# Patient Record
Sex: Male | Born: 1935 | Race: Black or African American | Hispanic: No | Marital: Single | Smoking: Former smoker
Health system: Southern US, Community
[De-identification: ages and names within clinical notes are randomized; demographics above are authoritative.]

## PROBLEM LIST (undated history)

## (undated) DIAGNOSIS — I739 Peripheral vascular disease, unspecified: Secondary | ICD-10-CM

## (undated) DIAGNOSIS — I10 Essential (primary) hypertension: Secondary | ICD-10-CM

## (undated) DIAGNOSIS — I509 Heart failure, unspecified: Secondary | ICD-10-CM

## (undated) DIAGNOSIS — I4891 Unspecified atrial fibrillation: Secondary | ICD-10-CM

## (undated) DIAGNOSIS — I499 Cardiac arrhythmia, unspecified: Secondary | ICD-10-CM

## (undated) DIAGNOSIS — M109 Gout, unspecified: Secondary | ICD-10-CM

## (undated) DIAGNOSIS — N186 End stage renal disease: Secondary | ICD-10-CM

## (undated) DIAGNOSIS — I639 Cerebral infarction, unspecified: Secondary | ICD-10-CM

## (undated) DIAGNOSIS — H409 Unspecified glaucoma: Secondary | ICD-10-CM

## (undated) DIAGNOSIS — I2510 Atherosclerotic heart disease of native coronary artery without angina pectoris: Secondary | ICD-10-CM

## (undated) DIAGNOSIS — D649 Anemia, unspecified: Secondary | ICD-10-CM

## (undated) DIAGNOSIS — H5440 Blindness, one eye, unspecified eye: Secondary | ICD-10-CM

## (undated) DIAGNOSIS — G629 Polyneuropathy, unspecified: Secondary | ICD-10-CM

## (undated) HISTORY — PX: EYE SURGERY: SHX253

## (undated) HISTORY — PX: VASCULAR SURGERY: SHX849

---
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
## 2006-10-05 ENCOUNTER — Other Ambulatory Visit: Payer: Self-pay

## 2006-10-05 ENCOUNTER — Emergency Department: Payer: Self-pay

## 2006-10-16 ENCOUNTER — Ambulatory Visit: Payer: Self-pay | Admitting: General Surgery

## 2006-10-21 ENCOUNTER — Ambulatory Visit: Payer: Self-pay | Admitting: Family Medicine

## 2006-10-31 ENCOUNTER — Ambulatory Visit: Payer: Self-pay | Admitting: Family Medicine

## 2006-11-26 ENCOUNTER — Ambulatory Visit: Payer: Self-pay | Admitting: Vascular Surgery

## 2006-12-03 ENCOUNTER — Inpatient Hospital Stay: Payer: Self-pay | Admitting: Vascular Surgery

## 2006-12-10 ENCOUNTER — Inpatient Hospital Stay: Payer: Self-pay | Admitting: Vascular Surgery

## 2007-02-03 ENCOUNTER — Ambulatory Visit: Payer: Self-pay | Admitting: Vascular Surgery

## 2007-02-12 ENCOUNTER — Inpatient Hospital Stay: Payer: Self-pay | Admitting: Vascular Surgery

## 2007-05-10 ENCOUNTER — Other Ambulatory Visit: Payer: Self-pay

## 2007-05-10 ENCOUNTER — Inpatient Hospital Stay: Payer: Self-pay | Admitting: Internal Medicine

## 2007-12-08 ENCOUNTER — Other Ambulatory Visit: Payer: Self-pay

## 2007-12-09 ENCOUNTER — Inpatient Hospital Stay: Payer: Self-pay | Admitting: Internal Medicine

## 2007-12-22 ENCOUNTER — Emergency Department: Payer: Self-pay | Admitting: Emergency Medicine

## 2007-12-22 ENCOUNTER — Other Ambulatory Visit: Payer: Self-pay

## 2008-12-31 ENCOUNTER — Inpatient Hospital Stay: Payer: Self-pay | Admitting: Internal Medicine

## 2011-05-10 ENCOUNTER — Emergency Department: Payer: Self-pay | Admitting: Internal Medicine

## 2011-05-15 ENCOUNTER — Emergency Department: Payer: Self-pay | Admitting: *Deleted

## 2013-08-27 ENCOUNTER — Ambulatory Visit: Payer: Self-pay | Admitting: Family

## 2013-12-22 ENCOUNTER — Ambulatory Visit: Payer: Self-pay | Admitting: Ophthalmology

## 2014-01-31 ENCOUNTER — Telehealth: Payer: Self-pay | Admitting: Medical Oncology

## 2014-01-31 NOTE — Telephone Encounter (Signed)
error 

## 2015-03-04 ENCOUNTER — Encounter: Payer: Self-pay | Admitting: Emergency Medicine

## 2015-03-04 ENCOUNTER — Emergency Department: Payer: Medicare (Managed Care)

## 2015-03-04 ENCOUNTER — Inpatient Hospital Stay
Admission: EM | Admit: 2015-03-04 | Discharge: 2015-03-11 | DRG: 291 | Disposition: A | Discharge status: Skilled Nursing Facility | Payer: Medicare (Managed Care) | Attending: Internal Medicine | Admitting: Internal Medicine

## 2015-03-04 DIAGNOSIS — E669 Obesity, unspecified: Secondary | ICD-10-CM | POA: Diagnosis present

## 2015-03-04 DIAGNOSIS — N184 Chronic kidney disease, stage 4 (severe): Secondary | ICD-10-CM | POA: Diagnosis present

## 2015-03-04 DIAGNOSIS — I129 Hypertensive chronic kidney disease with stage 1 through stage 4 chronic kidney disease, or unspecified chronic kidney disease: Secondary | ICD-10-CM | POA: Diagnosis present

## 2015-03-04 DIAGNOSIS — E785 Hyperlipidemia, unspecified: Secondary | ICD-10-CM | POA: Diagnosis present

## 2015-03-04 DIAGNOSIS — H409 Unspecified glaucoma: Secondary | ICD-10-CM | POA: Diagnosis present

## 2015-03-04 DIAGNOSIS — Z66 Do not resuscitate: Secondary | ICD-10-CM | POA: Diagnosis present

## 2015-03-04 DIAGNOSIS — N179 Acute kidney failure, unspecified: Secondary | ICD-10-CM | POA: Diagnosis present

## 2015-03-04 DIAGNOSIS — M1A9XX0 Chronic gout, unspecified, without tophus (tophi): Secondary | ICD-10-CM | POA: Diagnosis present

## 2015-03-04 DIAGNOSIS — R079 Chest pain, unspecified: Secondary | ICD-10-CM

## 2015-03-04 DIAGNOSIS — D631 Anemia in chronic kidney disease: Secondary | ICD-10-CM | POA: Diagnosis present

## 2015-03-04 DIAGNOSIS — Z8673 Personal history of transient ischemic attack (TIA), and cerebral infarction without residual deficits: Secondary | ICD-10-CM

## 2015-03-04 DIAGNOSIS — J9601 Acute respiratory failure with hypoxia: Secondary | ICD-10-CM | POA: Diagnosis present

## 2015-03-04 DIAGNOSIS — Z7952 Long term (current) use of systemic steroids: Secondary | ICD-10-CM | POA: Diagnosis not present

## 2015-03-04 DIAGNOSIS — I509 Heart failure, unspecified: Secondary | ICD-10-CM

## 2015-03-04 DIAGNOSIS — J189 Pneumonia, unspecified organism: Secondary | ICD-10-CM | POA: Diagnosis not present

## 2015-03-04 DIAGNOSIS — Z79899 Other long term (current) drug therapy: Secondary | ICD-10-CM | POA: Diagnosis not present

## 2015-03-04 DIAGNOSIS — Z87891 Personal history of nicotine dependence: Secondary | ICD-10-CM

## 2015-03-04 DIAGNOSIS — I5033 Acute on chronic diastolic (congestive) heart failure: Principal | ICD-10-CM | POA: Diagnosis present

## 2015-03-04 DIAGNOSIS — E872 Acidosis: Secondary | ICD-10-CM | POA: Diagnosis present

## 2015-03-04 DIAGNOSIS — Z6830 Body mass index (BMI) 30.0-30.9, adult: Secondary | ICD-10-CM | POA: Diagnosis not present

## 2015-03-04 DIAGNOSIS — Z7982 Long term (current) use of aspirin: Secondary | ICD-10-CM | POA: Diagnosis not present

## 2015-03-04 DIAGNOSIS — G629 Polyneuropathy, unspecified: Secondary | ICD-10-CM | POA: Diagnosis present

## 2015-03-04 DIAGNOSIS — Z7902 Long term (current) use of antithrombotics/antiplatelets: Secondary | ICD-10-CM

## 2015-03-04 DIAGNOSIS — R0902 Hypoxemia: Secondary | ICD-10-CM

## 2015-03-04 DIAGNOSIS — I2510 Atherosclerotic heart disease of native coronary artery without angina pectoris: Secondary | ICD-10-CM | POA: Diagnosis present

## 2015-03-04 HISTORY — DX: Heart failure, unspecified: I50.9

## 2015-03-04 HISTORY — DX: Essential (primary) hypertension: I10

## 2015-03-04 HISTORY — DX: Polyneuropathy, unspecified: G62.9

## 2015-03-04 HISTORY — DX: Gout, unspecified: M10.9

## 2015-03-04 HISTORY — DX: Unspecified glaucoma: H40.9

## 2015-03-04 LAB — COMPREHENSIVE METABOLIC PANEL
ALK PHOS: 89 U/L (ref 38–126)
ALT: 20 U/L (ref 17–63)
ANION GAP: 10 (ref 5–15)
AST: 28 U/L (ref 15–41)
Albumin: 3.4 g/dL — ABNORMAL LOW (ref 3.5–5.0)
BILIRUBIN TOTAL: 0.8 mg/dL (ref 0.3–1.2)
BUN: 51 mg/dL — ABNORMAL HIGH (ref 6–20)
CALCIUM: 8.5 mg/dL — AB (ref 8.9–10.3)
CO2: 25 mmol/L (ref 22–32)
CREATININE: 4.24 mg/dL — AB (ref 0.61–1.24)
Chloride: 108 mmol/L (ref 101–111)
GFR, EST AFRICAN AMERICAN: 14 mL/min — AB (ref >60)
GFR, EST NON AFRICAN AMERICAN: 12 mL/min — AB (ref >60)
Glucose, Bld: 116 mg/dL — ABNORMAL HIGH (ref 65–99)
Potassium: 4.9 mmol/L (ref 3.5–5.1)
Sodium: 143 mmol/L (ref 135–145)
TOTAL PROTEIN: 7.9 g/dL (ref 6.5–8.1)

## 2015-03-04 LAB — CBC WITH DIFFERENTIAL/PLATELET
BASOS ABS: 0.1 10*3/uL (ref 0–0.1)
BASOS PCT: 2 %
EOS ABS: 0.3 10*3/uL (ref 0–0.7)
EOS PCT: 3 %
HCT: 35.4 % — ABNORMAL LOW (ref 40.0–52.0)
Hemoglobin: 11.2 g/dL — ABNORMAL LOW (ref 13.0–18.0)
LYMPHS ABS: 0.7 10*3/uL — AB (ref 1.0–3.6)
Lymphocytes Relative: 9 %
MCH: 28.6 pg (ref 26.0–34.0)
MCHC: 31.6 g/dL — ABNORMAL LOW (ref 32.0–36.0)
MCV: 90.5 fL (ref 80.0–100.0)
Monocytes Absolute: 0.4 10*3/uL (ref 0.2–1.0)
Monocytes Relative: 6 %
Neutro Abs: 6.2 10*3/uL (ref 1.4–6.5)
Neutrophils Relative %: 80 %
PLATELETS: 167 10*3/uL (ref 150–440)
RBC: 3.91 MIL/uL — AB (ref 4.40–5.90)
RDW: 17 % — ABNORMAL HIGH (ref 11.5–14.5)
WBC: 7.7 10*3/uL (ref 3.8–10.6)

## 2015-03-04 LAB — BLOOD GAS, VENOUS
Acid-base deficit: 0.1 mmol/L (ref 0.0–2.0)
BICARBONATE: 26.8 meq/L (ref 21.0–28.0)
FIO2: 0.21
O2 Saturation: 65.1 %
PATIENT TEMPERATURE: 37
pCO2, Ven: 52 mmHg (ref 44.0–60.0)
pH, Ven: 7.32 (ref 7.320–7.430)
pO2, Ven: 37 mmHg (ref 30.0–45.0)

## 2015-03-04 LAB — TROPONIN I: Troponin I: <0.03 ng/mL (ref <0.031)

## 2015-03-04 LAB — LACTIC ACID, PLASMA: LACTIC ACID, VENOUS: 0.8 mmol/L (ref 0.5–2.0)

## 2015-03-04 LAB — BRAIN NATRIURETIC PEPTIDE: B NATRIURETIC PEPTIDE 5: 560 pg/mL — AB (ref 0.0–100.0)

## 2015-03-04 MED ORDER — CLOPIDOGREL BISULFATE 75 MG PO TABS
75.0000 mg | ORAL_TABLET | Freq: Every day | ORAL | Status: DC
  Administered 2015-03-05 – 2015-03-11 (×7): 75 mg via ORAL
  Filled 2015-03-04 (×7): qty 1

## 2015-03-04 MED ORDER — ASPIRIN EC 81 MG PO TBEC
81.0000 mg | DELAYED_RELEASE_TABLET | Freq: Every day | ORAL | Status: DC
  Administered 2015-03-05 – 2015-03-11 (×7): 81 mg via ORAL
  Filled 2015-03-04 (×7): qty 1

## 2015-03-04 MED ORDER — FERROUS SULFATE 325 (65 FE) MG PO TABS
325.0000 mg | ORAL_TABLET | Freq: Two times a day (BID) | ORAL | Status: DC
  Administered 2015-03-04 – 2015-03-11 (×14): 325 mg via ORAL
  Filled 2015-03-04 (×15): qty 1

## 2015-03-04 MED ORDER — ALLOPURINOL 100 MG PO TABS
100.0000 mg | ORAL_TABLET | Freq: Every day | ORAL | Status: DC
  Administered 2015-03-05 – 2015-03-11 (×7): 100 mg via ORAL
  Filled 2015-03-04 (×7): qty 1

## 2015-03-04 MED ORDER — TOLNAFTATE 1 % EX AERP: 1.0000 "application " | INHALATION_SPRAY | Freq: Every day | CUTANEOUS | Status: DC

## 2015-03-04 MED ORDER — AMLODIPINE-ATORVASTATIN 10-20 MG PO TABS: 1.0000 | ORAL_TABLET | Freq: Every day | ORAL | Status: DC

## 2015-03-04 MED ORDER — ONDANSETRON HCL 4 MG PO TABS: 4.0000 mg | ORAL_TABLET | Freq: Four times a day (QID) | ORAL | Status: DC | PRN

## 2015-03-04 MED ORDER — ONDANSETRON HCL 4 MG/2ML IJ SOLN: 4.0000 mg | Freq: Four times a day (QID) | INTRAMUSCULAR | Status: DC | PRN

## 2015-03-04 MED ORDER — ATORVASTATIN CALCIUM 20 MG PO TABS
20.0000 mg | ORAL_TABLET | Freq: Every day | ORAL | Status: DC
  Administered 2015-03-04 – 2015-03-05 (×2): 20 mg via ORAL
  Filled 2015-03-04 (×2): qty 1

## 2015-03-04 MED ORDER — HEPARIN SODIUM (PORCINE) 5000 UNIT/ML IJ SOLN
5000.0000 [IU] | Freq: Three times a day (TID) | INTRAMUSCULAR | Status: DC
  Administered 2015-03-04 – 2015-03-11 (×21): 5000 [IU] via SUBCUTANEOUS
  Filled 2015-03-04 (×20): qty 1

## 2015-03-04 MED ORDER — PREGABALIN 75 MG PO CAPS
75.0000 mg | ORAL_CAPSULE | Freq: Two times a day (BID) | ORAL | Status: DC
  Administered 2015-03-04 – 2015-03-11 (×14): 75 mg via ORAL
  Filled 2015-03-04 (×14): qty 1

## 2015-03-04 MED ORDER — CETYLPYRIDINIUM CHLORIDE 0.05 % MT LIQD
7.0000 mL | Freq: Two times a day (BID) | OROMUCOSAL | Status: DC
  Administered 2015-03-04 – 2015-03-11 (×9): 7 mL via OROMUCOSAL

## 2015-03-04 MED ORDER — ADULT MULTIVITAMIN W/MINERALS CH
ORAL_TABLET | Freq: Every day | ORAL | Status: DC
  Administered 2015-03-05 – 2015-03-11 (×7): 1 via ORAL
  Filled 2015-03-04 (×7): qty 1

## 2015-03-04 MED ORDER — METOPROLOL SUCCINATE ER 100 MG PO TB24
200.0000 mg | ORAL_TABLET | Freq: Every day | ORAL | Status: DC
  Administered 2015-03-05 – 2015-03-11 (×7): 200 mg via ORAL
  Filled 2015-03-04 (×7): qty 2

## 2015-03-04 MED ORDER — DEXTROSE 5 % IV SOLN
1.0000 g | Freq: Once | INTRAVENOUS | Status: AC
  Administered 2015-03-04: 1 g via INTRAVENOUS
  Filled 2015-03-04: qty 10

## 2015-03-04 MED ORDER — FUROSEMIDE 10 MG/ML IJ SOLN
60.0000 mg | Freq: Two times a day (BID) | INTRAMUSCULAR | Status: DC
  Administered 2015-03-04 – 2015-03-06 (×4): 60 mg via INTRAVENOUS
  Filled 2015-03-04 (×4): qty 6

## 2015-03-04 MED ORDER — ISOSORBIDE MONONITRATE ER 60 MG PO TB24
60.0000 mg | ORAL_TABLET | Freq: Every day | ORAL | Status: DC
  Administered 2015-03-05 – 2015-03-11 (×7): 60 mg via ORAL
  Filled 2015-03-04 (×7): qty 1

## 2015-03-04 MED ORDER — ACETAMINOPHEN 325 MG PO TABS: 650.0000 mg | ORAL_TABLET | Freq: Four times a day (QID) | ORAL | Status: DC | PRN

## 2015-03-04 MED ORDER — SENNOSIDES-DOCUSATE SODIUM 8.6-50 MG PO TABS
2.0000 | ORAL_TABLET | Freq: Every day | ORAL | Status: DC
  Administered 2015-03-05 – 2015-03-10 (×6): 2 via ORAL
  Filled 2015-03-04 (×7): qty 2

## 2015-03-04 MED ORDER — TROLAMINE SALICYLATE 10 % EX CREA
1.0000 "application " | TOPICAL_CREAM | Freq: Four times a day (QID) | CUTANEOUS | Status: DC
  Administered 2015-03-07 – 2015-03-08 (×3): 1 via TOPICAL
  Filled 2015-03-04: qty 85

## 2015-03-04 MED ORDER — ACETAMINOPHEN 650 MG RE SUPP: 650.0000 mg | Freq: Four times a day (QID) | RECTAL | Status: DC | PRN

## 2015-03-04 MED ORDER — DEXTROSE 5 % IV SOLN
500.0000 mg | Freq: Once | INTRAVENOUS | Status: AC
  Administered 2015-03-04: 500 mg via INTRAVENOUS
  Filled 2015-03-04: qty 500

## 2015-03-04 MED ORDER — FUROSEMIDE 10 MG/ML IJ SOLN
40.0000 mg | Freq: Once | INTRAMUSCULAR | Status: AC
  Administered 2015-03-04: 40 mg via INTRAVENOUS
  Filled 2015-03-04: qty 4

## 2015-03-04 MED ORDER — LATANOPROST 0.005 % OP SOLN
1.0000 [drp] | Freq: Every day | OPHTHALMIC | Status: DC
  Administered 2015-03-04 – 2015-03-10 (×7): 1 [drp] via OPHTHALMIC
  Filled 2015-03-04: qty 2.5

## 2015-03-04 MED ORDER — SODIUM CHLORIDE 0.9 % IJ SOLN
3.0000 mL | Freq: Two times a day (BID) | INTRAMUSCULAR | Status: DC
  Administered 2015-03-04 – 2015-03-11 (×14): 3 mL via INTRAVENOUS

## 2015-03-04 MED ORDER — AMLODIPINE BESYLATE 10 MG PO TABS
10.0000 mg | ORAL_TABLET | Freq: Every day | ORAL | Status: DC
  Administered 2015-03-05: 10 mg via ORAL
  Filled 2015-03-04: qty 1

## 2015-03-04 MED ORDER — SODIUM BICARBONATE 650 MG PO TABS
650.0000 mg | ORAL_TABLET | Freq: Two times a day (BID) | ORAL | Status: DC
  Administered 2015-03-04 – 2015-03-11 (×14): 650 mg via ORAL
  Filled 2015-03-04 (×15): qty 1

## 2015-03-04 MED ORDER — HYDRALAZINE HCL 25 MG PO TABS
75.0000 mg | ORAL_TABLET | Freq: Three times a day (TID) | ORAL | Status: DC
  Administered 2015-03-04 – 2015-03-11 (×21): 75 mg via ORAL
  Filled 2015-03-04 (×21): qty 1

## 2015-03-04 MED ORDER — BRIMONIDINE TARTRATE 0.2 % OP SOLN
1.0000 [drp] | Freq: Two times a day (BID) | OPHTHALMIC | Status: DC
  Administered 2015-03-04 – 2015-03-11 (×14): 1 [drp] via OPHTHALMIC
  Filled 2015-03-04: qty 5

## 2015-03-04 NOTE — Progress Notes (Signed)
Patient admitted today from home alone. Is [facility] by [facility] where he goes every Monday and Friday, and also has nurses aides come to his house. Per patient's sister he will soon need to be on dialysis due to CKD. Patient is alert and oriented, but has some memory impairment. Sinus tach in low 100's with PVC's on tele. On 5L of oxygen currently sating around 92%. No complaints of pain. Heavy assist to stand to [facility], will need PT eval. Will continue to monitor.

## 2015-03-04 NOTE — H&P (Signed)
[facility] - [place] at [facility]   PATIENT NAME: Sean Delgado    MR#:  [id number]  DATE OF BIRTH:  [birth year]  DATE OF ADMISSION:  03/04/2015  PRIMARY CARE PHYSICIAN: Bobbye Morton, MD   REQUESTING/REFERRING PHYSICIAN: Dr. Governor Rooks  CHIEF COMPLAINT:   Chief Complaint  Patient presents with  . Shortness of Breath    HISTORY OF PRESENT ILLNESS:  Sean Delgado  is a 81 y.o. male with a known history of CAD daily stage IV, hypertension, history of CHF, gout, glaucoma, peripheral neuropathy, who presents to the hospital due to shortness of breath and worsening lower extremity edema. He was followed by the [facility] and presents to the hospital due to the above complaint. Patient says he develop shortness of breath yesterday and has progressively gotten worse. He admits to a cough which is nonproductive. He denies any fever, chills, nausea, vomiting. He denies any chest pain, abdominal pain or any other associated symptoms presently. Patient presented to the ER was noted to be hypoxic with O2 sats in the low 90s to high 80s. Patient was noted to be volume overloaded noted to be in mild CHF and hospitalist services were contacted further treatment and evaluation.  PAST MEDICAL HISTORY:   Past Medical History  Diagnosis Date  . Chronic renal failure, stage 4 (severe)   . Hypertension   . CHF (congestive heart failure)   . Gout   . Glaucoma   . Neuropathy     PAST SURGICAL HISTORY:  History reviewed. No pertinent past surgical history.  SOCIAL HISTORY:   Social History  Substance Use Topics  . Smoking status: Former Smoker -- 0.25 packs/day for 15 years    Types: Cigarettes  . Smokeless tobacco: Not on file  . Alcohol Use: No    FAMILY HISTORY:   Family History  Problem Relation Age of Onset  . Diabetes Mother     DRUG ALLERGIES:  No Known Allergies  REVIEW OF SYSTEMS:   Review of Systems  Constitutional: Negative for fever and  weight loss.  HENT: Negative for congestion, nosebleeds and tinnitus.   Eyes: Negative for blurred vision, double vision and redness.  Respiratory: Positive for cough and shortness of breath. Negative for hemoptysis.   Cardiovascular: Positive for leg swelling. Negative for chest pain, orthopnea and PND.  Gastrointestinal: Negative for nausea, vomiting, abdominal pain, diarrhea and melena.  Genitourinary: Negative for dysuria, urgency and hematuria.  Musculoskeletal: Negative for joint pain and falls.  Neurological: Negative for dizziness, tingling, sensory change, focal weakness, seizures, weakness and headaches.  Endo/Heme/Allergies: Negative for polydipsia. Does not bruise/bleed easily.  Psychiatric/Behavioral: Negative for depression and memory loss. The patient is not nervous/anxious.     MEDICATIONS AT HOME:   Prior to Admission medications   Medication Sig Start Date End Date Taking? Authorizing Provider  acetaminophen (TYLENOL) 325 MG tablet Take 650 mg by mouth every 6 (six) hours as needed. For pain.   Yes Historical Provider, MD  allopurinol (ZYLOPRIM) 100 MG tablet Take 100 mg by mouth daily.   Yes Historical Provider, MD  amlodipine-atorvastatin (CADUET) 10-20 MG per tablet Take 1 tablet by mouth daily.   Yes Historical Provider, MD  aspirin EC 81 MG tablet Take 81 mg by mouth daily.   Yes Historical Provider, MD  brimonidine (ALPHAGAN) 0.2 % ophthalmic solution Place 1 drop into the left eye 2 (two) times daily.   Yes Historical Provider, MD  clopidogrel (PLAVIX) 75 MG tablet Take  75 mg by mouth daily.   Yes Historical Provider, MD  ferrous sulfate 325 (65 FE) MG tablet Take 325 mg by mouth 2 (two) times daily.   Yes Historical Provider, MD  furosemide (LASIX) 40 MG tablet Take 40 mg by mouth 2 (two) times daily.   Yes Historical Provider, MD  hydrALAZINE (APRESOLINE) 50 MG tablet Take 75 mg by mouth 3 (three) times daily.   Yes Historical Provider, MD  isosorbide mononitrate  (IMDUR) 60 MG 24 hr tablet Take 60 mg by mouth daily.   Yes Historical Provider, MD  latanoprost (XALATAN) 0.005 % ophthalmic solution Place 1 drop into both eyes at bedtime.   Yes Historical Provider, MD  metoprolol (TOPROL-XL) 200 MG 24 hr tablet Take 200 mg by mouth daily.   Yes Historical Provider, MD  Multiple Vitamins-Minerals (CENTRUM SILVER PO) Take 1 tablet by mouth daily.   Yes Historical Provider, MD  pregabalin (LYRICA) 75 MG capsule Take 75 mg by mouth 2 (two) times daily.   Yes Historical Provider, MD  senna-docusate (SENOKOT-S) 8.6-50 MG per tablet Take 2 tablets by mouth at bedtime.   Yes Historical Provider, MD  Skin Protectants, Misc. (A+D FIRST AID) OINT Apply 1 application topically daily. Apply to feet   Yes Historical Provider, MD  sodium bicarbonate 650 MG tablet Take 650 mg by mouth 2 (two) times daily.   Yes Historical Provider, MD  sodium polystyrene (KAYEXALATE) 15 GM/60ML suspension Take 60 mLs by mouth 2 (two) times a week. Take on Monday and Friday. 04/25/14  Yes Historical Provider, MD  tolnaftate (TINACTIN) 1 % spray Apply 1 application topically daily. Spray on both feet   Yes Historical Provider, MD  Trolamine Salicylate 10 % LOTN Apply 1 application topically 4 (four) times daily. May apply this medication up to 4 times a day.   Yes Historical Provider, MD      VITAL SIGNS:  Blood pressure 127/82, pulse 101, temperature 98.3 F (36.8 C), temperature source Oral, resp. rate 19, height 6\' 2"  (1.88 m), weight 115.667 kg (255 lb), SpO2 90 %.  PHYSICAL EXAMINATION:  Physical Exam  GENERAL:  81 y.o.-year-old obese patient lying in the bed in mild respiratory distress  EYES: Pupils equal, round, reactive to light and accommodation. No scleral icterus. Extraocular muscles intact.  HEENT: Head atraumatic, normocephalic. Oropharynx and nasopharynx clear. No oropharyngeal erythema, moist oral mucosa  NECK:  Supple, no jugular venous distention. No thyroid enlargement,  no tenderness.  LUNGS: Normal breath sounds bilaterally, no wheezing, + basilar rales, no rhonchi. Positive use of accessory muscles.  CARDIOVASCULAR: S1, S2 RRR. No murmurs, rubs, gallops, clicks.  ABDOMEN: Soft, nontender, nondistended. Bowel sounds present. No organomegaly or mass.  EXTREMITIES: +1-2 pitting edema from the knees to ankles bilaterally, No cyanosis, or clubbing. + 2 pedal & radial pulses b/l.   NEUROLOGIC: Cranial nerves II through XII are intact. No focal Motor or sensory deficits appreciated b/l.  PSYCHIATRIC: The patient is alert and oriented x 3. Good affect.  SKIN: No obvious rash, lesion, or ulcer.   LABORATORY PANEL:   CBC  Recent Labs Lab 03/04/15 1212  WBC 7.7  HGB 11.2*  HCT 35.4*  PLT 167   ------------------------------------------------------------------------------------------------------------------  Chemistries   Recent Labs Lab 03/04/15 1212  NA 143  K 4.9  CL 108  CO2 25  GLUCOSE 116*  BUN 51*  CREATININE 4.24*  CALCIUM 8.5*  AST 28  ALT 20  ALKPHOS 89  BILITOT 0.8   ------------------------------------------------------------------------------------------------------------------  Cardiac Enzymes  Recent Labs Lab 03/04/15 1212  TROPONINI <0.03   ------------------------------------------------------------------------------------------------------------------  RADIOLOGY:  Dg Chest 2 View  03/04/2015   CLINICAL DATA:  Shortness of breath  EXAM: CHEST  2 VIEW  COMPARISON:  12/31/2008  FINDINGS: Grossly unchanged borderline enlarged cardiac silhouette and mediastinal contours with atherosclerotic plaque within the thoracic aorta. Pulmonary vasculature appears less distinct than present examination with cephalization of flow. Interval development small bilateral effusions with associated worsening bibasilar heterogeneous/consolidative opacities, left greater than right. No pneumothorax. No definite acute osseous abnormalities.   IMPRESSION: Findings worrisome for pulmonary edema with small bilateral effusions and associated bibasilar opacities, left greater than right, atelectasis versus infiltrate.   Electronically Signed   By: Simonne Come M.D.   On: 03/04/2015 12:08     IMPRESSION AND PLAN:   81 year old male with past medical history of chronic kidney disease stage IV, hypertension, gout, history of CHF, neuropathy, glaucoma who presents to the hospital with shortness of breath and worsening lower extremity edema noted to be in CHF.  #1 CHF-acute on chronic diastolic dysfunction. -We'll diurese the patient with IV Lasix will given the high dose given his CKD and follow I's and O's and daily weights. -Continue metoprolol, Imdur, hydralazine. -I will get a two-dimensional echocardiogram.  #2 CKD stage IV-patient is followed by [facility] nephrology. -No acute indication for hemodialysis presently. Will follow BUN/creatinine with IV diuresis. [facility] consult nephrology.  #3 history of gout-no acute attack continue allopurinol.  #4 chronic anemia-due to CKD.  Continue iron supplements.  #5 glaucoma-continue his latanoprost, brimonidine eyedrops  #6 neuropathy-continue Lyrica    All the records are reviewed and case discussed with ED provider. Management plans discussed with the patient, family and they are in agreement.  CODE STATUS: DO NOT RESUSCITATE  TOTAL TIME TAKING CARE OF THIS PATIENT: 50 minutes.    Houston Siren M.D on 03/04/2015 at 2:47 PM  Between 7am to 6pm - Pager - [phone]  After 6pm go to [email or web address] - password [email or web address]  [place] [facility]  Office  [phone]  CC: Primary care physician; Bobbye Morton, MD

## 2015-03-04 NOTE — ED Notes (Signed)
Patient to ED via EMS from home, patient called due to shortness of breath, reporting that breathing had gotten worse since yesterday, with some productive cough. Patient found to have room air oxygen sat of 80% on 2L oxygen up to low 90s.

## 2015-03-04 NOTE — ED Provider Notes (Signed)
[facility] Emergency Department Provider Note   ____________________________________________  Time seen: 12:10 PM I have reviewed the triage vital signs and the triage nursing note.  HISTORY  Chief Complaint Shortness of Breath   Historian Patient, who is a poor historian, and his niece  HPI Sean Delgado. is a 81 y.o. male who lives at home alone but it tends to [facility] on Monday and Fridays, who called his niece this morning to say that he was short of breath and wanted to go the hospital for evaluation. No reported history of congestive heart failure per the patient although he does seem to be a bit of a poor historian. Report some mild cough but no fever. No chest pain. No abdominal pain. He hasn't noticed any lower extremity edema although it's clearly there. Symptoms are considered moderate. EMS arrived and his O2 sat on room air was in the 80s, and he does not wear any home O2.    Past Medical History  Diagnosis Date  . Renal disorder   . Seizures     There are no active problems to display for this patient.   History reviewed. No pertinent past surgical history.  No current outpatient prescriptions on file.  Allergies Review of patient's allergies indicates no known allergies.  History reviewed. No pertinent family history.  Social History Social History  Substance Use Topics  . Smoking status: Never Smoker   . Smokeless tobacco: None  . Alcohol Use: No   he [facility]ends [facility]  Review of Systems  Constitutional: Negative for fever. Eyes: Negative for visual changes. ENT: Negative for sore throat. Cardiovascular: Negative for chest pain. Respiratory: Positive for shortness of breath. Gastrointestinal: Negative for abdominal pain, vomiting and diarrhea. Genitourinary: Negative for dysuria. Musculoskeletal: Negative for back pain. Skin: Negative for rash. Neurological: Negative for headache. 10 point Review of Systems  otherwise negative ____________________________________________   PHYSICAL EXAM:  VITAL SIGNS: ED Triage Vitals  Enc Vitals Group     BP 03/04/15 1140 155/81 mmHg     Pulse Rate 03/04/15 1140 106     Resp 03/04/15 1140 22     Temp 03/04/15 1140 98.3 F (36.8 C)     Temp Source 03/04/15 1140 Oral     SpO2 --      Weight 03/04/15 1141 255 lb (115.667 kg)     Height 03/04/15 1141  (1.88 m)     Head Cir --      Peak Flow --      Pain Score --      Pain Loc --      Pain Edu? --      Excl. in GC? --      Constitutional: Alert and oriented. Well appearing and in no distress. Eyes: Conjunctivae are normal. PERRL. Normal extraocular movements. ENT   Head: Normocephalic and atraumatic.   Nose: No congestion/rhinnorhea.   Mouth/Throat: Mucous membranes are moist.   Neck: No stridor. Cardiovascular/Chest: Tachycardic and regular..  No murmurs, rubs, or gallops. Respiratory: No tachypnea, however he does have some use of abdominal wall muscles for breathing. Rhonchi bilateral bases. No wheezing. Gastrointestinal: Soft. No distention, no guarding, no rebound. Nontender . Obese  Genitourinary/rectal:Deferred Musculoskeletal: Nontender normal range of motion in all extremities. 3+ pain in the bilateral lower extremities from the feet all her to the groin. Neurologic:  Normal speech and language. No gross or focal neurologic deficits are appreciated. Skin:  Skin is warm, dry and intact. No  rash noted. Psychiatric: Mood and affect are normal. Speech and behavior are normal. Patient exhibits appropriate insight and judgment.  ____________________________________________   EKG I, Governor Rooks, MD, the attending physician have personally viewed and interpreted all ECGs.  106 bpm. Sinus tachycardia. Occasional PVC. Narrow QS. Normal axis. Nonspecific T wave. ____________________________________________  LABS (pertinent positives/negatives)  Penis was asked within  normal limits Lactate pending Complete metabolic panel significant for BUN 51 and creatinine of 4.24 Troponin less than 0.03 CBC shows a white blood count of 7.7 and hemoglobin 11.2 platelet count 167 and no left shift BNP significant for 560  ____________________________________________  RADIOLOGY All Xrays were viewed by me. Imaging interpreted by Radiologist.  Chest x-ray two-view:   IMPRESSION: Findings worrisome for pulmonary edema with small bilateral effusions and associated bibasilar opacities, left greater than right, atelectasis versus infiltrate.  __________________________________________  PROCEDURES  Procedure(s) performed: None  Critical Care performed: CRITICAL CARE Performed by: Governor Rooks   Total critical care time: 30 minutes  Critical care time was exclusive of separately billable procedures and treating other patients.  Critical care was necessary to treat or prevent imminent or life-threatening deterioration.  Critical care was time spent personally by me on the following activities: development of treatment plan with patient and/or surrogate as well as nursing, discussions with consultants, evaluation of patient's response to treatment, examination of patient, obtaining history from patient or surrogate, ordering and performing treatments and interventions, ordering and review of laboratory studies, ordering and review of radiographic studies, pulse oximetry and re-evaluation of patient's condition.   ____________________________________________   ED COURSE / ASSESSMENT AND PLAN  CONSULTATIONS: Face-to-face with hospitalist for admission  Pertinent labs & imaging results that were available during my care of the patient were reviewed by me and considered in my medical decision making (see chart for details).  Patient arrived with a significant hypoxia, he is 90% on 4 L and having some abdominal breathing.  [name] clinical symptoms seem  most consistent with congestive heart failure exacerbation/new onset clinically, however pneumonia/infection is within the differential. His checks x-ray showed some mismatch from side to side raising concern for infectious etiology. I am going to go ahead and cover him for community acquired pneumonia with Rocephin and azithromycin IV.  Consideration for possible sepsis due to pain acquired pneumonia and tachycardia. 30 cc/kg was not given in fluid resuscitation and the fact of the patient's active congestive heart failure exacerbation in fact I think the congestive heart failure is the likely largest contrary to his respiratory distress.  ----------------------------------------- 1:41 PM on 03/04/2015 -----------------------------------------  Laboratory evaluation shows no elevated white blood cell count or left shift, making me less concerned for pneumonia/sepsis. I'm suspicious most of congestive heart failure. I'm to start treating him with a dose of Lasix.  Patient / Family / Caregiver informed of clinical course, medical decision-making process, and agree with plan.    ___________________________________________   FINAL CLINICAL IMPRESSION(S) / ED DIAGNOSES   Final diagnoses:  Hypoxia  Acute exacerbation of CHF (congestive heart failure)  Community acquired pneumonia       Governor Rooks, MD 03/04/15 1417

## 2015-03-05 ENCOUNTER — Inpatient Hospital Stay
Admit: 2015-03-05 | Discharge: 2015-03-05 | Disposition: A | Discharge status: Home / Self Care | Payer: Medicare (Managed Care) | Attending: Specialist | Admitting: Specialist

## 2015-03-05 LAB — BASIC METABOLIC PANEL
ANION GAP: 8 (ref 5–15)
BUN: 51 mg/dL — ABNORMAL HIGH (ref 6–20)
CALCIUM: 8.3 mg/dL — AB (ref 8.9–10.3)
CO2: 25 mmol/L (ref 22–32)
Chloride: 110 mmol/L (ref 101–111)
Creatinine, Ser: 4.2 mg/dL — ABNORMAL HIGH (ref 0.61–1.24)
GFR, EST AFRICAN AMERICAN: 14 mL/min — AB (ref >60)
GFR, EST NON AFRICAN AMERICAN: 12 mL/min — AB (ref >60)
GLUCOSE: 99 mg/dL (ref 65–99)
Potassium: 4.8 mmol/L (ref 3.5–5.1)
SODIUM: 143 mmol/L (ref 135–145)

## 2015-03-05 LAB — CBC
HCT: 33.4 % — ABNORMAL LOW (ref 40.0–52.0)
HEMOGLOBIN: 10.3 g/dL — AB (ref 13.0–18.0)
MCH: 28.1 pg (ref 26.0–34.0)
MCHC: 30.9 g/dL — AB (ref 32.0–36.0)
MCV: 90.9 fL (ref 80.0–100.0)
Platelets: 145 10*3/uL — ABNORMAL LOW (ref 150–440)
RBC: 3.67 MIL/uL — AB (ref 4.40–5.90)
RDW: 16.3 % — ABNORMAL HIGH (ref 11.5–14.5)
WBC: 6.8 10*3/uL (ref 3.8–10.6)

## 2015-03-05 LAB — TROPONIN I

## 2015-03-05 NOTE — Progress Notes (Signed)
*  PRELIMINARY RESULTS* Echocardiogram 2D Echocardiogram has been performed.  Sean Delgado 03/05/2015, 8:24 AM

## 2015-03-05 NOTE — Progress Notes (Signed)
[facility] - [place] at [facility]   PATIENT NAME: Sean Delgado    MR#:  [id number]  DATE OF BIRTH:  1933/09/18  SUBJECTIVE:  CHIEF COMPLAINT:   Chief Complaint  Patient presents with  . Shortness of Breath   States that his breathing has improved, however he is still on 5 L. Nasal cannula with some dyspnea at rest. Not on oxygen at home. No chest pain, nausea vomiting or diaphoresis  REVIEW OF SYSTEMS:   Review of Systems  Constitutional: Negative for fever.  Respiratory: Positive for cough, shortness of breath and wheezing.   Cardiovascular: Negative for chest pain and palpitations.  Gastrointestinal: Negative for nausea, vomiting and abdominal pain.  Genitourinary: Negative for dysuria.    DRUG ALLERGIES:  No Known Allergies  VITALS:  Blood pressure 135/77, pulse 113, temperature 98.4 F (36.9 C), temperature source Oral, resp. rate 22, height  (1.88 m), weight 115.667 kg (255 lb), SpO2 94 %.  PHYSICAL EXAMINATION:  GENERAL:  81 y.o.-year-old patient lying in the bed with no acute distress.  EYES: Pupils equal, round, reactive to light and accommodation. No scleral icterus. Extraocular muscles intact.  HEENT: Head atraumatic, normocephalic. Oropharynx and nasopharynx clear.  NECK:  Supple, no jugular venous distention. No thyroid enlargement, no tenderness.  LUNGS: Diffuse crackles and wheezes, no overt distress or use of accessory muscles CARDIOVASCULAR: S1, S2 normal. No murmurs, rubs, or gallops.  ABDOMEN: Soft, nontender, nondistended. Bowel sounds present. No organomegaly or mass.  EXTREMITIES: No pedal edema, cyanosis, or clubbing.  NEUROLOGIC: Cranial nerves II through XII are intact. Muscle strength 5/5 in all extremities. Sensation intact. Gait not checked.  PSYCHIATRIC: The patient is alert and oriented x 3.  SKIN: No obvious rash, lesion, or ulcer.    LABORATORY PANEL:   CBC  Recent Labs Lab 03/05/15 0007  WBC 6.8  HGB  10.3*  HCT 33.4*  PLT 145*   ------------------------------------------------------------------------------------------------------------------  Chemistries   Recent Labs Lab 03/04/15 1212 03/05/15 0007  NA 143 143  K 4.9 4.8  CL 108 110  CO2 25 25  GLUCOSE 116* 99  BUN 51* 51*  CREATININE 4.24* 4.20*  CALCIUM 8.5* 8.3*  AST 28  --   ALT 20  --   ALKPHOS 89  --   BILITOT 0.8  --    ------------------------------------------------------------------------------------------------------------------  Cardiac Enzymes  Recent Labs Lab 03/05/15 0007  TROPONINI <0.03   ------------------------------------------------------------------------------------------------------------------  RADIOLOGY:  Dg Chest 2 View  03/04/2015   CLINICAL DATA:  Shortness of breath  EXAM: CHEST  2 VIEW  COMPARISON:  12/31/2008  FINDINGS: Grossly unchanged borderline enlarged cardiac silhouette and mediastinal contours with atherosclerotic plaque within the thoracic aorta. Pulmonary vasculature appears less distinct than present examination with cephalization of flow. Interval development small bilateral effusions with associated worsening bibasilar heterogeneous/consolidative opacities, left greater than right. No pneumothorax. No definite acute osseous abnormalities.  IMPRESSION: Findings worrisome for pulmonary edema with small bilateral effusions and associated bibasilar opacities, left greater than right, atelectasis versus infiltrate.   Electronically Signed   By: Simonne Come M.D.   On: 03/04/2015 12:08    EKG:   Orders placed or performed during the hospital encounter of 03/04/15  . ED EKG  . ED EKG    ASSESSMENT AND PLAN:    81 year old male with past medical history of chronic kidney disease stage IV, hypertension, gout, history of CHF, neuropathy, glaucoma who presents to the hospital with shortness of breath and worsening lower extremity  edema noted to be in CHF.  #1 CHF-acute on  chronic diastolic dysfunction. - Echo today shows fairly normal ejection fraction of 55-60% -Continue IV Lasix 60 mg twice a day - Continue I's and O's and daily weights. -800 cc yesterday, -600 today, weight is pending for today -Continue metoprolol, Imdur, hydralazine.  #2 CKD stage IV-patient is followed by [facility] nephrology. -No acute indication for hemodialysis presently. Will follow BUN/creatinine with IV diuresis. -We'll consult [facility] nephrology on Monday  #3 history of gout-no acute attack continue allopurinol.  #4 chronic anemia-due to CKD. Continue iron supplements.  #5 glaucoma-continue his latanoprost, brimonidine eyedrops  #6 neuropathy-continue Lyrica  All the records are reviewed and case discussed with Care Management/Social Workerr. Management plans discussed with the patient, no family is present  CODE STATUS: DO NOT RESUSCITATE  TOTAL TIME TAKING CARE OF THIS PATIENT: 30 minutes.  Greater than 50% of time spent in care coordination and counseling. POSSIBLE D/C IN 2-3 DAYS, DEPENDING ON CLINICAL CONDITION.   Elby Showers M.D on 03/05/2015 at 1:58 PM  Between 7am to 6pm - Pager - [phone]  After 6pm go to [email or web address] - password [email or web address] [facility]  [place] [facility]  Office  [phone]  CC: Primary care physician; Bobbye Morton, MD

## 2015-03-05 NOTE — Progress Notes (Signed)
Patient is alert and oriented with some memory impairment. Has been more sleepy today, but is arousable to voice. No complaints of pain. Sinus tach in the low 100's with multiple PVC's on tele. Oxygen has been transitioned from 5 to 4 liters. Physical therapy consult added. Patient states he would be okay with some short term rehab "if necessary". Patient has been incontinent a few times and has used the urinal some as well. Will continue to monitor.

## 2015-03-06 ENCOUNTER — Inpatient Hospital Stay: Payer: Medicare (Managed Care)

## 2015-03-06 LAB — BASIC METABOLIC PANEL
Anion gap: 8 (ref 5–15)
BUN: 54 mg/dL — AB (ref 6–20)
CHLORIDE: 106 mmol/L (ref 101–111)
CO2: 29 mmol/L (ref 22–32)
CREATININE: 4.02 mg/dL — AB (ref 0.61–1.24)
Calcium: 8.5 mg/dL — ABNORMAL LOW (ref 8.9–10.3)
GFR calc Af Amer: 15 mL/min — ABNORMAL LOW (ref >60)
GFR calc non Af Amer: 13 mL/min — ABNORMAL LOW (ref >60)
Glucose, Bld: 99 mg/dL (ref 65–99)
Potassium: 4.8 mmol/L (ref 3.5–5.1)
Sodium: 143 mmol/L (ref 135–145)

## 2015-03-06 LAB — CBC
HCT: 33.1 % — ABNORMAL LOW (ref 40.0–52.0)
Hemoglobin: 10.4 g/dL — ABNORMAL LOW (ref 13.0–18.0)
MCH: 28.7 pg (ref 26.0–34.0)
MCHC: 31.4 g/dL — ABNORMAL LOW (ref 32.0–36.0)
MCV: 91.3 fL (ref 80.0–100.0)
PLATELETS: 146 10*3/uL — AB (ref 150–440)
RBC: 3.62 MIL/uL — AB (ref 4.40–5.90)
RDW: 16.8 % — AB (ref 11.5–14.5)
WBC: 6.5 10*3/uL (ref 3.8–10.6)

## 2015-03-06 MED ORDER — AMLODIPINE BESYLATE 10 MG PO TABS
10.0000 mg | ORAL_TABLET | Freq: Every day | ORAL | Status: DC
  Administered 2015-03-06 – 2015-03-10 (×5): 10 mg via ORAL
  Filled 2015-03-06 (×5): qty 1

## 2015-03-06 MED ORDER — FUROSEMIDE 10 MG/ML IJ SOLN
60.0000 mg | Freq: Three times a day (TID) | INTRAMUSCULAR | Status: DC
  Administered 2015-03-06 – 2015-03-07 (×3): 60 mg via INTRAVENOUS
  Filled 2015-03-06 (×3): qty 6

## 2015-03-06 MED ORDER — ATORVASTATIN CALCIUM 20 MG PO TABS
20.0000 mg | ORAL_TABLET | Freq: Every day | ORAL | Status: DC
  Administered 2015-03-06 – 2015-03-10 (×5): 20 mg via ORAL
  Filled 2015-03-06 (×6): qty 1

## 2015-03-06 NOTE — Care Management (Signed)
Patient presents from home.  He is [facility] by [facility]. He goes to the center three days a week during the day.  He has aide service two days a week.  He is currently  beeing assessed for services 5 days a week, but "it has not started yet."  He does have impaired vision-  Blind in his right eye but able to see out of his left eye.  He has a  front wheeled rolling walker in the home.  He lives alone.  His 02 is not chronic.  Left message with [facility] that patient is admitted.  Physical therapy has recommended skilled nursing placement.  Patient says that he will go if he has too.

## 2015-03-06 NOTE — Clinical Social Work Note (Signed)
Clinical Social Work Assessment  Patient Details  Name: Sean Delgado. MRN: [id number] Date of Birth: [birth year]  Date of referral:   03/06/15                 Reason for consult:     SNF     Permission sought to share information with:    Permission granted to share information::   [facility], [facility], Dtr/Sean Delgado  Name::        Agency::     Relationship::     Contact Information:     Housing/Transportation Living arrangements for the past 2 months:    Source of Information:    Patient Interpreter Needed:    Criminal Activity/Legal Involvement Pertinent to Current Situation/Hospitalization:    Significant Relationships:    Lives with:    Do you feel safe going back to the place where you live?    Need for family participation in patient care:     Care giving concerns:     Facilities manager / plan:  CSW met with pt re: PT recommendation for SNF. Pt reports he lives alone. Identifies his dtr/Sean Delgado as main support. Per pt report, has a previous SNF stay for rehab at [facility] in past. CSW reviewed placement process with [facility] and answered questions. Pt reports agreeable to SNF and aware [facility] contracts with [facility] only. CSW spoke with [facility] SW, Sean Delgado [phone], and made her aware of STR/SNF need. Will complete FL2 and send to [facility]. FL2 on chart for MD signature.   Employment status:    Insurance information:    PT Recommendations:    Information / Referral to community resources:     Patient/Family's Response to care:  Pt reports agreeable to above dc plan.   Patient/Family's Understanding of and Emotional Response to Diagnosis, Current Treatment, and Prognosis:    Emotional Assessment Appearance:    Attitude/Demeanor/Rapport:    Affect (typically observed):    Orientation:    Alcohol / Substance use:    Psych involvement (Current and /or in the community):     Discharge Needs  Concerns to be addressed:    Readmission within the last 30  days:    Current discharge risk:    Barriers to Discharge:      Amador Cunas, [place] 03/06/2015, 11:48 AM

## 2015-03-06 NOTE — Consult Note (Signed)
Requesting Physician: Dr Myrtis Ser Reason for Consult: CKD management  HPI: Mr Sean Delgado is a 81 year old AAM who is followed in the [facility] by Tawni Levy and Jannifer Hick. He has CKD4. His last clinic visit was on 8/15 and at that time, he had a serum Cr level of 3.9 with an eGFR of 16 ml/min. He was started on sodium bicarb at that visit and was referred for a perm access to Dr Lucky Cowboy. He has an appt, he tells me, in October.  He is admitted on 8/27 with a sudden onset of SOB. He awoke on Saturday morning with SOB. He denies any other symptoms (CP, dizzyness, N/V). He was found to have evidence of fluid overload on CXR and has been requiring oxygen. He also has edema.  He is currently receiving IV lasix and he tells me that "it makes me go."  He denies any dietary indiscretion and tells me that he is feeling a little better since being admitted.  PMH: CKD4 htn Acidosis CVA Glaucoma  ALL: NKDA  MEDS: reviewed Currently on lasix 60 mg IV q8 FH: No kidney disease in family SH: not smoking  Exam: 98.2  136/76  102  95% 4L [place]  I/O  600/700  (OVER 24 HR 440/1.6)  Gen: Elderly man, NAD, wearing oxygen Lungs: decreased right base, faint crackles right > left CV: no rub, tachycardic Neuro: No asterixis Ext: No edema  LABS and TTE reviewed as well as CXR  A/P: 81 year old man with CKD4, admitted with volume overload likely from CKD status  1) CKD4- Currently, no acute needs for dialysis as long as responds to diuresis. Monitor I's and O's and daily BMP. Pt does not currently have access for dialysis. Agree with current dose of lasix. He is on lasix 40 mg bid at home.  Continue sodium bicarb  2) Anemia- No indication for ESA. Obtain iron studies if Hgb drops.  He is on oral iron at home.  3) HTN- controlled. Cardiology following and pt on tele.  4) Volume- See #1.  Please page [phone] with questions.

## 2015-03-06 NOTE — Evaluation (Signed)
Physical Therapy Evaluation Patient Details Name: Sean Delgado. MRN: [id number] DOB: [birth year] Today's Date: 03/06/2015   History of Present Illness  Pt is a 81 y.o. male presenting to hospital with SOB and worsening LE edema.  Pt admitted with CHF-acute on chronic diastolic dysfunction.  PMH:  CAD, htn, h/o CHF, gout, glaucoma, peripheral neuropathy, CKD stage IV.  Clinical Impression  Currently pt demonstrates impairments with strength, balance, cardiopulmonary status, and limitations with functional mobility.  Prior to admission, pt was ambulating with rollator and had nursing aides 2x/week and went to [facility] program 2x's/week.  Pt lives alone in 1 level home with elevator.  Currently pt requires assist with bed mobility, transfers, and ambulation using RW and does not appear safe to discharge home alone (pt also desaturated to 88% on 4 L/min via nasal cannula post ambulation).  Pt would benefit from skilled PT to address above noted impairments and functional limitations.  Recommend pt discharge to STR when medically appropriate.     Follow Up Recommendations SNF    Equipment Recommendations       Recommendations for Other Services       Precautions / Restrictions Precautions Precautions: Fall Precaution Comments: monitor O2 Restrictions Weight Bearing Restrictions: No      Mobility  Bed Mobility Overal bed mobility: Needs Assistance Bed Mobility: Supine to Sit     Supine to sit: Mod assist;HOB elevated     General bed mobility comments: assist for trunk and to scoot to edge of bed  Transfers Overall transfer level: Needs assistance Equipment used: Rolling walker (2 wheeled) Transfers: Sit to/from Stand Sit to Stand: Max assist;+2 safety/equipment         General transfer comment: assist to initiate stand and come to full stand with RW for support (x2 trials)  Ambulation/Gait Ambulation/Gait assistance: Min assist;+2 physical assistance;+2  safety/equipment Ambulation Distance (Feet): 10 Feet Assistive device: Rolling walker (2 wheeled)   Gait velocity: decreased   General Gait Details: decreased B step length/foot clearance/heelstrike; vc's required for safe walker management  Stairs            Wheelchair Mobility    Modified Rankin (Stroke Patients Only)       Balance Overall balance assessment: Needs assistance Sitting-balance support: Bilateral upper extremity supported;Feet supported Sitting balance-Leahy Scale: Fair   Postural control: Posterior lean Standing balance support: Bilateral upper extremity supported Standing balance-Leahy Scale: Fair                               Pertinent Vitals/Pain Pain Assessment: No/denies pain  O2 decreased to 88% (from 92%) on 4 L/min via nasal cannula post ambulation (returned to 92% with rest on 4 L/min via nasal cannula). HR in 90's to 104 bpm when checked during session.    Home Living Family/patient expects to be discharged to:: Private residence Living Arrangements: Alone Available Help at Discharge: Home health (Nursing aides Tuesday and Thursday (pt reports he will have 5 days a week on discharge?) and [facility] program [facility].)   Home Access: Elevator     Home Layout: One level Home Equipment: Walker - 4 wheels;Shower seat      Prior Function Level of Independence: Needs assistance   Gait / Transfers Assistance Needed: independent with rollator  ADL's / Homemaking Assistance Needed: assist for bathing  Comments: Nursing aides Tuesday and Thursday (pt reports he will have 5 days a week on discharge?) and [facility]  program [facility].     Hand Dominance        Extremity/Trunk Assessment   Upper Extremity Assessment: Generalized weakness           Lower Extremity Assessment: Generalized weakness         Communication   Communication: No difficulties  Cognition Arousal/Alertness: Awake/alert   Overall Cognitive Status: No  family/caregiver present to determine baseline cognitive functioning (pt oriented to name and date of birth and knew he was in hospital (not sure which one though); also knew month and year (not day))                      General Comments   Nursing cleared pt for participation in physical therapy.  Pt agreeable to PT session.    Exercises  Treatment:  Practiced sit to/from stand technique with cues for hand and feet positioning using RW (x8 minutes); max assist x1 plus CGA of 2nd for safety.      Assessment/Plan    PT Assessment Patient needs continued PT services  PT Diagnosis Difficulty walking;Generalized weakness   PT Problem List Decreased strength;Decreased activity tolerance;Decreased balance;Decreased mobility;Decreased knowledge of use of DME;Decreased safety awareness;Cardiopulmonary status limiting activity  PT Treatment Interventions DME instruction;Gait training;Functional mobility training;Therapeutic activities;Therapeutic exercise;Balance training;Patient/family education   PT Goals (Current goals can be found in the Care Plan section) Acute Rehab PT Goals Patient Stated Goal: To walk again PT Goal Formulation: With patient Time For Goal Achievement: 03/20/15 Potential to Achieve Goals: Good    Frequency Min 2X/week   Barriers to discharge Decreased caregiver support      Co-evaluation               End of Session Equipment Utilized During Treatment: Gait belt;Oxygen Activity Tolerance: Patient tolerated treatment well Patient left: in chair;with call bell/phone within reach;with chair alarm set (pt able to press button and verbalize need to press button when he wanted nursing assist) Nurse Communication: Mobility status;Precautions         Time: 1610-9604 PT Time Calculation (min) (ACUTE ONLY): 46 min   Charges:   PT Evaluation $Initial PT Evaluation Tier I: 1 Procedure PT Treatments $Therapeutic Activity: 8-22 mins   PT G CodesHendricks Limes 03/06/2015, 11:07 AM Hendricks Limes, PT [phone]

## 2015-03-06 NOTE — Care Management Important Message (Signed)
Important Message  Patient Details  Name: Sean Delgado. MRN: [id number] Date of Birth: 09-02-1933   Medicare Important Message Given:  Yes-second notification given    [name] 03/06/2015, 10:26 AM

## 2015-03-06 NOTE — Clinical Documentation Improvement (Addendum)
Hospitalist  Query 1 of 2 Please document if a condition below provides greater specificity regarding the patient's hypoxia:   - Acute on chronic hypoxic respiratory failure, present on admission (POA), secondary to acute diastolic heart failure  - Other condition  - Unable to clinically determine  Clinical Information:  - EMS reported that the patient's 02 sat on room air was in the 80's per H&P and nurse's notes  - EMS arrived and his O2 sat on room air was in the 80s, and he does not wear any home O2 per ED provider note  - On 5L of oxygen currently sating around 92% per progress note dated 03/05/15  - Some use of abdominal wall muscles for breathing was noted by the ED provider.   - "Pt also desaturated to 88% on 4 L/min via nasal cannula post ambulation" is documented by the physical therapist 03/06/15 at 11:07 AM  Query 2 of 2  Possible Clinical Conditions:  - Hypertensive Heart Disease  - Other condition  - Unable to clinically determine  Clinical Information: Patient has a documented history of Hypertension per current medical record. Moderate cardiac enlargement is noted on the patient's CXR dated 03/06/15. Patient was admitted with acute diastolic heart failure EF 55-60% by Echo this admission EF 35 by nuclear stress test this admission.   Please exercise your independent, professional judgment when responding. A specific answer is not anticipated or expected.  Thank You, Jerral Ralph  RN BSN CCDS  [phone] Health Information Management [place]

## 2015-03-06 NOTE — Progress Notes (Signed)
[facility] - [place] at [facility]   PATIENT NAME: Sean Delgado    MR#:  [id number]  DATE OF BIRTH:  03/16/1934  SUBJECTIVE:  CHIEF COMPLAINT:   Chief Complaint  Patient presents with  . Shortness of Breath   Comfortable at rest though very short of breath with activity. Requiring 4 L via nasal cannula.  REVIEW OF SYSTEMS:   Review of Systems  Constitutional: Negative for fever.  Respiratory: Positive for cough, shortness of breath and wheezing.   Cardiovascular: Negative for chest pain and palpitations.  Gastrointestinal: Negative for nausea, vomiting and abdominal pain.  Genitourinary: Negative for dysuria.    DRUG ALLERGIES:  No Known Allergies  VITALS:  Blood pressure 149/65, pulse 105, temperature 98.2 F (36.8 C), temperature source Oral, resp. rate 12, height  (1.88 m), weight 111.403 kg (245 lb 9.6 oz), SpO2 95 %.  PHYSICAL EXAMINATION:  GENERAL:  81 y.o.-year-old patient sitting in chair with no acute distress.  EYES: Pupils equal, round, reactive to light and accommodation. No scleral icterus. Extraocular muscles intact.  HEENT: Head atraumatic, normocephalic. Oropharynx and nasopharynx clear.  NECK:  Supple, no jugular venous distention. No thyroid enlargement, no tenderness.  LUNGS: Diffuse crackles and wheezes, no overt distress or use of accessory muscles CARDIOVASCULAR: S1, S2 normal. No murmurs, rubs, or gallops.  ABDOMEN: Soft, nontender, nondistended. Bowel sounds present. No organomegaly or mass.  EXTREMITIES: No pedal edema, cyanosis, or clubbing.  NEUROLOGIC: Cranial nerves II through XII are intact. Muscle strength 5/5 in all extremities. Sensation intact. Gait not checked.  PSYCHIATRIC: The patient is alert and oriented x 3.  SKIN: No obvious rash, lesion, or ulcer.    LABORATORY PANEL:   CBC  Recent Labs Lab 03/06/15 0434  WBC 6.5  HGB 10.4*  HCT 33.1*  PLT 146*    ------------------------------------------------------------------------------------------------------------------  Chemistries   Recent Labs Lab 03/04/15 1212  03/06/15 0434  NA 143  < > 143  K 4.9  < > 4.8  CL 108  < > 106  CO2 25  < > 29  GLUCOSE 116*  < > 99  BUN 51*  < > 54*  CREATININE 4.24*  < > 4.02*  CALCIUM 8.5*  < > 8.5*  AST 28  --   --   ALT 20  --   --   ALKPHOS 89  --   --   BILITOT 0.8  --   --   < > = values in this interval not displayed. ------------------------------------------------------------------------------------------------------------------  Cardiac Enzymes  Recent Labs Lab 03/05/15 0007  TROPONINI <0.03   ------------------------------------------------------------------------------------------------------------------  RADIOLOGY:  No results found.  EKG:   Orders placed or performed during the hospital encounter of 03/04/15  . ED EKG  . ED EKG    ASSESSMENT AND PLAN:    81 year old male with past medical history of chronic kidney disease stage IV, hypertension, gout, history of CHF, neuropathy, glaucoma who presents to the hospital with shortness of breath and worsening lower extremity edema noted to be in CHF.  #1 CHF-acute on chronic diastolic dysfunction. - Echo shows ejection fraction of 55-60% - Increase IV Lasix 60 mg 3 times a day - Node-negative greater than 2 L, weight decreased 3 kg -Continue metoprolol, Imdur, hydralazine. - Cardiology consultation pending  #2 CKD stage IV-patient is followed by [facility] nephrology. -No acute indication for hemodialysis presently. Will follow BUN/creatinine with IV diuresis. Increasing Lasix dose today [facility] nephrology consultation today  #3 history of gout-no  acute attack continue allopurinol.  #4 chronic anemia-due to CKD. Continue iron supplements.  #5 glaucoma-continue his latanoprost, brimonidine eyedrops  #6 neuropathy-continue Lyrica  All the records are reviewed and case  discussed with Care Management/Social Workerr. Management plans discussed with the patient, no family is present  CODE STATUS: DO NOT RESUSCITATE  TOTAL TIME TAKING CARE OF THIS PATIENT: 30 minutes.  Greater than 50% of time spent in care coordination and counseling. Will need skilled nursing placement, care management working towards POSSIBLE D/C IN 2-3 DAYS, DEPENDING ON CLINICAL CONDITION.   Elby Showers M.D on 03/06/2015 at 12:54 PM  Between 7am to 6pm - Pager - [phone]  After 6pm go to [email or web address] - password [email or web address] [email or web address]  [place] [facility]  Office  [phone]  CC: Primary care physician; Bobbye Morton, MD

## 2015-03-06 NOTE — Progress Notes (Signed)
Patient resting quietly today. No complaints of pain this shift. Worked with PT - see note. [facility] nephrology consulted, no note placed yet.

## 2015-03-06 NOTE — Progress Notes (Signed)
Initial Nutrition Assessment   INTERVENTION:   Meals and Snacks: Cater to patient preferences Education: pt reports "I have not used salt in 15-20 years." However upon discussion pt reports eating frozen meals and prepared meals which are likely high in sodium. RD educated and reviewed low sodium options on visit and importance of following a low sodium diet. Of note, RD did not leave written materials as per RN Ladona Ridgel pt is partially blind but provided verbal instruction. Teach back method used. Expect good compliance.  NUTRITION DIAGNOSIS:   Food and nutrition related knowledge deficit related to chronic illness as evidenced by per patient/family report.  GOAL:   Patient will meet greater than or equal to 90% of their needs  MONITOR:    (Energy Intake, Anthropometrics, Electrolyte and renal Profile, Pulmonary Profile)  REASON FOR ASSESSMENT:   Diagnosis    ASSESSMENT:   Pt admitted with SOB secondary to volume overload with h/o CHF and CKD stage IV.  Past Medical History  Diagnosis Date  . Chronic renal failure, stage 4 (severe)   . Hypertension   . CHF (congestive heart failure)   . Gout   . Glaucoma   . Neuropathy     Diet Order:  Diet renal with fluid restriction Fluid restriction:: 1200 mL Fluid; Room service appropriate?: Yes; Fluid consistency:: Thin   Current Nutrition: Pt reports good appetite ate eggs and hamburger patty for breakfast and could not remember lunch as family was visiting. Recorded po intake 100% of meals since admission.   Food/Nutrition-Related History: Pt reports good appetite PTA eating 3 meals per day.    Medications: IV Lasix, Senokot, MVI, ferrous sulfate,   Electrolyte/Renal Profile and Glucose Profile:   Recent Labs Lab 03/04/15 1212 03/05/15 0007 03/06/15 0434  NA 143 143 143  K 4.9 4.8 4.8  CL 108 110 106  CO2 BUN 51* 51* 54*  CREATININE 4.24* 4.20* 4.02*  CALCIUM 8.5* 8.3* 8.5*  GLUCOSE 116* 99 99    Protein Profile:   Recent Labs Lab 03/04/15 1212  ALBUMIN 3.4*    Gastrointestinal Profile: Last BM:  03/04/2015   Nutrition-Focused Physical Exam Findings: Nutrition-Focused physical exam completed. Findings are WDL for fat depletion, muscle depletion, and moderate edema.    Weight Change: Pt reports UBW of 245-255lbs  (4-5% weight loss from UBW)   Skin:  Reviewed, no issues   Height:   Ht Readings from Last 1 Encounters:  03/04/15  (1.88 m)    Weight:   Wt Readings from Last 1 Encounters:  03/06/15 245 lb 9.6 oz (111.403 kg)    Ideal Body Weight:  86 kg  BMI:  Body mass index is 31.52 kg/(m^2).  Estimated Nutritional Needs:   Kcal:  BEE: 1645kcals, TEE: (IF 1.0-1.2)(AF 1.3) 2138-2566kcals  Protein:  69-86g protein (0.8-1.0g/kg)  Fluid:  2150-2521mL of fluid (25-87mL/kg)  EDUCATION NEEDS:   Education needs addressed/reviewed   MODERATE Care Level  Leda Quail, [place], LDN Pager [phone]

## 2015-03-07 ENCOUNTER — Inpatient Hospital Stay: Payer: Medicare (Managed Care)

## 2015-03-07 ENCOUNTER — Encounter: Payer: Self-pay | Admitting: Radiology

## 2015-03-07 LAB — RENAL FUNCTION PANEL
ALBUMIN: 3.1 g/dL — AB (ref 3.5–5.0)
ANION GAP: 8 (ref 5–15)
BUN: 63 mg/dL — ABNORMAL HIGH (ref 6–20)
CO2: 30 mmol/L (ref 22–32)
Calcium: 8.8 mg/dL — ABNORMAL LOW (ref 8.9–10.3)
Chloride: 102 mmol/L (ref 101–111)
Creatinine, Ser: 4.07 mg/dL — ABNORMAL HIGH (ref 0.61–1.24)
GFR calc Af Amer: 15 mL/min — ABNORMAL LOW (ref >60)
GFR, EST NON AFRICAN AMERICAN: 13 mL/min — AB (ref >60)
GLUCOSE: 103 mg/dL — AB (ref 65–99)
PHOSPHORUS: 4.1 mg/dL (ref 2.5–4.6)
POTASSIUM: 4.9 mmol/L (ref 3.5–5.1)
Sodium: 140 mmol/L (ref 135–145)

## 2015-03-07 LAB — CBC
HEMATOCRIT: 33.6 % — AB (ref 40.0–52.0)
HEMOGLOBIN: 10.5 g/dL — AB (ref 13.0–18.0)
MCH: 28.6 pg (ref 26.0–34.0)
MCHC: 31.3 g/dL — AB (ref 32.0–36.0)
MCV: 91.4 fL (ref 80.0–100.0)
Platelets: 143 10*3/uL — ABNORMAL LOW (ref 150–440)
RBC: 3.67 MIL/uL — ABNORMAL LOW (ref 4.40–5.90)
RDW: 16.8 % — ABNORMAL HIGH (ref 11.5–14.5)
WBC: 6.2 10*3/uL (ref 3.8–10.6)

## 2015-03-07 LAB — NM MYOCAR MULTI W/SPECT W/WALL MOTION / EF
CHL CUP RESTING HR STRESS: 105 {beats}/min
CSEPED: 1 min
CSEPEDS: 0 s
CSEPEW: 1 METS
CSEPPHR: 121 {beats}/min
LV dias vol: 120 mL
LVSYSVOL: 77 mL
NUC STRESS TID: 1
SDS: 0
SRS: 6
SSS: 5

## 2015-03-07 MED ORDER — FUROSEMIDE 10 MG/ML IJ SOLN
80.0000 mg | Freq: Three times a day (TID) | INTRAMUSCULAR | Status: DC
  Administered 2015-03-07 – 2015-03-08 (×3): 80 mg via INTRAVENOUS
  Filled 2015-03-07 (×3): qty 8

## 2015-03-07 MED ORDER — SODIUM CHLORIDE 0.9 % IJ SOLN: 3.0000 mL | INTRAMUSCULAR | Status: DC | PRN

## 2015-03-07 MED ORDER — REGADENOSON 0.4 MG/5ML IV SOLN
0.4000 mg | Freq: Once | INTRAVENOUS | Status: AC
  Administered 2015-03-07: 0.4 mg via INTRAVENOUS
  Filled 2015-03-07: qty 5

## 2015-03-07 MED ORDER — TECHNETIUM TC 99M SESTAMIBI GENERIC - CARDIOLITE
10.0000 | Freq: Once | INTRAVENOUS | Status: AC | PRN
Start: 2015-03-07 — End: 2015-03-07
  Administered 2015-03-07: 13.75 via INTRAVENOUS

## 2015-03-07 MED ORDER — TECHNETIUM TC 99M SESTAMIBI GENERIC - CARDIOLITE
30.0000 | Freq: Once | INTRAVENOUS | Status: AC | PRN
  Administered 2015-03-07: 32.863 via INTRAVENOUS

## 2015-03-07 NOTE — Plan of Care (Signed)
Problem: Phase I Progression Outcomes Goal: EF % per last Echo/documented,Core Reminder form on chart Outcome: Completed/Met Date Met:  03/07/15 55-60%

## 2015-03-07 NOTE — Progress Notes (Signed)
[facility] - [place] at [facility]   PATIENT NAME: Sean Delgado    MR#:  [id number]  DATE OF BIRTH:  [birth year]  SUBJECTIVE:  CHIEF COMPLAINT:   Chief Complaint  Patient presents with  . Shortness of Breath   Eating lunch. Has just returned from cardiac stress test. Still on 4 L nasal cannula  REVIEW OF SYSTEMS:   Review of Systems  Constitutional: Negative for fever.  Respiratory: Positive for cough, shortness of breath and wheezing.   Cardiovascular: Negative for chest pain and palpitations.  Gastrointestinal: Negative for nausea, vomiting and abdominal pain.  Genitourinary: Negative for dysuria.    DRUG ALLERGIES:  No Known Allergies  VITALS:  Blood pressure 124/76, pulse 109, temperature 97.9 F (36.6 C), temperature source Oral, resp. rate 18, height 6\' 2"  (1.88 m), weight 110.496 kg (243 lb 9.6 oz), SpO2 94 %.  PHYSICAL EXAMINATION:  GENERAL:  81 y.o.-year-old patient sitting in chair with no acute distress.  EYES: Pupils equal, round, reactive to light and accommodation. No scleral icterus. Extraocular muscles intact.  HEENT: Head atraumatic, normocephalic. Oropharynx and nasopharynx clear.  NECK:  Supple, no jugular venous distention. No thyroid enlargement, no tenderness.  LUNGS: Diffuse crackles and wheezes, no overt distress or use of accessory muscles CARDIOVASCULAR: S1, S2 normal. No murmurs, rubs, or gallops.  ABDOMEN: Soft, nontender, nondistended. Bowel sounds present. No organomegaly or mass.  EXTREMITIES: No pedal edema, cyanosis, or clubbing.  NEUROLOGIC: Cranial nerves II through XII are intact. Muscle strength 5/5 in all extremities. Sensation intact. Gait not checked.  PSYCHIATRIC: The patient is alert and oriented x 3.  SKIN: No obvious rash, lesion, or ulcer.    LABORATORY PANEL:   CBC  Recent Labs Lab 03/07/15 0439  WBC 6.2  HGB 10.5*  HCT 33.6*  PLT 143*    ------------------------------------------------------------------------------------------------------------------  Chemistries   Recent Labs Lab 03/04/15 1212  03/07/15 0439  NA 143  < > 140  K 4.9  < > 4.9  CL 108  < > 102  CO2 25  < > 30  GLUCOSE 116*  < > 103*  BUN 51*  < > 63*  CREATININE 4.24*  < > 4.07*  CALCIUM 8.5*  < > 8.8*  AST 28  --   --   ALT 20  --   --   ALKPHOS 89  --   --   BILITOT 0.8  --   --   < > = values in this interval not displayed. ------------------------------------------------------------------------------------------------------------------  Cardiac Enzymes  Recent Labs Lab 03/05/15 0007  TROPONINI <0.03   ------------------------------------------------------------------------------------------------------------------  RADIOLOGY:  Dg Chest 2 View  03/06/2015   CLINICAL DATA:  Known history of coronary artery disease. Hypertension and CHF. Now with shortness of breath and lower extremity edema.  EXAM: CHEST  2 VIEW  COMPARISON:  03/04/2015  FINDINGS: There is moderate cardiac enlargement. There is aortic atherosclerosis. Small to moderate left pleural effusion noted. Pulmonary edema pattern is improved from the previous exam. No airspace consolidation  IMPRESSION: 1. Congestive heart failure with improving pulmonary edema pattern.   Electronically Signed   By: Signa Kell M.D.   On: 03/06/2015 14:30   Nm Myocar Multi W/spect W/wall Motion / Ef  03/07/2015    There was no ST segment deviation noted during stress.  global depressed left ventricular function ejection fraction between 35  and 40%  apical defect no ischemia noted ischemia  Defect 1: There is a small defect of mild  severity present in the apex  location.  This is a low risk study.  Nuclear stress EF: 35%.  The left ventricular ejection fraction is moderately decreased (30-44%).     EKG:   Orders placed or performed during the hospital encounter of 03/04/15  . ED EKG   . ED EKG    ASSESSMENT AND PLAN:    81 year old male with past medical history of chronic kidney disease stage IV, hypertension, gout, history of CHF, neuropathy, glaucoma who presents to the hospital with shortness of breath and worsening lower extremity edema noted to be in CHF.  #1 CHF-acute on chronic diastolic dysfunction. - Echo shows ejection fraction of 55-60% - Continue IV Lasix 60 mg 3 times a day - Net-negative greater than 3 L, weight decreased 5 kg -Continue metoprolol, Imdur, hydralazine. - Cardiology consultation appreciated, had cardiac stress testing today, results are pending  #2 CKD stage IV-patient is followed by [facility] nephrology. -No acute indication for hemodialysis presently. Will follow BUN/creatinine with IV diuresis. Continue increased Lasix dose. [facility] nephrology consultation appreciated. It seems that he is planning to have dialysis in the future. If oxygen need does not decrease significantly would consider at least 1-2 sessions of inpatient hemodialysis to relieve fluid burden  #3 history of gout-no acute attack continue allopurinol.  #4 chronic anemia-due to CKD. Continue iron supplements.  #5 glaucoma-continue his latanoprost, brimonidine eyedrops  #6 neuropathy-continue Lyrica  All the records are reviewed and case discussed with Care Management/Social Workerr. Management plans discussed with the patient, no family is present  CODE STATUS: DO NOT RESUSCITATE  TOTAL TIME TAKING CARE OF THIS PATIENT: 30 minutes.  Greater than 50% of time spent in care coordination and counseling. Will need skilled nursing placement, care management working towards POSSIBLE D/C IN 2-3 DAYS, DEPENDING ON CLINICAL CONDITION.   Elby Showers M.D on 03/07/2015 at 3:06 PM  Between 7am to 6pm - Pager - [phone]  After 6pm go to [email or web address] - password [email or web address] [facility]  [place] [facility]  Office  [phone]  CC: Primary care physician; Bobbye Morton,  MD

## 2015-03-07 NOTE — Clinical Social Work Placement (Signed)
   CLINICAL SOCIAL WORK PLACEMENT  NOTE  Date:  03/07/2015  Patient Details  Name: Sean Delgado. MRN: [id number] Date of Birth: [birth year]  Clinical Social Work is seeking post-discharge placement for this patient at the Skilled  Nursing Facility level of care (*CSW will initial, date and re-position this form in  chart as items are completed):  Yes   Patient/family provided with [place] Clinical Social Work Department's list of facilities offering this level of care within the geographic area requested by the patient (or if unable, by the patient's family).  Yes   Patient/family informed of their freedom to choose among providers that offer the needed level of care, that participate in Medicare, Medicaid or managed care program needed by the patient, have an available bed and are willing to accept the patient.  Yes   Patient/family informed of [facility]'s ownership interest in [facility] and [facility], as well as of the fact that they are under no obligation to receive care at these facilities.  PASRR submitted to EDS on       PASRR number received on       Existing PASRR number confirmed on 03/06/15     FL2 transmitted to all facilities in geographic area requested by pt/family on 03/06/15     FL2 transmitted to all facilities within larger geographic area on       Patient informed that his/her managed care company has contracts with or will negotiate with certain facilities, including the following:        Yes   Patient/family informed of bed offers received.  Patient chooses bed at [facility] [place], [facility]     Physician recommends and patient chooses bed at      Patient to be transferred to [facility], [facility] on  .  Patient to be transferred to facility by       Patient family notified on   of transfer.  Name of family member notified:        PHYSICIAN       Additional Comment:  [facility] is the only  facility that [facility] works with in [place].  Bed available for pt once medically stable.  _______________________________________________ Chauncy Passy, LCSW 03/07/2015, 3:20 PM

## 2015-03-08 LAB — CBC
HCT: 33.7 % — ABNORMAL LOW (ref 40.0–52.0)
HEMOGLOBIN: 10.6 g/dL — AB (ref 13.0–18.0)
MCH: 28.4 pg (ref 26.0–34.0)
MCHC: 31.4 g/dL — AB (ref 32.0–36.0)
MCV: 90.4 fL (ref 80.0–100.0)
PLATELETS: 155 10*3/uL (ref 150–440)
RBC: 3.72 MIL/uL — AB (ref 4.40–5.90)
RDW: 15.8 % — ABNORMAL HIGH (ref 11.5–14.5)
WBC: 5.4 10*3/uL (ref 3.8–10.6)

## 2015-03-08 LAB — BASIC METABOLIC PANEL
Anion gap: 7 (ref 5–15)
BUN: 67 mg/dL — AB (ref 6–20)
CHLORIDE: 102 mmol/L (ref 101–111)
CO2: 32 mmol/L (ref 22–32)
Calcium: 8.7 mg/dL — ABNORMAL LOW (ref 8.9–10.3)
Creatinine, Ser: 4.15 mg/dL — ABNORMAL HIGH (ref 0.61–1.24)
GFR calc Af Amer: 15 mL/min — ABNORMAL LOW (ref >60)
GFR calc non Af Amer: 13 mL/min — ABNORMAL LOW (ref >60)
GLUCOSE: 99 mg/dL (ref 65–99)
POTASSIUM: 4.6 mmol/L (ref 3.5–5.1)
Sodium: 141 mmol/L (ref 135–145)

## 2015-03-08 MED ORDER — FUROSEMIDE 40 MG PO TABS: 80.0000 mg | ORAL_TABLET | Freq: Two times a day (BID) | ORAL | Status: DC

## 2015-03-08 MED ORDER — FUROSEMIDE 40 MG PO TABS
80.0000 mg | ORAL_TABLET | Freq: Two times a day (BID) | ORAL | Status: DC
  Administered 2015-03-08 – 2015-03-09 (×2): 80 mg via ORAL
  Filled 2015-03-08 (×2): qty 2

## 2015-03-08 NOTE — Care Management (Signed)
Patient continues to require between 3-4 liters of 02. There is discussion of the possibility of a few rounds of HD if 02 requirements do not decrease. Gean Quint with [facility].

## 2015-03-08 NOTE — Care Management Important Message (Signed)
Important Message  Patient Details  Name: Sean Delgado. MRN: [id number] Date of Birth: Dec 09, 1933   Medicare Important Message Given:  Yes-third notification given    [name] 03/08/2015, 2:59 PM

## 2015-03-08 NOTE — Progress Notes (Signed)
[facility] - [place] at [facility]   PATIENT NAME: Sean Delgado    MR#:  [id number]  DATE OF BIRTH:  December 18, 1933  SUBJECTIVE:  CHIEF COMPLAINT:   Chief Complaint  Patient presents with  . Shortness of Breath   Doing well. Oxygen demand decreasing now on 3 L via nasal cannula. Has been working with physical therapy. No respiratory distress. Continues to have good urine output and weight loss  REVIEW OF SYSTEMS:   Review of Systems  Constitutional: Negative for fever.  Respiratory: Positive for cough, shortness of breath and wheezing.   Cardiovascular: Negative for chest pain and palpitations.  Gastrointestinal: Negative for nausea, vomiting and abdominal pain.  Genitourinary: Negative for dysuria.    DRUG ALLERGIES:  No Known Allergies  VITALS:  Blood pressure 129/75, pulse 25, temperature 98.3 F (36.8 C), temperature source Oral, resp. rate 20, height  (1.88 m), weight 109.816 kg (242 lb 1.6 oz), SpO2 98 %.  PHYSICAL EXAMINATION:  GENERAL:  81 y.o.-year-old patient sitting in chair with no acute distress.  EYES: Left pupil is reactive. No scleral icterus. Extraocular muscles intact left eye.  HEENT: Head atraumatic, normocephalic. Oropharynx and nasopharynx clear.  NECK:  Supple, no jugular venous distention. No thyroid enlargement, no tenderness.  LUNGS: Good air movement, bibasilar crackles, no respiratory distress  CARDIOVASCULAR: S1, S2 normal. No murmurs, rubs, or gallops.  ABDOMEN: Soft, nontender, nondistended. Bowel sounds present. No organomegaly or mass.  EXTREMITIES: +1 pedal edema, cyanosis, or clubbing.  NEUROLOGIC: Cranial nerves II through XII are grossly intact. Muscle strength 5/5 in all extremities. Sensation intact. Gait not checked.  PSYCHIATRIC: The patient is alert and oriented x 3.  SKIN: No obvious rash, lesion, or ulcer.    LABORATORY PANEL:   CBC  Recent Labs Lab 03/08/15 0645  WBC 5.4  HGB 10.6*  HCT  33.7*  PLT 155   ------------------------------------------------------------------------------------------------------------------  Chemistries   Recent Labs Lab 03/04/15 1212  03/08/15 0645  NA 143  < > 141  K 4.9  < > 4.6  CL 108  < > 102  CO2 25  < > 32  GLUCOSE 116*  < > 99  BUN 51*  < > 67*  CREATININE 4.24*  < > 4.15*  CALCIUM 8.5*  < > 8.7*  AST 28  --   --   ALT 20  --   --   ALKPHOS 89  --   --   BILITOT 0.8  --   --   < > = values in this interval not displayed. ------------------------------------------------------------------------------------------------------------------  Cardiac Enzymes  Recent Labs Lab 03/05/15 0007  TROPONINI <0.03   ------------------------------------------------------------------------------------------------------------------  RADIOLOGY:  Nm Myocar Multi W/spect W/wall Motion / Ef  03/07/2015    There was no ST segment deviation noted during stress.  global depressed left ventricular function ejection fraction between 35  and 40%  apical defect no ischemia noted ischemia  Defect 1: There is a small defect of mild severity present in the apex  location.  This is a low risk study.  Nuclear stress EF: 35%.  The left ventricular ejection fraction is moderately decreased (30-44%).     EKG:   Orders placed or performed during the hospital encounter of 03/04/15  . ED EKG  . ED EKG    ASSESSMENT AND PLAN:    81 year old male with past medical history of chronic kidney disease stage IV, hypertension, gout, history of CHF, neuropathy, glaucoma who presents to the hospital  with shortness of breath and worsening lower extremity edema noted to be in CHF.  #1 CHF-acute on chronic diastolic dysfunction. - Echo shows ejection fraction of 55-60%, stress test showing ejection fraction 35% 8/30 - Continue IV Lasix 80 mg 3 times a day - Net-negative greater than 5 L, weight decreased 6 kg -Continue metoprolol, Imdur, hydralazine. -  Cardiology consultation appreciated, had cardiac stress testing 8/30, decreased ejection fraction, low risk study  #2 CKD stage IV-patient is followed by [facility] nephrology. -No acute indication for hemodialysis presently. BUN and creatinine have been fairly stable with aggressive diuresis [facility] nephrology consultation appreciated. It seems that he is planning to have dialysis in the future, has an appointment for AV fistula placement as an outpatient  #3 history of gout-no acute attack continue allopurinol.  #4 chronic anemia-due to CKD. Continue iron supplements.  #5 glaucoma-continue his latanoprost, brimonidine eyedrops  #6 neuropathy-continue Lyrica  All the records are reviewed and case discussed with Care Management/Social Workerr. Management plans discussed with the patient, his sister, his primary care provider at [facility],  CODE STATUS: DO NOT RESUSCITATE  TOTAL TIME TAKING CARE OF THIS PATIENT: 30 minutes.  Greater than 50% of time spent in care coordination and counseling. Will need skilled nursing placement, care management working towards POSSIBLE D/C IN 1-2 DAYS, DEPENDING ON CLINICAL CONDITION.   Elby Showers M.D on 03/08/2015 at 2:57 PM  Between 7am to 6pm - Pager - [phone]  After 6pm go to [email or web address] - password [email or web address] [facility]  [place] [facility]  Office  [phone]  CC: Primary care physician; Bobbye Morton, MD

## 2015-03-08 NOTE — Consult Note (Signed)
S: No events overnight. He is comfortable. He denies SOB as he is sitting in his chair. He had a stress test yesterday. The nurse is titrating his oxygen down to 2L as we speak.  Receiving Lasix 80 mg Q8  O: 129/75  88  98% 3L [place]  I/O 240/1400 General: appears more alert, eating his dinner Lungs: decrease bs at bases CV: No rub Ext: No edema  LABS:  Cr 4.24   A/P: 81 year old man with CKD4, baseline Cr around 4. Admitted with pulm edema  1) CKD4- Renal function stable. No indications for dialysis. He now has f/up in the [facility] at [facility] on Sept 19th at 1pm with Kennith Gain.  At that time, will try to move up his appt with Dr Wyn Quaker for consultation for perm access placement as he will need dialysis in the future.  2) HTN- Controlled on current regimen.  3) Volume Status- Discussed with Dr Clent Ridges. Would recommend trial on oral lasix. 80 mg PO bid is a good place to start. Pt was on 40 mg bid as outpt. His O2 requirement is decreasing. Observe on oral lasix and change dose as needed based on daily weights and I's and O's.  4) Anemia- No indication for ESA  Dr Marianne Sofia will be covering on 9/1 and 9/2.  Discussed with Dr Clent Ridges

## 2015-03-08 NOTE — Progress Notes (Signed)
Physical Therapy Treatment Patient Details Name: Sean Delgado. MRN: [id number] DOB: [birth year] Today's Date: 03/08/2015    History of Present Illness Pt is a 81 y.o. male presenting to hospital with SOB and worsening LE edema.  Pt admitted with CHF-acute on chronic diastolic dysfunction.  PMH:  CAD, htn, h/o CHF, gout, glaucoma, peripheral neuropathy, CKD stage IV.    PT Comments    Pt is making real progress to recommend him to SNF. Should be able to make it a short stay and make excellent recovery as his O2 sats were stable and 97% on 2L after walking.  He is in agreement with plan.  Follow Up Recommendations  SNF     Equipment Recommendations  None recommended by PT    Recommendations for Other Services       Precautions / Restrictions Precautions Precautions: Fall Precaution Comments: monitor O2 Restrictions Weight Bearing Restrictions: No    Mobility  Bed Mobility Overal bed mobility: Needs Assistance Bed Mobility: Supine to Sit     Supine to sit: Min assist;Mod assist;HOB elevated     General bed mobility comments: assist for trunk and to scoot to edge of bed  Transfers Overall transfer level: Needs assistance Equipment used: Rolling walker (2 wheeled) Transfers: Sit to/from [employer] Sit to Stand: Min assist;Mod assist;From elevated surface Stand pivot transfers: Min assist       General transfer comment: help to power up and to control descent  Ambulation/Gait Ambulation/Gait assistance: Min assist Ambulation Distance (Feet): 140 Feet Assistive device: Rolling walker (2 wheeled) Gait Pattern/deviations: Step-through pattern;Wide base of support;Trunk flexed Gait velocity: decreased Gait velocity interpretation: Below normal speed for age/gender General Gait Details: controlled pace due to vision loss   Stairs            Wheelchair Mobility    Modified Rankin (Stroke Patients Only)       Balance Overall balance  assessment: Needs assistance Sitting-balance support: Feet supported Sitting balance-Leahy Scale: Good   Postural control: Posterior lean Standing balance support: Bilateral upper extremity supported Standing balance-Leahy Scale: Fair                      Cognition Arousal/Alertness: Awake/alert Behavior During Therapy: WFL for tasks assessed/performed Overall Cognitive Status: No family/caregiver present to determine baseline cognitive functioning                      Exercises General Exercises - Lower Extremity Ankle Circles/Pumps: Both;10 reps Long Arc Quad: Strengthening;Both;10 reps Heel Slides: Strengthening;Both;10 reps    General Comments General comments (skin integrity, edema, etc.): Pt makes good use of his residual vision for path planning on the hallway      Pertinent Vitals/Pain Pain Assessment: No/denies pain    Home Living                      Prior Function            PT Goals (current goals can now be found in the care plan section)      Frequency  Min 2X/week    PT Plan Current plan remains appropriate    Co-evaluation             End of Session Equipment Utilized During Treatment: Gait belt;Oxygen Activity Tolerance: Patient tolerated treatment well Patient left: in chair;with call bell/phone within reach;with chair alarm set     Time:  -     Charges:  $  Gait Training: 8-22 mins $Therapeutic Exercise: 8-22 mins                    G Codes:      Ivar Drape Apr 05, 2015, 12:51 PM   Samul Dada, PT MS Acute Rehab Dept. Number: [id number] and [facility] [phone]

## 2015-03-09 LAB — BASIC METABOLIC PANEL
ANION GAP: 11 (ref 5–15)
Anion gap: 12 (ref 5–15)
BUN: 74 mg/dL — AB (ref 6–20)
BUN: 71 mg/dL — AB (ref 6–20)
CALCIUM: 8.7 mg/dL — AB (ref 8.9–10.3)
CHLORIDE: 98 mmol/L — AB (ref 101–111)
CHLORIDE: 97 mmol/L — AB (ref 101–111)
CO2: 30 mmol/L (ref 22–32)
CO2: 30 mmol/L (ref 22–32)
CREATININE: 4.41 mg/dL — AB (ref 0.61–1.24)
Calcium: 8.5 mg/dL — ABNORMAL LOW (ref 8.9–10.3)
Creatinine, Ser: 4.51 mg/dL — ABNORMAL HIGH (ref 0.61–1.24)
GFR calc Af Amer: 13 mL/min — ABNORMAL LOW (ref >60)
GFR calc Af Amer: 13 mL/min — ABNORMAL LOW (ref >60)
GFR calc non Af Amer: 11 mL/min — ABNORMAL LOW (ref >60)
GFR calc non Af Amer: 12 mL/min — ABNORMAL LOW (ref >60)
GLUCOSE: 108 mg/dL — AB (ref 65–99)
Glucose, Bld: 147 mg/dL — ABNORMAL HIGH (ref 65–99)
POTASSIUM: 4.7 mmol/L (ref 3.5–5.1)
Potassium: 4.2 mmol/L (ref 3.5–5.1)
SODIUM: 139 mmol/L (ref 135–145)
Sodium: 139 mmol/L (ref 135–145)

## 2015-03-09 LAB — CULTURE, BLOOD (ROUTINE X 2)
CULTURE: NO GROWTH
Culture: NO GROWTH

## 2015-03-09 LAB — CBC
HEMATOCRIT: 32.8 % — AB (ref 40.0–52.0)
Hemoglobin: 10.2 g/dL — ABNORMAL LOW (ref 13.0–18.0)
MCH: 28 pg (ref 26.0–34.0)
MCHC: 31.1 g/dL — ABNORMAL LOW (ref 32.0–36.0)
MCV: 90.1 fL (ref 80.0–100.0)
Platelets: 157 10*3/uL (ref 150–440)
RBC: 3.64 MIL/uL — AB (ref 4.40–5.90)
RDW: 16.2 % — ABNORMAL HIGH (ref 11.5–14.5)
WBC: 5.4 10*3/uL (ref 3.8–10.6)

## 2015-03-09 MED ORDER — FUROSEMIDE 40 MG PO TABS
80.0000 mg | ORAL_TABLET | Freq: Two times a day (BID) | ORAL | Status: DC
  Administered 2015-03-09 – 2015-03-10 (×2): 80 mg via ORAL
  Filled 2015-03-09 (×2): qty 2

## 2015-03-09 NOTE — Care Management (Signed)
Attending informed CM and CSW anticipates discahrge 03/10/2015 to skilled nursing.  Updated [facility]

## 2015-03-09 NOTE — Consult Note (Signed)
Reason for Consult: Congestive Heart  Failure, shortness of breath Referring Physician:  Dr. Volanda Napoleon hospitalist  Helene Kelp Imre Vecchione. is an 81 y.o. male.  HPI:  81 year old obese black male history of congestive heart failure shortness of breath severe renal insufficiency hypertension presents with progressive shortness of breath some chest pain. Patient usually goes to [facility] for Nephrology treatment and evaluate. Patient presented with progressive shortness of breath, chest discomfort shortness of breath became worse at rest was seen in emergency room the patient was hypoxic oxygen was started patient appeared to be volume overloaded so heart failure treatment was begun.  Past Medical History  Diagnosis Date  . Chronic renal failure, stage 4 (severe)   . Hypertension   . CHF (congestive heart failure)   . Gout   . Glaucoma   . Neuropathy     History reviewed. No pertinent past surgical history.  Family History  Problem Relation Age of Onset  . Diabetes Mother     Social History:  reports that he has quit smoking. His smoking use included Cigarettes. He has a 3.75 pack-year smoking history. He does not have any smokeless tobacco history on file. He reports that he does not drink alcohol or use illicit drugs.  Allergies: No Known Allergies  Medications: I have reviewed the patient's current medications.  Results for orders placed or performed during the hospital encounter of 03/04/15 (from the past 48 hour(s))  CBC     Status: Abnormal   Collection Time: 03/08/15  6:45 AM  Result Value Ref Range   WBC 5.4 3.8 - 10.6 K/uL   RBC 3.72 (L) 4.40 - 5.90 MIL/uL   Hemoglobin 10.6 (L) 13.0 - 18.0 g/dL   HCT 33.7 (L) 40.0 - 52.0 %   MCV 90.4 80.0 - 100.0 fL   MCH 28.4 26.0 - 34.0 pg   MCHC 31.4 (L) 32.0 - 36.0 g/dL   RDW 15.8 (H) 11.5 - 14.5 %   Platelets 155 150 - 440 K/uL  Basic metabolic panel     Status: Abnormal   Collection Time: 03/08/15  6:45 AM  Result Value Ref Range   Sodium 141  135 - 145 mmol/L   Potassium 4.6 3.5 - 5.1 mmol/L   Chloride 102 101 - 111 mmol/L   CO2 32 22 - 32 mmol/L   Glucose, Bld 99 65 - 99 mg/dL   BUN 67 (H) 6 - 20 mg/dL   Creatinine, Ser 4.15 (H) 0.61 - 1.24 mg/dL   Calcium 8.7 (L) 8.9 - 10.3 mg/dL   GFR calc non Af Amer 13 (L) >60 mL/min   GFR calc Af Amer 15 (L) >60 mL/min    Comment: (NOTE) The eGFR has been calculated using the CKD EPI equation. This calculation has not been validated in all clinical situations. eGFR's persistently <60 mL/min signify possible Chronic Kidney Disease.    Anion gap 7 5 - 15  CBC     Status: Abnormal   Collection Time: 03/09/15  4:19 AM  Result Value Ref Range   WBC 5.4 3.8 - 10.6 K/uL   RBC 3.64 (L) 4.40 - 5.90 MIL/uL   Hemoglobin 10.2 (L) 13.0 - 18.0 g/dL   HCT 32.8 (L) 40.0 - 52.0 %   MCV 90.1 80.0 - 100.0 fL   MCH 28.0 26.0 - 34.0 pg   MCHC 31.1 (L) 32.0 - 36.0 g/dL   RDW 16.2 (H) 11.5 - 14.5 %   Platelets 157 150 - 440 K/uL  Basic metabolic  panel     Status: Abnormal   Collection Time: 03/09/15  4:19 AM  Result Value Ref Range   Sodium 139 135 - 145 mmol/L   Potassium 4.7 3.5 - 5.1 mmol/L   Chloride 98 (L) 101 - 111 mmol/L   CO2 30 22 - 32 mmol/L   Glucose, Bld 108 (H) 65 - 99 mg/dL   BUN 74 (H) 6 - 20 mg/dL   Creatinine, Ser 4.51 (H) 0.61 - 1.24 mg/dL   Calcium 8.5 (L) 8.9 - 10.3 mg/dL   GFR calc non Af Amer 11 (L) >60 mL/min   GFR calc Af Amer 13 (L) >60 mL/min    Comment: (NOTE) The eGFR has been calculated using the CKD EPI equation. This calculation has not been validated in all clinical situations. eGFR's persistently <60 mL/min signify possible Chronic Kidney Disease.    Anion gap 11 5 - 15    Nm Myocar Multi W/spect W/wall Motion / Ef  03/07/2015    There was no ST segment deviation noted during stress.  global depressed left ventricular function ejection fraction between 35  and 40%  apical defect no ischemia noted ischemia  Defect 1: There is a small defect of mild  severity present in the apex  location.  This is a low risk study.  Nuclear stress EF: 35%.  The left ventricular ejection fraction is moderately decreased (30-44%).     Review of Systems  Constitutional: Positive for malaise/fatigue.  HENT: Positive for congestion.   Eyes: Negative.   Respiratory: Positive for shortness of breath.   Cardiovascular: Positive for chest pain, orthopnea, leg swelling and PND.  Gastrointestinal: Positive for nausea and abdominal pain.  Genitourinary: Negative.   Musculoskeletal: Negative.   Skin: Negative.   Neurological: Positive for weakness.  Endo/Heme/Allergies: Negative.   Psychiatric/Behavioral: Negative.    Blood pressure 129/66, pulse 110, temperature 98.2 F (36.8 C), temperature source Oral, resp. rate 18, height _0  (1.88 m), weight 107.956 kg (238 lb), SpO2 92 %. Physical Exam  Constitutional: He is oriented to person, place, and time. He appears well-developed and well-nourished.  HENT:  Head: Normocephalic and atraumatic.  Right Ear: External ear normal.  Eyes: Conjunctivae and EOM are normal. Pupils are equal, round, and reactive to light.  Neck: Neck supple.  Cardiovascular: Normal rate and regular rhythm.  Exam reveals gallop.   Murmur heard. Respiratory: Effort normal and breath sounds normal.  GI: Soft. Bowel sounds are normal.  Musculoskeletal: Normal range of motion.  Neurological: He is alert and oriented to person, place, and time. He has normal reflexes.  Skin: Skin is warm and dry.  Psychiatric: He has a normal mood and affect.    Assessment/Plan:  Congestive Heart Failure  shortness of breath  hypertension  chronic renal insufficiency stage IV  obesity  neuropathy  gout  glaucoma  hyperlipidemia . PLAN  I agree with may been ruled out for myocardial infarction  continue Heart failure therapy with diuretics blood pressure control  agree with hydralazine and Imdur or for heart failure therapy  continue  intravenous Lasix at high dose for heart failure  continue metoprolol for blood pressure and heart failure therapy  hypertension control with amlodipine metoprolol hydralazine  echocardiogram would be helpful for assessment of LV function  agree with Lipitor therapy for lipid management  recommend Lexiscan Myoview for evaluation of ischemia  not a good cardiac catheterization candidate because of severe renal insufficiency  recommend Nephrology input for renal insufficiency management  follow up with Cardiology as an outpatient   CALLWOOD,DWAYNE D. 03/09/2015, 8:27 AM

## 2015-03-09 NOTE — Progress Notes (Signed)
Patient ID: Sean Delgado., male   DOB: 1933-08-03, 81 y.o.   MRN: [id number] [facility] PROGRESS NOTE  PCP: Bobbye Morton, MD  HPI/Subjective: Patient still has a little bit of shortness of breath. Some cough. Still feels a little bit weak.  Objective: Filed Vitals:   03/09/15 0800  BP: 129/66  Pulse: 110  Temp:   Resp: 18    Filed Weights   03/07/15 0435 03/08/15 0501 03/09/15 0525  Weight: 110.496 kg (243 lb 9.6 oz) 109.816 kg (242 lb 1.6 oz) 107.956 kg (238 lb)    ROS: Review of Systems  Constitutional: Negative for fever and chills.  Eyes: Negative for blurred vision.  Respiratory: Negative for cough and shortness of breath.   Cardiovascular: Negative for chest pain.  Gastrointestinal: Negative for nausea, vomiting, abdominal pain, diarrhea and constipation.  Genitourinary: Negative for dysuria.  Musculoskeletal: Negative for joint pain.  Neurological: Positive for weakness. Negative for dizziness and headaches.   Exam: Physical Exam  Constitutional: He is oriented to person, place, and time.  HENT:  Nose: No mucosal edema.  Mouth/Throat: No oropharyngeal exudate or posterior oropharyngeal edema.  Eyes: Conjunctivae, EOM and lids are normal. Pupils are equal, round, and reactive to light.  Neck: No JVD present. Carotid bruit is not present. No edema present. No thyroid mass and no thyromegaly present.  Cardiovascular: S1 normal and S2 normal.  Exam reveals no gallop.   Murmur heard.  Systolic murmur is present with a grade of 2/6  Pulses:      Dorsalis pedis pulses are 2+ on the right side, and 2+ on the left side.  Respiratory: No respiratory distress. He has no wheezes. He has no rhonchi. He has rales in the right lower field and the left lower field.  GI: Soft. Bowel sounds are normal. There is no tenderness.  Musculoskeletal:       Right ankle: He exhibits swelling.       Left ankle: He exhibits swelling.  Lymphadenopathy:    He has no  cervical adenopathy.  Neurological: He is alert and oriented to person, place, and time. No cranial nerve deficit.  Skin: Skin is warm. No rash noted. Nails show no clubbing.  Psychiatric: He has a normal mood and affect.    Data Reviewed: Basic Metabolic Panel:  Recent Labs Lab 03/06/15 0434 03/07/15 0439 03/08/15 0645 03/09/15 0419 03/09/15 1249  NA 143 140 141 139 139  K 4.8 4.9 4.6 4.7 4.2  CL 106 102 102 98* 97*  CO2 29 30 32 30 30  GLUCOSE 99 103* 99 108* 147*  BUN 54* 63* 67* 74* 71*  CREATININE 4.02* 4.07* 4.15* 4.51* 4.41*  CALCIUM 8.5* 8.8* 8.7* 8.5* 8.7*  PHOS  --  4.1  --   --   --    Liver Function Tests:  Recent Labs Lab 03/04/15 1212 03/07/15 0439  AST 28  --   ALT 20  --   ALKPHOS 89  --   BILITOT 0.8  --   PROT 7.9  --   ALBUMIN 3.4* 3.1*   CBC:  Recent Labs Lab 03/04/15 1212 03/05/15 0007 03/06/15 0434 03/07/15 0439 03/08/15 0645 03/09/15 0419  WBC 7.7 6.8 6.5 6.2 5.4 5.4  NEUTROABS 6.2  --   --   --   --   --   HGB 11.2* 10.3* 10.4* 10.5* 10.6* 10.2*  HCT 35.4* 33.4* 33.1* 33.6* 33.7* 32.8*  MCV 90.5 90.9 91.3 91.4 90.4 90.1  PLT 167 145* 146* 143* 155 157   BNP (last 3 results)  Recent Labs  03/04/15 1212  BNP 560.0*    Recent Results (from the past 240 hour(s))  Culture, blood (routine x 2)     Status: None   Collection Time: 03/04/15 12:12 PM  Result Value Ref Range Status   Specimen Description BLOOD  Final   Special Requests NONE  Final   Culture NO GROWTH 5 DAYS  Final   Report Status 03/09/2015 FINAL  Final  Culture, blood (routine x 2)     Status: None   Collection Time: 03/04/15 12:42 PM  Result Value Ref Range Status   Specimen Description BLOOD  Final   Special Requests NONE  Final   Culture NO GROWTH 5 DAYS  Final   Report Status 03/09/2015 FINAL  Final      Scheduled Meds: . allopurinol  100 mg Oral Daily  . amLODipine  10 mg Oral q1800   And  . atorvastatin  20 mg Oral q1800  . antiseptic oral  rinse  7 mL Mouth Rinse BID  . aspirin EC  81 mg Oral Daily  . brimonidine  1 drop Left Eye BID  . clopidogrel  75 mg Oral Daily  . ferrous sulfate  325 mg Oral BID  . furosemide  80 mg Oral BID  . heparin  5,000 Units Subcutaneous 3 times per day  . hydrALAZINE  75 mg Oral TID  . isosorbide mononitrate  60 mg Oral Daily  . latanoprost  1 drop Both Eyes QHS  . metoprolol  200 mg Oral Daily  . multivitamin with minerals   Oral Daily  . pregabalin  75 mg Oral BID  . senna-docusate  2 tablet Oral QHS  . sodium bicarbonate  650 mg Oral BID  . sodium chloride  3 mL Intravenous Q12H  . trolamine salicylate  1 application Topical QID    Assessment/Plan:  1. Acute respiratory failure with hypoxia- I would check her pulse ox on room air in the a.m. after ambulation. Patient still on oxygen supplementation today. 2. Acute diastolic congestive heart failure- patient was switched over to oral Lasix yesterday evening. Continue oral diuresis. Patient is on metoprolol, hydralazine and indoor 3. Chronic kidney disease stage IV- nephrology following while here and will need close follow-up as outpatient 4. Essential hypertension- blood pressure is been stable on medication regimen. 5. Chronic gout- on renally dosed allopurinol 6. Hyperlipidemia unspecified on atorvastatin 7. Glaucoma unspecified continue eyedrops.  Code Status:     Code Status Orders        Start     Ordered   03/04/15 1614  Do not attempt resuscitation (DNR)   Continuous    Question Answer Comment  In the event of cardiac or respiratory ARREST Do not call a "code blue"   In the event of cardiac or respiratory ARREST Do not perform Intubation, CPR, defibrillation or ACLS   In the event of cardiac or respiratory ARREST Use medication by any route, position, wound care, and other measures to relive pain and suffering. May use oxygen, suction and manual treatment of airway obstruction as needed for comfort.      03/04/15 1613     Advance Directive Documentation        Most Recent Value   Type of Advance Directive  Living will   Pre-existing out of facility DNR order (yellow form or pink MOST form)     "MOST" Form in Place?  Disposition Plan: Hopefully to rehabilitation tomorrow  Time spent: 30 minutes  Alford Highland  [facility] Hospitalists

## 2015-03-09 NOTE — Consult Note (Signed)
S: No events overnight. He is comfortable. He denies SOB and appetite is goo.  Did get out of bed last evening with PT.  Plan to d/c to SNF tomorrow 9/2.    Receiving Lasix 80 mg po BID  O: Blood pressure 129/66, pulse 110, temperature 98.2 F (36.8 C), temperature source Oral, resp. rate 18, height  (1.88 m), weight 238 lb (107.956 kg), SpO2 92 %.  General: alert, eating his lunch Lungs: decrease bs at bases, no crackles CV: No rub Ext: No edema Neuro: oriented to place, month and year  LABS:  BMP Latest Ref Rng 03/09/2015 03/08/2015 03/07/2015  Glucose 65 - 99 mg/dL 161(W) 99 960(A)  BUN 6 - 20 mg/dL 54(U) 98(J) 19(J)  Creatinine 0.61 - 1.24 mg/dL 4.78(G) 9.56(O) 1.30(Q)  Sodium 135 - 145 mmol/L 139 141 140  Potassium 3.5 - 5.1 mmol/L 4.7 4.6 4.9  Chloride 101 - 111 mmol/L 98(L) 102 102  CO2 22 - 32 mmol/L 30 32 30  Calcium 8.9 - 10.3 mg/dL 6.5(H) 8.4(O) 9.6(E)      A/P: 81 year old man with CKD4, baseline Cr around 4. Admitted with pulm edema  1) CKD4- Mild bump in Scr.  He has been up to 4.41 in the past 6 mo.  Would advise repeat BMP this pm (ordered for ~ 1400).  No indications for dialysis. He has f/up in the [facility] on Sept 19th at 1pm with Kennith Gain.  At that time, will try to move up his appt with Dr Wyn Quaker for consultation for perm access placement as he will need dialysis in the future.  2) HTN- Controlled on current regimen.  3) Volume Status- Changed to oral lasix last evening 8/31.   Looks good from fluid status. On 80 mg PO bid and I feel that bump in Scr is probably due to effects of IV lasix. Will keep dose as  BID (adjusted dosing to 0800 and 1600) and will hold if Scr continues to trend up this pm.  His O2 requirement has decreased.  Of note, pt was on 40 mg bid as outpt.  4) Anemia- No indication for ESA  I will be covering on 9/1 and 9/2.  Discussed with Dr Renae Gloss

## 2015-03-09 NOTE — Progress Notes (Signed)
Physical Therapy Treatment Patient Details Name: Sean Delgado. MRN: [id number] DOB: 06-24-1934 Today's Date: March 28, 2015    History of Present Illness Pt is a 81 y.o. male presenting to hospital with SOB and worsening LE edema.  Pt admitted with CHF-acute on chronic diastolic dysfunction.  PMH:  CAD, htn, h/o CHF, gout, glaucoma, peripheral neuropathy, CKD stage IV.    PT Comments    Pt was able to work on there ex and bed mob specifically to scoot up in bed and use the skills he had practiced with exercise.  He is feeling tired today and will encourage him to be OOB next visit regardless of the time he spends resting versus being active.  Plan remains to increase walking and balance programs.  Follow Up Recommendations  SNF     Equipment Recommendations  None recommended by PT    Recommendations for Other Services       Precautions / Restrictions Precautions Precautions: Fall Precaution Comments: monitor O2 Restrictions Weight Bearing Restrictions: No    Mobility  Bed Mobility Overal bed mobility: Needs Assistance Bed Mobility:  (scooting up in bed)     Supine to sit: Mod assist;Max assist     General bed mobility comments: used trendelenburg and mod max to pull pt up with pt offering a little help to push with legs  Transfers                 General transfer comment: declined  Ambulation/Gait             General Gait Details: declined   Information systems manager [employer] (Stroke Patients Only)       Balance                                    Cognition Arousal/Alertness: Awake/alert Behavior During Therapy: WFL for tasks assessed/performed Overall Cognitive Status: No family/caregiver present to determine baseline cognitive functioning                      Exercises General Exercises - Lower Extremity Ankle Circles/Pumps: Both;10 reps Quad Sets: AROM;Both;15 reps Gluteal Sets:  AROM;Both;15 reps Heel Slides: Strengthening;Both;15 reps Hip ABduction/ADduction: Strengthening;Both;15 reps    General Comments        Pertinent Vitals/Pain Pain Assessment: No/denies pain    Home Living                      Prior Function            PT Goals (current goals can now be found in the care plan section) Progress towards PT goals: Progressing toward goals    Frequency  Min 2X/week    PT Plan Current plan remains appropriate    Co-evaluation             End of Session   Activity Tolerance: Patient tolerated treatment well Patient left: in bed;with call bell/phone within reach;with bed alarm set     Time: 1521-1545 PT Time Calculation (min) (ACUTE ONLY): 24 min  Charges:  $Therapeutic Exercise: 8-22 mins $Therapeutic Activity: 8-22 mins                    G Codes:      Sean Delgado 28-Mar-2015, 6:13 PM   Sean Delgado, PT MS Acute  Rehab Dept. Number: [facility] [id number] and [place] [phone]

## 2015-03-10 LAB — BASIC METABOLIC PANEL
ANION GAP: 10 (ref 5–15)
Anion gap: 10 (ref 5–15)
BUN: 78 mg/dL — AB (ref 6–20)
BUN: 75 mg/dL — ABNORMAL HIGH (ref 6–20)
CALCIUM: 8.5 mg/dL — AB (ref 8.9–10.3)
CALCIUM: 8.4 mg/dL — AB (ref 8.9–10.3)
CO2: 31 mmol/L (ref 22–32)
CO2: 30 mmol/L (ref 22–32)
CREATININE: 4.92 mg/dL — AB (ref 0.61–1.24)
CREATININE: 4.71 mg/dL — AB (ref 0.61–1.24)
Chloride: 99 mmol/L — ABNORMAL LOW (ref 101–111)
Chloride: 99 mmol/L — ABNORMAL LOW (ref 101–111)
GFR calc Af Amer: 12 mL/min — ABNORMAL LOW (ref >60)
GFR calc Af Amer: 12 mL/min — ABNORMAL LOW (ref >60)
GFR calc non Af Amer: 10 mL/min — ABNORMAL LOW (ref >60)
GFR calc non Af Amer: 11 mL/min — ABNORMAL LOW (ref >60)
GLUCOSE: 100 mg/dL — AB (ref 65–99)
GLUCOSE: 134 mg/dL — AB (ref 65–99)
Potassium: 4.8 mmol/L (ref 3.5–5.1)
Potassium: 4.5 mmol/L (ref 3.5–5.1)
Sodium: 140 mmol/L (ref 135–145)
Sodium: 139 mmol/L (ref 135–145)

## 2015-03-10 NOTE — Care Management (Signed)
Patient seen by nephrology and making recommendation to reassess renal status on 9/3. It is documented that there is no indication for acute dialysis.  BUN and Creatine continue to slowly rise. Lasix is on hold.  It is documented in medical record that patient has been "nearing need for chronic hemodialysis."  Discussed with attending that  If the renal status monitoring consists of lab monitoring, this could be done from skilled nursing facility.  If it becomes necessary to initiate chronic dialysis for esrd- this can be initiated through clinic.  Patient will be monitored for response on cardiac status with the discontinuation of lasix.  Sean Delgado has been updated

## 2015-03-10 NOTE — Progress Notes (Signed)
Initial Nutrition Assessment       INTERVENTION:  Meals and snacks: cater to pt preferences   NUTRITION DIAGNOSIS:   Food and nutrition related knowledge deficit related to chronic illness as evidenced by per patient/family report, tolerating solid foods well    GOAL:   Patient will meet greater than or equal to 90% of their needs  Meeting nutritional needs  MONITOR:    (Energy Intake, Anthropometrics, Electrolyte and renal Profile, Pulmonary Profile)  REASON FOR ASSESSMENT:   Diagnosis    ASSESSMENT:   Pt admitted with SOB secondary to volume overload with h/o CHF and CKD stage IV.   Current Nutrition: eating 100% of meals and tolerating well   Gastrointestinal Profile: Last BM: 9/01   Medications: Fe sulfate, MVI, senekot , Na bicarbonate  Electrolyte/Renal Profile and Glucose Profile:   Recent Labs Lab 03/07/15 0439  03/09/15 0419 03/09/15 1249 03/10/15 0802  NA 140  < > 139 139 140  K 4.9  < > 4.7 4.2 4.8  CL 102  < > 98* 97* 99*  CO2 30  < > BUN 63*  < > 74* 71* 78*  CREATININE 4.07*  < > 4.51* 4.41* 4.92*  CALCIUM 8.8*  < > 8.5* 8.7* 8.5*  PHOS 4.1  --   --   --   --   GLUCOSE 103*  < > 108* 147* 100*  < > = values in this interval not displayed. Protein Profile:  Recent Labs Lab 03/04/15 1212 03/07/15 0439  ALBUMIN 3.4* 3.1*       Weight Trend since Admission: Filed Weights   03/08/15 0501 03/09/15 0525 03/10/15 0513  Weight: 242 lb 1.6 oz (109.816 kg) 238 lb (107.956 kg) 239 lb 6.4 oz (108.591 kg)      Diet Order:  Diet renal with fluid restriction Fluid restriction:: 1200 mL Fluid; Room service appropriate?: Yes; Fluid consistency:: Thin  Skin:  Reviewed, no issues   Height:   Ht Readings from Last 1 Encounters:  03/04/15  (1.88 m)    Weight:   Wt Readings from Last 1 Encounters:  03/10/15 239 lb 6.4 oz (108.591 kg)    Ideal Body Weight:  86 kg  BMI:  Body mass index is 30.72  kg/(m^2).  Estimated Nutritional Needs:   Kcal:  BEE: 1645kcals, TEE: (IF 1.0-1.2)(AF 1.3) 2138-2566kcals  Protein:  69-86g protein (0.8-1.0g/kg)  Fluid:  2150-2559mL of fluid (25-69mL/kg)  EDUCATION NEEDS:   Education needs addressed  LOW Care Level  Peyton Spengler B. Freida Busman, RD, LDN [phone] (pager)

## 2015-03-10 NOTE — Plan of Care (Signed)
S: Sean Delgado continues on 2L [place] O2 sat 94%.  Yesterday, Scr bumped to 4.5 with no change pm 9/1 but with increase to 4.9 today.     O: Today's Vitals   03/09/15 2037 03/09/15 2219 03/10/15 0513 03/10/15 0827  BP: 134/57  125/61 135/84  Pulse: 96  87 90  Temp: 98 F (36.7 C)  98 F (36.7 C) 98.1 F (36.7 C)  TempSrc:    Oral  Resp: Height:      Weight:   239 lb 6.4 oz (108.591 kg)   SpO2: 91%  96% 94%  PainSc:  0-No pain     Assessment and Recommendations: 89 AAM with CKD4 at baseline presented with acute onset SOB and pulmonary edema.  ECHO shows pEF with moderate mitral regurgitation. BP good.  Home diuretic regimen  BID.  On admission, he received Lasix  IV q 8hr with improvement in symptoms and decreased O2 requirement.  Now with Acute on CKD  1) A on CKD: likely due to overdiuresis.   - Agree with holding Lasix although it will need to be restarted chronically.   - bladder scan to ensure not retaining urine, repeat post void if >337ml - no acute indication for dialysis - repeat pm labs today and in am - Followup appt [facility] at [facility] on Sept 19th at 1pm with Kennith Gain.  2) Discharge planning:  - would advise delaying discharge to SNF until trend of Scr stabilizes and we can ensure he has no dialysis need   3) HTN: controlled. - continue metoprolol, hydralazine, ISMN   Please call or page with questions Frederick Peers, MD C: [phone] Pager: [phone]

## 2015-03-10 NOTE — Clinical Documentation Improvement (Signed)
Hospitalist  Possible Clinical Conditions: - Hypertensive Heart Disease - Other condition - Unable to clinically determine  Clinical Information: Patient has a documented history of Hypertension per current medical record. Moderate cardiac enlargement is noted on the patient's CXR dated 03/06/15. Patient was admitted with acute diastolic heart failure EF 55-60% by Echo this admission EF 35 by nuclear stress test this admission.   Please exercise your independent, professional judgment when responding. A specific answer is not anticipated or expected.  Thank You, Jerral Ralph RN BSN CCDS [phone] Health Information Management [place]

## 2015-03-10 NOTE — Progress Notes (Signed)
Patient ID: Arnel Wymer., male   DOB: [birth year], 81 y.o.   MRN: [id number] [facility] PROGRESS NOTE  PCP: Bobbye Morton, MD  HPI/Subjective: Patient thinks his breathing is better today. Offers no complaints. He states that he is urinating well.  Objective: Filed Vitals:   03/10/15 1145  BP: 134/81  Pulse: 96  Temp: 98.4 F (36.9 C)  Resp: 18    Filed Weights   03/08/15 0501 03/09/15 0525 03/10/15 0513  Weight: 109.816 kg (242 lb 1.6 oz) 107.956 kg (238 lb) 108.591 kg (239 lb 6.4 oz)    ROS: Review of Systems  Constitutional: Negative for fever and chills.  Eyes: Negative for blurred vision.  Respiratory: Negative for cough and shortness of breath.   Cardiovascular: Negative for chest pain.  Gastrointestinal: Negative for nausea, vomiting, abdominal pain, diarrhea and constipation.  Genitourinary: Negative for dysuria.  Musculoskeletal: Negative for joint pain.  Neurological: Positive for weakness. Negative for dizziness and headaches.   Exam: Physical Exam  Constitutional: He is oriented to person, place, and time.  HENT:  Nose: No mucosal edema.  Mouth/Throat: No oropharyngeal exudate or posterior oropharyngeal edema.  Eyes: Conjunctivae, EOM and lids are normal. Pupils are equal, round, and reactive to light.  Neck: No JVD present. Carotid bruit is not present. No edema present. No thyroid mass and no thyromegaly present.  Cardiovascular: S1 normal and S2 normal.  Exam reveals no gallop.   Murmur heard.  Systolic murmur is present with a grade of 2/6  Pulses:      Dorsalis pedis pulses are 2+ on the right side, and 2+ on the left side.  Respiratory: No respiratory distress. He has no wheezes. He has no rhonchi. He has no rales.  GI: Soft. Bowel sounds are normal. There is no tenderness.  Musculoskeletal:       Right ankle: He exhibits swelling.       Left ankle: He exhibits swelling.  Lymphadenopathy:    He has no cervical adenopathy.   Neurological: He is alert and oriented to person, place, and time. No cranial nerve deficit.  Skin: Skin is warm. No rash noted. Nails show no clubbing.  Psychiatric: He has a normal mood and affect.    Data Reviewed: Basic Metabolic Panel:  Recent Labs Lab 03/07/15 0439 03/08/15 0645 03/09/15 0419 03/09/15 1249 03/10/15 0802  NA 140 141 139 139 140  K 4.9 4.6 4.7 4.2 4.8  CL 102 102 98* 97* 99*  CO2 30 32 30 30 31   GLUCOSE 103* 99 108* 147* 100*  BUN 63* 67* 74* 71* 78*  CREATININE 4.07* 4.15* 4.51* 4.41* 4.92*  CALCIUM 8.8* 8.7* 8.5* 8.7* 8.5*  PHOS 4.1  --   --   --   --    Liver Function Tests:  Recent Labs Lab 03/04/15 1212 03/07/15 0439  AST 28  --   ALT 20  --   ALKPHOS 89  --   BILITOT 0.8  --   PROT 7.9  --   ALBUMIN 3.4* 3.1*   CBC:  Recent Labs Lab 03/04/15 1212 03/05/15 0007 03/06/15 0434 03/07/15 0439 03/08/15 0645 03/09/15 0419  WBC 7.7 6.8 6.5 6.2 5.4 5.4  NEUTROABS 6.2  --   --   --   --   --   HGB 11.2* 10.3* 10.4* 10.5* 10.6* 10.2*  HCT 35.4* 33.4* 33.1* 33.6* 33.7* 32.8*  MCV 90.5 90.9 91.3 91.4 90.4 90.1  PLT 167 145* 146* 143* 155 157  BNP (last 3 results)  Recent Labs  03/04/15 1212  BNP 560.0*    Recent Results (from the past 240 hour(s))  Culture, blood (routine x 2)     Status: None   Collection Time: 03/04/15 12:12 PM  Result Value Ref Range Status   Specimen Description BLOOD  Final   Special Requests NONE  Final   Culture NO GROWTH 5 DAYS  Final   Report Status 03/09/2015 FINAL  Final  Culture, blood (routine x 2)     Status: None   Collection Time: 03/04/15 12:42 PM  Result Value Ref Range Status   Specimen Description BLOOD  Final   Special Requests NONE  Final   Culture NO GROWTH 5 DAYS  Final   Report Status 03/09/2015 FINAL  Final      Scheduled Meds: . allopurinol  100 mg Oral Daily  . amLODipine  10 mg Oral q1800   And  . atorvastatin  20 mg Oral q1800  . antiseptic oral rinse  7 mL Mouth Rinse  BID  . aspirin EC  81 mg Oral Daily  . brimonidine  1 drop Left Eye BID  . clopidogrel  75 mg Oral Daily  . ferrous sulfate  325 mg Oral BID  . heparin  5,000 Units Subcutaneous 3 times per day  . hydrALAZINE  75 mg Oral TID  . isosorbide mononitrate  60 mg Oral Daily  . latanoprost  1 drop Both Eyes QHS  . metoprolol  200 mg Oral Daily  . multivitamin with minerals   Oral Daily  . pregabalin  75 mg Oral BID  . senna-docusate  2 tablet Oral QHS  . sodium bicarbonate  650 mg Oral BID  . sodium chloride  3 mL Intravenous Q12H  . trolamine salicylate  1 application Topical QID    Assessment/Plan:  1. Acute renal failure on chronic kidney disease stage IV - nephrology wanted to hold off on discharge today and check kidney function later today and tomorrow morning. Holding Lasix today. [name] GFR is 12 and close to dialysis range.  2. Acute respiratory failure with hypoxia- check pulse ox on room air and hopefully we can get him off the oxygen 3. Acute diastolic congestive heart failure- holding Lasix. Patient is on metoprolol, hydralazine and imdur. Not quite sure what to make the difference in ejection fraction with the echocardiogram and the stress test. But other way no ACE inhibitor at this point. 4. Essential hypertension- blood pressure is been stable on medication regimen. 5. Chronic gout- on renally dosed allopurinol 6. Hyperlipidemia unspecified on atorvastatin 7. Glaucoma unspecified continue eyedrops.  Code Status:     Code Status Orders        Start     Ordered   03/04/15 1614  Do not attempt resuscitation (DNR)   Continuous    Question Answer Comment  In the event of cardiac or respiratory ARREST Do not call a "code blue"   In the event of cardiac or respiratory ARREST Do not perform Intubation, CPR, defibrillation or ACLS   In the event of cardiac or respiratory ARREST Use medication by any route, position, wound care, and other measures to relive pain and  suffering. May use oxygen, suction and manual treatment of airway obstruction as needed for comfort.      03/04/15 1613    Advance Directive Documentation        Most Recent Value   Type of Advance Directive  Living will   Pre-existing  out of facility DNR order (yellow form or pink MOST form)     "MOST" Form in Place?       Disposition Plan: Hopefully to rehabilitation soon.  Time spent: 20 minutes  Alford Highland  [facility]

## 2015-03-10 NOTE — Care Management Important Message (Signed)
Important Message  Patient Details  Name: Sean Delgado. MRN: [id number] Date of Birth: 1934/06/08   Medicare Important Message Given:  Yes-fourth notification given    [name] 03/10/2015, 10:06 AM

## 2015-03-10 NOTE — Clinical Social Work Note (Signed)
CSW notified facility that pt would DC over the weekend.  This is fine per facility.

## 2015-03-11 LAB — BASIC METABOLIC PANEL
Anion gap: 9 (ref 5–15)
BUN: 76 mg/dL — AB (ref 6–20)
CHLORIDE: 101 mmol/L (ref 101–111)
CO2: 30 mmol/L (ref 22–32)
CREATININE: 4.62 mg/dL — AB (ref 0.61–1.24)
Calcium: 8.4 mg/dL — ABNORMAL LOW (ref 8.9–10.3)
GFR calc Af Amer: 13 mL/min — ABNORMAL LOW (ref >60)
GFR calc non Af Amer: 11 mL/min — ABNORMAL LOW (ref >60)
Glucose, Bld: 116 mg/dL — ABNORMAL HIGH (ref 65–99)
Potassium: 4.9 mmol/L (ref 3.5–5.1)
Sodium: 140 mmol/L (ref 135–145)

## 2015-03-11 MED ORDER — FUROSEMIDE 20 MG PO TABS: ORAL_TABLET | ORAL | Status: DC

## 2015-03-11 NOTE — Clinical Social Work Placement (Signed)
   CLINICAL SOCIAL WORK PLACEMENT  NOTE  Date:  03/11/2015  Patient Details  Name: Sean Delgado. MRN: [id number] Date of Birth: 17-Aug-1933  Clinical Social Work is seeking post-discharge placement for this patient at the Skilled  Nursing Facility level of care (*CSW will initial, date and re-position this form in  chart as items are completed):  Yes   Patient/family provided with [facility] Clinical Social Work Department's list of facilities offering this level of care within the geographic area requested by the patient (or if unable, by the patient's family).  Yes   Patient/family informed of their freedom to choose among providers that offer the needed level of care, that participate in Medicare, Medicaid or managed care program needed by the patient, have an available bed and are willing to accept the patient.  Yes   Patient/family informed of [facility]'s ownership interest in [facility] and [facility], as well as of the fact that they are under no obligation to receive care at these facilities.  PASRR submitted to EDS on       PASRR number received on       Existing PASRR number confirmed on 03/06/15     FL2 transmitted to all facilities in geographic area requested by pt/family on 03/06/15     FL2 transmitted to all facilities within larger geographic area on       Patient informed that his/her managed care company has contracts with or will negotiate with certain facilities, including the following:        Yes   Patient/family informed of bed offers received.  Patient chooses bed at  [facility] )     Physician recommends and patient chooses bed at      Patient to be transferred to  [facility] ) on 03/11/15.  Patient to be transferred to facility by  [facility] )     Patient family notified on 03/11/15 of transfer.  Name of family member notified:   (CSW left 3 vociemails for patient's niece, sister, and Felicia listed in facesheet.  )     PHYSICIAN       Additional Comment:    _______________________________________________ Haig Prophet, LCSW 03/11/2015, 12:27 PM

## 2015-03-11 NOTE — Progress Notes (Signed)
Attempted to contact RN at [facility] for report, was informed RN is currently unavailable. Will attempt to call report at a later time. Also placed a phone call to patient's daughter Sunny Schlein (number given to RN by patient) no answer. VM left stating pt will be discharged, nurse # given for call back.

## 2015-03-11 NOTE — Progress Notes (Signed)
Report given to [facility]. EMS contacted for non emergent transfer.

## 2015-03-11 NOTE — Discharge Summary (Addendum)
[facility] - [place] [facility] [place] [facility]   PATIENT NAME: Sean Delgado    MR#:  [id number]  DATE OF BIRTH:  June 27, 1934  DATE OF ADMISSION:  03/04/2015 ADMITTING PHYSICIAN: [place] Siren, MD  DATE OF DISCHARGE: 03/11/2015  PRIMARY CARE PHYSICIAN: Bobbye Morton, MD    ADMISSION DIAGNOSIS:  Community acquired pneumonia [J18.9] Hypoxia [R09.02] Acute exacerbation of CHF (congestive heart failure) [I50.9]  DISCHARGE DIAGNOSIS:  Active Problems:   CHF (congestive heart failure)   SECONDARY DIAGNOSIS:   Past Medical History  Diagnosis Date  . Chronic renal failure, stage 4 (severe)   . Hypertension   . CHF (congestive heart failure)   . Gout   . Glaucoma   . Neuropathy     HOSPITAL COURSE:   1. Acute respiratory failure with hypoxia- patient has been on oxygen the entire hospital stay. I'm currently checking a pulse ox now on room air to see if he can go out to rehabilitation without oxygen. 2. Acute diastolic congestive heart failure. Patient was on a Lasix holiday with worsening creatinine. Patient is on metoprolol hydralazine and imdur. EF per echocardiogram was 55-60%. EF on stress test was 35%. Not sure what to make of this difference. I cannot give ACE inhibitor at this point with a creatinine changes. Restart Lasix 60 mg twice a day starting 03/12/2015. Check a BMP weekly on Mondays with results to go to [facility] nephrology. Daily weights recommended. 3. Acute renal failure on chronic kidney disease stage IV. Creatinine worsened to 4.92 on 03/10/2015 and had improved to 4.62 on 03/11/2015. I spoke with [facility] nephrology on call and they were okay with me discharging out of the hospital to rehabilitation with labs on Monday. I spoke with him about setting up dialysis access as outpatient. Close clinical follow-up as outpatient needed to avoid hospitalizations for heart failure and to monitor her needs for future dialysis. 4. Essential hypertension blood  pressure stable on current regimen 5. Chronic gout on renally dosed allopurinol 6. Hyperlipidemia unspecified on atorvastatin 7. Glycol, unspecified on his usual eyedrops.  DISCHARGE CONDITIONS:   Satisfactory  CONSULTS OBTAINED:  Treatment Team:  Mosetta Pigeon, MD Raven Lavonne Chick, MD Alwyn Pea, MD  DRUG ALLERGIES:  No Known Allergies  DISCHARGE MEDICATIONS:   Current Discharge Medication List    CONTINUE these medications which have CHANGED   Details  furosemide (LASIX) 20 MG tablet 3 tablets twice a day starting 03/12/2015 Qty: 180 tablet, Refills: 0      CONTINUE these medications which have NOT CHANGED   Details  acetaminophen (TYLENOL) 325 MG tablet Take 650 mg by mouth every 6 (six) hours as needed. For pain.    allopurinol (ZYLOPRIM) 100 MG tablet Take 100 mg by mouth daily.    amlodipine-atorvastatin (CADUET) 10-20 MG per tablet Take 1 tablet by mouth daily.    aspirin EC 81 MG tablet Take 81 mg by mouth daily.    brimonidine (ALPHAGAN) 0.2 % ophthalmic solution Place 1 drop into the left eye 2 (two) times daily.    clopidogrel (PLAVIX) 75 MG tablet Take 75 mg by mouth daily.    ferrous sulfate 325 (65 FE) MG tablet Take 325 mg by mouth 2 (two) times daily.    hydrALAZINE (APRESOLINE) 50 MG tablet Take 75 mg by mouth 3 (three) times daily.    isosorbide mononitrate (IMDUR) 60 MG 24 hr tablet Take 60 mg by mouth daily.    latanoprost (XALATAN) 0.005 % ophthalmic solution Place  1 drop into both eyes at bedtime.    metoprolol (TOPROL-XL) 200 MG 24 hr tablet Take 200 mg by mouth daily.    Multiple Vitamins-Minerals (CENTRUM SILVER PO) Take 1 tablet by mouth daily.    pregabalin (LYRICA) 75 MG capsule Take 75 mg by mouth 2 (two) times daily.    senna-docusate (SENOKOT-S) 8.6-50 MG per tablet Take 2 tablets by mouth at bedtime.    Skin Protectants, Misc. (A+D FIRST AID) OINT Apply 1 application topically daily. Apply to feet    sodium bicarbonate 650  MG tablet Take 650 mg by mouth 2 (two) times daily.    sodium polystyrene (KAYEXALATE) 15 GM/60ML suspension Take 60 mLs by mouth 2 (two) times a week. Take on Monday and Friday.    tolnaftate (TINACTIN) 1 % spray Apply 1 application topically daily. Spray on both feet    Trolamine Salicylate 10 % LOTN Apply 1 application topically 4 (four) times daily. May apply this medication up to 4 times a day.         DISCHARGE INSTRUCTIONS:   Follow-up 1-2 days with PMD from the pace program Follow-up one week [facility] nephrology Recommend laboratory BMP on Mondays with results to go to [facility] nephrology.  If you experience worsening of your admission symptoms, develop shortness of breath, life threatening emergency, suicidal or homicidal thoughts you must seek medical attention immediately by calling 911 or calling your MD immediately  if symptoms less severe.  You Must read complete instructions/literature along with all the possible adverse reactions/side effects for all the Medicines you take and that have been prescribed to you. Take any new Medicines after you have completely understood and accept all the possible adverse reactions/side effects.   Please note  You were cared for by a hospitalist during your hospital stay. If you have any questions about your discharge medications or the care you received while you were in the hospital after you are discharged, you can call the unit and asked to speak with the hospitalist on call if the hospitalist that took care of you is not available. Once you are discharged, your primary care physician will handle any further medical issues. Please note that NO REFILLS for any discharge medications will be authorized once you are discharged, as it is imperative that you return to your primary care physician (or establish a relationship with a primary care physician if you do not have one) for your aftercare needs so that they can reassess your need for medications and  monitor your lab values.    Today   CHIEF COMPLAINT:   Chief Complaint  Patient presents with  . Shortness of Breath    HISTORY OF PRESENT ILLNESS:  Sean Delgado  is a 81 y.o. male with a known history of chronic kidney disease stage IV, congestive heart failure. Presented with shortness of breath and was found in congestive heart failure   VITAL SIGNS:  Blood pressure 136/76, pulse 68, temperature 97.4 F (36.3 C), temperature source Oral, resp. rate 25, height 6\' 2"  (1.88 m), weight 108.773 kg (239 lb 12.8 oz), SpO2 92 %.    PHYSICAL EXAMINATION:  GENERAL:  81 y.o.-year-old patient lying in the bed with no acute distress.  EYES: Pupils equal, round, reactive to light and accommodation. No scleral icterus. Extraocular muscles intact.  HEENT: Head atraumatic, normocephalic. Oropharynx and nasopharynx clear.  NECK:  Supple, no jugular venous distention. No thyroid enlargement, no tenderness.  LUNGS: Normal breath sounds bilaterally, no wheezing, rales,rhonchi or  crepitation. No use of accessory muscles of respiration.  CARDIOVASCULAR: S1, S2 normal. 2/6 systolic murmur, no rubs, or gallops.  ABDOMEN: Soft, non-tender, non-distended. Bowel sounds present. No organomegaly or mass.  EXTREMITIES: Trace edema, no cyanosis, or clubbing.  NEUROLOGIC: Cranial nerves II through XII are intact. Muscle strength 5/5 in all extremities. Sensation intact. Gait not checked.  PSYCHIATRIC: The patient is alert and oriented x 3.  SKIN: No obvious rash, lesion, or ulcer.   DATA REVIEW:   CBC  Recent Labs Lab 03/09/15 0419  WBC 5.4  HGB 10.2*  HCT 32.8*  PLT 157    Chemistries   Recent Labs Lab 03/04/15 1212  03/11/15 0421  NA 143  < > 140  K 4.9  < > 4.9  CL 108  < > 101  CO2 25  < > 30  GLUCOSE 116*  < > 116*  BUN 51*  < > 76*  CREATININE 4.24*  < > 4.62*  CALCIUM 8.5*  < > 8.4*  AST 28  --   --   ALT 20  --   --   ALKPHOS 89  --   --   BILITOT 0.8  --   --   < > =  values in this interval not displayed.  Cardiac Enzymes  Recent Labs Lab 03/05/15 0007  TROPONINI <0.03    Microbiology Results  Results for orders placed or performed during the hospital encounter of 03/04/15  Culture, blood (routine x 2)     Status: None   Collection Time: 03/04/15 12:12 PM  Result Value Ref Range Status   Specimen Description BLOOD  Final   Special Requests NONE  Final   Culture NO GROWTH 5 DAYS  Final   Report Status 03/09/2015 FINAL  Final  Culture, blood (routine x 2)     Status: None   Collection Time: 03/04/15 12:42 PM  Result Value Ref Range Status   Specimen Description BLOOD  Final   Special Requests NONE  Final   Culture NO GROWTH 5 DAYS  Final   Report Status 03/09/2015 FINAL  Final     Management plans discussed with the patient, and [facility] nephrology and they are in agreement.  CODE STATUS:     Code Status Orders        Start     Ordered   03/04/15 1614  Do not attempt resuscitation (DNR)   Continuous    Question Answer Comment  In the event of cardiac or respiratory ARREST Do not call a "code blue"   In the event of cardiac or respiratory ARREST Do not perform Intubation, CPR, defibrillation or ACLS   In the event of cardiac or respiratory ARREST Use medication by any route, position, wound care, and other measures to relive pain and suffering. May use oxygen, suction and manual treatment of airway obstruction as needed for comfort.      03/04/15 1613    Advance Directive Documentation        Most Recent Value   Type of Advance Directive  Living will   Pre-existing out of facility DNR order (yellow form or pink MOST form)     "MOST" Form in Place?        TOTAL TIME TAKING CARE OF THIS PATIENT: 35 minutes.    Alford Highland M.D on 03/11/2015 at 9:23 AM  Between 7am to 6pm - Pager - [phone]  After 6pm go to [email or web address] - password [email or web address] [facility]  Office  [phone]  CC: Primary care  physician; Bobbye Morton, MD

## 2015-03-11 NOTE — Progress Notes (Addendum)
Patient is medically stable for D/C to [facility] today. Per RN at [facility] patient is going to room 117-A. RN will call report and arrange EMS for transport. Clinical Child psychotherapist (CSW) prepared D/C packet and faxed D/C Summary to [employer] and [employer]. CSW contacted Okey Regal the on call [facility] RN and made her aware of above. Patient is aware of above. CSW attempted to contact patient's sister and niece and left voicemails. Please reconsult if future social work needs arise. CSW signing off.   [name] sister Rivka Barbara called CSW back. CSW made Glenda aware of D/C today.   Jetta Lout, LCSWA [phone]

## 2015-03-11 NOTE — Discharge Instructions (Signed)
Acute Respiratory Failure °Respiratory failure is when your lungs are not working well and your breathing (respiratory) system fails. When respiratory failure occurs, it is difficult for your lungs to get enough oxygen, get rid of carbon dioxide, or both. Respiratory failure can be life threatening.  °Respiratory failure can be acute or chronic. Acute respiratory failure is sudden, severe, and requires emergency medical treatment. Chronic respiratory failure is less severe, happens over time, and requires ongoing treatment.  °WHAT ARE THE CAUSES OF ACUTE RESPIRATORY FAILURE?  °Any problem affecting the heart or lungs can cause acute respiratory failure. Some of these causes include the following: °· Chronic bronchitis and emphysema (COPD).   °· Blood clot going to a lung (pulmonary embolism).   °· Having water in the lungs caused by heart failure, lung injury, or infection (pulmonary edema).   °· Collapsed lung (pneumothorax).   °· Pneumonia.   °· Pulmonary fibrosis.   °· Obesity.   °· Asthma.   °· Heart failure.   °· Any type of trauma to the chest that can make breathing difficult.   °· Nerve or muscle diseases making chest movements difficult. °WHAT SYMPTOMS SHOULD YOU WATCH FOR?  °If you have any of these signs or symptoms, you should seek immediate medical care:  °· You have shortness of breath (dyspnea) with or without activity.   °· You have rapid, fast breathing (tachypnea).   °· You are wheezing. °· You are unable to say more than a few words without having to catch your breath. °· You find it very difficult to function normally. °· You have a fast heart rate.   °· You have a bluish color to your finger or toe nail beds.   °· You have confusion or drowsiness or both.   °HOW WILL MY ACUTE RESPIRATORY FAILURE BE TREATED?  °Treatment of acute respiratory failure depends on the cause of the respiratory failure. Usually, you will stay in the intensive care unit so your breathing can be watched closely. Treatment  can include the following: °· Oxygen. Oxygen can be delivered through the following: °· Nasal cannula. This is small tubing that goes in your nose to give you oxygen. °· Face mask. A face mask covers your nose and mouth to give you oxygen. °· Medicine. Different medicines can be given to help with breathing. These can include: °· Nebulizers. Nebulizers deliver medicines to open the air passages (bronchodilators). These medicines help to open or relax the airways in the lungs so you can breathe better. They can also help loosen mucus from your lungs. °· Diuretics. Diuretic medicines can help you breathe better by getting rid of extra water in your body. °· Steroids. Steroid medicines can help decrease swelling (inflammation) in your lungs. °· Antibiotics. °· Chest tube. If you have a collapsed lung (pneumothorax), a chest tube is placed to help reinflate the lung. °· Non-invasive positive pressure ventilation (NPPV). This is a tight-fitting mask that goes over your nose and mouth. The mask has tubing that is attached to a machine. The machine blows air into the tubing, which helps to keep the tiny air sacs (alveoli) in your lungs open. This machine allows you to breathe on your own. °· Ventilator. A ventilator is a breathing machine. When on a ventilator, a breathing tube is put into the lungs. A ventilator is used when you can no longer breathe well enough on your own. You may have low oxygen levels or high carbon dioxide (CO2) levels in your blood. When you are on a ventilator, sedation and pain medicines are given to make you sleep   so your lungs can heal. °Document Released: 06/29/2013 Document Revised: 11/08/2013 Document Reviewed: 06/29/2013 °[employer]® Patient Information ©2015 [employer]. This information is not intended to replace advice given to you by your health care provider. Make sure you discuss any questions you have with your health care provider. ° °Heart Failure °Heart failure means your heart has  trouble pumping blood. This makes it hard for your body to work well. Heart failure is usually a long-term (chronic) condition. You must take good care of yourself and follow your doctor's treatment plan. °HOME CARE °· Take your heart medicine as told by your doctor. °¨ Do not stop taking medicine unless your doctor tells you to. °¨ Do not skip any dose of medicine. °¨ Refill your medicines before they run out. °¨ Take other medicines only as told by your doctor or pharmacist. °· Stay active if told by your doctor. The elderly and people with severe heart failure should talk with a doctor about physical activity. °· Eat heart-healthy foods. Choose foods that are without trans fat and are low in saturated fat, cholesterol, and salt (sodium). This includes fresh or frozen fruits and vegetables, fish, lean meats, fat-free or low-fat dairy foods, whole grains, and high-fiber foods. Lentils and dried peas and beans (legumes) are also good choices. °· Limit salt if told by your doctor. °· Cook in a healthy way. Roast, grill, broil, bake, poach, steam, or stir-fry foods. °· Limit fluids as told by your doctor. °· Weigh yourself every morning. Do this after you pee (urinate) and before you eat breakfast. Write down your weight to give to your doctor. °· Take your blood pressure and write it down if your doctor tells you to. °· Ask your doctor how to check your pulse. Check your pulse as told. °· Lose weight if told by your doctor. °· Stop smoking or chewing tobacco. Do not use gum or patches that help you quit without your doctor's approval. °· Schedule and go to doctor visits as told. °· Nonpregnant women should have no more than 1 drink a day. Men should have no more than 2 drinks a day. Talk to your doctor about drinking alcohol. °· Stop illegal drug use. °· Stay current with shots (immunizations). °· Manage your health conditions as told by your doctor. °· Learn to manage your stress. °· Rest when you are tired. °· If  it is really hot outside: °¨ Avoid intense activities. °¨ Use air conditioning or fans, or get in a cooler place. °¨ Avoid caffeine and alcohol. °¨ Wear loose-fitting, lightweight, and light-colored clothing. °· If it is really cold outside: °¨ Avoid intense activities. °¨ Layer your clothing. °¨ Wear mittens or gloves, a hat, and a scarf when going outside. °¨ Avoid alcohol. °· Learn about heart failure and get support as needed. °· Get help to maintain or improve your quality of life and your ability to care for yourself as needed. °GET HELP IF:  °· You gain 03 lb/1.4 kg or more in 1 day or 05 lb/2.3 kg in a week. °· You are more short of breath than usual. °· You cannot do your normal activities. °· You tire easily. °· You cough more than normal, especially with activity. °· You have any or more puffiness (swelling) in areas such as your hands, feet, ankles, or belly (abdomen). °· You cannot sleep because it is hard to breathe. °· You feel like your heart is beating fast (palpitations). °· You get dizzy or light-headed when   you stand up. °GET HELP RIGHT AWAY IF:  °· You have trouble breathing. °· There is a change in mental status, such as becoming less alert or not being able to focus. °· You have chest pain or discomfort. °· You faint. °MAKE SURE YOU:  °· Understand these instructions. °· Will watch your condition. °· Will get help right away if you are not doing well or get worse. °Document Released: 04/02/2008 Document Revised: 11/08/2013 Document Reviewed: 08/10/2012 °ExitCare® Patient Information ©2015 [employer]. This information is not intended to replace advice given to you by your health care provider. Make sure you discuss any questions you have with your health care provider. ° °

## 2015-03-13 ENCOUNTER — Other Ambulatory Visit
Admission: RE | Admit: 2015-03-13 | Discharge: 2015-03-13 | Disposition: A | Discharge status: Home / Self Care | Payer: Medicare (Managed Care) | Source: Other Acute Inpatient Hospital | Attending: Family Medicine | Admitting: Family Medicine

## 2015-03-13 DIAGNOSIS — I10 Essential (primary) hypertension: Secondary | ICD-10-CM | POA: Diagnosis present

## 2015-03-13 LAB — BASIC METABOLIC PANEL
ANION GAP: 9 (ref 5–15)
BUN: 71 mg/dL — AB (ref 6–20)
CALCIUM: 8.6 mg/dL — AB (ref 8.9–10.3)
CO2: 30 mmol/L (ref 22–32)
Chloride: 102 mmol/L (ref 101–111)
Creatinine, Ser: 4.37 mg/dL — ABNORMAL HIGH (ref 0.61–1.24)
GFR calc Af Amer: 14 mL/min — ABNORMAL LOW (ref >60)
GFR, EST NON AFRICAN AMERICAN: 12 mL/min — AB (ref >60)
Glucose, Bld: 124 mg/dL — ABNORMAL HIGH (ref 65–99)
POTASSIUM: 5 mmol/L (ref 3.5–5.1)
SODIUM: 141 mmol/L (ref 135–145)

## 2015-03-29 ENCOUNTER — Other Ambulatory Visit: Payer: Self-pay | Admitting: Vascular Surgery

## 2015-03-30 ENCOUNTER — Other Ambulatory Visit: Payer: Medicare (Managed Care)

## 2015-03-30 ENCOUNTER — Encounter
Admission: RE | Admit: 2015-03-30 | Discharge: 2015-03-30 | Disposition: A | Discharge status: Home / Self Care | Payer: Medicare (Managed Care) | Source: Ambulatory Visit | Attending: Vascular Surgery | Admitting: Vascular Surgery

## 2015-03-30 DIAGNOSIS — Z01812 Encounter for preprocedural laboratory examination: Secondary | ICD-10-CM | POA: Diagnosis present

## 2015-03-30 HISTORY — DX: Unspecified atrial fibrillation: I48.91

## 2015-03-30 HISTORY — DX: Atherosclerotic heart disease of native coronary artery without angina pectoris: I25.10

## 2015-03-30 HISTORY — DX: Cerebral infarction, unspecified: I63.9

## 2015-03-30 HISTORY — DX: Cardiac arrhythmia, unspecified: I49.9

## 2015-03-30 HISTORY — DX: Blindness, one eye, unspecified eye: H54.40

## 2015-03-30 HISTORY — DX: Anemia, unspecified: D64.9

## 2015-03-30 HISTORY — DX: Peripheral vascular disease, unspecified: I73.9

## 2015-03-30 LAB — BASIC METABOLIC PANEL
Anion gap: 9 (ref 5–15)
BUN: 67 mg/dL — AB (ref 6–20)
CHLORIDE: 106 mmol/L (ref 101–111)
CO2: 26 mmol/L (ref 22–32)
CREATININE: 3.56 mg/dL — AB (ref 0.61–1.24)
Calcium: 8.6 mg/dL — ABNORMAL LOW (ref 8.9–10.3)
GFR calc Af Amer: 17 mL/min — ABNORMAL LOW (ref >60)
GFR calc non Af Amer: 15 mL/min — ABNORMAL LOW (ref >60)
Glucose, Bld: 104 mg/dL — ABNORMAL HIGH (ref 65–99)
POTASSIUM: 5.2 mmol/L — AB (ref 3.5–5.1)
Sodium: 141 mmol/L (ref 135–145)

## 2015-03-30 LAB — TYPE AND SCREEN
ABO/RH(D): A POS
ANTIBODY SCREEN: NEGATIVE

## 2015-03-30 LAB — CBC WITH DIFFERENTIAL/PLATELET
Basophils Absolute: 0.1 10*3/uL (ref 0–0.1)
Basophils Relative: 2 %
EOS ABS: 0.4 10*3/uL (ref 0–0.7)
EOS PCT: 7 %
HCT: 34.8 % — ABNORMAL LOW (ref 40.0–52.0)
HEMOGLOBIN: 10.8 g/dL — AB (ref 13.0–18.0)
LYMPHS ABS: 1 10*3/uL (ref 1.0–3.6)
LYMPHS PCT: 17 %
MCH: 27.9 pg (ref 26.0–34.0)
MCHC: 31.1 g/dL — AB (ref 32.0–36.0)
MCV: 89.6 fL (ref 80.0–100.0)
MONOS PCT: 10 %
Monocytes Absolute: 0.6 10*3/uL (ref 0.2–1.0)
NEUTROS PCT: 66 %
Neutro Abs: 4 10*3/uL (ref 1.4–6.5)
Platelets: 168 10*3/uL (ref 150–440)
RBC: 3.88 MIL/uL — AB (ref 4.40–5.90)
RDW: 17.1 % — ABNORMAL HIGH (ref 11.5–14.5)
WBC: 6 10*3/uL (ref 3.8–10.6)

## 2015-03-30 LAB — SURGICAL PCR SCREEN
MRSA, PCR: NEGATIVE
STAPHYLOCOCCUS AUREUS: POSITIVE — AB

## 2015-03-30 LAB — PROTIME-INR
INR: 1.1
PROTHROMBIN TIME: 14.4 s (ref 11.4–15.0)

## 2015-03-30 LAB — ABO/RH: ABO/RH(D): A POS

## 2015-03-30 LAB — APTT: aPTT: 29 seconds (ref 24–36)

## 2015-03-30 NOTE — Patient Instructions (Addendum)
  Your procedure is scheduled on: April 06, 2015 (Thursday) Report to Day Surgery.[facility]) Second Floor  To find out your arrival time please call [phone] between 1PM - 3PM on April 05, 2015 (Wednesday).  Remember: Instructions that are not followed completely may result in serious medical risk, up to and including death, or upon the discretion of your surgeon and anesthesiologist your surgery may need to be rescheduled.    __x__ 1. Do not eat food or drink liquids after midnight. No gum chewing or hard candies.     ____ 2. No Alcohol for 24 hours before or after surgery.   ____ 3. Bring all medications with you on the day of surgery if instructed.    __x__ 4. Notify your doctor if there is any change in your medical condition     (cold, fever, infections).     Do not wear jewelry, make-up, hairpins, clips or nail polish.  Do not wear lotions, powders, or perfumes. You may wear deodorant.  Do not shave 48 hours prior to surgery. Men may shave face and neck.  Do not bring valuables to the hospital.    [facility] is not responsible for any belongings or valuables.               Contacts, dentures or bridgework may not be worn into surgery.  Leave your suitcase in the car. After surgery it may be brought to your room.  For patients admitted to the hospital, discharge time is determined by your                treatment team.   Patients discharged the day of surgery will not be allowed to drive home.   Please read over the following fact sheets that you were given:   MRSA Information and Surgical Site Infection Prevention   __x__ Take these medicines the morning of surgery with A SIP OF WATER:    1. Caduet  2. Imdur  3. Toprol-XL  4.HydrAlazine  5.  6.  ____ Fleet Enema (as directed)   _x___ Use CHG Soap as directed  ____ Use inhalers on the day of surgery  ____ Stop metformin 2 days prior to surgery    ____ Take 1/2 of usual insulin dose the night  before surgery and none on the morning of surgery.   __x__ Stop Coumadin/Plavix/aspirin on (Do not take Aspirin the day of surgery) (Stop Plavix five (5) days before surgery  ____ Stop Anti-inflammatories on    ____ Stop supplements until after surgery.    ____ Bring C-Pap to the hospital.

## 2015-04-06 ENCOUNTER — Encounter: Payer: Self-pay | Admitting: *Deleted

## 2015-04-06 ENCOUNTER — Ambulatory Visit
Admission: RE | Admit: 2015-04-06 | Discharge: 2015-04-06 | Disposition: A | Discharge status: Skilled Nursing Facility | Payer: Medicare (Managed Care) | Source: Ambulatory Visit | Attending: Vascular Surgery | Admitting: Vascular Surgery

## 2015-04-06 ENCOUNTER — Ambulatory Visit: Payer: Medicare (Managed Care) | Admitting: Anesthesiology

## 2015-04-06 ENCOUNTER — Encounter
Admission: RE | Disposition: A | Discharge status: Skilled Nursing Facility | Payer: Self-pay | Source: Ambulatory Visit | Attending: Vascular Surgery

## 2015-04-06 DIAGNOSIS — E780 Pure hypercholesterolemia: Secondary | ICD-10-CM | POA: Insufficient documentation

## 2015-04-06 DIAGNOSIS — J9601 Acute respiratory failure with hypoxia: Secondary | ICD-10-CM | POA: Diagnosis not present

## 2015-04-06 DIAGNOSIS — N184 Chronic kidney disease, stage 4 (severe): Secondary | ICD-10-CM | POA: Insufficient documentation

## 2015-04-06 DIAGNOSIS — I129 Hypertensive chronic kidney disease with stage 1 through stage 4 chronic kidney disease, or unspecified chronic kidney disease: Secondary | ICD-10-CM | POA: Insufficient documentation

## 2015-04-06 DIAGNOSIS — Z7902 Long term (current) use of antithrombotics/antiplatelets: Secondary | ICD-10-CM

## 2015-04-06 DIAGNOSIS — I739 Peripheral vascular disease, unspecified: Secondary | ICD-10-CM | POA: Insufficient documentation

## 2015-04-06 DIAGNOSIS — I6529 Occlusion and stenosis of unspecified carotid artery: Secondary | ICD-10-CM | POA: Insufficient documentation

## 2015-04-06 DIAGNOSIS — I2510 Atherosclerotic heart disease of native coronary artery without angina pectoris: Secondary | ICD-10-CM | POA: Insufficient documentation

## 2015-04-06 DIAGNOSIS — Z87891 Personal history of nicotine dependence: Secondary | ICD-10-CM | POA: Insufficient documentation

## 2015-04-06 DIAGNOSIS — Z79899 Other long term (current) drug therapy: Secondary | ICD-10-CM | POA: Insufficient documentation

## 2015-04-06 DIAGNOSIS — I5033 Acute on chronic diastolic (congestive) heart failure: Secondary | ICD-10-CM | POA: Diagnosis not present

## 2015-04-06 HISTORY — PX: AV FISTULA PLACEMENT: SHX1204

## 2015-04-06 SURGERY — ARTERIOVENOUS (AV) FISTULA CREATION
Anesthesia: General | Laterality: Left | Wound class: Clean

## 2015-04-06 MED ORDER — FAMOTIDINE 20 MG PO TABS
20.0000 mg | ORAL_TABLET | Freq: Once | ORAL | Status: AC
  Administered 2015-04-06: 20 mg via ORAL

## 2015-04-06 MED ORDER — FLUMAZENIL 0.5 MG/5ML IV SOLN
0.2000 mg | Freq: Once | INTRAVENOUS | Status: AC
  Administered 2015-04-06: 0.2 mg via INTRAVENOUS

## 2015-04-06 MED ORDER — FENTANYL CITRATE (PF) 100 MCG/2ML IJ SOLN: 25.0000 ug | INTRAMUSCULAR | Status: DC | PRN

## 2015-04-06 MED ORDER — FLUMAZENIL 0.5 MG/5ML IV SOLN
INTRAVENOUS | Status: AC
  Filled 2015-04-06: qty 5

## 2015-04-06 MED ORDER — FUROSEMIDE 10 MG/ML IJ SOLN
INTRAMUSCULAR | Status: AC
  Filled 2015-04-06: qty 2

## 2015-04-06 MED ORDER — HYDROCODONE-ACETAMINOPHEN 5-325 MG PO TABS: 1.0000 | ORAL_TABLET | Freq: Four times a day (QID) | ORAL | Status: DC | PRN

## 2015-04-06 MED ORDER — LIDOCAINE HCL (CARDIAC) 20 MG/ML IV SOLN
INTRAVENOUS | Status: DC | PRN
  Administered 2015-04-06: 40 mg via INTRAVENOUS

## 2015-04-06 MED ORDER — HEPARIN SODIUM (PORCINE) 1000 UNIT/ML IJ SOLN
INTRAMUSCULAR | Status: DC | PRN
  Administered 2015-04-06: 10 mL via INTRAMUSCULAR

## 2015-04-06 MED ORDER — PROPOFOL 10 MG/ML IV BOLUS
INTRAVENOUS | Status: DC | PRN
  Administered 2015-04-06: 40 mg via INTRAVENOUS
  Administered 2015-04-06: 120 mg via INTRAVENOUS
  Administered 2015-04-06: 20 mg via INTRAVENOUS

## 2015-04-06 MED ORDER — ONDANSETRON HCL 4 MG/2ML IJ SOLN
4.0000 mg | Freq: Once | INTRAMUSCULAR | Status: DC | PRN
Start: 2015-04-06 — End: 2015-04-06

## 2015-04-06 MED ORDER — KETOROLAC TROMETHAMINE 30 MG/ML IJ SOLN: INTRAMUSCULAR | Status: DC | PRN

## 2015-04-06 MED ORDER — FENTANYL CITRATE (PF) 100 MCG/2ML IJ SOLN
INTRAMUSCULAR | Status: DC | PRN
  Administered 2015-04-06: 50 ug via INTRAVENOUS
  Administered 2015-04-06 (×2): 25 ug via INTRAVENOUS

## 2015-04-06 MED ORDER — HEPARIN SODIUM (PORCINE) 5000 UNIT/ML IJ SOLN
INTRAMUSCULAR | Status: AC
  Filled 2015-04-06: qty 1

## 2015-04-06 MED ORDER — BUPIVACAINE-EPINEPHRINE (PF) 0.5% -1:200000 IJ SOLN
INTRAMUSCULAR | Status: AC
  Filled 2015-04-06: qty 30

## 2015-04-06 MED ORDER — VANCOMYCIN HCL IN DEXTROSE 1-5 GM/200ML-% IV SOLN
1000.0000 mg | Freq: Once | INTRAVENOUS | Status: AC
  Administered 2015-04-06: 1 g via INTRAVENOUS

## 2015-04-06 MED ORDER — PAPAVERINE HCL 30 MG/ML IJ SOLN
INTRAMUSCULAR | Status: AC
  Filled 2015-04-06: qty 2

## 2015-04-06 MED ORDER — PAPAVERINE HCL 30 MG/ML IJ SOLN
INTRAMUSCULAR | Status: DC | PRN
  Administered 2015-04-06: 30 mg via INTRAVENOUS

## 2015-04-06 MED ORDER — FUROSEMIDE 10 MG/ML IJ SOLN
20.0000 mg | Freq: Once | INTRAMUSCULAR | Status: AC
  Administered 2015-04-06: 20 mg via INTRAVENOUS

## 2015-04-06 MED ORDER — VANCOMYCIN HCL IN DEXTROSE 1-5 GM/200ML-% IV SOLN
INTRAVENOUS | Status: AC
Start: 2015-04-06 — End: 2015-04-06
  Administered 2015-04-06: 1 g via INTRAVENOUS
  Filled 2015-04-06: qty 200

## 2015-04-06 MED ORDER — SODIUM CHLORIDE 0.9 % IV SOLN
INTRAVENOUS | Status: DC
  Administered 2015-04-06: 12:00:00 via INTRAVENOUS

## 2015-04-06 MED ORDER — IPRATROPIUM-ALBUTEROL 0.5-2.5 (3) MG/3ML IN SOLN
3.0000 mL | Freq: Once | RESPIRATORY_TRACT | Status: AC
  Administered 2015-04-06: 3 mL via RESPIRATORY_TRACT

## 2015-04-06 MED ORDER — METOPROLOL TARTRATE 1 MG/ML IV SOLN
INTRAVENOUS | Status: DC | PRN
  Administered 2015-04-06 (×2): 1 mg via INTRAVENOUS

## 2015-04-06 MED ORDER — MIDAZOLAM HCL 2 MG/2ML IJ SOLN
INTRAMUSCULAR | Status: DC | PRN
  Administered 2015-04-06: .5 mg via INTRAVENOUS
  Administered 2015-04-06: 0.5 mg via INTRAVENOUS

## 2015-04-06 MED ORDER — SODIUM CHLORIDE 0.9 % IV SOLN
10.0000 mg | INTRAVENOUS | Status: DC | PRN
  Administered 2015-04-06: 50 ug/min via INTRAVENOUS

## 2015-04-06 MED ORDER — PHENYLEPHRINE HCL 10 MG/ML IJ SOLN
INTRAMUSCULAR | Status: DC | PRN
  Administered 2015-04-06: 150 ug via INTRAVENOUS
  Administered 2015-04-06: 100 ug via INTRAVENOUS

## 2015-04-06 MED ORDER — FAMOTIDINE 20 MG PO TABS
ORAL_TABLET | ORAL | Status: AC
Start: 2015-04-06 — End: 2015-04-06
  Administered 2015-04-06: 20 mg via ORAL
  Filled 2015-04-06: qty 1

## 2015-04-06 SURGICAL SUPPLY — 47 items
BAG DECANTER FOR FLEXI CONT (MISCELLANEOUS) ×3 IMPLANT
BLADE SURG SZ11 CARB STEEL (BLADE) ×3 IMPLANT
BOOT SUTURE AID YELLOW STND (SUTURE) ×3 IMPLANT
BRUSH SCRUB 4% CHG (MISCELLANEOUS) ×3 IMPLANT
CANISTER SUCT 1200ML W/VALVE (MISCELLANEOUS) ×3 IMPLANT
CHLORAPREP W/TINT 26ML (MISCELLANEOUS) ×6 IMPLANT
CLIP SPRNG 6MM S-JAW DBL (CLIP) ×3
ELECT CAUTERY BLADE 6.4 (BLADE) ×3 IMPLANT
GEL ULTRASOUND 20GR AQUASONIC (MISCELLANEOUS) IMPLANT
GLOVE BIO SURGEON STRL SZ7 (GLOVE) ×18 IMPLANT
GOWN STRL REUS W/ TWL LRG LVL3 (GOWN DISPOSABLE) ×2 IMPLANT
GOWN STRL REUS W/ TWL XL LVL3 (GOWN DISPOSABLE) ×1 IMPLANT
GOWN STRL REUS W/TWL LRG LVL3 (GOWN DISPOSABLE) ×4
GOWN STRL REUS W/TWL XL LVL3 (GOWN DISPOSABLE) ×2
HEMOSTAT SURGICEL 2X3 (HEMOSTASIS) ×3 IMPLANT
IV NS 500ML (IV SOLUTION) ×2
IV NS 500ML BAXH (IV SOLUTION) ×1 IMPLANT
KIT RM TURNOVER STRD PROC AR (KITS) ×3 IMPLANT
LABEL OR SOLS (LABEL) ×3 IMPLANT
LIQUID BAND (GAUZE/BANDAGES/DRESSINGS) ×3 IMPLANT
LOOP RED MAXI  1X406MM (MISCELLANEOUS) ×2
LOOP VESSEL MAXI 1X406 RED (MISCELLANEOUS) ×1 IMPLANT
LOOP VESSEL MINI 0.8X406 BLUE (MISCELLANEOUS) ×1 IMPLANT
LOOPS BLUE MINI 0.8X406MM (MISCELLANEOUS) ×2
NEEDLE FILTER BLUNT 18X 1/2SAF (NEEDLE) ×2
NEEDLE FILTER BLUNT 18X1 1/2 (NEEDLE) ×1 IMPLANT
NEEDLE HYPO 30X.5 LL (NEEDLE) ×3 IMPLANT
NS IRRIG 500ML POUR BTL (IV SOLUTION) ×3 IMPLANT
PACK EXTREMITY ARMC (MISCELLANEOUS) ×3 IMPLANT
PAD GROUND ADULT SPLIT (MISCELLANEOUS) ×3 IMPLANT
PAD PREP 24X41 OB/GYN DISP (PERSONAL CARE ITEMS) ×3 IMPLANT
SOLUTION CELL SAVER (CLIP) ×1 IMPLANT
STOCKINETTE STRL 4IN 9604848 (GAUZE/BANDAGES/DRESSINGS) ×3 IMPLANT
SUT MNCRL AB 4-0 PS2 18 (SUTURE) ×3 IMPLANT
SUT PROLENE 6 0 BV (SUTURE) ×6 IMPLANT
SUT SILK 2 0 (SUTURE) ×2
SUT SILK 2-0 18XBRD TIE 12 (SUTURE) ×1 IMPLANT
SUT SILK 3 0 (SUTURE) ×2
SUT SILK 3-0 18XBRD TIE 12 (SUTURE) ×1 IMPLANT
SUT SILK 4 0 (SUTURE) ×2
SUT SILK 4-0 18XBRD TIE 12 (SUTURE) ×1 IMPLANT
SUT VIC AB 3-0 SH 27 (SUTURE) ×2
SUT VIC AB 3-0 SH 27X BRD (SUTURE) ×1 IMPLANT
SYR 20CC LL (SYRINGE) ×3 IMPLANT
SYR 3ML LL SCALE MARK (SYRINGE) ×6 IMPLANT
SYR TB 1ML 27GX1/2 LL (SYRINGE) ×6 IMPLANT
TOWEL OR 17X26 4PK STRL BLUE (TOWEL DISPOSABLE) IMPLANT

## 2015-04-06 NOTE — Progress Notes (Signed)
Dr Noralyn Pick in to see pt  States he can go to same day area

## 2015-04-06 NOTE — Transfer of Care (Signed)
Immediate Anesthesia Transfer of Care Note  Patient: Sean Delgado.  Procedure(s) Performed: Procedure(s): ARTERIOVENOUS (AV) FISTULA CREATION (Left)  Patient Location: PACU  Anesthesia Type:General  Level of Consciousness: sedated  Airway & Oxygen Therapy: Patient Spontanous Breathing and Patient connected to face mask oxygen  Post-op Assessment: Report given to RN and Post -op Vital signs reviewed and stable  Post vital signs: Reviewed and stable  Last Vitals:  Filed Vitals:   04/06/15 1107  BP: 143/78  Pulse: 107  Temp: 37.2 C  Resp: 16    Complications: No apparent anesthesia complications

## 2015-04-06 NOTE — H&P (Signed)
  [facility] History & Physical Update  The patient was interviewed and re-examined.  The patient's previous History and Physical has been reviewed and is unchanged.  There is no change in the plan of care. We plan to proceed with the scheduled procedure.  DEW,JASON, MD  04/06/2015, 12:38 PM

## 2015-04-06 NOTE — Anesthesia Procedure Notes (Signed)
Procedure Name: LMA Insertion Date/Time: 04/06/2015 1:15 PM Performed by: Henrietta Hoover Pre-anesthesia Checklist: Patient identified, Emergency Drugs available, Suction available, Patient being monitored and Timeout performed Patient Re-evaluated:Patient Re-evaluated prior to inductionOxygen Delivery Method: Circle system utilized Preoxygenation: Pre-oxygenation with 100% oxygen Intubation Type: IV induction Ventilation: Mask ventilation without difficulty LMA: LMA inserted Grade View: Grade I Number of attempts: 3 Placement Confirmation: positive ETCO2 and breath sounds checked- equal and bilateral Tube secured with: Tape Dental Injury: Teeth and Oropharynx as per pre-operative assessment  Comments: Started with air q 3.5, air leaking around it.  Changed to 4.5 air q, same.  Changed to #5 nondisposable

## 2015-04-06 NOTE — Anesthesia Preprocedure Evaluation (Addendum)
Anesthesia Evaluation  Patient identified by MRN, date of birth, ID band Patient awake    Reviewed: Allergy & Precautions, H&P , NPO status , [name] Chart, lab work & pertinent test results, reviewed documented beta blocker date and time   Airway Mallampati: III  TM Distance: >3 FB Neck ROM: full    Dental no notable dental hx.    Pulmonary neg pulmonary ROS, former smoker,    Pulmonary exam normal breath sounds clear to auscultation       Cardiovascular Exercise Tolerance: Good hypertension, + CAD, + Peripheral Vascular Disease and +CHF  negative cardio ROS  + dysrhythmias Atrial Fibrillation  Rhythm:regular Rate:Normal     Neuro/Psych CVA, Residual Symptoms negative neurological ROS  negative psych ROS   GI/Hepatic negative GI ROS, Neg liver ROS,   Endo/Other  negative endocrine ROS  Renal/GU Renal diseasenegative Renal ROS  negative genitourinary   Musculoskeletal   Abdominal   Peds  Hematology negative hematology ROS (+) anemia ,   Anesthesia Other Findings   Reproductive/Obstetrics negative OB ROS                            Anesthesia Physical Anesthesia Plan  ASA: IV  Anesthesia Plan: General   Post-op Pain Management:    Induction:   Airway Management Planned:   Additional Equipment:   Intra-op Plan:   Post-operative Plan:   Informed Consent: I have reviewed the patients History and Physical, chart, labs and discussed the procedure including the risks, benefits and alternatives for the proposed anesthesia with the patient or authorized representative who has indicated his/her understanding and acceptance.   Dental Advisory Given  Plan Discussed with: CRNA  Anesthesia Plan Comments:         Anesthesia Quick Evaluation

## 2015-04-06 NOTE — Progress Notes (Signed)
Received flumazenil .  given for sleepyness   Dr Noralyn Pick in to see pt  Sat low still after treatement

## 2015-04-06 NOTE — Progress Notes (Signed)
Sat on room air 92 more alert

## 2015-04-06 NOTE — Op Note (Signed)
[facility]   OPERATIVE NOTE   PROCEDURE: Left radiocephaic arteriovenous fistula placement  PRE-OPERATIVE DIAGNOSIS: 1. Chronic kidney disease 2. Carotid artery stenosis 3. hypertension  POST-OPERATIVE DIAGNOSIS: Same  SURGEON: DEW,JASON, MD  ASSISTANT(S): Raul Del, PA-C  ANESTHESIA: general  ESTIMATED BLOOD LOSS: 50 cc  FINDING(S): Good radial artery, small but patent cephalic vein  SPECIMEN(S):  None  INDICATIONS:   Sean Delgado. is a 81 y.o. male who presents with chronic kidney disease nearing dialysis dependence.  The patient is scheduled for left radiocephalic arteriovenous fistula placement when non-invasive studies suggested adequate anatomy for fistula creation at this location.  The patient is aware the risks include but are not limited to: bleeding, infection, steal syndrome, nerve damage, ischemic monomelic neuropathy, failure to mature, and need for additional procedures.  The patient is aware of the risks of the procedure and elects to proceed forward.  DESCRIPTION: After full informed written consent was obtained from the patient, the patient was brought back to the operating room and placed supine upon the operating table.  Prior to induction, the patient received IV antibiotics.   After obtaining adequate anesthesia, the patient was then prepped and draped in the standard fashion for a left arm access procedure.  I turned my attention first to identifying the patient's distal cephalic vein and radial artery.  I made an incision at the level of the distal forearm and wrist and dissected through the subcutaneous tissue and fascia to gain exposure of the radial artery.  This was noted to be of good size and useable for fistula creation.  This was dissected out proximally and distally and controlled with vessel loops.  I then dissected out the cephalic vein.  This was noted to be patent, but smaller than expected from our vein mapping.  It  did appear to be reasonably adequate size for fistula creation. I then gave the patient 3000 units of intravenous heparin.  The distal segment of the vein was ligated with a  2-0 silk, and the vein was transected. I then instilled the heparinized saline into the vein and clamped it.  At this point, I reset my exposure of the radial artery and placed the artery under tension proximally and distally.  I made an arteriotomy with a #11 blade, and then I extended the arteriotomy with a Potts scissor.  I injected heparinized saline proximal and distal to this arteriotomy.  The vein was then sewn to the artery in an end-to-side configuration with a running stitch of 6-0 Prolene.  Prior to completing this anastomosis, I allowed the vein and artery to backbleed.  There was no evidence of clot from any vessels.  I completed the anastomosis in the usual fashion and then released all vessel loops and clamps. Two 6-0 Prolene patch sutures were used for hemostasis and topical Papavarine was used for venospasm. There was a palpable  thrill in the venous outflow, and there was a palpable radial pulse beyond the anastomosis.  At this point, I irrigated out the surgical wound. Surgicel was placed. There was no further active bleeding.  The subcutaneous tissue was reapproximated with a running stitch of 3-0 Vicryl.  The skin was then reapproximated with a running subcuticular stitch of 4-0 Monocryl.  The skin was then cleaned, dried, and reinforced with Dermabond.  The patient tolerated this procedure well and was taken to the recovery room in stable condition  COMPLICATIONS: None  CONDITION: Stable   DEW,JASON, MD 04/06/2015 2:36 PM

## 2015-04-07 ENCOUNTER — Encounter: Payer: Self-pay | Admitting: Vascular Surgery

## 2015-04-08 ENCOUNTER — Emergency Department: Payer: Medicare (Managed Care)

## 2015-04-08 ENCOUNTER — Inpatient Hospital Stay
Admission: EM | Admit: 2015-04-08 | Discharge: 2015-04-17 | DRG: 264 | Disposition: A | Discharge status: Skilled Nursing Facility | Payer: Medicare (Managed Care) | Attending: Internal Medicine | Admitting: Internal Medicine

## 2015-04-08 DIAGNOSIS — R29810 Facial weakness: Secondary | ICD-10-CM | POA: Diagnosis present

## 2015-04-08 DIAGNOSIS — E872 Acidosis: Secondary | ICD-10-CM | POA: Diagnosis present

## 2015-04-08 DIAGNOSIS — Z833 Family history of diabetes mellitus: Secondary | ICD-10-CM

## 2015-04-08 DIAGNOSIS — Z992 Dependence on renal dialysis: Secondary | ICD-10-CM

## 2015-04-08 DIAGNOSIS — D631 Anemia in chronic kidney disease: Secondary | ICD-10-CM | POA: Diagnosis present

## 2015-04-08 DIAGNOSIS — J441 Chronic obstructive pulmonary disease with (acute) exacerbation: Secondary | ICD-10-CM | POA: Diagnosis present

## 2015-04-08 DIAGNOSIS — M109 Gout, unspecified: Secondary | ICD-10-CM | POA: Diagnosis present

## 2015-04-08 DIAGNOSIS — I6523 Occlusion and stenosis of bilateral carotid arteries: Secondary | ICD-10-CM | POA: Diagnosis present

## 2015-04-08 DIAGNOSIS — Z8701 Personal history of pneumonia (recurrent): Secondary | ICD-10-CM

## 2015-04-08 DIAGNOSIS — I509 Heart failure, unspecified: Secondary | ICD-10-CM

## 2015-04-08 DIAGNOSIS — I739 Peripheral vascular disease, unspecified: Secondary | ICD-10-CM | POA: Diagnosis present

## 2015-04-08 DIAGNOSIS — T502X5A Adverse effect of carbonic-anhydrase inhibitors, benzothiadiazides and other diuretics, initial encounter: Secondary | ICD-10-CM | POA: Diagnosis present

## 2015-04-08 DIAGNOSIS — R1310 Dysphagia, unspecified: Secondary | ICD-10-CM | POA: Diagnosis present

## 2015-04-08 DIAGNOSIS — R0902 Hypoxemia: Secondary | ICD-10-CM

## 2015-04-08 DIAGNOSIS — J159 Unspecified bacterial pneumonia: Secondary | ICD-10-CM | POA: Diagnosis present

## 2015-04-08 DIAGNOSIS — I6529 Occlusion and stenosis of unspecified carotid artery: Secondary | ICD-10-CM | POA: Diagnosis present

## 2015-04-08 DIAGNOSIS — R000 Tachycardia, unspecified: Secondary | ICD-10-CM

## 2015-04-08 DIAGNOSIS — N2581 Secondary hyperparathyroidism of renal origin: Secondary | ICD-10-CM | POA: Diagnosis present

## 2015-04-08 DIAGNOSIS — J810 Acute pulmonary edema: Secondary | ICD-10-CM

## 2015-04-08 DIAGNOSIS — Z86718 Personal history of other venous thrombosis and embolism: Secondary | ICD-10-CM | POA: Diagnosis not present

## 2015-04-08 DIAGNOSIS — H409 Unspecified glaucoma: Secondary | ICD-10-CM | POA: Diagnosis present

## 2015-04-08 DIAGNOSIS — N186 End stage renal disease: Secondary | ICD-10-CM | POA: Diagnosis present

## 2015-04-08 DIAGNOSIS — Z8673 Personal history of transient ischemic attack (TIA), and cerebral infarction without residual deficits: Secondary | ICD-10-CM

## 2015-04-08 DIAGNOSIS — I5033 Acute on chronic diastolic (congestive) heart failure: Secondary | ICD-10-CM | POA: Diagnosis present

## 2015-04-08 DIAGNOSIS — J9602 Acute respiratory failure with hypercapnia: Secondary | ICD-10-CM | POA: Diagnosis present

## 2015-04-08 DIAGNOSIS — I4891 Unspecified atrial fibrillation: Secondary | ICD-10-CM | POA: Diagnosis present

## 2015-04-08 DIAGNOSIS — Z87891 Personal history of nicotine dependence: Secondary | ICD-10-CM

## 2015-04-08 DIAGNOSIS — R531 Weakness: Secondary | ICD-10-CM

## 2015-04-08 DIAGNOSIS — G629 Polyneuropathy, unspecified: Secondary | ICD-10-CM | POA: Diagnosis present

## 2015-04-08 DIAGNOSIS — I132 Hypertensive heart and chronic kidney disease with heart failure and with stage 5 chronic kidney disease, or end stage renal disease: Secondary | ICD-10-CM | POA: Diagnosis present

## 2015-04-08 DIAGNOSIS — Z9889 Other specified postprocedural states: Secondary | ICD-10-CM

## 2015-04-08 DIAGNOSIS — R0600 Dyspnea, unspecified: Secondary | ICD-10-CM

## 2015-04-08 DIAGNOSIS — G4730 Sleep apnea, unspecified: Secondary | ICD-10-CM | POA: Diagnosis present

## 2015-04-08 DIAGNOSIS — E870 Hyperosmolality and hypernatremia: Secondary | ICD-10-CM | POA: Diagnosis present

## 2015-04-08 DIAGNOSIS — Z9842 Cataract extraction status, left eye: Secondary | ICD-10-CM

## 2015-04-08 DIAGNOSIS — I2510 Atherosclerotic heart disease of native coronary artery without angina pectoris: Secondary | ICD-10-CM | POA: Diagnosis present

## 2015-04-08 DIAGNOSIS — E875 Hyperkalemia: Secondary | ICD-10-CM | POA: Diagnosis present

## 2015-04-08 DIAGNOSIS — J189 Pneumonia, unspecified organism: Secondary | ICD-10-CM

## 2015-04-08 DIAGNOSIS — H5441 Blindness, right eye, normal vision left eye: Secondary | ICD-10-CM | POA: Diagnosis present

## 2015-04-08 DIAGNOSIS — J9601 Acute respiratory failure with hypoxia: Secondary | ICD-10-CM | POA: Diagnosis present

## 2015-04-08 DIAGNOSIS — J9600 Acute respiratory failure, unspecified whether with hypoxia or hypercapnia: Secondary | ICD-10-CM | POA: Diagnosis present

## 2015-04-08 LAB — COMPREHENSIVE METABOLIC PANEL
ALK PHOS: 87 U/L (ref 38–126)
ALT: 24 U/L (ref 17–63)
ANION GAP: 11 (ref 5–15)
AST: 32 U/L (ref 15–41)
Albumin: 3 g/dL — ABNORMAL LOW (ref 3.5–5.0)
BILIRUBIN TOTAL: 0.7 mg/dL (ref 0.3–1.2)
BUN: 70 mg/dL — ABNORMAL HIGH (ref 6–20)
CALCIUM: 7.8 mg/dL — AB (ref 8.9–10.3)
CO2: 22 mmol/L (ref 22–32)
Chloride: 111 mmol/L (ref 101–111)
Creatinine, Ser: 4.08 mg/dL — ABNORMAL HIGH (ref 0.61–1.24)
GFR calc non Af Amer: 13 mL/min — ABNORMAL LOW (ref >60)
GFR, EST AFRICAN AMERICAN: 15 mL/min — AB (ref >60)
Glucose, Bld: 129 mg/dL — ABNORMAL HIGH (ref 65–99)
Potassium: 5.3 mmol/L — ABNORMAL HIGH (ref 3.5–5.1)
SODIUM: 144 mmol/L (ref 135–145)
TOTAL PROTEIN: 6.9 g/dL (ref 6.5–8.1)

## 2015-04-08 LAB — CBC WITH DIFFERENTIAL/PLATELET
Basophils Absolute: 0.1 10*3/uL (ref 0–0.1)
Basophils Relative: 1 %
EOS ABS: 0.3 10*3/uL (ref 0–0.7)
Eosinophils Relative: 4 %
HCT: 28.6 % — ABNORMAL LOW (ref 40.0–52.0)
HEMOGLOBIN: 9 g/dL — AB (ref 13.0–18.0)
LYMPHS ABS: 0.6 10*3/uL — AB (ref 1.0–3.6)
Lymphocytes Relative: 8 %
MCH: 28 pg (ref 26.0–34.0)
MCHC: 31.4 g/dL — AB (ref 32.0–36.0)
MCV: 89.2 fL (ref 80.0–100.0)
MONOS PCT: 10 %
Monocytes Absolute: 0.7 10*3/uL (ref 0.2–1.0)
NEUTROS PCT: 77 %
Neutro Abs: 6 10*3/uL (ref 1.4–6.5)
Platelets: 159 10*3/uL (ref 150–440)
RBC: 3.2 MIL/uL — ABNORMAL LOW (ref 4.40–5.90)
RDW: 17 % — ABNORMAL HIGH (ref 11.5–14.5)
WBC: 7.7 10*3/uL (ref 3.8–10.6)

## 2015-04-08 LAB — BRAIN NATRIURETIC PEPTIDE: B Natriuretic Peptide: 763 pg/mL — ABNORMAL HIGH (ref 0.0–100.0)

## 2015-04-08 LAB — TROPONIN I: TROPONIN I: 0.03 ng/mL (ref <0.031)

## 2015-04-08 MED ORDER — PREGABALIN 75 MG PO CAPS
75.0000 mg | ORAL_CAPSULE | Freq: Two times a day (BID) | ORAL | Status: DC
  Administered 2015-04-08 – 2015-04-16 (×17): 75 mg via ORAL
  Filled 2015-04-08 (×17): qty 1

## 2015-04-08 MED ORDER — SODIUM POLYSTYRENE SULFONATE 15 GM/60ML PO SUSP
15.0000 g | ORAL | Status: DC
  Administered 2015-04-10: 15 g via ORAL
  Filled 2015-04-08: qty 60

## 2015-04-08 MED ORDER — FUROSEMIDE 10 MG/ML IJ SOLN
80.0000 mg | Freq: Once | INTRAMUSCULAR | Status: AC
  Administered 2015-04-08: 80 mg via INTRAVENOUS
  Filled 2015-04-08: qty 8

## 2015-04-08 MED ORDER — SODIUM CHLORIDE 0.9 % IJ SOLN
3.0000 mL | Freq: Two times a day (BID) | INTRAMUSCULAR | Status: DC
  Administered 2015-04-08 – 2015-04-16 (×13): 3 mL via INTRAVENOUS

## 2015-04-08 MED ORDER — FERROUS SULFATE 325 (65 FE) MG PO TABS
325.0000 mg | ORAL_TABLET | Freq: Two times a day (BID) | ORAL | Status: DC
  Administered 2015-04-08 – 2015-04-16 (×16): 325 mg via ORAL
  Filled 2015-04-08 (×17): qty 1

## 2015-04-08 MED ORDER — SODIUM BICARBONATE 650 MG PO TABS
650.0000 mg | ORAL_TABLET | Freq: Two times a day (BID) | ORAL | Status: DC
Start: 2015-04-08 — End: 2015-04-17
  Administered 2015-04-08 – 2015-04-16 (×17): 650 mg via ORAL
  Filled 2015-04-08 (×17): qty 1

## 2015-04-08 MED ORDER — AMLODIPINE-ATORVASTATIN 10-20 MG PO TABS
1.0000 | ORAL_TABLET | Freq: Every day | ORAL | Status: DC
Start: 2015-04-08 — End: 2015-04-08

## 2015-04-08 MED ORDER — SENNOSIDES-DOCUSATE SODIUM 8.6-50 MG PO TABS
2.0000 | ORAL_TABLET | Freq: Every day | ORAL | Status: DC
Start: 2015-04-08 — End: 2015-04-17
  Administered 2015-04-08 – 2015-04-16 (×8): 2 via ORAL
  Filled 2015-04-08 (×9): qty 2

## 2015-04-08 MED ORDER — FUROSEMIDE 10 MG/ML IJ SOLN
60.0000 mg | Freq: Two times a day (BID) | INTRAMUSCULAR | Status: DC
  Administered 2015-04-09 – 2015-04-11 (×5): 60 mg via INTRAVENOUS
  Filled 2015-04-08 (×5): qty 6

## 2015-04-08 MED ORDER — ISOSORBIDE MONONITRATE ER 60 MG PO TB24
60.0000 mg | ORAL_TABLET | Freq: Every day | ORAL | Status: DC
  Administered 2015-04-09 – 2015-04-17 (×6): 60 mg via ORAL
  Filled 2015-04-08 (×6): qty 1

## 2015-04-08 MED ORDER — ACETAMINOPHEN 325 MG PO TABS: 650.0000 mg | ORAL_TABLET | Freq: Four times a day (QID) | ORAL | Status: DC | PRN

## 2015-04-08 MED ORDER — HYDRALAZINE HCL 25 MG PO TABS
75.0000 mg | ORAL_TABLET | Freq: Three times a day (TID) | ORAL | Status: DC
  Administered 2015-04-08 – 2015-04-10 (×6): 75 mg via ORAL
  Filled 2015-04-08 (×6): qty 3

## 2015-04-08 MED ORDER — ASPIRIN EC 81 MG PO TBEC
81.0000 mg | DELAYED_RELEASE_TABLET | Freq: Every day | ORAL | Status: DC
  Administered 2015-04-09 – 2015-04-17 (×9): 81 mg via ORAL
  Filled 2015-04-08 (×9): qty 1

## 2015-04-08 MED ORDER — CLOPIDOGREL BISULFATE 75 MG PO TABS
75.0000 mg | ORAL_TABLET | Freq: Every day | ORAL | Status: DC
Start: 2015-04-09 — End: 2015-04-14
  Administered 2015-04-09 – 2015-04-14 (×6): 75 mg via ORAL
  Filled 2015-04-08 (×6): qty 1

## 2015-04-08 MED ORDER — ALBUTEROL SULFATE (2.5 MG/3ML) 0.083% IN NEBU: 5.0000 mg | INHALATION_SOLUTION | Freq: Once | RESPIRATORY_TRACT | Status: DC

## 2015-04-08 MED ORDER — METOPROLOL SUCCINATE ER 100 MG PO TB24
200.0000 mg | ORAL_TABLET | Freq: Every day | ORAL | Status: DC
  Administered 2015-04-09 – 2015-04-12 (×4): 200 mg via ORAL
  Filled 2015-04-08 (×4): qty 2

## 2015-04-08 MED ORDER — BRIMONIDINE TARTRATE 0.2 % OP SOLN
1.0000 [drp] | Freq: Two times a day (BID) | OPHTHALMIC | Status: DC
  Administered 2015-04-08 – 2015-04-16 (×17): 1 [drp] via OPHTHALMIC
  Filled 2015-04-08: qty 5

## 2015-04-08 MED ORDER — TROLAMINE SALICYLATE 10 % EX CREA
1.0000 | TOPICAL_CREAM | Freq: Four times a day (QID) | CUTANEOUS | Status: DC
  Administered 2015-04-08 – 2015-04-16 (×28): 1 via TOPICAL
  Filled 2015-04-08: qty 85

## 2015-04-08 MED ORDER — ADULT MULTIVITAMIN W/MINERALS CH
ORAL_TABLET | Freq: Every day | ORAL | Status: DC
  Administered 2015-04-09 – 2015-04-17 (×8): 1 via ORAL
  Filled 2015-04-08 (×10): qty 1

## 2015-04-08 MED ORDER — LATANOPROST 0.005 % OP SOLN
1.0000 [drp] | Freq: Every day | OPHTHALMIC | Status: DC
  Administered 2015-04-08 – 2015-04-16 (×9): 1 [drp] via OPHTHALMIC
  Filled 2015-04-08: qty 2.5

## 2015-04-08 MED ORDER — AMLODIPINE BESYLATE 10 MG PO TABS
10.0000 mg | ORAL_TABLET | Freq: Every day | ORAL | Status: DC
  Administered 2015-04-09 – 2015-04-11 (×3): 10 mg via ORAL
  Filled 2015-04-08 (×3): qty 1

## 2015-04-08 MED ORDER — ATORVASTATIN CALCIUM 20 MG PO TABS
20.0000 mg | ORAL_TABLET | Freq: Every day | ORAL | Status: DC
  Administered 2015-04-09 – 2015-04-16 (×7): 20 mg via ORAL
  Filled 2015-04-08 (×7): qty 1

## 2015-04-08 MED ORDER — NITROGLYCERIN 2 % TD OINT
0.5000 [in_us] | TOPICAL_OINTMENT | Freq: Once | TRANSDERMAL | Status: AC
  Administered 2015-04-08: 0.5 [in_us] via TOPICAL
  Filled 2015-04-08: qty 1

## 2015-04-08 MED ORDER — HYDROCODONE-ACETAMINOPHEN 5-325 MG PO TABS: 1.0000 | ORAL_TABLET | Freq: Four times a day (QID) | ORAL | Status: DC | PRN

## 2015-04-08 MED ORDER — HEPARIN SODIUM (PORCINE) 5000 UNIT/ML IJ SOLN
5000.0000 [IU] | Freq: Three times a day (TID) | INTRAMUSCULAR | Status: DC
Start: 2015-04-08 — End: 2015-04-17
  Administered 2015-04-08 – 2015-04-16 (×23): 5000 [IU] via SUBCUTANEOUS
  Filled 2015-04-08 (×24): qty 1

## 2015-04-08 MED ORDER — SODIUM CHLORIDE 0.9 % IJ SOLN: 3.0000 mL | INTRAMUSCULAR | Status: DC | PRN

## 2015-04-08 MED ORDER — PIPERACILLIN-TAZOBACTAM 3.375 G IVPB
3.3750 g | Freq: Two times a day (BID) | INTRAVENOUS | Status: DC
  Administered 2015-04-08 – 2015-04-09 (×2): 3.375 g via INTRAVENOUS
  Filled 2015-04-08 (×4): qty 50

## 2015-04-08 MED ORDER — SODIUM CHLORIDE 0.9 % IV SOLN: 250.0000 mL | INTRAVENOUS | Status: DC | PRN

## 2015-04-08 MED ORDER — ALLOPURINOL 100 MG PO TABS
100.0000 mg | ORAL_TABLET | Freq: Every day | ORAL | Status: DC
  Administered 2015-04-09 – 2015-04-17 (×9): 100 mg via ORAL
  Filled 2015-04-08 (×9): qty 1

## 2015-04-08 MED ORDER — TOLNAFTATE 1 % EX POWD
1.0000 "application " | Freq: Every day | CUTANEOUS | Status: DC
  Filled 2015-04-08 (×3): qty 45

## 2015-04-08 NOTE — ED Notes (Signed)
Pt presents via EMS from [facility] c/o SOB. 02 saturation in 70-80s on RA. On 4L upon arrival.

## 2015-04-08 NOTE — ED Provider Notes (Signed)
Time Seen: Approximately ----------------------------------------- 3:54 PM on 04/08/2015 -----------------------------------------   I have reviewed the triage notes  Chief Complaint: Shortness of Breath   History of Present Illness: Sean Flight. is a 81 y.o. male who presents with shortness of breath with room air saturation at [facility] of 70 to 80s percent on room air. Patient is not on any supplemental oxygen. He's got a history of renal insufficiency but does not require dialysis at this time. Currently has a left radial AV fistula which is been surgically place but not accessed at this point. [name] currently here with his [facility] physician states they have been following his renal function and have not quite had to initiate dialysis up to this point. She states this is a very similar presentation that he's had in the past with his pulmonary edema with a gets confused and tachypnea. Patient does describe orthopnea. He has a history of significant peripheral edema is been not noted to have a lot of swelling in his legs over the recent past. He denies any chest pain.   Past Medical History  Diagnosis Date  . Chronic renal failure, stage 4 (severe) (HCC)   . Hypertension   . CHF (congestive heart failure) (HCC)   . Gout   . Glaucoma   . Neuropathy (HCC)   . Stroke (HCC)   . Peripheral vascular disease (HCC)   . Coronary artery disease   . A-fib (HCC)   . Dysrhythmia   . Blindness of right eye   . Anemia     iron deficiency    Patient Active Problem List   Diagnosis Date Noted  . CHF (congestive heart failure) 03/04/2015    Past Surgical History  Procedure Laterality Date  . Vascular surgery Bilateral     Carotid Endarterectomy  . Eye surgery Left     Cataract Extraction  . Av fistula placement Left 04/06/2015    Procedure: ARTERIOVENOUS (AV) FISTULA CREATION;  Surgeon: Annice Needy, MD;  Location: [facility] ORS;  Service: Vascular;  Laterality: Left;    Past  Surgical History  Procedure Laterality Date  . Vascular surgery Bilateral     Carotid Endarterectomy  . Eye surgery Left     Cataract Extraction  . Av fistula placement Left 04/06/2015    Procedure: ARTERIOVENOUS (AV) FISTULA CREATION;  Surgeon: Annice Needy, MD;  Location: [facility] ORS;  Service: Vascular;  Laterality: Left;    Current Outpatient Rx  Name  Route  Sig  Dispense  Refill  . acetaminophen (TYLENOL) 325 MG tablet   Oral   Take 650 mg by mouth every 6 (six) hours as needed. For pain.         Marland Kitchen allopurinol (ZYLOPRIM) 100 MG tablet   Oral   Take 100 mg by mouth daily.         Marland Kitchen amlodipine-atorvastatin (CADUET) 10-20 MG per tablet   Oral   Take 1 tablet by mouth daily.         Marland Kitchen aspirin EC 81 MG tablet   Oral   Take 81 mg by mouth daily.         . brimonidine (ALPHAGAN) 0.2 % ophthalmic solution   Left Eye   Place 1 drop into the left eye 2 (two) times daily.         . clopidogrel (PLAVIX) 75 MG tablet   Oral   Take 75 mg by mouth daily.         Marland Kitchen  ferrous sulfate 325 (65 FE) MG tablet   Oral   Take 325 mg by mouth 2 (two) times daily.         . furosemide (LASIX) 20 MG tablet      3 tablets twice a day starting 03/12/2015   180 tablet   0   . hydrALAZINE (APRESOLINE) 50 MG tablet   Oral   Take 75 mg by mouth 3 (three) times daily.         Marland Kitchen HYDROcodone-acetaminophen (NORCO/VICODIN) 5-325 MG tablet   Oral   Take 1-2 tablets by mouth every 6 (six) hours as needed for moderate pain.   30 tablet   0   . isosorbide mononitrate (IMDUR) 60 MG 24 hr tablet   Oral   Take 60 mg by mouth daily.         Marland Kitchen latanoprost (XALATAN) 0.005 % ophthalmic solution   Both Eyes   Place 1 drop into both eyes at bedtime.         . metoprolol (TOPROL-XL) 200 MG 24 hr tablet   Oral   Take 200 mg by mouth daily.         . Multiple Vitamins-Minerals (CENTRUM SILVER PO)   Oral   Take 1 tablet by mouth daily.         . pregabalin (LYRICA) 75 MG  capsule   Oral   Take 75 mg by mouth 2 (two) times daily.         Marland Kitchen senna-docusate (SENOKOT-S) 8.6-50 MG per tablet   Oral   Take 2 tablets by mouth at bedtime.         . Skin Protectants, Misc. (A+D FIRST AID) OINT   Apply externally   Apply 1 application topically daily. Apply to feet         . sodium bicarbonate 650 MG tablet   Oral   Take 650 mg by mouth 2 (two) times daily.         . sodium polystyrene (KAYEXALATE) 15 GM/60ML suspension   Oral   Take 60 mLs by mouth 2 (two) times a week. Take on Monday and Friday.         . tolnaftate (TINACTIN) 1 % spray   Topical   Apply 1 application topically daily. Spray on both feet         . Trolamine Salicylate 10 % LOTN   Apply externally   Apply 1 application topically 4 (four) times daily. May apply this medication up to 4 times a day.           Allergies:  Review of patient's allergies indicates no known allergies.  Family History: Family History  Problem Relation Age of Onset  . Diabetes Mother     Social History: Social History  Substance Use Topics  . Smoking status: Former Smoker -- 0.25 packs/day for 15 years    Types: Cigarettes    Quit date: 09/06/2006  . Smokeless tobacco: Never Used  . Alcohol Use: No     Review of Systems:   10 point review of systems was performed and was otherwise negative:  Constitutional: No fever Eyes: No visual disturbances ENT: No sore throat, ear pain Cardiac: Patient denies any chest pain, arm pain, jaw pain. Respiratory: No shortness of breath, wheezing, or stridor Abdomen: No abdominal pain, no vomiting, No diarrhea Endocrine: No weight loss, No night sweats Extremities: Patient has chronic bilateral lower extremity edema but no increase recently., No cyanosis Skin: No rashes, easy  bruising Neurologic: No focal weakness, trouble with speech or swollowing Urologic: No dysuria, Hematuria, or urinary frequency. Patient states he does still  urinate.   Physical Exam:  ED Triage Vitals  Enc Vitals Group     BP 04/08/15 1540 128/63 mmHg     Pulse Rate 04/08/15 1540 107     Resp 04/08/15 1540 24     Temp 04/08/15 1540 98.8 F (37.1 C)     Temp Source 04/08/15 1540 Oral     SpO2 04/08/15 1540 95 %     Weight 04/08/15 1540 257 lb (116.574 kg)     Height 04/08/15 1540 6\' 1"  (1.854 m)     Head Cir --      Peak Flow --      Pain Score 04/08/15 1545 0     Pain Loc --      Pain Edu? --      Excl. in GC? --     General: Awake , Alert , and Oriented times 3; GCS 15. Tachypnea and speaks in interrupted sentences in respiratory distress with supplemental oxygen. No upper respiratory retractions Head: Normal cephalic , atraumatic Eyes: Pupils equal , round, reactive to light Nose/Throat: No nasal drainage, patent upper airway without erythema or exudate.  Neck: Supple, Full range of motion, No anterior adenopathy or palpable thyroid masses Lungs: Rales auscultated bilaterally halfway up posteriorly with no wheezes or rhonchi. Heart: Regular rate, regular rhythm without murmurs , gallops , or rubs Abdomen: Obese Soft, non tender without rebound, guarding , or rigidity; bowel sounds positive and symmetric in all 4 quadrants. No organomegaly .        Extremities: 2 plus symmetric pulses. Mild bilateral circumferential edema, no clubbing or cyanosis Neurologic: normal ambulation, Motor symmetric without deficits, sensory intact Skin: warm, dry, no rashes   Labs:   All laboratory work was reviewed including any pertinent negatives or positives listed below:  Labs Reviewed  BRAIN NATRIURETIC PEPTIDE  CBC WITH DIFFERENTIAL/PLATELET  COMPREHENSIVE METABOLIC PANEL    EKG:  ED ECG REPORT I, Jennye Moccasin, the attending physician, personally viewed and interpreted this ECG.  Date: 04/08/2015 EKG Time: 1545 Rate: Sinus tachycardia Rhythm: normal sinus rhythm QRS Axis: normal Intervals: normal ST/T Wave abnormalities:  normal Conduction Disutrbances: none Narrative Interpretation: Resolution of occasional PVCs seen on last EKG Comparison EKGs to 03-04-2015   Radiology: EXAM: PORTABLE CHEST 1 VIEW  COMPARISON: March 06, 2015  FINDINGS: There is focal airspace opacity in the right upper lobe, concerning for pneumonia. There is generalized interstitial prominence with cardiomegaly suggesting a degree of underlying chronic congestive heart failure. No adenopathy. The pulmonary vascularity is within normal limits.  IMPRESSION: Right upper lobe pneumonia. Suspect a degree of underlying chronic congestive heart failure.     I personally reviewed the radiologic studies     ED Course: I reviewed the radiologic studies and I felt that the shadowing was most likely more pulmonary edema than community-acquired pneumonia. The patient's afebrile, has a normal white blood cell count, has an elevated BNP, with a history of pulmonary edema. Patient was treated more for pulmonary edema also based on physical exam and history and was started on diuretic therapy and had a half an inch of nitroglycerin paste applied. He feels symptomatically improved and only urinated approximately 300 mL. Patient stable on supplemental oxygen and arrives with hypoxemia. I reviewed the case with the hospitalist team, further disposition and management depends upon their evaluation.   Assessment: Congestive heart  failure Renal failure      Plan:  Inpatient management           Jennye Moccasin, MD 04/08/15 [phone]

## 2015-04-08 NOTE — ED Notes (Signed)
X ray in room.

## 2015-04-08 NOTE — H&P (Signed)
Sean Delgado. is an 81 y.o. male.   Chief Complaint: Shortness of breath HPI: Presents with acute shortness of breath that started today. Had non productive cough. Reports being cold but no fever. No hx of aspiration. Recently had left anticubital fistula placed in anticipation of dialysis. Pt hypoxic on presentation and currently on oxygen.   Past Medical History  Diagnosis Date  . Chronic renal failure, stage 4 (severe) (HCC)   . Hypertension   . CHF (congestive heart failure) ([place])   . Gout   . Glaucoma   . Neuropathy ([place])   . Stroke ([place])   . Peripheral vascular disease ([place])   . Coronary artery disease   . A-fib ([place])   . Dysrhythmia   . Blindness of right eye   . Anemia     iron deficiency    Past Surgical History  Procedure Laterality Date  . Vascular surgery Bilateral     Carotid Endarterectomy  . Eye surgery Left     Cataract Extraction  . Av fistula placement Left 04/06/2015    Procedure: ARTERIOVENOUS (AV) FISTULA CREATION;  Surgeon: Algernon Huxley, MD;  Location: [facility] ORS;  Service: Vascular;  Laterality: Left;    Family History  Problem Relation Age of Onset  . Diabetes Mother    Social History:  reports that he quit smoking about 8 years ago. His smoking use included Cigarettes. He has a 3.75 pack-year smoking history. He has never used smokeless tobacco. He reports that he does not drink alcohol or use illicit drugs.  Allergies: No Known Allergies   (Not in a hospital admission)  Results for orders placed or performed during the hospital encounter of 04/08/15 (from the past 48 hour(s))  CBC with Differential/Platelet     Status: Abnormal   Collection Time: 04/08/15  4:04 PM  Result Value Ref Range   WBC 7.7 3.8 - 10.6 K/uL   RBC 3.20 (L) 4.40 - 5.90 MIL/uL   Hemoglobin 9.0 (L) 13.0 - 18.0 g/dL   HCT 28.6 (L) 40.0 - 52.0 %   MCV 89.2 80.0 - 100.0 fL   MCH 28.0 26.0 - 34.0 pg   MCHC 31.4 (L) 32.0 - 36.0 g/dL   RDW 17.0 (H) 11.5 - 14.5 %   Platelets  159 150 - 440 K/uL   Neutrophils Relative % 77 %   Neutro Abs 6.0 1.4 - 6.5 K/uL   Lymphocytes Relative 8 %   Lymphs Abs 0.6 (L) 1.0 - 3.6 K/uL   Monocytes Relative 10 %   Monocytes Absolute 0.7 0.2 - 1.0 K/uL   Eosinophils Relative 4 %   Eosinophils Absolute 0.3 0 - 0.7 K/uL   Basophils Relative 1 %   Basophils Absolute 0.1 0 - 0.1 K/uL  Comprehensive metabolic panel     Status: Abnormal   Collection Time: 04/08/15  4:05 PM  Result Value Ref Range   Sodium 144 135 - 145 mmol/L   Potassium 5.3 (H) 3.5 - 5.1 mmol/L   Chloride 111 101 - 111 mmol/L   CO2 22 22 - 32 mmol/L   Glucose, Bld 129 (H) 65 - 99 mg/dL   BUN 70 (H) 6 - 20 mg/dL   Creatinine, Ser 4.08 (H) 0.61 - 1.24 mg/dL   Calcium 7.8 (L) 8.9 - 10.3 mg/dL   Total Protein 6.9 6.5 - 8.1 g/dL   Albumin 3.0 (L) 3.5 - 5.0 g/dL   AST 32 15 - 41 U/L   ALT 24 17 -  63 U/L   Alkaline Phosphatase 87 38 - 126 U/L   Total Bilirubin 0.7 0.3 - 1.2 mg/dL   GFR calc non Af Amer 13 (L) >60 mL/min   GFR calc Af Amer 15 (L) >60 mL/min    Comment: (NOTE) The eGFR has been calculated using the CKD EPI equation. This calculation has not been validated in all clinical situations. eGFR's persistently <60 mL/min signify possible Chronic Kidney Disease.    Anion gap 11 5 - 15  Brain natriuretic peptide     Status: Abnormal   Collection Time: 04/08/15  4:21 PM  Result Value Ref Range   B Natriuretic Peptide 763.0 (H) 0.0 - 100.0 pg/mL   Dg Chest Port 1 View  04/08/2015   CLINICAL DATA:  Shortness of breath and decreased oxygen saturation  EXAM: PORTABLE CHEST 1 VIEW  COMPARISON:  March 06, 2015  FINDINGS: There is focal airspace opacity in the right upper lobe, concerning for pneumonia. There is generalized interstitial prominence with cardiomegaly suggesting a degree of underlying chronic congestive heart failure. No adenopathy. The pulmonary vascularity is within normal limits.  IMPRESSION: Right upper lobe pneumonia. Suspect a degree of  underlying chronic congestive heart failure.   Electronically Signed   By: Lowella Grip III M.D.   On: 04/08/2015 16:09    Review of Systems  Constitutional: Negative for fever and chills.  HENT: Negative for hearing loss.   Eyes: Negative for blurred vision.  Respiratory: Positive for shortness of breath.   Cardiovascular: Negative for chest pain.  Gastrointestinal: Negative for nausea and vomiting.  Genitourinary: Negative for dysuria.  Skin: Negative for rash.  Neurological: Negative for tingling.    Blood pressure 141/70, pulse 106, temperature 98.8 F (37.1 C), temperature source Oral, resp. rate 16, height 6' 1"  (1.854 m), weight 116.574 kg (257 lb), SpO2 95 %. Physical Exam  Constitutional: He is oriented to person, place, and time.  Obese male in mild resp distress  HENT:  Head: Normocephalic and atraumatic.  Mouth/Throat: Oropharynx is clear and moist. No oropharyngeal exudate.  Eyes: EOM are normal. Pupils are equal, round, and reactive to light. No scleral icterus.  Neck: Neck supple. No JVD present. No tracheal deviation present. No thyromegaly present.  Cardiovascular: Normal rate and regular rhythm.   No murmur heard. Respiratory:  Scattered rhonci No dullness to percussion Using accessary muscles  GI: Soft. Bowel sounds are normal. He exhibits distension. He exhibits no mass. There is no tenderness.  Musculoskeletal: Normal range of motion. He exhibits edema.  Lymphadenopathy:    He has no cervical adenopathy.  Neurological: He is alert and oriented to person, place, and time. No cranial nerve deficit.  Skin: Skin is warm and dry. No rash noted. No erythema.     Assessment/Plan 1. Acute Respiratory Failure: Secondary to pneumonia and CHF. Will diurese with IV lasix and treat underlying pneumonia. Continue oxygen support. Monitor closely if resp status worsens may need to place on bipap.  2. Pneumonia: CXR shows RUL pneumonia. Start IV zosyn and get  cultures.  3. CHF: Start IV lasix. If not effective may need dialysis.  4. Stage 4 CRF: Recently had fistula placed for future dialysis. Followed by [facility] nephrology. Will consult if needed.  5. HTN: Controlled with current meds.  Reviewed past medical records.  Time spent= 34mn  JBaxter Hire10/07/2014, 7:29 PM

## 2015-04-08 NOTE — Progress Notes (Signed)
ANTIBIOTIC CONSULT NOTE - INITIAL  Pharmacy Consult for Zosyn Indication: pneumonia  No Known Allergies  Patient Measurements: Height:  (185.4 cm) Weight: 256 lb 3.2 oz (116.212 kg) IBW/kg (Calculated) : 79.9 Adjusted Body Weight: 94.4 kg  Vital Signs: Temp: 98.2 F (36.8 C) (10/01 2034) Temp Source: Oral (10/01 2034) BP: 133/76 mmHg (10/01 2034) Pulse Rate: 106 (10/01 2034) Intake/Output from previous day:   Intake/Output from this shift:    Labs:  Recent Labs  04/08/15 1604 04/08/15 1605  WBC 7.7  --   HGB 9.0*  --   PLT 159  --   CREATININE  --  4.08*   Estimated Creatinine Clearance: 19.9 mL/min (by C-G formula based on Cr of 4.08). No results for input(s): VANCOTROUGH, VANCOPEAK, VANCORANDOM, GENTTROUGH, GENTPEAK, GENTRANDOM, TOBRATROUGH, TOBRAPEAK, TOBRARND, AMIKACINPEAK, AMIKACINTROU, AMIKACIN in the last 72 hours.   Microbiology: Recent Results (from the past 720 hour(s))  Surgical pcr screen     Status: Abnormal   Collection Time: 03/30/15  2:52 PM  Result Value Ref Range Status   MRSA, PCR NEGATIVE NEGATIVE Final   Staphylococcus aureus POSITIVE (A) NEGATIVE Final    Comment:        The Xpert SA Assay (FDA approved for NASAL specimens in patients over 44 years of age), is one component of a comprehensive surveillance program.  Test performance has been validated by [facility] for patients greater than or equal to 48 year old. It is not intended to diagnose infection nor to guide or monitor treatment.     Medical History: Past Medical History  Diagnosis Date  . Chronic renal failure, stage 4 (severe) (HCC)   . Hypertension   . CHF (congestive heart failure) (HCC)   . Gout   . Glaucoma   . Neuropathy (HCC)   . Stroke (HCC)   . Peripheral vascular disease (HCC)   . Coronary artery disease   . A-fib (HCC)   . Dysrhythmia   . Blindness of right eye   . Anemia     iron deficiency    Medications:  Infusions:   Assessment: 81  yom cc SOB that started today, nonproductive cough, recent left antecubital fistula placement in anticipation of dialysis, CrCl approximately 19 mL/min. CXR shows RUL pneumonia.   Goal of Therapy:  Resolve pneumonia  Plan:  Expected duration 7 days with resolution of temperature and/or normalization of WBC. Zosyn 3.375 gm IV Q12H EI.   Carola Frost, Pharm.D. Clinical Pharmacist 04/08/2015,9:42 PM

## 2015-04-09 LAB — BASIC METABOLIC PANEL
ANION GAP: 10 (ref 5–15)
BUN: 70 mg/dL — ABNORMAL HIGH (ref 6–20)
CALCIUM: 8.1 mg/dL — AB (ref 8.9–10.3)
CO2: 25 mmol/L (ref 22–32)
Chloride: 112 mmol/L — ABNORMAL HIGH (ref 101–111)
Creatinine, Ser: 3.91 mg/dL — ABNORMAL HIGH (ref 0.61–1.24)
GFR, EST AFRICAN AMERICAN: 16 mL/min — AB (ref >60)
GFR, EST NON AFRICAN AMERICAN: 13 mL/min — AB (ref >60)
Glucose, Bld: 102 mg/dL — ABNORMAL HIGH (ref 65–99)
POTASSIUM: 4.6 mmol/L (ref 3.5–5.1)
SODIUM: 147 mmol/L — AB (ref 135–145)

## 2015-04-09 LAB — MRSA PCR SCREENING: MRSA BY PCR: NEGATIVE

## 2015-04-09 MED ORDER — PIPERACILLIN-TAZOBACTAM 3.375 G IVPB
3.3750 g | Freq: Three times a day (TID) | INTRAVENOUS | Status: DC
  Administered 2015-04-09 – 2015-04-10 (×4): 3.375 g via INTRAVENOUS
  Filled 2015-04-09 (×7): qty 50

## 2015-04-09 NOTE — Progress Notes (Signed)
ANTIBIOTIC CONSULT NOTE - Follow-Up  Pharmacy Consult for Zosyn Indication: pneumonia  No Known Allergies  Patient Measurements: Height:  (185.4 cm) Weight: 256 lb 3.2 oz (116.212 kg) IBW/kg (Calculated) : 79.9 Adjusted Body Weight: 94.4 kg  Vital Signs: Temp: 98.3 F (36.8 C) (10/02 0755) Temp Source: Axillary (10/02 0755) BP: 123/66 mmHg (10/02 0755) Pulse Rate: 107 (10/02 0755) Intake/Output from previous day: 10/01 0701 - 10/02 0700 In: 50 [IV Piggyback:50] Out: -  Intake/Output from this shift:    Labs:  Recent Labs  04/08/15 1604 04/08/15 1605 04/09/15 0400  WBC 7.7  --   --   HGB 9.0*  --   --   PLT 159  --   --   CREATININE  --  4.08* 3.91*   Estimated Creatinine Clearance: 20.8 mL/min (by C-G formula based on Cr of 3.91). No results for input(s): VANCOTROUGH, VANCOPEAK, VANCORANDOM, GENTTROUGH, GENTPEAK, GENTRANDOM, TOBRATROUGH, TOBRAPEAK, TOBRARND, AMIKACINPEAK, AMIKACINTROU, AMIKACIN in the last 72 hours.   Microbiology: Recent Results (from the past 720 hour(s))  Surgical pcr screen     Status: Abnormal   Collection Time: 03/30/15  2:52 PM  Result Value Ref Range Status   MRSA, PCR NEGATIVE NEGATIVE Final   Staphylococcus aureus POSITIVE (A) NEGATIVE Final    Comment:        The Xpert SA Assay (FDA approved for NASAL specimens in patients over 34 years of age), is one component of a comprehensive surveillance program.  Test performance has been validated by [facility] for patients greater than or equal to 30 year old. It is not intended to diagnose infection nor to guide or monitor treatment.   MRSA PCR Screening     Status: None   Collection Time: 04/09/15  2:27 AM  Result Value Ref Range Status   MRSA by PCR NEGATIVE NEGATIVE Final    Comment:        The GeneXpert MRSA Assay (FDA approved for NASAL specimens only), is one component of a comprehensive MRSA colonization surveillance program. It is not intended to diagnose  MRSA infection nor to guide or monitor treatment for MRSA infections.     Medical History: Past Medical History  Diagnosis Date  . Chronic renal failure, stage 4 (severe) (HCC)   . Hypertension   . CHF (congestive heart failure) (HCC)   . Gout   . Glaucoma   . Neuropathy (HCC)   . Stroke (HCC)   . Peripheral vascular disease (HCC)   . Coronary artery disease   . A-fib (HCC)   . Dysrhythmia   . Blindness of right eye   . Anemia     iron deficiency    Assessment: Pharmacy consulted to dose Zosyn for 81 yo male admitted with SOB and nonproductive cough and chest x-ray significant for RUL PNA. Patient had had recent left antecubital fistula placement in anticipation of dialysis.     Goal of Therapy:  Resolve pneumonia  Plan:  Patient with stage IV CKD. Will transition patient to Zosyn 3.375 gm IV Q8H EI in setting of improved renal function. If renal function declines or patient is initiated on dialysis, will resume Zosyn EI 3.375g IV Q8hr.    Pharmacy will continue to monitor and adjust per consult.     Rahshawn Remo L, Pharm.D. Clinical Pharmacist 04/09/2015,11:00 AM

## 2015-04-09 NOTE — Care Management Note (Signed)
Case Management Note  Patient Details  Name: Aristides Luckey. MRN: [id number] Date of Birth: 23-Jan-1934  Subjective/Objective:   A consult request was sent to Case Management requesting LTAC for Mr Schmieder who is from [facility]. Mr Garringer was admitted to [facility] on 04/08/15. Wilford Grist CSW will see Mr Wach today and discuss with this Clinical research associate as to appropriateness for LTAC.                  Action/Plan:   Expected Discharge Date:  04/10/15               Expected Discharge Plan:     In-House Referral:     Discharge planning Services     Post Acute Care Choice:    Choice offered to:     DME Arranged:    DME Agency:     HH Arranged:    HH Agency:     Status of Service:     Medicare Important Message Given:    Date Medicare IM Given:    Medicare IM give by:    Date Additional Medicare IM Given:    Additional Medicare Important Message give by:     If discussed at Long Length of Stay Meetings, dates discussed:    Additional Comments:  Edmundo Tedesco A, RN 04/09/2015, 9:53 AM

## 2015-04-09 NOTE — Clinical Social Work Note (Signed)
Clinical Social Work Assessment  Patient Details  Name: Sean Windmiller Jr. MRN: [id number] Date of Birth: 1934/01/02  Date of referral:  04/09/15               Reason for consult:   (from [facility])                Permission sought tTalan Gildnerormation with:  Facility Medical sales representative, Family Supports Permission granted to share information::  Yes, Verbal Permission Granted  Name::      ( Sister  Rivka Barbara and Niece  Education officer, museum)  Agency::     Relationship::     Contact Information:     Housing/Transportation Living arrangements for the past 2 months:  Skilled Nursing Facility, Single Family Home Source of Information:  Patient Patient Interpreter Needed:  None Criminal Activity/Legal Involvement Pertinent to Current Situation/Hospitalization:  No - Comment as needed Significant Relationships:  Adult Children, Merchandiser, retail, Siblings, Other Family Members Lives with:  Self, Facility Resident Do you feel safe going back to the place where you live?  No (not until able to care for self) Need for family participation in patient care:  Yes (Comment)  Care giving concerns:   None reported   Social Worker assessment / plan:  CSW in to assess patient.  He is alert and oriented.  Short of breath but engaged in conversation.  Patient is a 81 year old male, states he has lived at [facility] for the past month for rehab.  Patient is a [facility] patient PACE social worker is April [phone].  Prior to going to [facility], patient lived home alone.  Patient states his primary support is from his sister Rivka Barbara and niece Louie Bun.  Per patient he plans to return to [facility] at discharge until he is better and able to return to his home.  CSW will complete FL2 and place on chart in anticipation of patient returning to [facility] when medically stable.      Employment status:  Disabled (Comment on whether or not currently receiving Disability) Insurance information:  Managed Medicare PT  Recommendations:  Not assessed at this time Information / Referral to community resources:  Skilled Nursing Facility  Patient/Family's Response to care:  Patient was appreciative of talking with CSW and assisting with process to get him back to [facility]   Patient/Family's Understanding of and Emotional Response to Diagnosis, Current Treatment, and Prognosis:  Patient understand he will remain in the hospital until he is medically clear and will discharge back to [facility].   Emotional Assessment Appearance:  Appears stated age Attitude/Demeanor/Rapport:    Affect (typically observed):  Adaptable, Appropriate, Calm, Pleasant Orientation:  Oriented to Self, Oriented to Place, Oriented to  Time, Oriented to Situation Alcohol / Substance use:  Never Used Psych involvement (Current and /or in the community):  No (Comment) (none reported )  Discharge Needs  Concerns to be addressed:  Denies Needs/Concerns at this time Readmission within the last 30 days:  No Current discharge risk:  Chronically ill, Dependent with Mobility Barriers to Discharge:  No Barriers Identified   Soundra Pilon, LCSW 04/09/2015, 3:46 PM Sammuel Hines. Theresia Majors, MSW Clinical Social Work Department Emergency Room [phone] 3:49 PM

## 2015-04-09 NOTE — Progress Notes (Addendum)
[facility] - [place] at [facility]   PATIENT NAME: Sean Delgado    MR#:  [id number]  DATE OF BIRTH:  01-Jul-1934  SUBJECTIVE:  CHIEF COMPLAINT:   Chief Complaint  Patient presents with  . Shortness of Breath  the patient is a 81 year old African-American male with past medical history significant for history of CAD, stroke, peripheral vascular disease, coronary artery disease, multiple other medical problems who presents to the hospital with complaints of acute shortness of breath, nonproductive cough. On arrival to emergency room, he was noted to have right upper lobe pneumonia and admitted to the hospital for further evaluation and treatment. His oxygen saturations remained relatively low at 90% on oxygen therapy through nasal cannula. He is tachycardic.   Review of Systems  Constitutional: Negative for fever, chills and weight loss.  HENT: Negative for congestion.   Eyes: Negative for blurred vision and double vision.  Respiratory: Positive for cough and shortness of breath. Negative for sputum production and wheezing.   Cardiovascular: Negative for chest pain, palpitations, orthopnea, leg swelling and PND.  Gastrointestinal: Negative for nausea, vomiting, abdominal pain, diarrhea, constipation and blood in stool.  Genitourinary: Negative for dysuria, urgency, frequency and hematuria.  Musculoskeletal: Negative for falls.  Neurological: Negative for dizziness, tremors, focal weakness and headaches.  Endo/Heme/Allergies: Does not bruise/bleed easily.  Psychiatric/Behavioral: Negative for depression. The patient does not have insomnia.     VITAL SIGNS: Blood pressure 123/66, pulse 107, temperature 98.3 F (36.8 C), temperature source Axillary, resp. rate 18, height  (1.854 m), weight 116.212 kg (256 lb 3.2 oz), SpO2 90 %.  PHYSICAL EXAMINATION:   GENERAL:  81 y.o.-year-old patient sitting in the bed with no acute distress. Slurring speech, somewhat  somnolent.  EYES: Pupils equal, round, reactive to light and accommodation. No scleral icterus. Extraocular muscles intact.  HEENT: Head atraumatic, normocephalic. Oropharynx and nasopharynx clear. Slurring speech NECK:  Supple, no jugular venous distention. No thyroid enlargement, no tenderness.  LUNGS: Normal breath sounds bilaterally, no wheezing, rales,rhonchi or crepitation. Somewhat diminished breath sounds posteriorly and on the right upper lung field. No use of accessory muscles of respiration.  CARDIOVASCULAR: S1, S2 normal. No murmurs, rubs, or gallops.  ABDOMEN: Soft, nontender, nondistended. Bowel sounds present. No organomegaly or mass.  EXTREMITIES: No pedal edema, cyanosis, or clubbing.  NEUROLOGIC: Cranial nerves II through XII are intact. Muscle strength not able to evaluate due to somnolence and weakness. Sensation grossly intact. Gait not checked. Has difficulty sitting up in the bed, needs 2 person help.  PSYCHIATRIC: The patient is somnolent and difficult to assess his orientation  SKIN: No obvious rash, lesion, or ulcer.   ORDERS/RESULTS REVIEWED:   CBC  Recent Labs Lab 04/08/15 1604  WBC 7.7  HGB 9.0*  HCT 28.6*  PLT 159  MCV 89.2  MCH 28.0  MCHC 31.4*  RDW 17.0*  LYMPHSABS 0.6*  MONOABS 0.7  EOSABS 0.3  BASOSABS 0.1   ------------------------------------------------------------------------------------------------------------------  Chemistries   Recent Labs Lab 04/08/15 1605 04/09/15 0400  NA 144 147*  K 5.3* 4.6  CL 111 112*  CO2 22 25  GLUCOSE 129* 102*  BUN 70* 70*  CREATININE 4.08* 3.91*  CALCIUM 7.8* 8.1*  AST 32  --   ALT 24  --   ALKPHOS 87  --   BILITOT 0.7  --    ------------------------------------------------------------------------------------------------------------------ estimated creatinine clearance is 20.8 mL/min (by C-G formula based on Cr of  3.91). ------------------------------------------------------------------------------------------------------------------ No results for input(s): TSH, T4TOTAL,  T3FREE, THYROIDAB in the last 72 hours.  Invalid input(s): FREET3  Cardiac Enzymes  Recent Labs Lab 04/08/15 1604  TROPONINI 0.03   ------------------------------------------------------------------------------------------------------------------ Invalid input(s): POCBNP ---------------------------------------------------------------------------------------------------------------  RADIOLOGY: Dg Chest Port 1 View  04/08/2015   CLINICAL DATA:  Shortness of breath and decreased oxygen saturation  EXAM: PORTABLE CHEST 1 VIEW  COMPARISON:  March 06, 2015  FINDINGS: There is focal airspace opacity in the right upper lobe, concerning for pneumonia. There is generalized interstitial prominence with cardiomegaly suggesting a degree of underlying chronic congestive heart failure. No adenopathy. The pulmonary vascularity is within normal limits.  IMPRESSION: Right upper lobe pneumonia. Suspect a degree of underlying chronic congestive heart failure.   Electronically Signed   By: Bretta Bang III M.D.   On: 04/08/2015 16:09    EKG:  Orders placed or performed during the hospital encounter of 04/08/15  . EKG 12-Lead  . EKG 12-Lead  . ED EKG  . ED EKG    ASSESSMENT AND PLAN:  Active Problems:   Acute respiratory failure (HCC) 1. Acute respiratory failure with hypoxia due to pneumonia and congestive heart failure, acute pulmonary edema, continue oxygen therapy, keeping pulse oximeter at around 94% and above 2. Right upper lobe bacterial pneumonia of unclear but the bacteriologic agent, concerning for possible aspiration pneumonitis, continue patient on Zosyn, following sputum cultures 3. Dysphagia, concerning for aspiration pneumonitis. Get speech therapist involved for further recommendations. Change diet to dysphagia 3 with honey  thick liquids for now 4.  Acute pulmonary edema due to chronic renal failure, continue diuretics intravenously for now, get nephrologist involved for further recommendations. Patient has left upper extremity AV fistula recently placed. Echocardiogram done in August 2016 showed normal ejection fraction but moderate MR.  5. Hyperkalemia, resolved with therapy 6. Anemia. Get Hemoccult 7. Generalized weakness with physical therapist involved for further recommendations. Likely skilled nursing facility placement   Management plans discussed with the patient, family and they are in agreement.   DRUG ALLERGIES: No Known Allergies  CODE STATUS:     Code Status Orders        Start     Ordered   04/08/15 2032  Full code   Continuous     04/08/15 2031      TOTAL TIME TAKING CARE OF THIS PATIENT: 45 minutes .    Katharina Caper M.D on 04/09/2015 at 10:48 AM  Between 7am to 6pm - Pager - [phone]  After 6pm go to [email or web address] - password [email or web address] [facility]  [place]  [facility]  Office  [phone]  CC: Primary care physician; Bobbye Morton, MD

## 2015-04-10 ENCOUNTER — Inpatient Hospital Stay: Payer: Medicare (Managed Care)

## 2015-04-10 DIAGNOSIS — R000 Tachycardia, unspecified: Secondary | ICD-10-CM

## 2015-04-10 LAB — BASIC METABOLIC PANEL
ANION GAP: 10 (ref 5–15)
BUN: 75 mg/dL — ABNORMAL HIGH (ref 6–20)
CALCIUM: 8.1 mg/dL — AB (ref 8.9–10.3)
CHLORIDE: 107 mmol/L (ref 101–111)
CO2: 25 mmol/L (ref 22–32)
CREATININE: 4.63 mg/dL — AB (ref 0.61–1.24)
GFR calc non Af Amer: 11 mL/min — ABNORMAL LOW (ref >60)
GFR, EST AFRICAN AMERICAN: 13 mL/min — AB (ref >60)
Glucose, Bld: 141 mg/dL — ABNORMAL HIGH (ref 65–99)
Potassium: 4.6 mmol/L (ref 3.5–5.1)
SODIUM: 142 mmol/L (ref 135–145)

## 2015-04-10 LAB — BLOOD GAS, ARTERIAL
ACID-BASE EXCESS: 0.6 mmol/L (ref 0.0–3.0)
Allens test (pass/fail): POSITIVE — AB
Bicarbonate: 27.4 mEq/L (ref 21.0–28.0)
FIO2: 0.36
O2 SAT: 88.6 %
PATIENT TEMPERATURE: 37
PCO2 ART: 52 mmHg — AB (ref 32.0–48.0)
PO2 ART: 60 mmHg — AB (ref 83.0–108.0)
pH, Arterial: 7.33 — ABNORMAL LOW (ref 7.350–7.450)

## 2015-04-10 MED ORDER — INFLUENZA VAC SPLIT QUAD 0.5 ML IM SUSY
0.5000 mL | PREFILLED_SYRINGE | INTRAMUSCULAR | Status: DC
  Filled 2015-04-10: qty 0.5

## 2015-04-10 MED ORDER — HYDROCODONE-ACETAMINOPHEN 5-325 MG PO TABS: 1.0000 | ORAL_TABLET | Freq: Four times a day (QID) | ORAL | Status: DC | PRN

## 2015-04-10 MED ORDER — METOPROLOL TARTRATE 1 MG/ML IV SOLN: 5.0000 mg | INTRAVENOUS | Status: DC

## 2015-04-10 MED ORDER — PIPERACILLIN-TAZOBACTAM 3.375 G IVPB
3.3750 g | Freq: Two times a day (BID) | INTRAVENOUS | Status: DC
  Administered 2015-04-10: 3.375 g via INTRAVENOUS
  Filled 2015-04-10 (×3): qty 50

## 2015-04-10 MED ORDER — HYDRALAZINE HCL 50 MG PO TABS
50.0000 mg | ORAL_TABLET | Freq: Three times a day (TID) | ORAL | Status: DC
  Administered 2015-04-10 – 2015-04-11 (×2): 50 mg via ORAL
  Filled 2015-04-10 (×2): qty 1

## 2015-04-10 MED ORDER — DILTIAZEM HCL 30 MG PO TABS
30.0000 mg | ORAL_TABLET | Freq: Three times a day (TID) | ORAL | Status: DC
  Administered 2015-04-10 – 2015-04-11 (×2): 30 mg via ORAL
  Filled 2015-04-10 (×5): qty 1

## 2015-04-10 NOTE — Progress Notes (Signed)
Per MD patient is not medically stable for D/C today. Plan is for patient to return to [facility] for rehab. Per Gavin Pound admissions coordinator at [facility] patient can return. CSW also spoke with [place] from [facility]. Morrie Sheldon reported that [facility] has approved for patient to return to [facility]. CSW also made Morrie Sheldon aware that MD is requesting patient get a sleep study and may need CPAP at night. Per Morrie Sheldon she will follow up with that with [facility]. CSW will continue to follow and assist as needed.   Jetta Lout, LCSWA [phone]

## 2015-04-10 NOTE — Progress Notes (Signed)
Supervisor notified me that there was a contraindication with IV metoprolol push and the pts respiratory  status. MD Wieting notified d/c IV Metoprolol and ordered PO Cardizem. Will continue to monitor.

## 2015-04-10 NOTE — Progress Notes (Signed)
Received a call from telemetry  that the pt is running A Fib with rapid ventricular response with premature ventricular, HR 114. MD Goru notified. New orders placed. Will continue to monitor

## 2015-04-10 NOTE — Care Management Important Message (Signed)
Important Message  Patient Details  Name: Sean Delgado. MRN: [id number] Date of Birth: 04/24/1934   Medicare Important Message Given:  Yes-second notification given    Jolee Ewing, RN 04/10/2015, 4:01 PM

## 2015-04-10 NOTE — Clinical Documentation Improvement (Signed)
Internal Medicine  Can the diagnosis of CHF be further specified?    Acuity - Acute, Chronic, Acute on Chronic   Type - Systolic, Diastolic, Systolic and Diastolic  Other  Clinically Undetermined  Document any associated diagnoses/conditions  Supporting Information: (as per notes) "CHF: Start IV lasix".   Labs: Component     Latest Ref Rng 03/04/2015 04/08/2015  B Natriuretic Peptide     0.0 - 100.0 pg/mL 560.0 (H) 763.0 (H)   Medications: furosemide (LASIX) injection 60 mg   Ordered Dose: 60 mg Route: Intravenous Frequency: Every 12 hours  Please exercise your independent, professional judgment when responding. A specific answer is not anticipated or expected.  Thank You, Nevin Bloodgood, RN, BSN, CCDS,Clinical Documentation Specialist:  [phone]  [phone]=[employer] [place]- Health Information Management

## 2015-04-10 NOTE — Anesthesia Postprocedure Evaluation (Signed)
  Anesthesia Post-op Note  Patient: Sean Delgado.  Procedure(s) Performed: Procedure(s): ARTERIOVENOUS (AV) FISTULA CREATION (Left)  Anesthesia type:General  Patient location: PACU  Post pain: Pain level controlled  Post assessment: Post-op Vital signs reviewed, [name] Cardiovascular Status Stable, Respiratory Function Stable, Patent Airway and No signs of Nausea or vomiting  Post vital signs: Reviewed and stable  Last Vitals:  Filed Vitals:   04/06/15 1709  BP: 132/66  Pulse: 96  Temp: 36.3 C  Resp: 16    Level of consciousness: awake, alert  and patient cooperative  Complications: No apparent anesthesia complications

## 2015-04-10 NOTE — Progress Notes (Signed)
Initial Nutrition Assessment   INTERVENTION:   Meals and Snacks: Cater to patient preferences within diet restrictions, SLP following  Medical Food Supplement Therapy: noted Magic Cup at lunch instead of ice cream per SLP diet order   NUTRITION DIAGNOSIS:   Swallowing difficulty related to dysphagia as evidenced by  (thickened liquids, SLP following).  GOAL:   Patient will meet greater than or equal to 90% of their needs  MONITOR:    (Energy Intake, Anthropometrics, Digestive System)  REASON FOR ASSESSMENT:    (Thickened liquids)    ASSESSMENT:    Pt admitted from [facility] with acute respiratory failure secondary to pna and CHF.   Past Medical History  Diagnosis Date  . Chronic renal failure, stage 4 (severe) (HCC)   . Hypertension   . CHF (congestive heart failure) (HCC)   . Gout   . Glaucoma   . Neuropathy (HCC)   . Stroke (HCC)   . Peripheral vascular disease (HCC)   . Coronary artery disease   . A-fib (HCC)   . Dysrhythmia   . Blindness of right eye   . Anemia     iron deficiency     Diet Order:  DIET DYS 3 Room service appropriate?: Yes; Fluid consistency:: Nectar Thick    Current Nutrition: Pt waiting to be fed lunch on visit today. Pt had just had SLP evaluation. Recorded 75-100% of meals while admitted.  Food/Nutrition-Related History: Pt reports good appetite PTA eating 3 meals per day likes chocolate ice cream.   Medications: ferrous sulfate, lasix,  MVI, senokot, kayexelate  Electrolyte/Renal Profile and Glucose Profile:   Recent Labs Lab 04/08/15 1605 04/09/15 0400 04/10/15 1314  NA 144 147* 142  K 5.3* 4.6 4.6  CL 111 112* 107  CO2 BUN 70* 70* 75*  CREATININE 4.08* 3.91* 4.63*  CALCIUM 7.8* 8.1* 8.1*  GLUCOSE 129* 102* 141*   Protein Profile:  Recent Labs Lab 04/08/15 1605  ALBUMIN 3.0*    Gastrointestinal Profile: Last BM:   Nutrition-Focused Physical Exam Findings:  Unable to complete  Nutrition-Focused physical exam at this time.    Weight Change: Per CHL weight gain PTA   Height:   Ht Readings from Last 1 Encounters:  04/08/15  (1.854 m)    Weight:   Wt Readings from Last 1 Encounters:  04/08/15 256 lb 3.2 oz (116.212 kg)   Wt Readings from Last 10 Encounters:  04/08/15 256 lb 3.2 oz (116.212 kg)  03/30/15 257 lb (116.574 kg)  03/11/15 239 lb 12.8 oz (108.773 kg)      BMI:  Body mass index is 33.81 kg/(m^2).  Estimated Nutritional Needs:   Kcal:  2115-2503kcals, BEE: 1444kcals, TEE: (IF 1.1-1.3)(AF 1.2) using IBW of 83.6kg  Protein:  84-100g protein (1.0-1.2g/kg) using IBW of 83.6kg  Fluid:  2090-2519mL of fluid (25-18mL/kg) uwing IBW of 83.6kg  EDUCATION NEEDS:   Education needs no appropriate at this time    MODERATE Care Level  Leda Quail, RD, LDN Pager [phone]

## 2015-04-10 NOTE — Progress Notes (Signed)
ANTIBIOTIC CONSULT NOTE - Follow-Up  Pharmacy Consult for Zosyn Indication: pneumonia  No Known Allergies  Patient Measurements: Height:  (185.4 cm) Weight: 256 lb 3.2 oz (116.212 kg) IBW/kg (Calculated) : 79.9 Adjusted Body Weight: 94.4 kg  Vital Signs: Temp: 99 F (37.2 C) (10/03 0716) Temp Source: Oral (10/03 0716) BP: 152/65 mmHg (10/03 1110) Pulse Rate: 89 (10/03 1110) Intake/Output from previous day: 10/02 0701 - 10/03 0700 In: 530 [P.O.:480; IV Piggyback:50] Out: -  Intake/Output from this shift:    Labs:  Recent Labs  04/08/15 1604 04/08/15 1605 04/09/15 0400 04/10/15 1314  WBC 7.7  --   --   --   HGB 9.0*  --   --   --   PLT 159  --   --   --   CREATININE  --  4.08* 3.91* 4.63*   Estimated Creatinine Clearance: 17.6 mL/min (by C-G formula based on Cr of 4.63). No results for input(s): VANCOTROUGH, VANCOPEAK, VANCORANDOM, GENTTROUGH, GENTPEAK, GENTRANDOM, TOBRATROUGH, TOBRAPEAK, TOBRARND, AMIKACINPEAK, AMIKACINTROU, AMIKACIN in the last 72 hours.   Microbiology: Recent Results (from the past 720 hour(s))  Surgical pcr screen     Status: Abnormal   Collection Time: 03/30/15  2:52 PM  Result Value Ref Range Status   MRSA, PCR NEGATIVE NEGATIVE Final   Staphylococcus aureus POSITIVE (A) NEGATIVE Final    Comment:        The Xpert SA Assay (FDA approved for NASAL specimens in patients over 56 years of age), is one component of a comprehensive surveillance program.  Test performance has been validated by [facility] for patients greater than or equal to 76 year old. It is not intended to diagnose infection nor to guide or monitor treatment.   MRSA PCR Screening     Status: None   Collection Time: 04/09/15  2:27 AM  Result Value Ref Range Status   MRSA by PCR NEGATIVE NEGATIVE Final    Comment:        The GeneXpert MRSA Assay (FDA approved for NASAL specimens only), is one component of a comprehensive MRSA colonization surveillance  program. It is not intended to diagnose MRSA infection nor to guide or monitor treatment for MRSA infections.     Medical History: Past Medical History  Diagnosis Date  . Chronic renal failure, stage 4 (severe) (HCC)   . Hypertension   . CHF (congestive heart failure) (HCC)   . Gout   . Glaucoma   . Neuropathy (HCC)   . Stroke (HCC)   . Peripheral vascular disease (HCC)   . Coronary artery disease   . A-fib (HCC)   . Dysrhythmia   . Blindness of right eye   . Anemia     iron deficiency    Assessment: Pharmacy consulted to dose Zosyn for 81 yo male admitted with SOB and nonproductive cough and chest x-ray significant for RUL PNA. Patient had had recent left antecubital fistula placement in anticipation of dialysis.     Goal of Therapy:  Resolve pneumonia  Plan:  Patient with stage IV CKD. Will transition patient to Zosyn 3.375 gm IV Q12H EI as renal function has declined.  Pharmacy will continue to monitor and adjust per consult.     Desa Rech G, Pharm.D. Clinical Pharmacist 04/10/2015,3:20 PM

## 2015-04-10 NOTE — Progress Notes (Addendum)
[facility] - [place] at [facility]   PATIENT NAME: Sean Delgado    MR#:  [id number]  DATE OF BIRTH:  1934-01-31  SUBJECTIVE:  CHIEF COMPLAINT:   Chief Complaint  Patient presents with  . Shortness of Breath  the patient is a 81 year old African-American male with past medical history significant for history of CAD, stroke, peripheral vascular disease, coronary artery disease, multiple other medical problems who presents to the hospital with complaints of acute shortness of breath, nonproductive cough. On arrival to emergency room, he was noted to have right upper lobe pneumonia and admitted to the hospital for further evaluation and treatment. His oxygen saturations remains  low at 89% on oxygen therapy through nasal cannula. Tachycardia, resolved. Remains somnolent.  ABGs performed on oxygen therapy revealed a pH of 7.33, PCO2 52, patient was initiated on BiPAP. He feels quite good today. Denies any significant shortness of breath or cough  Review of Systems  Constitutional: Negative for fever, chills and weight loss.  HENT: Negative for congestion.   Eyes: Negative for blurred vision and double vision.  Respiratory: Positive for cough and shortness of breath. Negative for sputum production and wheezing.   Cardiovascular: Negative for chest pain, palpitations, orthopnea, leg swelling and PND.  Gastrointestinal: Negative for nausea, vomiting, abdominal pain, diarrhea, constipation and blood in stool.  Genitourinary: Negative for dysuria, urgency, frequency and hematuria.  Musculoskeletal: Negative for falls.  Neurological: Negative for dizziness, tremors, focal weakness and headaches.  Endo/Heme/Allergies: Does not bruise/bleed easily.  Psychiatric/Behavioral: Negative for depression. The patient does not have insomnia.     VITAL SIGNS: Blood pressure 152/65, pulse 89, temperature 99 F (37.2 C), temperature source Oral, resp. rate 20, height  (1.854 m),  weight 116.212 kg (256 lb 3.2 oz), SpO2 100 %.  PHYSICAL EXAMINATION:   GENERAL:  81 y.o.-year-old patient sitting in the bed with no acute distress. Slurring speech, somnolent.  EYES: Pupils equal, round, reactive to light and accommodation. No scleral icterus. Extraocular muscles intact.  HEENT: Head atraumatic, normocephalic. Oropharynx and nasopharynx clear. Slurring speech NECK:  Supple, no jugular venous distention. No thyroid enlargement, no tenderness.  LUNGS: Normal breath sounds bilaterally, no wheezing, rales,rhonchi or crepitation. Somewhat diminished breath sounds posteriorly and on the right upper lung field. No use of accessory muscles of respiration. She had basis CARDIOVASCULAR: S1, S2 normal. No murmurs, rubs, or gallops.  ABDOMEN: Soft, nontender, nondistended. Bowel sounds present. No organomegaly or mass.  EXTREMITIES: No pedal edema, cyanosis, or clubbing.  NEUROLOGIC: Cranial nerves II through XII are intact. Muscle strength not able to evaluate due to somnolence and weakness. Sensation grossly intact. Gait not checked. Has difficulty sitting up in the bed, needs 2 person help.  PSYCHIATRIC: The patient is somnolent and difficult to assess his orientation  SKIN: No obvious rash, lesion, or ulcer.   ORDERS/RESULTS REVIEWED:   CBC  Recent Labs Lab 04/08/15 1604  WBC 7.7  HGB 9.0*  HCT 28.6*  PLT 159  MCV 89.2  MCH 28.0  MCHC 31.4*  RDW 17.0*  LYMPHSABS 0.6*  MONOABS 0.7  EOSABS 0.3  BASOSABS 0.1   ------------------------------------------------------------------------------------------------------------------  Chemistries   Recent Labs Lab 04/08/15 1605 04/09/15 0400  NA 144 147*  K 5.3* 4.6  CL 111 112*  CO2 22 25  GLUCOSE 129* 102*  BUN 70* 70*  CREATININE 4.08* 3.91*  CALCIUM 7.8* 8.1*  AST 32  --   ALT 24  --   ALKPHOS 87  --  BILITOT 0.7  --     ------------------------------------------------------------------------------------------------------------------ estimated creatinine clearance is 20.8 mL/min (by C-G formula based on Cr of 3.91). ------------------------------------------------------------------------------------------------------------------ No results for input(s): TSH, T4TOTAL, T3FREE, THYROIDAB in the last 72 hours.  Invalid input(s): FREET3  Cardiac Enzymes  Recent Labs Lab 04/08/15 1604  TROPONINI 0.03   ------------------------------------------------------------------------------------------------------------------ Invalid input(s): POCBNP ---------------------------------------------------------------------------------------------------------------  RADIOLOGY: Dg Chest Port 1 View  04/08/2015   CLINICAL DATA:  Shortness of breath and decreased oxygen saturation  EXAM: PORTABLE CHEST 1 VIEW  COMPARISON:  March 06, 2015  FINDINGS: There is focal airspace opacity in the right upper lobe, concerning for pneumonia. There is generalized interstitial prominence with cardiomegaly suggesting a degree of underlying chronic congestive heart failure. No adenopathy. The pulmonary vascularity is within normal limits.  IMPRESSION: Right upper lobe pneumonia. Suspect a degree of underlying chronic congestive heart failure.   Electronically Signed   By: Bretta Bang III M.D.   On: 04/08/2015 16:09    EKG:  Orders placed or performed during the hospital encounter of 04/08/15  . EKG 12-Lead  . EKG 12-Lead  . ED EKG  . ED EKG    ASSESSMENT AND PLAN:  Active Problems:   Acute respiratory failure (HCC) 1. Acute respiratory failure with hypoxia and hypercapnia due to pneumonia and acute on chronic diastolic congestive heart failure,  continue oxygen therapy, keeping pulse oximeter at around 94% and above, now also patient has been placed on BiPAP intermittently. He would benefit from sleep study as outpatient to rule  out obstructive sleep apnea 2. Right upper lobe bacterial pneumonia of unclear bacteriologic agent, concerning for possible aspiration pneumonitis, continue patient on Zosyn, get sputum cultures, if possible 3. Dysphagia, concerning for aspiration pneumonitis. Get speech therapist involved for further recommendations. Continue dysphagia 3 with honey thick liquids for now 4.  Acute on chronic diastolic CHF due to chronic renal failure, continue diuretics intravenously for now, getting nephrologist involved for further recommendations. Patient has left upper extremity AV fistula recently placed. Echocardiogram done in August 2016 showed normal ejection fraction but moderate MR.  5. Hyperkalemia, resolved with therapy 6. Anemia. Get Hemoccult 7. Generalized weakness with physical therapist involved for further recommendations. Likely skilled nursing facility placement   Management plans discussed with the patient, family and they are in agreement.   DRUG ALLERGIES: No Known Allergies  CODE STATUS:     Code Status Orders        Start     Ordered   04/08/15 2032  Full code   Continuous     04/08/15 2031      TOTAL TIME TAKING CARE OF THIS PATIENT: 45 minutes .  Discussed this patient's family member, Ms. Rivka Barbara who requests [facility] program to be informed that patient cannot take care of himself in the apartment and she requests skilled nursing facility placement, all questions were onset. She voiced understanding . time spent approximately 15 minutes for discussion  Lailah Marcelli M.D on 04/10/2015 at 12:41 PM  Between 7am to 6pm - Pager - [phone]  After 6pm go to [email or web address] - password [email or web address] [facility]  [place] [facility]  Office  [phone]  CC: Primary care physician; Bobbye Morton, MD

## 2015-04-10 NOTE — Care Management Note (Signed)
Case Management Note  Patient Details  Name: Sean Delgado. MRN: [id number] Date of Birth: [birth year]  Subjective/Objective:     [name] is from [facility]. [facility] Social Work is following his hospital stay with current plans for him to return to [facility] after discharge.,                Action/Plan:   Expected Discharge Date:  04/10/15               Expected Discharge Plan:     In-House Referral:     Discharge planning Services     Post Acute Care Choice:    Choice offered to:     DME Arranged:    DME Agency:     HH Arranged:    HH Agency:     Status of Service:     Medicare Important Message Given:  Yes-second notification given Date Medicare IM Given:    Medicare IM give by:    Date Additional Medicare IM Given:    Additional Medicare Important Message give by:     If discussed at Long Length of Stay Meetings, dates discussed:    Additional Comments:  Shaune Malacara A, RN 04/10/2015, 8:27 AM

## 2015-04-10 NOTE — Evaluation (Signed)
Clinical/Bedside Swallow Evaluation Patient Details  Name: Sean Delgado. MRN: [id number] Date of Birth: 1933/07/26  Today's Date: 04/10/2015 Time: SLP Start Time (ACUTE ONLY): 1015 SLP Stop Time (ACUTE ONLY): 1105 SLP Time Calculation (min) (ACUTE ONLY): 50 min  Past Medical History:  Past Medical History  Diagnosis Date  . Chronic renal failure, stage 4 (severe) (HCC)   . Hypertension   . CHF (congestive heart failure) (HCC)   . Gout   . Glaucoma   . Neuropathy (HCC)   . Stroke (HCC)   . Peripheral vascular disease (HCC)   . Coronary artery disease   . A-fib (HCC)   . Dysrhythmia   . Blindness of right eye   . Anemia     iron deficiency   Past Surgical History:  Past Surgical History  Procedure Laterality Date  . Vascular surgery Bilateral     Carotid Endarterectomy  . Eye surgery Left     Cataract Extraction  . Av fistula placement Left 04/06/2015    Procedure: ARTERIOVENOUS (AV) FISTULA CREATION;  Surgeon: Annice Needy, MD;  Location: [facility] ORS;  Service: Vascular;  Laterality: Left;   HPI:  Presents with acute shortness of breath that started today. Had non productive cough. Reports being cold but no fever. No hx of aspiration. Recently had left anticubital fistula placed in anticipation of dialysis. Pt hypoxic on presentation and currently on oxygen.    Assessment / Plan / Recommendation Clinical Impression  Pt presents with pharyngeal phase dysphagia c/b coughing after thin liquid trials by cup (both single and multiple sips).  Pt. better tolerated thickened beverage with no immediate/overt s/s of aspiration noted (consumed ~2 ounces by cup, assisted by ST). Pt is blind in R eye and requires assist. Oral phase was adaquate with no signiticant deficits noted. Pt would benefit from a dysphagia 3 diet with thickened liquids -- start with nectar, consider honey if necessary. Strict aspiration precautions. Meds whole in puree. NSG updated.  Assistance required at meals due to  vision deficits at baseline. Pt required verbal cues (mod) to keep eyes open during PO trials -- Pt would answer questions at times with his eyes closed; slightly drowsy presentation.    Aspiration Risk  Mild    Diet Recommendation Dysphagia 3 (Mech soft);Nectar   Medication Administration: Whole meds with puree Compensations: Slow rate;Small sips/bites;Follow solids with liquid    Other  Recommendations Oral Care Recommendations: Oral care BID;Staff/trained caregiver to provide oral care   Follow Up Recommendations       Frequency and Duration min 2x/week  1 week   Pertinent Vitals/Pain None reported    SLP Swallow Goals  See plan of care.   Swallow Study Prior Functional Status   Pt was on a regular diet and living at SNF prior to admission.    General Date of Onset: 04/08/15 Other Pertinent Information: Presents with acute shortness of breath that started today. Had non productive cough. Reports being cold but no fever. No hx of aspiration. Recently had left anticubital fistula placed in anticipation of dialysis. Pt hypoxic on presentation and currently on oxygen.  Type of Study: Bedside swallow evaluation Diet Prior to this Study: Regular;Thin liquids Temperature Spikes Noted: Yes (temp slightly elevated yesterday (10/2)) Respiratory Status: Supplemental O2 delivered via (comment) ([place] - 4 liters) History of Recent Intubation: No Behavior/Cognition: Cooperative;Pleasant mood;Lethargic/Drowsy;Requires cueing (requires cuing due to vision deficit at baseline) Oral Cavity - Dentition: Adequate natural dentition/normal for age Self-Feeding Abilities: Needs set up;Needs assist  Patient Positioning: Upright in bed Baseline Vocal Quality: Low vocal intensity Volitional Cough: Strong Volitional Swallow: Able to elicit    Oral/Motor/Sensory Function Overall Oral Motor/Sensory Function: Appears within functional limits for tasks assessed Labial ROM: Within Functional  Limits Labial Symmetry: Within Functional Limits Labial Strength:  (NT) Labial Sensation:  (NT) Lingual ROM:  (NT) Lingual Symmetry: Within Functional Limits Lingual Strength:  (NT) Lingual Sensation:  (NT) Facial ROM:  (NT) Facial Symmetry: Within Functional Limits Facial Strength:  (NT) Facial Sensation:  (NT) Velum:  (NT) Mandible: Within Functional Limits   Ice Chips Ice chips: Within functional limits Presentation: Spoon Other Comments: Pt took 3 ice chip trials by spoon with no immediate s/s of aspiration   Thin Liquid Thin Liquid: Impaired Presentation: Cup Pharyngeal  Phase Impairments: Cough - Immediate;Cough - Delayed;Throat Clearing - Delayed Other Comments: Pt took ~2-3 ounces of water by cup, first in small sips then multiple sips.  Pt had coughing episode after multiple sips and reported feeling it go down the wrong way and "tickle" in his throat.    Nectar Thick Nectar Thick Liquid: Not tested   Honey Thick Honey Thick Liquid: Within functional limits Presentation: Cup Other Comments: Pt tolerated nectar thick juice; consumed ~2 ounces by cup with no overt s/s of aspiration.   Puree Puree: Within functional limits Presentation: Spoon Other Comments: Pt took 6-8 tsp of applesauce with no overt s/s of aspiration noted.   Solid   GO    Solid: Within functional limits Presentation:  (fed by ST) Other Comments: Pt consumed 4 bites of graham cracker with ice cream, fed by ST, with no overt s/s of aspiration noted on these trials.       Tamakia Porto 04/10/2015,11:08 AM

## 2015-04-10 NOTE — Progress Notes (Signed)
Placed patient on Overnight Pulse Oximetry

## 2015-04-10 NOTE — Progress Notes (Signed)
MD [place] notified about pts HR in th 120-130's. Ordered received for Portable chest xray, STAT EKG, and 50 mg Hydralazine (see MAR). Will continue to monitor.

## 2015-04-11 LAB — TSH: TSH: 0.11 u[IU]/mL — AB (ref 0.350–4.500)

## 2015-04-11 LAB — CBC
HEMATOCRIT: 28.9 % — AB (ref 40.0–52.0)
HEMOGLOBIN: 9 g/dL — AB (ref 13.0–18.0)
MCH: 27.7 pg (ref 26.0–34.0)
MCHC: 31.1 g/dL — AB (ref 32.0–36.0)
MCV: 89.1 fL (ref 80.0–100.0)
Platelets: 166 10*3/uL (ref 150–440)
RBC: 3.25 MIL/uL — AB (ref 4.40–5.90)
RDW: 16.8 % — ABNORMAL HIGH (ref 11.5–14.5)
WBC: 8.6 10*3/uL (ref 3.8–10.6)

## 2015-04-11 LAB — T4, FREE: FREE T4: 1.09 ng/dL (ref 0.61–1.12)

## 2015-04-11 MED ORDER — LEVOFLOXACIN 500 MG PO TABS
750.0000 mg | ORAL_TABLET | Freq: Once | ORAL | Status: AC
  Administered 2015-04-11: 750 mg via ORAL
  Filled 2015-04-11: qty 1

## 2015-04-11 MED ORDER — AMOXICILLIN-POT CLAVULANATE 500-125 MG PO TABS
1.0000 | ORAL_TABLET | Freq: Two times a day (BID) | ORAL | Status: DC
  Administered 2015-04-11 – 2015-04-14 (×7): 500 mg via ORAL
  Filled 2015-04-11 (×7): qty 1

## 2015-04-11 MED ORDER — LEVOFLOXACIN 500 MG PO TABS: 500.0000 mg | ORAL_TABLET | ORAL | Status: DC

## 2015-04-11 MED ORDER — FUROSEMIDE 10 MG/ML IJ SOLN
80.0000 mg | Freq: Two times a day (BID) | INTRAMUSCULAR | Status: DC
  Administered 2015-04-11 – 2015-04-12 (×2): 80 mg via INTRAVENOUS
  Filled 2015-04-11 (×2): qty 8

## 2015-04-11 MED ORDER — DILTIAZEM HCL ER COATED BEADS 180 MG PO CP24
180.0000 mg | ORAL_CAPSULE | Freq: Every day | ORAL | Status: DC
  Administered 2015-04-11 – 2015-04-12 (×2): 180 mg via ORAL
  Filled 2015-04-11 (×2): qty 1

## 2015-04-11 NOTE — Progress Notes (Signed)
Spoke with Dr. Winona Legato about patient's continued need for off unit cardiac monitoring. Order received to discontinue off unit cardiac monitoring

## 2015-04-11 NOTE — Progress Notes (Signed)
Per MD patient is not medically stable for D/C today and may require Dialysis. Clinical Child psychotherapist (CSW) contacted [place] from [employer] and made her aware of above. CSW also made [facility] aware patient was put on Bi-pap last night and that patient's family wants long term care. Per Morrie Sheldon patient's family has been requesting long term care for a while however patient refuses. Deborah admissions coordinator at [facility] is aware of above. CSW will continue to follow and assist as needed.   Jetta Lout, LCSWA [phone]

## 2015-04-11 NOTE — Progress Notes (Signed)
Speech Language Pathology Treatment: Dysphagia  Patient Details Name: Sean Delgado. MRN: [id number] DOB: [birth year] Today's Date: 04/11/2015 Time: 9528-4132 SLP Time Calculation (min) (ACUTE ONLY): 42 min  Assessment / Plan / Recommendation Clinical Impression  Pt appears to be tolerating his current diet of Dys. 3 w/ Nectar consistency liquids w/ no overt s/s of aspiration noted; able to follow general aspiration precautions given min. Verbal cues(alternate food/liquids; small, single bites and sips). Pt does require full tray setup at meals d/t vision deficits and UE weakness; would benefit from a built-up utensil for more control during self-feeding. Pt appears a reduced risk following a modified diet w/ aspiration precautions. ST will f/u tomorrow w/ further trials of thin liquids to assess safety and toleration of such. NSG updated.    HPI Other Pertinent Information: Pt is more awake today and tolerating his po's w/ no immediate overt s/s of aspiration per NSG staff. Pt is requiring min tray setup assist sec. to vision deficits and overall weakness. Pt's respiratory status appears improved, and pt is not as lethargic w/ eyes closed in his presentation.    Pertinent Vitals Pain Assessment: No/denies pain  SLP Plan  Continue with current plan of care    Recommendations Diet recommendations: Dysphagia 3 (mechanical soft);Nectar-thick liquid Liquids provided via: Cup;Straw Medication Administration: Whole meds with puree Supervision: Patient able to self feed (w/ full tray setup and support w/ built-up utensil) Compensations: Minimize environmental distractions;Slow rate;Small sips/bites;Multiple dry swallows after each bite/sip;Follow solids with liquid Postural Changes and/or Swallow Maneuvers: Seated upright 90 degrees              General recommendations:  (OT consult for built-up utensil) Oral Care Recommendations: Oral care BID;Staff/trained caregiver to provide oral  care Follow up Recommendations: Skilled Nursing facility Plan: Continue with current plan of care    GO    Jerilynn Som, MS, CCC-SLP  Watson,Katherine 04/11/2015, 2:54 PM

## 2015-04-11 NOTE — Care Management Important Message (Signed)
Important Message  Patient Details  Name: Sean Delgado. MRN: [id number] Date of Birth: 1934-03-21   Medicare Important Message Given:  Yes-third notification given    Collie Siad, RN 04/11/2015, 9:50 AM

## 2015-04-11 NOTE — Consult Note (Signed)
[facility]  CONSULT NOTE    Date: 04/11/2015                  Patient Name:  Sean Delgado.  MRN: [id number]  DOB: 1933/07/14  Age / Sex: 81 y.o., male         PCP: Bobbye Morton, MD                 Service Requesting Consult: Dr. Winona Legato                 Reason for Consult: Chronic Kidney Disease stage V            History of Present Illness: [name]. is a 81 y.o. black male with right eye blindness, diastolic congestive heart failure, hypertension, gout, glaucoma, peripheral neuropathy, atrial fibrillation, anemia, carotid stenosis status post bilateral CEA, CVA, peripheral vascular disease who was admitted to [facility] on 04/08/2015 for shortness of breath.  Patient states that he stopped taking his furosemide at home. Admitted with right sided pneumonia. Placed on Bipap. Now switched to augmentin.  Patient recently had AVF fistula placed on left forearm by Dr. Wyn Quaker. 9/29.  He states he is feeling better today. Urine output not recorded.    Medications: Outpatient medications: Prescriptions prior to admission  Medication Sig Dispense Refill Last Dose  . acetaminophen (TYLENOL) 325 MG tablet Take 650 mg by mouth every 6 (six) hours as needed. For pain.   unknown  . allopurinol (ZYLOPRIM) 100 MG tablet Take 100 mg by mouth daily.   04/08/2015 at Unknown time  . amlodipine-atorvastatin (CADUET) 10-20 MG per tablet Take 1 tablet by mouth daily.   04/08/2015 at Unknown time  . aspirin 81 MG chewable tablet Chew 81 mg by mouth daily.   04/08/2015 at Unknown time  . brimonidine (ALPHAGAN) 0.2 % ophthalmic solution Place 1 drop into the left eye 2 (two) times daily.   04/08/2015 at Unknown time  . clopidogrel (PLAVIX) 75 MG tablet Take 75 mg by mouth daily.   04/08/2015 at Unknown time  . ferrous sulfate 325 (65 FE) MG EC tablet Take 325 mg by mouth daily.   04/08/2015 at Unknown time  . furosemide (LASIX) 20 MG tablet 3 tablets twice a day starting 03/12/2015  180 tablet 0 04/08/2015 at Unknown time  . hydrALAZINE (APRESOLINE) 25 MG tablet Take 75 mg by mouth 3 (three) times daily.   04/08/2015 at Unknown time  . HYDROcodone-acetaminophen (NORCO/VICODIN) 5-325 MG tablet Take 1 tablet by mouth every 6 (six) hours as needed for moderate pain or severe pain.   04/07/2015 at Unknown time  . isosorbide mononitrate (IMDUR) 60 MG 24 hr tablet Take 60 mg by mouth daily.   04/08/2015 at Unknown time  . latanoprost (XALATAN) 0.005 % ophthalmic solution Place 1 drop into both eyes at bedtime.   04/07/2015 at Unknown time  . metoprolol (TOPROL-XL) 200 MG 24 hr tablet Take 200 mg by mouth daily.   04/08/2015 at 1005  . miconazole (MICOTIN) 2 % cream Apply 1 application topically daily. Clean and apply to groin   unknown  . Multiple Vitamins-Minerals (CENTRUM SILVER PO) Take 1 tablet by mouth daily.   04/08/2015 at Unknown time  . pregabalin (LYRICA) 75 MG capsule Take 75 mg by mouth 2 (two) times daily.   04/08/2015 at Unknown time  . senna-docusate (SENOKOT-S) 8.6-50 MG per tablet Take 2 tablets by mouth at bedtime.   04/07/2015 at Unknown  time  . sodium bicarbonate 650 MG tablet Take 650 mg by mouth 2 (two) times daily.   04/08/2015 at Unknown time  . sodium polystyrene (KAYEXALATE) 15 GM/60ML suspension Take 60 mLs by mouth 2 (two) times a week. Take on Monday and Friday.   04/07/2015 at Unknown time    Current medications: Current Facility-Administered Medications  Medication Dose Route Frequency Provider Last Rate Last Dose  . 0.9 %  sodium chloride infusion  250 mL Intravenous PRN Gracelyn Nurse, MD      . acetaminophen (TYLENOL) tablet 650 mg  650 mg Oral Q6H PRN Gracelyn Nurse, MD      . albuterol (PROVENTIL) (2.5 MG/3ML) 0.083% nebulizer solution 5 mg  5 mg Nebulization Once Jennye Moccasin, MD   5 mg at 04/08/15 1628  . allopurinol (ZYLOPRIM) tablet 100 mg  100 mg Oral Daily Gracelyn Nurse, MD   100 mg at 04/11/15 1014  . amoxicillin-clavulanate (AUGMENTIN)  500-125 MG per tablet 500 mg  1 tablet Oral BID Katharina Caper, MD   500 mg at 04/11/15 1339  . aspirin EC tablet 81 mg  81 mg Oral Daily Gracelyn Nurse, MD   81 mg at 04/11/15 1014  . atorvastatin (LIPITOR) tablet 20 mg  20 mg Oral Daily Foye Deer, RPH   20 mg at 04/11/15 1015  . brimonidine (ALPHAGAN) 0.2 % ophthalmic solution 1 drop  1 drop Left Eye BID Gracelyn Nurse, MD   1 drop at 04/11/15 1021  . clopidogrel (PLAVIX) tablet 75 mg  75 mg Oral Daily Gracelyn Nurse, MD   75 mg at 04/11/15 1014  . diltiazem (CARDIZEM CD) 24 hr capsule 180 mg  180 mg Oral Daily Katharina Caper, MD   180 mg at 04/11/15 1341  . ferrous sulfate tablet 325 mg  325 mg Oral BID Gracelyn Nurse, MD   325 mg at 04/10/15 2208  . furosemide (LASIX) injection 80 mg  80 mg Intravenous BID Katharina Caper, MD      . heparin injection 5,000 Units  5,000 Units Subcutaneous 3 times per day Gracelyn Nurse, MD   5,000 Units at 04/11/15 1338  . HYDROcodone-acetaminophen (NORCO/VICODIN) 5-325 MG per tablet 1 tablet  1 tablet Oral Q6H PRN Katharina Caper, MD      . Influenza vac split quadrivalent PF (FLUARIX) injection 0.5 mL  0.5 mL Intramuscular Tomorrow-1000 Katharina Caper, MD      . isosorbide mononitrate (IMDUR) 24 hr tablet 60 mg  60 mg Oral Daily Gracelyn Nurse, MD   60 mg at 04/11/15 1014  . latanoprost (XALATAN) 0.005 % ophthalmic solution 1 drop  1 drop Both Eyes QHS Gracelyn Nurse, MD   1 drop at 04/10/15 2207  . metoprolol succinate (TOPROL-XL) 24 hr tablet 200 mg  200 mg Oral Daily Gracelyn Nurse, MD   200 mg at 04/11/15 1014  . multivitamin with minerals tablet   Oral Daily Gracelyn Nurse, MD   1 tablet at 04/11/15 1021  . pregabalin (LYRICA) capsule 75 mg  75 mg Oral BID Gracelyn Nurse, MD   75 mg at 04/11/15 1015  . senna-docusate (Senokot-S) tablet 2 tablet  2 tablet Oral QHS Gracelyn Nurse, MD   2 tablet at 04/10/15 2208  . sodium bicarbonate tablet 650 mg  650 mg Oral BID Gracelyn Nurse, MD   650 mg at  04/11/15 1014  . sodium chloride 0.9 % injection 3  mL  3 mL Intravenous Q12H Gracelyn Nurse, MD   3 mL at 04/11/15 1023  . sodium chloride 0.9 % injection 3 mL  3 mL Intravenous PRN Gracelyn Nurse, MD      . sodium polystyrene (KAYEXALATE) 15 GM/60ML suspension 15 g  15 g Oral Once per day on Mon Thu Gracelyn Nurse, MD   15 g at 04/10/15 1112  . tolnaftate (TINACTIN) 1 % powder 1 application  1 application Topical Daily Gracelyn Nurse, MD   1 application at 04/08/15 2329  . trolamine salicylate (ASPERCREME) 10 % cream 1 application  1 application Topical QID Gracelyn Nurse, MD   1 application at 04/11/15 1339      Allergies: No Known Allergies    Past Medical History: Past Medical History  Diagnosis Date  . Chronic renal failure, stage 4 (severe) (HCC)   . Hypertension   . CHF (congestive heart failure) (HCC)   . Gout   . Glaucoma   . Neuropathy (HCC)   . Stroke (HCC)   . Peripheral vascular disease (HCC)   . Coronary artery disease   . A-fib (HCC)   . Dysrhythmia   . Blindness of right eye   . Anemia     iron deficiency     Past Surgical History: Past Surgical History  Procedure Laterality Date  . Vascular surgery Bilateral     Carotid Endarterectomy  . Eye surgery Left     Cataract Extraction  . Av fistula placement Left 04/06/2015    Procedure: ARTERIOVENOUS (AV) FISTULA CREATION;  Surgeon: Annice Needy, MD;  Location: [facility] ORS;  Service: Vascular;  Laterality: Left;     Family History: Family History  Problem Relation Age of Onset  . Diabetes Mother      Social History: Social History   Social History  . Marital Status: Single    Spouse Name: N/A  . Number of Children: N/A  . Years of Education: N/A   Occupational History  . Not on file.   Social History Main Topics  . Smoking status: Former Smoker -- 0.25 packs/day for 15 years    Types: Cigarettes    Quit date: 09/06/2006  . Smokeless tobacco: Never Used  . Alcohol Use: No  . Drug Use: No   . Sexual Activity: Not on file   Other Topics Concern  . Not on file   Social History Narrative     Review of Systems: Review of Systems  Constitutional: Negative.   HENT: Negative.   Eyes: Positive for blurred vision. Negative for double vision, photophobia, pain, discharge and redness.  Respiratory: Positive for cough, sputum production, shortness of breath and wheezing. Negative for hemoptysis.   Cardiovascular: Positive for chest pain, palpitations, orthopnea, leg swelling and PND. Negative for claudication.  Gastrointestinal: Negative.  Negative for heartburn, nausea, vomiting, abdominal pain, diarrhea, constipation, blood in stool and melena.  Genitourinary: Negative.  Negative for dysuria, urgency, frequency, hematuria and flank pain.  Musculoskeletal: Negative.  Negative for myalgias, back pain, joint pain, falls and neck pain.  Skin: Negative.   Neurological: Negative for dizziness, tingling, tremors, sensory change, speech change, focal weakness, seizures and loss of consciousness.  Endo/Heme/Allergies: Negative for environmental allergies and polydipsia. Does not bruise/bleed easily.  Psychiatric/Behavioral: Negative for depression, suicidal ideas, hallucinations, memory loss and substance abuse. The patient is not nervous/anxious and does not have insomnia.     Vital Signs: Blood pressure 139/87, pulse 113, temperature 98.6 F (37  C), temperature source Oral, resp. rate 20, height  (1.854 m), weight 116.212 kg (256 lb 3.2 oz), SpO2 95 %.  Weight trends: Filed Weights   04/08/15 1540 04/08/15 2034  Weight: 116.574 kg (257 lb) 116.212 kg (256 lb 3.2 oz)    Physical Exam: General: NAD  Head: Normocephalic, atraumatic. Moist oral mucosal membranes  Eyes: Anicteric, PERRL  Neck: Supple, trachea midline  Lungs:  Clear to auscultation, breathing room air  Heart: Regular rate and rhythm  Abdomen:  Soft, nontender,   Extremities:  trace to 1+ peripheral edema.   Neurologic: Nonfocal, moving all four extremities  Skin: No lesions  Access: Maturing left forearm AVF     Lab results: Basic Metabolic Panel:  Recent Labs Lab 04/08/15 1605 04/09/15 0400 04/10/15 1314  NA 144 147* 142  K 5.3* 4.6 4.6  CL 111 112* 107  CO2 GLUCOSE 129* 102* 141*  BUN 70* 70* 75*  CREATININE 4.08* 3.91* 4.63*  CALCIUM 7.8* 8.1* 8.1*    Liver Function Tests:  Recent Labs Lab 04/08/15 1605  AST 32  ALT 24  ALKPHOS 87  BILITOT 0.7  PROT 6.9  ALBUMIN 3.0*   No results for input(s): LIPASE, AMYLASE in the last 168 hours. No results for input(s): AMMONIA in the last 168 hours.  CBC:  Recent Labs Lab 04/08/15 1604 04/11/15 0604  WBC 7.7 8.6  NEUTROABS 6.0  --   HGB 9.0* 9.0*  HCT 28.6* 28.9*  MCV 89.2 89.1  PLT 159 166    Cardiac Enzymes:  Recent Labs Lab 04/08/15 1604  TROPONINI 0.03    BNP: Invalid input(s): POCBNP  CBG: No results for input(s): GLUCAP in the last 168 hours.  Microbiology: Results for orders placed or performed during the hospital encounter of 04/08/15  MRSA PCR Screening     Status: None   Collection Time: 04/09/15  2:27 AM  Result Value Ref Range Status   MRSA by PCR NEGATIVE NEGATIVE Final    Comment:        The GeneXpert MRSA Assay (FDA approved for NASAL specimens only), is one component of a comprehensive MRSA colonization surveillance program. It is not intended to diagnose MRSA infection nor to guide or monitor treatment for MRSA infections.     Coagulation Studies: No results for input(s): LABPROT, INR in the last 72 hours.  Urinalysis: No results for input(s): COLORURINE, LABSPEC, PHURINE, GLUCOSEU, HGBUR, BILIRUBINUR, KETONESUR, PROTEINUR, UROBILINOGEN, NITRITE, LEUKOCYTESUR in the last 72 hours.  Invalid input(s): APPERANCEUR    Imaging: Dg Chest Port 1 View  04/10/2015   CLINICAL DATA:  Tachycardia. History of congestive heart failure, renal failure and hypertension.  Initial encounter.  EXAM: PORTABLE CHEST 1 VIEW  COMPARISON:  04/08/2015 and 03/06/2015 radiographs.  FINDINGS: 1801 hours. The heart is enlarged. The patient is rotated to the left. There is increasing vascular congestion and basilar pulmonary opacities and an asymmetric right perihilar component suspicious for edema. There may be a small amount of pleural fluid bilaterally. No evidence of pneumothorax. The bones appear unchanged.  IMPRESSION: Worsening bilateral airspace opacities with probable pleural effusions. Findings are most consistent with congestive heart failure.   Electronically Signed   By: Carey Bullocks M.D.   On: 04/10/2015 19:27      Assessment & Plan: [name]. is a 81 y.o. black male with right eye blindness, diastolic congestive heart failure, hypertension, gout, glaucoma, peripheral neuropathy, atrial fibrillation, anemia, carotid stenosis status post bilateral  CEA, CVA, peripheral vascular disease who was admitted to [facility] on 04/08/2015 for shortness of breath. Alt mental status   Follows with [facility]  1. Chronic Kidney Disease stage V: patient's volume status and respiratory status has improved with IV furosemide. Creatinine is elevated and patient may benefit from starting hemodialysis soon. However no acute indication for starting hemodialysis during this admission. Patient states he is not ready for dialysis currently.  Kayexalate given this admission, potassiums have been acceptable.  - Agree with monitoring closely for dialysis need. Monitor volume status and renal function.  - Electrolytes stable.  - Renally dose all medication - holding ACE-I/ARB due to advanced renal disease  2. Hypertension: well controlled blood pressures. Now switched to PO furosemide.  - home regimen of imdur, amlodipine, hydralazine, metoprolol, and fursoemide.   3. Anemia of chronic kidney disease: hemoglobin of 9. Consider ESA as an outpatient.  - iron supplement  4.  Secondary Hyperparathyroidism: no PTH or phos available. Calcium at goal.   5. Metabolic Acidosis: carbon dioxide at goal. Continue buffer with sodium bicarbonate.      LOS: 3 Adriannah Steinkamp 10/4/20162:18 PM

## 2015-04-11 NOTE — Progress Notes (Addendum)
[facility] [place] [facility]   PATIENT NAME: Sean Delgado    MR#:  [id number]  DATE OF BIRTH:  [birth year]  SUBJECTIVE:  CHIEF COMPLAINT:   Chief Complaint  Patient presents with  . Shortness of Breath  the patient is a 81 year old African-American male with past medical history significant for history of CAD, stroke, peripheral vascular disease, coronary artery disease, multiple other medical problems who presents to the hospital with complaints of acute shortness of breath, nonproductive cough. On arrival to emergency room, he was noted to have right upper lobe pneumonia and admitted to the hospital for further evaluation and treatment. Patient was initiated on Zosyn. However, he got fluid overloaded and his chest x-ray revealed worsening congestive heart failure . His oxygen saturations where low at 89% on oxygen therapy through nasal cannula yesterday. He was somnolent.  ABGs performed on oxygen therapy revealed a pH of 7.33, PCO2 52, patient was initiated on BiPAP, but now he is weaned down again to 4 L of oxygen through nasal cannula. He feels quite good today. Creatinine is 4.63 today. However, it disease, baseline according to [facility] note in April 2016 . Patient developed A. fib, RVR last night for which she was given a dose of IV metoprolol and started on Cardizem orally. Remains in atrial fibrillation at rate of 110 to 120s is still on hydralazine orally.  Review of Systems  Constitutional: Negative for fever, chills and weight loss.  HENT: Negative for congestion.   Eyes: Negative for blurred vision and double vision.  Respiratory: Positive for cough and shortness of breath. Negative for sputum production and wheezing.   Cardiovascular: Negative for chest pain, palpitations, orthopnea, leg swelling and PND.  Gastrointestinal: Negative for nausea, vomiting, abdominal pain, diarrhea, constipation and blood in stool.  Genitourinary: Negative for dysuria,  urgency, frequency and hematuria.  Musculoskeletal: Negative for falls.  Neurological: Negative for dizziness, tremors, focal weakness and headaches.  Endo/Heme/Allergies: Does not bruise/bleed easily.  Psychiatric/Behavioral: Negative for depression. The patient does not have insomnia.     VITAL SIGNS: Blood pressure 147/68, pulse 116, temperature 98.6 F (37 C), temperature source Oral, resp. rate 20, height  (1.854 m), weight 116.212 kg (256 lb 3.2 oz), SpO2 95 %.  PHYSICAL EXAMINATION:   GENERAL:  81 y.o.-year-old patient sitting in the bed with no acute distress. More alert and comfortable , communicates  well  EYES: Pupils equal, round, reactive to light and accommodation. No scleral icterus. Extraocular muscles intact.  HEENT: Head atraumatic, normocephalic. Oropharynx and nasopharynx clear. Slurring speech NECK:  Supple, no jugular venous distention. No thyroid enlargement, no tenderness.  LUNGS: Some diminished breath sounds bilaterally at bases, no wheezing, rales,rhonchi or crepitation. No use of accessory muscles of respiration.  CARDIOVASCULAR: S1, S2 normal. No murmurs, rubs, or gallops.  ABDOMEN: Soft, nontender, nondistended. Bowel sounds present. No organomegaly or mass.  EXTREMITIES: No pedal edema, cyanosis, or clubbing.  NEUROLOGIC: Cranial nerves II through XII are intact. Muscle strength is 5 out of 5 in all 4 extremities Sensation grossly intact. Gait not checked.   PSYCHIATRIC: The patient is much more alert and oriented  SKIN: No obvious rash, lesion, or ulcer.   ORDERS/RESULTS REVIEWED:   CBC  Recent Labs Lab 04/08/15 1604 04/11/15 0604  WBC 7.7 8.6  HGB 9.0* 9.0*  HCT 28.6* 28.9*  PLT 159 166  MCV 89.2 89.1  MCH 28.0 27.7  MCHC 31.4* 31.1*  RDW 17.0* 16.8*  LYMPHSABS 0.6*  --  MONOABS 0.7  --   EOSABS 0.3  --   BASOSABS 0.1  --     ------------------------------------------------------------------------------------------------------------------  Chemistries   Recent Labs Lab 04/08/15 1605 04/09/15 0400 04/10/15 1314  NA 144 147* 142  K 5.3* 4.6 4.6  CL 111 112* 107  CO2 22 25 25   GLUCOSE 129* 102* 141*  BUN 70* 70* 75*  CREATININE 4.08* 3.91* 4.63*  CALCIUM 7.8* 8.1* 8.1*  AST 32  --   --   ALT 24  --   --   ALKPHOS 87  --   --   BILITOT 0.7  --   --    ------------------------------------------------------------------------------------------------------------------ estimated creatinine clearance is 17.6 mL/min (by C-G formula based on Cr of 4.63). ------------------------------------------------------------------------------------------------------------------ No results for input(s): TSH, T4TOTAL, T3FREE, THYROIDAB in the last 72 hours.  Invalid input(s): FREET3  Cardiac Enzymes  Recent Labs Lab 04/08/15 1604  TROPONINI 0.03   ------------------------------------------------------------------------------------------------------------------ Invalid input(s): POCBNP ---------------------------------------------------------------------------------------------------------------  RADIOLOGY: Dg Chest Port 1 View  04/10/2015   CLINICAL DATA:  Tachycardia. History of congestive heart failure, renal failure and hypertension. Initial encounter.  EXAM: PORTABLE CHEST 1 VIEW  COMPARISON:  04/08/2015 and 03/06/2015 radiographs.  FINDINGS: 1801 hours. The heart is enlarged. The patient is rotated to the left. There is increasing vascular congestion and basilar pulmonary opacities and an asymmetric right perihilar component suspicious for edema. There may be a small amount of pleural fluid bilaterally. No evidence of pneumothorax. The bones appear unchanged.  IMPRESSION: Worsening bilateral airspace opacities with probable pleural effusions. Findings are most consistent with congestive heart failure.    Electronically Signed   By: Carey Bullocks M.D.   On: 04/10/2015 19:27    EKG:  Orders placed or performed during the hospital encounter of 04/08/15  . EKG 12-Lead  . EKG 12-Lead  . ED EKG  . ED EKG  . EKG 12-Lead  . EKG 12-Lead    ASSESSMENT AND PLAN:  Active Problems:   Acute respiratory failure (HCC) 1. Acute respiratory failure with hypoxia and hypercapnia due to pneumonia and acute on chronic diastolic congestive heart failure,  continue oxygen therapy, keeping pulse oximeter at around 94% and above, also BiPAP intermittently. He would benefit from sleep study as outpatient to rule out obstructive sleep apnea and need for nocturnal CPAP. [name] most recent chest x-ray revealed worsening CHF, advancing Lasix, following urinary output getting nephrologist involved for further recommendations as patient is being prepped for possible hemodialysis in the nearest future with recent AV fistula placement in left upper extremity.  2. Acute on chronic diastolic congestive heart failure as above, continue Lasix intravenously following in's and outs, getting nephrologist involved to follow with [place] and  initiate hemodialysis if needed. Echocardiogram done in August 2016 revealed normal ejection fraction but moderate MR.  3. Right upper lobe bacterial pneumonia of unclear bacteriologic agent, discontinue Zosyn. Initiate patient on levofloxacin orally , get sputum cultures if possible  4. Dysphagia, questionable aspiration pneumonitis. Getting speech therapist involved for further recommendations. Continue dysphagia 3 with honey thick liquids for now  5. Hyperkalemia, resolved with therapy 6. Anemia. Get Hemoccult 7. Generalized weakness , getting physical therapist involved for further recommendations. Likely skilled nursing facility placement, family once the patient to be placed, but patient is refusing discussed with care management 8. A. fib, RVR, discontinue hydralazine. Initiate patient on  long-acting Cardizem CD, continue metoprolol, advance medications as needed, get cardiologist recommendations, echocardiogram in August 2016 unremarkable except of MR  Management plans discussed  with the patient, family and they are in agreement.   DRUG ALLERGIES: No Known Allergies  CODE STATUS:     Code Status Orders        Start     Ordered   04/08/15 2032  Full code   Continuous     04/08/15 2031      TOTAL TIME TAKING CARE OF THIS PATIENT: .  Discussed with Dr. Wynelle Link today for 10 minutes  Akirah Storck M.D on 04/11/2015 at 11:30 AM  Between 7am to 6pm - Pager - [phone]  After 6pm go to [email or web address] - password [email or web address] [facility]  [facility] [facility]  Office  [phone]  CC: Primary care physician; Bobbye Morton, MD

## 2015-04-11 NOTE — Consult Note (Signed)
[facility] Cardiology Consultation Note  Patient ID: Sean Delgado., MRN: [id number], DOB/AGE: [birth year] 81 y.o. Admit date: 04/08/2015   Date of Consult: 04/11/2015 Primary Physician: Sean Morton, MD Primary Cardiologist: None  Chief Complaint:  hpi Own coronary artery disease with no evidence of significant myocardial infarction at this time having an acute shortness of breath and respiratory failure lik at appropriately all the time andely secondary to recent pneumonia. The patient does have sleep apnea on CPAP machine for which apparently is using Reason for Consult: acute on chronic diastolic dysfunction heart failure with known coronary artery disease peripheral vascular disease sleep apnea on CPAP machine with acute onset of nonvalvular atrial fibrillation with rapid ventricular rate. The patient was placed on appropriate medication management including metoprolol and diltiazem with better heart rate control at this time. It does appear that the atrial fibrillation was exacerbated by sitting pneumonia and hypoxia. Lasix has been given as well as treatment with dialysis for some diastolic dysfunction heart failure which has slowly improved as well. Has had coronary artery disease for which has been on high intensity cholesterol therapy and tolerating fairly well. Has been no evidence of true angina at this time and/or myocardial infarction with a normal troponin. Claudie Delgado has had appropriate treatment of pneumonia with antibiotics with some improvements of with shin and pulmonary toilet    Past Medical History  Diagnosis Date  . Chronic renal failure, stage 4 (severe) (HCC)   . Hypertension   . CHF (congestive heart failure) (HCC)   . Gout   . Glaucoma   . Neuropathy (HCC)   . Stroke (HCC)   . Peripheral vascular disease (HCC)   . Coronary artery disease   . A-fib (HCC)   . Dysrhythmia   . Blindness of right eye   . Anemia     iron deficiency      Surgical History:  Past  Surgical History  Procedure Laterality Date  . Vascular surgery Bilateral     Carotid Endarterectomy  . Eye surgery Left     Cataract Extraction  . Av fistula placement Left 04/06/2015    Procedure: ARTERIOVENOUS (AV) FISTULA CREATION;  Surgeon: Sean Needy, MD;  Location: [facility] ORS;  Service: Vascular;  Laterality: Left;     Home Meds: Prior to Admission medications   Medication Sig Start Date End Date Taking? Authorizing Provider  acetaminophen (TYLENOL) 325 MG tablet Take 650 mg by mouth every 6 (six) hours as needed. For pain.   Yes Historical Provider, MD  allopurinol (ZYLOPRIM) 100 MG tablet Take 100 mg by mouth daily.   Yes Historical Provider, MD  amlodipine-atorvastatin (CADUET) 10-20 MG per tablet Take 1 tablet by mouth daily.   Yes Historical Provider, MD  aspirin 81 MG chewable tablet Chew 81 mg by mouth daily.   Yes Historical Provider, MD  brimonidine (ALPHAGAN) 0.2 % ophthalmic solution Place 1 drop into the left eye 2 (two) times daily.   Yes Historical Provider, MD  clopidogrel (PLAVIX) 75 MG tablet Take 75 mg by mouth daily.   Yes Historical Provider, MD  ferrous sulfate 325 (65 FE) MG EC tablet Take 325 mg by mouth daily.   Yes Historical Provider, MD  furosemide (LASIX) 20 MG tablet 3 tablets twice a day starting 03/12/2015 03/11/15  Yes Sean Renae Gloss, MD  hydrALAZINE (APRESOLINE) 25 MG tablet Take 75 mg by mouth 3 (three) times daily.   Yes Historical Provider, MD  HYDROcodone-acetaminophen (NORCO/VICODIN) 5-325 MG tablet Take 1  tablet by mouth every 6 (six) hours as needed for moderate pain or severe pain.   Yes Historical Provider, MD  isosorbide mononitrate (IMDUR) 60 MG 24 hr tablet Take 60 mg by mouth daily.   Yes Historical Provider, MD  latanoprost (XALATAN) 0.005 % ophthalmic solution Place 1 drop into both eyes at bedtime.   Yes Historical Provider, MD  metoprolol (TOPROL-XL) 200 MG 24 hr tablet Take 200 mg by mouth daily.   Yes Historical Provider, MD  miconazole  (MICOTIN) 2 % cream Apply 1 application topically daily. Clean and apply to groin   Yes Historical Provider, MD  Multiple Vitamins-Minerals (CENTRUM SILVER PO) Take 1 tablet by mouth daily.   Yes Historical Provider, MD  pregabalin (LYRICA) 75 MG capsule Take 75 mg by mouth 2 (two) times daily.   Yes Historical Provider, MD  senna-docusate (SENOKOT-S) 8.6-50 MG per tablet Take 2 tablets by mouth at bedtime.   Yes Historical Provider, MD  sodium bicarbonate 650 MG tablet Take 650 mg by mouth 2 (two) times daily.   Yes Historical Provider, MD  sodium polystyrene (KAYEXALATE) 15 GM/60ML suspension Take 60 mLs by mouth 2 (two) times a week. Take on Monday and Friday. 04/25/14  Yes Historical Provider, MD    Inpatient Medications:  . albuterol  5 mg Nebulization Once  . allopurinol  100 mg Oral Daily  . amoxicillin-clavulanate  1 tablet Oral BID  . aspirin EC  81 mg Oral Daily  . atorvastatin  20 mg Oral Daily  . brimonidine  1 drop Left Eye BID  . clopidogrel  75 mg Oral Daily  . diltiazem  180 mg Oral Daily  . ferrous sulfate  325 mg Oral BID  . furosemide  80 mg Intravenous BID  . heparin  5,000 Units Subcutaneous 3 times per day  . Influenza vac split quadrivalent PF  0.5 mL Intramuscular Tomorrow-1000  . isosorbide mononitrate  60 mg Oral Daily  . latanoprost  1 drop Both Eyes QHS  . metoprolol  200 mg Oral Daily  . multivitamin with minerals   Oral Daily  . pregabalin  75 mg Oral BID  . senna-docusate  2 tablet Oral QHS  . sodium bicarbonate  650 mg Oral BID  . sodium chloride  3 mL Intravenous Q12H  . sodium polystyrene  15 g Oral Once per day on Mon Thu  . tolnaftate  1 application Topical Daily  . trolamine salicylate  1 application Topical QID      Allergies: No Known Allergies  Social History   Social History  . Marital Status: Single    Spouse Name: N/A  . Number of Children: N/A  . Years of Education: N/A   Occupational History  . Not on file.   Social History  Main Topics  . Smoking status: Former Smoker -- 0.25 packs/day for 15 years    Types: Cigarettes    Quit date: 09/06/2006  . Smokeless tobacco: Never Used  . Alcohol Use: No  . Drug Use: No  . Sexual Activity: Not on file   Other Topics Concern  . Not on file   Social History Narrative     Family History  Problem Relation Age of Onset  . Diabetes Mother      Review of Systems Positive forshortness of breath weaknesstive for: General:  chills, fever, night sweats or weight changes.  Cardiovascular: PND orthopnea syncope dizziness  Dermatological skin lesions rashes Respiratory:   Urologic: Frequent urination urination at night  and hematuria Abdominal: negative for nausea, vomiting, diarrhea, bright red blood per rectum, melena, or hematemesis Neurologic: negative for visual changes, and/or hearing changes  All other systems reviewed and are otherwise negative except as noted above.  Labs:  Recent Labs  04/08/15 1604  TROPONINI 0.03   Lab Results  Component Value Date   WBC 8.6 04/11/2015   HGB 9.0* 04/11/2015   HCT 28.9* 04/11/2015   MCV 89.1 04/11/2015   PLT 166 04/11/2015    Recent Labs Lab 04/08/15 1605  04/10/15 1314  NA 144  < > 142  K 5.3*  < > 4.6  CL 111  < > 107  CO2 22  < > 25  BUN 70*  < > 75*  CREATININE 4.08*  < > 4.63*  CALCIUM 7.8*  < > 8.1*  PROT 6.9  --   --   BILITOT 0.7  --   --   ALKPHOS 87  --   --   ALT 24  --   --   AST 32  --   --   GLUCOSE 129*  < > 141*  < > = values in this interval not displayed. No results found for: CHOL, HDL, LDLCALC, TRIG No results found for: DDIMER  Radiology/Studies:  Dg Chest Port 1 View  04/10/2015   CLINICAL DATA:  Tachycardia. History of congestive heart failure, renal failure and hypertension. Initial encounter.  EXAM: PORTABLE CHEST 1 VIEW  COMPARISON:  04/08/2015 and 03/06/2015 radiographs.  FINDINGS: 1801 hours. The heart is enlarged. The patient is rotated to the left. There is increasing  vascular congestion and basilar pulmonary opacities and an asymmetric right perihilar component suspicious for edema. There may be a small amount of pleural fluid bilaterally. No evidence of pneumothorax. The bones appear unchanged.  IMPRESSION: Worsening bilateral airspace opacities with probable pleural effusions. Findings are most consistent with congestive heart failure.   Electronically Signed   By: Carey Bullocks M.D.   On: 04/10/2015 19:27   Dg Chest Port 1 View  04/08/2015   CLINICAL DATA:  Shortness of breath and decreased oxygen saturation  EXAM: PORTABLE CHEST 1 VIEW  COMPARISON:  March 06, 2015  FINDINGS: There is focal airspace opacity in the right upper lobe, concerning for pneumonia. There is generalized interstitial prominence with cardiomegaly suggesting a degree of underlying chronic congestive heart failure. No adenopathy. The pulmonary vascularity is within normal limits.  IMPRESSION: Right upper lobe pneumonia. Suspect a degree of underlying chronic congestive heart failure.   Electronically Signed   By: Bretta Bang III M.D.   On: 04/08/2015 16:09    EKG:  atrial fibrillation with rapid ventricular rate and nonspecific ST and T-wave changes Weights: Filed Weights   04/08/15 1540 04/08/15 2034  Weight: 257 lb (116.574 kg) 256 lb 3.2 oz (116.212 kg)     Physical Exam: Blood pressure 147/68, pulse 116, temperature 98.6 F (37 C), temperature source Oral, resp. rate 20, height 6\' 1"  (1.854 m), weight 256 lb 3.2 oz (116.212 kg), SpO2 95 %. Body mass index is 33.81 kg/(m^2). General: Well developed, well nourished, in no acute distress. Head eyes ears nose throat: Normocephalic, atraumatic, sclera non-icteric, no xanthomas, nares are without discharge. No apparent thyromegaly and/or mass  Lungs: Normal respiratory effort.  no wheezes basilarles, no rhonchi.  Heart: RRR with normal S1 S2. no murmur gallop, no rub, PMI is normal size and placement, carotid upstroke normal  without bruit, jugular venous pressure is normal Abdomen: Soft, non-tender,  non-distended with normoactive bowel sounds. No hepatomegaly. No rebound/guarding. No obvious abdominal masses. Abdominal aorta is normal size without bruit Extremit 2+ edema. no cyanosis, no clubbing,  positiveers  Peripheral : 1eral upper extremity pulses, 1+ bilateral femoral pulses, 2+ bilateral dorsal pedal pulse Neuro: Alert and oriented. No facial asymmetry. No focal deficit. Moves all extremities spontaneously. Musculoskeletal: Normal muscle tone without kyphosis Psych:  Responds to questions appropriately with a normal affect.    81 year old male with known chronic kidney disease peripheral vascular disease acute on chronic diastolic dysfunction congestive heart failure likely secondary to pneumonia causing atrial fibrillation with rapid ventricular rate now slightly improved at this time with appropriate medication management and no current evidence of myocardial infarction  1. Continue diltiazem and metoprolol for heart rate control of atrial fibrillation 2. Possible anticoagulation if able following closely for any significant anemia and bleeding complications for further risk reduction in stroke with atrial fibrillation 3. Dialysis and/or Lasix for issues with the lower extremity edema pulmonary edema and diastolic dysfunction heart failure 4. CPAP with oxygenation to improve above and help with oxygenation with pneumonia 5. Consider echo LV systolic dysfunction valvular heart disease if not done recently  6. Begin ambulation and help with further treatments after above  Signed, Lamar Blinks M.D. [facility] Cardiology 04/11/2015, 1:39 PM

## 2015-04-11 NOTE — Progress Notes (Signed)
No BIPAP at bedside, patients respiratory status improved. Per report Leonie Man removed BIPAP from bedside.

## 2015-04-12 ENCOUNTER — Inpatient Hospital Stay: Payer: Medicare (Managed Care)

## 2015-04-12 ENCOUNTER — Encounter
Admission: EM | Disposition: A | Discharge status: Skilled Nursing Facility | Payer: Self-pay | Source: Home / Self Care | Attending: Internal Medicine

## 2015-04-12 ENCOUNTER — Encounter: Payer: Self-pay | Admitting: *Deleted

## 2015-04-12 HISTORY — PX: PERIPHERAL VASCULAR CATHETERIZATION: SHX172C

## 2015-04-12 LAB — BASIC METABOLIC PANEL
ANION GAP: 6 (ref 5–15)
BUN: 73 mg/dL — AB (ref 6–20)
CALCIUM: 8.4 mg/dL — AB (ref 8.9–10.3)
CO2: 30 mmol/L (ref 22–32)
Chloride: 115 mmol/L — ABNORMAL HIGH (ref 101–111)
Creatinine, Ser: 4.54 mg/dL — ABNORMAL HIGH (ref 0.61–1.24)
GFR calc Af Amer: 13 mL/min — ABNORMAL LOW (ref >60)
GFR, EST NON AFRICAN AMERICAN: 11 mL/min — AB (ref >60)
Glucose, Bld: 166 mg/dL — ABNORMAL HIGH (ref 65–99)
POTASSIUM: 4.7 mmol/L (ref 3.5–5.1)
SODIUM: 151 mmol/L — AB (ref 135–145)

## 2015-04-12 SURGERY — DIALYSIS/PERMA CATHETER INSERTION
Anesthesia: Moderate Sedation

## 2015-04-12 MED ORDER — METOPROLOL SUCCINATE ER 100 MG PO TB24
100.0000 mg | ORAL_TABLET | Freq: Three times a day (TID) | ORAL | Status: DC
  Administered 2015-04-12 – 2015-04-17 (×10): 100 mg via ORAL
  Filled 2015-04-12 (×11): qty 1

## 2015-04-12 MED ORDER — MIDAZOLAM HCL 2 MG/2ML IJ SOLN
INTRAMUSCULAR | Status: DC | PRN
  Administered 2015-04-12: 1 mg via INTRAVENOUS

## 2015-04-12 MED ORDER — LIDOCAINE-EPINEPHRINE (PF) 1 %-1:200000 IJ SOLN
INTRAMUSCULAR | Status: AC
  Filled 2015-04-12: qty 30

## 2015-04-12 MED ORDER — HEPARIN (PORCINE) IN NACL 2-0.9 UNIT/ML-% IJ SOLN
INTRAMUSCULAR | Status: AC
  Filled 2015-04-12: qty 500

## 2015-04-12 MED ORDER — HEPARIN SODIUM (PORCINE) 10000 UNIT/ML IJ SOLN
INTRAMUSCULAR | Status: AC
  Filled 2015-04-12: qty 1

## 2015-04-12 MED ORDER — DILTIAZEM HCL ER COATED BEADS 180 MG PO CP24: 180.0000 mg | ORAL_CAPSULE | Freq: Two times a day (BID) | ORAL | Status: DC

## 2015-04-12 MED ORDER — FENTANYL CITRATE (PF) 100 MCG/2ML IJ SOLN
INTRAMUSCULAR | Status: AC
  Filled 2015-04-12: qty 2

## 2015-04-12 MED ORDER — DILTIAZEM HCL ER COATED BEADS 180 MG PO CP24
180.0000 mg | ORAL_CAPSULE | Freq: Every day | ORAL | Status: DC
  Administered 2015-04-14 – 2015-04-17 (×3): 180 mg via ORAL
  Filled 2015-04-12 (×3): qty 1

## 2015-04-12 MED ORDER — FUROSEMIDE 40 MG PO TABS
40.0000 mg | ORAL_TABLET | Freq: Two times a day (BID) | ORAL | Status: DC
  Administered 2015-04-12 – 2015-04-17 (×9): 40 mg via ORAL
  Filled 2015-04-12 (×9): qty 1

## 2015-04-12 MED ORDER — SODIUM CHLORIDE 0.9 % IV SOLN: INTRAVENOUS | Status: DC

## 2015-04-12 MED ORDER — MIDAZOLAM HCL 2 MG/2ML IJ SOLN
INTRAMUSCULAR | Status: AC
  Filled 2015-04-12: qty 2

## 2015-04-12 SURGICAL SUPPLY — 6 items
CATH PALINDROME RT-P 15FX23CM (CATHETERS) ×3 IMPLANT
PACK ANGIOGRAPHY (CUSTOM PROCEDURE TRAY) ×3 IMPLANT
SUT MNCRL 4-0 (SUTURE) ×2
SUT MNCRL 4-0 27XMFL (SUTURE) ×1
SUT PROLENE 0 CT 1 30 (SUTURE) ×3 IMPLANT
SUTURE MNCRL 4-0 27XMF (SUTURE) ×1 IMPLANT

## 2015-04-12 NOTE — Progress Notes (Addendum)
[facility]  ROUNDING NOTE   Subjective:   Sister and niece at bedside.  Patient states he is doing well.   Objective:  Vital signs in last 24 hours:  Temp:  [98.2 F (36.8 C)-98.5 F (36.9 C)] 98.4 F (36.9 C) (10/05 0754) Pulse Rate:  [113-126] 126 (10/05 0754) Resp:  [20-21] 20 (10/05 0754) BP: (116-153)/(55-87) 116/55 mmHg (10/05 0754) SpO2:  [94 %-97 %] 95 % (10/05 0754)  Weight change:  Filed Weights   04/08/15 1540 04/08/15 2034  Weight: 116.574 kg (257 lb) 116.212 kg (256 lb 3.2 oz)    Intake/Output: I/O last 3 completed shifts: In: 603 [P.O.:600; I.V.:3] Out: -    Intake/Output this shift:     Physical Exam: General: NAD  Head: Normocephalic, atraumatic. Moist oral mucosal membranes  Eyes: Anicteric, right eye nonreactive  Neck: Supple, trachea midline  Lungs:  Clear to auscultation  Heart: Regular rate and rhythm  Abdomen:  Soft, nontender  Extremities: 1+ peripheral edema.  Neurologic: Nonfocal, moving all four extremities  Skin: No lesions  Access: Maturing left forearm AVF    Basic Metabolic Panel:  Recent Labs Lab 04/08/15 1605 04/09/15 0400 04/10/15 1314 04/12/15 1046  NA 144 147* 142 151*  K 5.3* 4.6 4.6 4.7  CL 111 112* 107 115*  CO2 GLUCOSE 129* 102* 141* 166*  BUN 70* 70* 75* 73*  CREATININE 4.08* 3.91* 4.63* 4.54*  CALCIUM 7.8* 8.1* 8.1* 8.4*    Liver Function Tests:  Recent Labs Lab 04/08/15 1605  AST 32  ALT 24  ALKPHOS 87  BILITOT 0.7  PROT 6.9  ALBUMIN 3.0*   No results for input(s): LIPASE, AMYLASE in the last 168 hours. No results for input(s): AMMONIA in the last 168 hours.  CBC:  Recent Labs Lab 04/08/15 1604 04/11/15 0604  WBC 7.7 8.6  NEUTROABS 6.0  --   HGB 9.0* 9.0*  HCT 28.6* 28.9*  MCV 89.2 89.1  PLT 159 166    Cardiac Enzymes:  Recent Labs Lab 04/08/15 1604  TROPONINI 0.03    BNP: Invalid input(s): POCBNP  CBG: No results for input(s): GLUCAP in the  last 168 hours.  Microbiology: Results for orders placed or performed during the hospital encounter of 04/08/15  MRSA PCR Screening     Status: None   Collection Time: 04/09/15  2:27 AM  Result Value Ref Range Status   MRSA by PCR NEGATIVE NEGATIVE Final    Comment:        The GeneXpert MRSA Assay (FDA approved for NASAL specimens only), is one component of a comprehensive MRSA colonization surveillance program. It is not intended to diagnose MRSA infection nor to guide or monitor treatment for MRSA infections.     Coagulation Studies: No results for input(s): LABPROT, INR in the last 72 hours.  Urinalysis: No results for input(s): COLORURINE, LABSPEC, PHURINE, GLUCOSEU, HGBUR, BILIRUBINUR, KETONESUR, PROTEINUR, UROBILINOGEN, NITRITE, LEUKOCYTESUR in the last 72 hours.  Invalid input(s): APPERANCEUR    Imaging: Dg Chest Port 1 View  04/10/2015   CLINICAL DATA:  Tachycardia. History of congestive heart failure, renal failure and hypertension. Initial encounter.  EXAM: PORTABLE CHEST 1 VIEW  COMPARISON:  04/08/2015 and 03/06/2015 radiographs.  FINDINGS: 1801 hours. The heart is enlarged. The patient is rotated to the left. There is increasing vascular congestion and basilar pulmonary opacities and an asymmetric right perihilar component suspicious for edema. There may be a small amount of pleural fluid bilaterally. No evidence  of pneumothorax. The bones appear unchanged.  IMPRESSION: Worsening bilateral airspace opacities with probable pleural effusions. Findings are most consistent with congestive heart failure.   Electronically Signed   By: Carey Bullocks M.D.   On: 04/10/2015 19:27     Medications:     . albuterol  5 mg Nebulization Once  . allopurinol  100 mg Oral Daily  . amoxicillin-clavulanate  1 tablet Oral BID  . aspirin EC  81 mg Oral Daily  . atorvastatin  20 mg Oral Daily  . brimonidine  1 drop Left Eye BID  . clopidogrel  75 mg Oral Daily  . diltiazem  180 mg  Oral Daily  . ferrous sulfate  325 mg Oral BID  . furosemide  80 mg Intravenous BID  . heparin  5,000 Units Subcutaneous 3 times per day  . Influenza vac split quadrivalent PF  0.5 mL Intramuscular Tomorrow-1000  . isosorbide mononitrate  60 mg Oral Daily  . latanoprost  1 drop Both Eyes QHS  . metoprolol  200 mg Oral Daily  . multivitamin with minerals   Oral Daily  . pregabalin  75 mg Oral BID  . senna-docusate  2 tablet Oral QHS  . sodium bicarbonate  650 mg Oral BID  . sodium chloride  3 mL Intravenous Q12H  . sodium polystyrene  15 g Oral Once per day on Mon Thu  . tolnaftate  1 application Topical Daily  . trolamine salicylate  1 application Topical QID   sodium chloride, acetaminophen, HYDROcodone-acetaminophen, sodium chloride  Assessment/ Plan:  [name]. is a 81 y.o. black male with right eye blindness, diastolic congestive heart failure, hypertension, gout, glaucoma, peripheral neuropathy, atrial fibrillation, anemia, carotid stenosis status post bilateral CEA, CVA, peripheral vascular disease who was admitted to [facility] on 04/08/2015 for shortness of breath.   Follows with [facility]phrology  1. Chronic Kidney Disease stage V: patient's volume status and respiratory status has improved with IV furosemide. Creatinine is elevated and patient may benefit from starting hemodialysis soon. However no acute indication for starting hemodialysis during this admission. Patient states he is not ready for dialysis currently.  Kayexalate given this admission, potassiums have been acceptable. Will discontinue scheduled kayexalate.   - Agree with monitoring closely for dialysis need. Monitor volume status and renal function. Patient currently refusing dialysis at this time, low threshold to start.   - Renally dose all medication - holding ACE-I/ARB due to advanced renal disease  2. Hypernatremia: due to overdiuresis with furosemide.  - decrease furosemide to  bid. PO - No  indication for free water. Patient to continue diet as per SLP  2. Hypertension: well controlled blood pressures.  - home regimen of imdur, amlodipine, hydralazine, metoprolol, and fursoemide.   3. Anemia of chronic kidney disease: hemoglobin of 9. Consider ESA as an outpatient.  - iron supplement  4. Secondary Hyperparathyroidism: no PTH or phos available. Calcium at goal.   5. Metabolic Acidosis: carbon dioxide at goal. Continue buffer with sodium bicarbonate.  Addendum at 12:19 Patient willing do hemodialysis. Will consult vascular for permcath placement. Make NPO.  Hepatitis studies ordered Agree with CT of head      LOS: 4 KOLLURU, SARATH 10/5/201611:26 AM

## 2015-04-12 NOTE — Progress Notes (Signed)
Speech Language Pathology Treatment: Dysphagia  Patient Details Name: Sean Delgado. MRN: [id number] DOB: 11-10-1933 Today's Date: 04/12/2015 Time: 4098-1191 SLP Time Calculation (min) (ACUTE ONLY): 60 min  Assessment / Plan / Recommendation Clinical Impression  Pt appeared to tolerate upgraded PO trials of thin liquids by cup (water) -- he held cup and consumed ~8 ounces with no immediate decline in respiratory status or voice quality, or overt s/s of aspiration immediately. However, 2 delayed throat clears noted (mild). ST noted decreased coordination in LUE as Pt spilled water from cup in left hand.  Also noted min decreased tone in L upper lip. Updated NSG and MD. Pt can have thin liquids with strict aspiration precautions (and small sips).   HPI Other Pertinent Information: Pt is more awake today and tolerating his po's w/ no immediate overt s/s of aspiration per NSG staff. Pt is requiring min tray setup assist sec. to vision deficits and overall weakness. Pt's respiratory status appears improved, and pt is not as lethargic w/ eyes closed in his presentation.   Pertinent Vitals Pain Assessment: No/denies pain  SLP Plan  Continue with current plan of care    Recommendations Diet recommendations: Dysphagia 3 (mechanical soft);Thin liquid (with strict aspiration precautions) Liquids provided via: Cup;No straw Medication Administration: Whole meds with puree Supervision: Patient able to self feed;Intermittent supervision to cue for compensatory strategies (and assist with left arm weakness if necessary) Compensations: Minimize environmental distractions;Slow rate;Small sips/bites;Multiple dry swallows after each bite/sip;Follow solids with liquid Postural Changes and/or Swallow Maneuvers: Seated upright 90 degrees              Oral Care Recommendations: Oral care BID;Staff/trained caregiver to provide oral care Follow up Recommendations: Skilled Nursing facility Plan: Continue with  current plan of care    GO     Sean Delgado 04/12/2015, 11:26 AM

## 2015-04-12 NOTE — Care Management Note (Addendum)
Dr Wynelle Link contacted me this afternoon and requested that I start a dialysis placement for this gentleman.  He is followed outpatient by [facility] doctors.  I will speak with patient and family tomorrow about clinic selection and schedule. Also I will educate patient.  Ivor Reining Dialysis Liaison  [phone]

## 2015-04-12 NOTE — Evaluation (Signed)
Physical Therapy Evaluation Patient Details Name: Sean Delgado. MRN: [id number] DOB: [birth year] Today's Date: 04/12/2015   History of Present Illness  Pt is a 81 y.o. male presenting to hospital with SOB.  Pt admitted with acute respiratory failure, R side PNA, and a-fib. PMH:  CAD, htn, h/o CHF, gout, glaucoma, peripheral neuropathy, CKD stage IV. Pt has been at [facility] for STR for the last month due to increasing weakness and SOB. Prior to SNF placement pt was living in independent living apartment at [facility]. Pt receives care through [facility] and has HH aids who help with ADLs/IADLs. Pt endorses 1 fall in the last 12 months.  Clinical Impression  Pt demonstrates generalized weakness and difficulty with transfers and ambulation. He is only able to ambulate a short distance with +2 assistance due to LLE buckling and poor balance. Pt requires quick return to sitting due to LE buckling to prevent fall. Pt will need to return to SNF at discharge. Pt will benefit from skilled PT services to address deficits in strength, balance, and mobility in order to return to full function at home.     Follow Up Recommendations SNF    Equipment Recommendations  None recommended by PT    Recommendations for Other Services       Precautions / Restrictions Precautions Precautions: Fall Restrictions Weight Bearing Restrictions: No      Mobility  Bed Mobility Overal bed mobility: Needs Assistance Bed Mobility: Supine to Sit     Supine to sit: Mod assist     General bed mobility comments: Assist to come to full sitting and to scoot toward EOB. Cues for sequencing and weight shifting from side to side to scoot forward  Transfers Overall transfer level: Needs assistance Equipment used: Rolling walker (2 wheeled) Transfers: Sit to/from Stand Sit to Stand: Min assist;+2 safety/equipment         General transfer comment: Pt with decreased LE strength during transfer. Poor anterior weight  shifting with posterior lean requiring therapist correction to prevent fall. Pt uses bed on back of knees for support  Ambulation/Gait Ambulation/Gait assistance: Min assist;+2 physical assistance Ambulation Distance (Feet): 5 Feet Assistive device: Rolling walker (2 wheeled) Gait Pattern/deviations: Decreased step length - right;Decreased step length - left   Gait velocity interpretation: <1.8 ft/sec, indicative of risk for recurrent falls General Gait Details: L knee buckling during ambulation requiring quick chair assist to sit back down and prevent fall. pt with decreased step length bilaterally and poor balance with therapist support to prevent fall  Stairs            Wheelchair Mobility    Modified Rankin (Stroke Patients Only)       Balance Overall balance assessment: Needs assistance   Sitting balance-Leahy Scale: Fair (Infrequent LOB posterior in sitting requiring UE support)       Standing balance-Leahy Scale: Poor                               Pertinent Vitals/Pain Pain Assessment: No/denies pain    Home Living Family/patient expects to be discharged to:: Skilled nursing facility                      Prior Function Level of Independence: Needs assistance   Gait / Transfers Assistance Needed: independent with rollator (Most recently pt requiring +1 assist from staff at [facility])  ADL's / Homemaking Assistance Needed: assist for  bathing  Comments: Nursing aides and [facility] program     Hand Dominance        Extremity/Trunk Assessment   Upper Extremity Assessment: Generalized weakness;LUE deficits/detail       LUE Deficits / Details: Pt presents with decreased L shoulder strength and can only flex shoulder to approximately 45 degrees against gravity. L elbow flexion/extension appears grossly symmetrical. Decreased grip strength bilateral   Lower Extremity Assessment: LLE deficits/detail;Generalized weakness   LLE Deficits /  Details: 3+/5 bilateral hip flexion, Otherwise bilaterally deconditioning and grossly 4- to 4/5 throughout.  Cervical / Trunk Assessment:  (Forward head and neck)  Communication   Communication: No difficulties  Cognition Arousal/Alertness: Awake/alert Behavior During Therapy: WFL for tasks assessed/performed Overall Cognitive Status: Within Functional Limits for tasks assessed       Memory:  (Pt is AOx4 at time of evaluation)              General Comments      Exercises General Exercises - Lower Extremity Long Arc Quad: Strengthening;Both;15 reps;Seated Heel Slides: Strengthening;Both;15 reps;Seated Hip ABduction/ADduction: Strengthening;Both;15 reps;Seated Hip Flexion/Marching: Strengthening;Both;15 reps;Seated      Assessment/Plan    PT Assessment Patient needs continued PT services  PT Diagnosis Difficulty walking;Abnormality of gait;Generalized weakness   PT Problem List Decreased strength;Decreased activity tolerance;Decreased balance;Decreased mobility;Decreased safety awareness  PT Treatment Interventions DME instruction;Gait training;Functional mobility training;Therapeutic exercise;Therapeutic activities;Balance training;Neuromuscular re-education   PT Goals (Current goals can be found in the Care Plan section) Acute Rehab PT Goals Patient Stated Goal: "I want to get stronger" PT Goal Formulation: With patient Time For Goal Achievement: 04/26/15 Potential to Achieve Goals: Fair    Frequency Min 2X/week   Barriers to discharge        Co-evaluation               End of Session Equipment Utilized During Treatment: Gait belt Activity Tolerance: Patient tolerated treatment well Patient left: in chair;with call bell/phone within reach;with chair alarm set Nurse Communication: Mobility status         Time: 1100-1135 PT Time Calculation (min) (ACUTE ONLY): 35 min   Charges:   PT Evaluation $Initial PT Evaluation Tier I: 1 Procedure PT  Treatments $Therapeutic Exercise: 8-22 mins   PT G Codes:       Sharalyn Ink Annaleah Arata PT, DPT   Kimberley Speece 04/12/2015, 11:58 AM

## 2015-04-12 NOTE — H&P (Signed)
[facility] Admission History & Physical  MRN : [id number]  Sean Delgado. is a 81 y.o. ([birth year]) male who presents with chief complaint of  Chief Complaint  Patient presents with  . Shortness of Breath  .  History of Present Illness: Patient with known CKD.  Is one week s/p left arm AVF placement, and this will not be useable for several more weeks.  He is admitted with SOB and volume overload.  His CKD has now transitioned to ESRD and he is in need of HD.  We are asked to place a permcath  Current Facility-Administered Medications  Medication Dose Route Frequency Provider Last Rate Last Dose  . 0.9 %  sodium chloride infusion  250 mL Intravenous PRN Gracelyn Nurse, MD      . 0.9 %  sodium chloride infusion   Intravenous Continuous Annice Needy, MD      . acetaminophen (TYLENOL) tablet 650 mg  650 mg Oral Q6H PRN Gracelyn Nurse, MD      . albuterol (PROVENTIL) (2.5 MG/3ML) 0.083% nebulizer solution 5 mg  5 mg Nebulization Once Jennye Moccasin, MD   5 mg at 04/08/15 1628  . allopurinol (ZYLOPRIM) tablet 100 mg  100 mg Oral Daily Gracelyn Nurse, MD   100 mg at 04/12/15 0911  . amoxicillin-clavulanate (AUGMENTIN) 500-125 MG per tablet 500 mg  1 tablet Oral BID Katharina Caper, MD   500 mg at 04/12/15 0910  . aspirin EC tablet 81 mg  81 mg Oral Daily Gracelyn Nurse, MD   81 mg at 04/12/15 0911  . atorvastatin (LIPITOR) tablet 20 mg  20 mg Oral Daily Foye Deer, RPH   20 mg at 04/12/15 8657  . brimonidine (ALPHAGAN) 0.2 % ophthalmic solution 1 drop  1 drop Left Eye BID Gracelyn Nurse, MD   1 drop at 04/12/15 0934  . clopidogrel (PLAVIX) tablet 75 mg  75 mg Oral Daily Gracelyn Nurse, MD   75 mg at 04/12/15 0910  . [START ON 04/13/2015] diltiazem (CARDIZEM CD) 24 hr capsule 180 mg  180 mg Oral Daily Katharina Caper, MD      . ferrous sulfate tablet 325 mg  325 mg Oral BID Gracelyn Nurse, MD   325 mg at 04/12/15 8469  . furosemide (LASIX) tablet 40 mg  40 mg  Oral BID Lamont Dowdy, MD      . heparin injection 5,000 Units  5,000 Units Subcutaneous 3 times per day Gracelyn Nurse, MD   Stopped at 04/12/15 1400  . HYDROcodone-acetaminophen (NORCO/VICODIN) 5-325 MG per tablet 1 tablet  1 tablet Oral Q6H PRN Katharina Caper, MD      . Influenza vac split quadrivalent PF (FLUARIX) injection 0.5 mL  0.5 mL Intramuscular Tomorrow-1000 Katharina Caper, MD   0.5 mL at 04/12/15 1000  . isosorbide mononitrate (IMDUR) 24 hr tablet 60 mg  60 mg Oral Daily Gracelyn Nurse, MD   60 mg at 04/12/15 0910  . latanoprost (XALATAN) 0.005 % ophthalmic solution 1 drop  1 drop Both Eyes QHS Gracelyn Nurse, MD   1 drop at 04/11/15 2238  . metoprolol succinate (TOPROL-XL) 24 hr tablet 100 mg  100 mg Oral 3 times per day Katharina Caper, MD      . multivitamin with minerals tablet   Oral Daily Gracelyn Nurse, MD   1 tablet at 04/11/15 1021  . pregabalin (LYRICA) capsule 75 mg  75  mg Oral BID Gracelyn Nurse, MD   75 mg at 04/12/15 0910  . senna-docusate (Senokot-S) tablet 2 tablet  2 tablet Oral QHS Gracelyn Nurse, MD   2 tablet at 04/10/15 2208  . sodium bicarbonate tablet 650 mg  650 mg Oral BID Gracelyn Nurse, MD   650 mg at 04/12/15 0910  . sodium chloride 0.9 % injection 3 mL  3 mL Intravenous Q12H Gracelyn Nurse, MD   3 mL at 04/11/15 2239  . sodium chloride 0.9 % injection 3 mL  3 mL Intravenous PRN Gracelyn Nurse, MD      . tolnaftate (TINACTIN) 1 % powder 1 application  1 application Topical Daily Gracelyn Nurse, MD   1 application at 04/08/15 2329  . trolamine salicylate (ASPERCREME) 10 % cream 1 application  1 application Topical QID Gracelyn Nurse, MD   1 application at 04/12/15 4540    Past Medical History  Diagnosis Date  . Chronic renal failure, stage 4 (severe) (HCC)   . Hypertension   . CHF (congestive heart failure) (HCC)   . Gout   . Glaucoma   . Neuropathy (HCC)   . Stroke (HCC)   . Peripheral vascular disease (HCC)   . Coronary artery disease   .  A-fib (HCC)   . Dysrhythmia   . Blindness of right eye   . Anemia     iron deficiency    Past Surgical History  Procedure Laterality Date  . Vascular surgery Bilateral     Carotid Endarterectomy  . Eye surgery Left     Cataract Extraction  . Av fistula placement Left 04/06/2015    Procedure: ARTERIOVENOUS (AV) FISTULA CREATION;  Surgeon: Annice Needy, MD;  Location: [facility] ORS;  Service: Vascular;  Laterality: Left;    Social History Social History  Substance Use Topics  . Smoking status: Former Smoker -- 0.25 packs/day for 15 years    Types: Cigarettes    Quit date: 09/06/2006  . Smokeless tobacco: Never Used  . Alcohol Use: No  No IVDU  Family History Family History  Problem Relation Age of Onset  . Diabetes Mother   No bleeding disorders, clotting disorders, or autoimmune diseases  No Known Allergies   REVIEW OF SYSTEMS (Negative unless checked)  Constitutional: Weight loss  Fever  Chills Cardiac: Chest pain   Chest pressure   Palpitations   Shortness of breath when laying flat   Shortness of breath at rest   Shortness of breath with exertion. Vascular:  Pain in legs with walking   Pain in legs at rest   Pain in legs when laying flat   Claudication   Pain in feet when walking  Pain in feet at rest  Pain in feet when laying flat   History of DVT   Phlebitis   Swelling in legs   Varicose veins   Non-healing ulcers Pulmonary:   Uses home oxygen   Productive cough   Hemoptysis   Wheeze  COPD   Asthma Neurologic:  Dizziness  Blackouts   Seizures   History of stroke   History of TIA  Aphasia   Temporary blindness   Dysphagia   Weakness or numbness in arms   Weakness or numbness in legs Musculoskeletal:  Arthritis   Joint swelling   Joint pain   Low back pain Hematologic:  Easy bruising  Easy bleeding   Hypercoagulable state   Anemic  Hepatitis Gastrointestinal:  Blood in stool      Vomiting blood  [] Gastroesophageal reflux/heartburn   [] Difficulty swallowing. Genitourinary:  [x] Chronic kidney disease   [] Difficult urination  [] Frequent urination  [] Burning with urination   [] Blood in urine Skin:  [] Rashes   [] Ulcers   [] Wounds Psychological:  [] History of anxiety   []  History of major depression.  Physical Examination  Filed Vitals:   04/12/15 0344 04/12/15 0754 04/12/15 1504 04/12/15 1505  BP: 153/69 116/55  125/96  Pulse: 114 126 88 112  Temp: 98.2 F (36.8 C) 98.4 F (36.9 C)    TempSrc: Oral Oral    Resp: 21 20 15 15   Height:      Weight:      SpO2: 94% 95% 95% 94%   Body mass index is 33.81 kg/(m^2). Gen: WD/WN, NAD Head: [place]/AT, No temporalis wasting. Prominent temp pulse not noted. Ear/Nose/Throat: Hearing grossly intact, nares w/o erythema or drainage, oropharynx w/o Erythema/Exudate,  Eyes: PERRLA, EOMI.  Neck: Supple, no nuchal rigidity.  No bruit or JVD.  Pulmonary:  Crackles bilaterally,  use of accessory muscles.  Cardiac: RRR, normal S1, S2, no Murmurs, rubs or gallops. Vascular:  Vessel Right Left  Radial Palpable Palpable                                   Gastrointestinal: soft, non-tender/non-distended. No guarding/reflex.  Musculoskeletal: M/S 5/5 throughout.  Extremities without ischemic changes.  No deformity or atrophy. 2+ LE edema bilaterally Neurologic: CN 2-12 intact. Pain and light touch intact in extremities.  Symmetrical.  Speech is fluent. Motor exam as listed above. Psychiatric: more somnulent than usual.  Does answer questions appropriately Dermatologic: No rashes or ulcers noted.  No cellulitis or open wounds. Lymph : No Cervical, Axillary, or Inguinal lymphadenopathy.      CBC Lab Results  Component Value Date   WBC 8.6 04/11/2015   HGB 9.0* 04/11/2015   HCT 28.9* 04/11/2015   MCV 89.1 04/11/2015   PLT 166 04/11/2015    BMET    Component Value Date/Time   NA 151* 04/12/2015 1046   K 4.7 04/12/2015  1046   CL 115* 04/12/2015 1046   CO2 30 04/12/2015 1046   GLUCOSE 166* 04/12/2015 1046   BUN 73* 04/12/2015 1046   CREATININE 4.54* 04/12/2015 1046   CALCIUM 8.4* 04/12/2015 1046   GFRNONAA 11* 04/12/2015 1046   GFRAA 13* 04/12/2015 1046   Estimated Creatinine Clearance: 17.9 mL/min (by C-G formula based on Cr of 4.54).  COAG Lab Results  Component Value Date   INR 1.10 03/30/2015    Radiology Ct Head Wo Contrast  04/12/2015   CLINICAL DATA:  Left-sided weakness.  EXAM: CT HEAD WITHOUT CONTRAST  TECHNIQUE: Contiguous axial images were obtained from the base of the skull through the vertex without intravenous contrast.  COMPARISON:  MRI scan of January 03, 2009.  FINDINGS: Bony calvarium appears intact. Moderate diffuse cortical atrophy is noted. Mild chronic ischemic white matter disease is noted. No mass effect or midline shift is noted. Ventricular size is within normal limits. There is no evidence of mass lesion, hemorrhage or acute infarction.  IMPRESSION: Moderate diffuse cortical atrophy. Mild chronic ischemic white matter disease. No acute intracranial abnormality seen.   Electronically Signed   By: Lupita Raider, M.D.   On: 04/12/2015 13:39   Dg Chest Port 1 View  04/10/2015   CLINICAL DATA:  Tachycardia. History of congestive heart failure, renal failure and hypertension. Initial  encounter.  EXAM: PORTABLE CHEST 1 VIEW  COMPARISON:  04/08/2015 and 03/06/2015 radiographs.  FINDINGS: 1801 hours. The heart is enlarged. The patient is rotated to the left. There is increasing vascular congestion and basilar pulmonary opacities and an asymmetric right perihilar component suspicious for edema. There may be a small amount of pleural fluid bilaterally. No evidence of pneumothorax. The bones appear unchanged.  IMPRESSION: Worsening bilateral airspace opacities with probable pleural effusions. Findings are most consistent with congestive heart failure.   Electronically Signed   By: Carey Bullocks M.D.   On: 04/10/2015 19:27   Dg Chest Port 1 View  04/08/2015   CLINICAL DATA:  Shortness of breath and decreased oxygen saturation  EXAM: PORTABLE CHEST 1 VIEW  COMPARISON:  March 06, 2015  FINDINGS: There is focal airspace opacity in the right upper lobe, concerning for pneumonia. There is generalized interstitial prominence with cardiomegaly suggesting a degree of underlying chronic congestive heart failure. No adenopathy. The pulmonary vascularity is within normal limits.  IMPRESSION: Right upper lobe pneumonia. Suspect a degree of underlying chronic congestive heart failure.   Electronically Signed   By: Bretta Bang III M.D.   On: 04/08/2015 16:09      Assessment/Plan 1. CKD now transitioned to ESRD and need for dialysis.  AVF not yet ready to use.  Will place Permcath 2. Carotid artery stenosis. Stable. S/p stent placement bilaterally many years ago 3. Volume overload.  Should benefit from dialysis 4. HTN. Should also benefit from dialysis   Kennard Fildes, MD  04/12/2015 3:36 PM

## 2015-04-12 NOTE — Op Note (Signed)
OPERATIVE NOTE    PRE-OPERATIVE DIAGNOSIS: 1. ESRD 2. Volume overload requiring dialysis 3. Hypertension 4. Carotid artery stenosis  POST-OPERATIVE DIAGNOSIS: same as above  PROCEDURE: 1. Ultrasound guidance for vascular access to the right internal jugular vein 2. Fluoroscopic guidance for placement of catheter 3. Placement of a 23 cm tip to cuff tunneled hemodialysis catheter via the right internal jugular vein  SURGEON: Festus Barren, MD  ANESTHESIA:  Local/MCS  ESTIMATED BLOOD LOSS: 25 cc  FINDING(S): 1.  Patent right internal jugular vein  SPECIMEN(S):  None  INDICATIONS:   Rolland Steinert. is a 81 y.o. male who presents with shortness of breath and volume overload.  His CKD has now transitioned to ESRD and he needs dialysis.  His AVF is only one week old and not ready to be used.  The patient needs long term dialysis access for their ESRD, and a Permcath is necessary.  Risks and benefits are discussed and informed consent is obtained.    DESCRIPTION: After obtaining full informed written consent, the patient was brought back to the vascular suited. The patient's right neck and chest were sterilely prepped and draped in a sterile surgical field was created.  The right internal jugular vein was visualized with ultrasound and found to be patent. It was then accessed under direct ultrasound guidance and a permanent image was recorded. A wire was placed. After skin nick and dilatation, the peel-away sheath was placed over the wire. I then turned my attention to an area under the clavicle. Approximately 1-2 fingerbreadths below the clavicle a small counterincision was created and tunneled from the subclavicular incision to the access site. Using fluoroscopic guidance, a 23 centimeter tip to cuff tunneled hemodialysis catheter was selected, and tunneled from the subclavicular incision to the access site. It was then placed through the peel-away sheath and the peel-away sheath was removed.  Using fluoroscopic guidance the catheter tips were parked in the right atrium. The appropriate distal connectors were placed. It withdrew blood well and flushed easily with heparinized saline and a concentrated heparin solution was then placed. It was secured to the chest wall with 2 Prolene sutures. The access incision was closed single 4-0 Monocryl. A 4-0 Monocryl pursestring suture was placed around the exit site. Sterile dressings were placed. The patient tolerated the procedure well and was taken to the recovery room in stable condition.  COMPLICATIONS: None  CONDITION: Stable  Deklynn Charlet  04/12/2015, 3:42 PM

## 2015-04-12 NOTE — Progress Notes (Signed)
Per MD patient is not medically stable for D/C today. Kim from [facility] came to visit patient. CSW updated Kim on patient's status. CSW made [facility] admissions coordinator at [facility] aware of above. CSW will continue to follow and assist as needed.   Jetta Lout, LCSWA [phone]

## 2015-04-12 NOTE — Care Management (Signed)
[facility] nurse Kim rounded on this patient today.

## 2015-04-12 NOTE — Progress Notes (Addendum)
[facility] -  at [facility]   PATIENT NAME: Sean Delgado    MR#:  [id number]  DATE OF BIRTH:  1934-06-18  SUBJECTIVE:  CHIEF COMPLAINT:   Chief Complaint  Patient presents with  . Shortness of Breath  the patient is a 81 year old African-American male with past medical history significant for history of CAD, stroke, peripheral vascular disease, coronary artery disease, multiple other medical problems who presents to the hospital with complaints of acute shortness of breath, nonproductive cough. On arrival to emergency room, he was noted to have right upper lobe pneumonia and admitted to the hospital for further evaluation and treatment. Patient was initiated on Zosyn. However, he got fluid overloaded and his chest x-ray revealed worsening congestive heart failure . His oxygen saturations where low at 89% on oxygen therapy through nasal cannula yesterday. He was somnolent.  ABGs performed on oxygen therapy revealed a pH of 7.33, PCO2 52, patient was initiated on BiPAP, now on oxygen therapy per nasal cannulas only. Oxygen saturations were low at nighttime and patient was placed on 40% FiO2 Ventimask, but now weaned down to 4 L of oxygen per nasal cannulas. Patient is receiving high doses of furosemide IV and he her a prescription kidney function remains stable. His breathing also has improved today. He feels good. Heart rate remains high at 110s to 130s. Patient was noted to have left facial droop as well as left upper extremity weakness today     Review of Systems  Constitutional: Negative for fever, chills and weight loss.  HENT: Negative for congestion.   Eyes: Negative for blurred vision and double vision.  Respiratory: Positive for cough and shortness of breath. Negative for sputum production and wheezing.   Cardiovascular: Negative for chest pain, palpitations, orthopnea, leg swelling and PND.  Gastrointestinal: Negative for nausea, vomiting, abdominal  pain, diarrhea, constipation and blood in stool.  Genitourinary: Negative for dysuria, urgency, frequency and hematuria.  Musculoskeletal: Negative for falls.  Neurological: Negative for dizziness, tremors, focal weakness and headaches.  Endo/Heme/Allergies: Does not bruise/bleed easily.  Psychiatric/Behavioral: Negative for depression. The patient does not have insomnia.     VITAL SIGNS: Blood pressure 116/55, pulse 126, temperature 98.4 F (36.9 C), temperature source Oral, resp. rate 20, height  (1.854 m), weight 116.212 kg (256 lb 3.2 oz), SpO2 95 %.  PHYSICAL EXAMINATION:   GENERAL:  81 y.o.-year-old patient sitting in the bed with no acute distress. More alert and comfortable , communicates  Well. Left facial weakness, lower face only with left lip droop.  EYES: Pupils equal, round, reactive to light and accommodation. No scleral icterus. Extraocular muscles intact.  HEENT: Head atraumatic, normocephalic. Oropharynx and nasopharynx clear. Slurring speech NECK:  Supple, no jugular venous distention. No thyroid enlargement, no tenderness.  LUNGS: Some diminished breath sounds bilaterally at bases, no wheezing, rales,rhonchi or crepitation. No use of accessory muscles of respiration.  CARDIOVASCULAR: S1, S2 normal. No murmurs, rubs, or gallops.  ABDOMEN: Soft, nontender, nondistended. Bowel sounds present. No organomegaly or mass.  EXTREMITIES: No pedal edema, cyanosis, or clubbing.  NEUROLOGIC: Cranial nerves . Muscle strength is 5 out of 5 in all 4 extremities Sensation grossly intact. Gait not checked.  Left hand weakness PSYCHIATRIC: The patient is some more alert and oriented , comfortable SKIN: No obvious rash, lesion, or ulcer.   ORDERS/RESULTS REVIEWED:   CBC  Recent Labs Lab 04/08/15 1604 04/11/15 0604  WBC 7.7 8.6  HGB 9.0* 9.0*  HCT 28.6* 28.9*  PLT 159 166  MCV 89.2 89.1  MCH 28.0 27.7  MCHC 31.4* 31.1*  RDW 17.0* 16.8*  LYMPHSABS 0.6*  --   MONOABS 0.7   --   EOSABS 0.3  --   BASOSABS 0.1  --    ------------------------------------------------------------------------------------------------------------------  Chemistries   Recent Labs Lab 04/08/15 1605 04/09/15 0400 04/10/15 1314 04/12/15 1046  NA 144 147* 142 151*  K 5.3* 4.6 4.6 4.7  CL 111 112* 107 115*  CO2 GLUCOSE 129* 102* 141* 166*  BUN 70* 70* 75* 73*  CREATININE 4.08* 3.91* 4.63* 4.54*  CALCIUM 7.8* 8.1* 8.1* 8.4*  AST 32  --   --   --   ALT 24  --   --   --   ALKPHOS 87  --   --   --   BILITOT 0.7  --   --   --    ------------------------------------------------------------------------------------------------------------------ estimated creatinine clearance is 17.9 mL/min (by C-G formula based on Cr of 4.54). ------------------------------------------------------------------------------------------------------------------  Recent Labs  04/11/15 1228  TSH 0.110*    Cardiac Enzymes  Recent Labs Lab 04/08/15 1604  TROPONINI 0.03   ------------------------------------------------------------------------------------------------------------------ Invalid input(s): POCBNP ---------------------------------------------------------------------------------------------------------------  RADIOLOGY: Dg Chest Port 1 View  04/10/2015   CLINICAL DATA:  Tachycardia. History of congestive heart failure, renal failure and hypertension. Initial encounter.  EXAM: PORTABLE CHEST 1 VIEW  COMPARISON:  04/08/2015 and 03/06/2015 radiographs.  FINDINGS: 1801 hours. The heart is enlarged. The patient is rotated to the left. There is increasing vascular congestion and basilar pulmonary opacities and an asymmetric right perihilar component suspicious for edema. There may be a small amount of pleural fluid bilaterally. No evidence of pneumothorax. The bones appear unchanged.  IMPRESSION: Worsening bilateral airspace opacities with probable pleural effusions. Findings are most  consistent with congestive heart failure.   Electronically Signed   By: Carey Bullocks M.D.   On: 04/10/2015 19:27    EKG:  Orders placed or performed during the hospital encounter of 04/08/15  . EKG 12-Lead  . EKG 12-Lead  . ED EKG  . ED EKG  . EKG 12-Lead  . EKG 12-Lead    ASSESSMENT AND PLAN:  Active Problems:   Acute respiratory failure (HCC) 1. Acute respiratory failure with hypoxia and hypercapnia due to pneumonia and acute on chronic diastolic congestive heart failure,  continue oxygen therapy, keeping pulse oximeter at around 88-92% , also BiPAP intermittently. He would benefit from sleep study as outpatient to rule out obstructive sleep apnea and need for nocturnal CPAP. Initiate CPAP at night , discussed this has persisted therapist today . [name] most recent chest x-ray revealed worsening CHF, now on advanced Lasix, following urinary output, unfortunately, intake and output is not documented over the past 24 hours, requesting strict in and out documentation,  appreciate nephrologist input with  recommendations , no indications for hemodialysis at present  2. Acute on chronic diastolic congestive heart failure as above, continue Lasix intravenously following in's and outs, getting nephrologist involved to follow with [place] and  initiate hemodialysis if needed. Echocardiogram done in August 2016 revealed normal ejection fraction but moderate MR.  3. Right upper lobe bacterial pneumonia of unclear bacteriologic agent, off Zosyn. Continue Augmentin as levofloxacin prolongs QTc interval , get sputum cultures if possible , none obtained 4. Dysphagia, questionable aspiration pneumonitis. Getting speech therapist involved for further recommendations. Nothing by mouth due to facial weakness. May need modified barium swallowing study done. If CT scan shows acute stroke  5. Hyperkalemia, resolved with therapy 6. Anemia. Get Hemoccult 7. Generalized weakness , getting physical therapist  involved for further recommendations. Likely skilled nursing facility placement, family once the patient to be placed, but patient is refusing discussed with care management 8. A. fib, RVR, advance Cardizem CD, continue metoprolol,  appreciate cardiologist recommendations, including anticoagulation, we will initiate a heparin IV. If patient has no new stroke , echocardiogram in August 2016 unremarkable except of MR 9. Left hand and left facial weakness, concerning for acute stroke. Getting CT scan of head with no contrast  Management plans discussed with the patient, family and they are in agreement.   DRUG ALLERGIES: No Known Allergies  CODE STATUS:     Code Status Orders        Start     Ordered   04/08/15 2032  Full code   Continuous     04/08/15 2031      TOTAL TIME TAKING CARE OF THIS PATIENT: 50 minutes .  Discussed with daughter today for 10 minutes  Nidia Grogan M.D on 04/12/2015 at 1:35 PM  Between 7am to 6pm - Pager - [phone]  After 6pm go to [email or web address] - password [email or web address] [facility]  [facility]  Office  [phone]  CC: Primary care physician; Bobbye Morton, MD

## 2015-04-12 NOTE — Progress Notes (Signed)
[facility] Cardiology [facility] Encounter Note  Patient: Sean Delgado. / Admit Date: 04/08/2015 / Date of Encounter: 04/12/2015, 8:32 AM   Subjective: Patient continues to be short of breath with lower extremity edema and weakness with some cough and congestion improving from pneumonia Heart rate somewhat rapid today with atrial fibrillation  Review of Systems: Positive for: Shortness of breath cough and congestion Negative for: Vision change, hearing change, syncope, dizziness, nausea, vomiting,diarrhea, bloody stool, stomach pain,  diaphoresis, urinary frequency, urinary pain,skin lesions, skin rashes Others previously listed  Objective: Telemetry: Atrial fibrillation with variable heart rate Physical Exam: Blood pressure 116/55, pulse 126, temperature 98.4 F (36.9 C), temperature source Oral, resp. rate 20, height  (1.854 m), weight 256 lb 3.2 oz (116.212 kg), SpO2 95 %. Body mass index is 33.81 kg/(m^2). General: Well developed, well nourished, in no acute distress. Head: Normocephalic, atraumatic, sclera non-icteric, no xanthomas, nares are without discharge. Neck: No apparent masses Lungs: Normal respirations with some wheezes, diffuse rhonchi, no rales , basilar crackles   Heart: Irregular rate and rhythm, normal S1 S2, apical murmur, no rub, no gallop, PMI is diffuse placement, carotid upstroke normal without bruit, jugular venous pressure normal Abdomen: Soft, non-tender, non-distended with normoactive bowel sounds. No hepatosplenomegaly. Abdominal aorta is normal size without bruit Extremities: 1+ edema, no clubbing, no cyanosis, positive ulcers,  Peripheral: 2+ radial, 2+ femoral, 2+ dorsal pedal pulses Neuro: Alert and oriented. Moves all extremities spontaneously. Psych:  Responds to questions appropriately with a normal affect.   Intake/Output Summary (Last 24 hours) at 04/12/15 0832 Last data filed at 04/11/15 2240  Gross per 24 hour  Intake    600 ml   Output      0 ml  Net    600 ml    Inpatient Medications:  . albuterol  5 mg Nebulization Once  . allopurinol  100 mg Oral Daily  . amoxicillin-clavulanate  1 tablet Oral BID  . aspirin EC  81 mg Oral Daily  . atorvastatin  20 mg Oral Daily  . brimonidine  1 drop Left Eye BID  . clopidogrel  75 mg Oral Daily  . diltiazem  180 mg Oral Daily  . ferrous sulfate  325 mg Oral BID  . furosemide  80 mg Intravenous BID  . heparin  5,000 Units Subcutaneous 3 times per day  . Influenza vac split quadrivalent PF  0.5 mL Intramuscular Tomorrow-1000  . isosorbide mononitrate  60 mg Oral Daily  . latanoprost  1 drop Both Eyes QHS  . metoprolol  200 mg Oral Daily  . multivitamin with minerals   Oral Daily  . pregabalin  75 mg Oral BID  . senna-docusate  2 tablet Oral QHS  . sodium bicarbonate  650 mg Oral BID  . sodium chloride  3 mL Intravenous Q12H  . sodium polystyrene  15 g Oral Once per day on Mon Thu  . tolnaftate  1 application Topical Daily  . trolamine salicylate  1 application Topical QID   Infusions:    Labs:  Recent Labs  04/10/15 1314  NA 142  K 4.6  CL 107  CO2 25  GLUCOSE 141*  BUN 75*  CREATININE 4.63*  CALCIUM 8.1*   No results for input(s): AST, ALT, ALKPHOS, BILITOT, PROT, ALBUMIN in the last 72 hours.  Recent Labs  04/11/15 0604  WBC 8.6  HGB 9.0*  HCT 28.9*  MCV 89.1  PLT 166   No results for input(s): CKTOTAL, CKMB, TROPONINI  in the last 72 hours. Invalid input(s): POCBNP No results for input(s): HGBA1C in the last 72 hours.   Weights: Filed Weights   04/08/15 1540 04/08/15 2034  Weight: 257 lb (116.574 kg) 256 lb 3.2 oz (116.212 kg)     Radiology/Studies:  Dg Chest Port 1 View  04/10/2015   CLINICAL DATA:  Tachycardia. History of congestive heart failure, renal failure and hypertension. Initial encounter.  EXAM: PORTABLE CHEST 1 VIEW  COMPARISON:  04/08/2015 and 03/06/2015 radiographs.  FINDINGS: 1801 hours. The heart is enlarged. The  patient is rotated to the left. There is increasing vascular congestion and basilar pulmonary opacities and an asymmetric right perihilar component suspicious for edema. There may be a small amount of pleural fluid bilaterally. No evidence of pneumothorax. The bones appear unchanged.  IMPRESSION: Worsening bilateral airspace opacities with probable pleural effusions. Findings are most consistent with congestive heart failure.   Electronically Signed   By: Carey Bullocks M.D.   On: 04/10/2015 19:27   Dg Chest Port 1 View  04/08/2015   CLINICAL DATA:  Shortness of breath and decreased oxygen saturation  EXAM: PORTABLE CHEST 1 VIEW  COMPARISON:  March 06, 2015  FINDINGS: There is focal airspace opacity in the right upper lobe, concerning for pneumonia. There is generalized interstitial prominence with cardiomegaly suggesting a degree of underlying chronic congestive heart failure. No adenopathy. The pulmonary vascularity is within normal limits.  IMPRESSION: Right upper lobe pneumonia. Suspect a degree of underlying chronic congestive heart failure.   Electronically Signed   By: Bretta Bang III M.D.   On: 04/08/2015 16:09     Assessment and Recommendation  81 y.o. male with coronary artery disease significant peripheral vascular disease sleep apnea with CPAP machine having acute pneumonia hypoxia and respiratory failure exacerbating nonvalvular atrial fibrillation with rapid ventricular rate and acute on chronic diastolic dysfunction heart failure slightly improved today with better pulmonary toilet generation 1. Continue combination of medication management including metoprolol and diltiazem for heart rate control below 110 bpm  2. Consideration of anticoagulation instead of Plavix and aspirin due to atrial fibrillation of stroke with following for concerns of the possibility of bleeding complications 3. Aggressive treatment of volume overload due to diastolic dysfunction and hypoxia 4. Oxygenation  with CPAP machine at night for improvements of oxygenation and reduction of atrial fibrillation with rapid ventricular rate is   5. No further cardiac diagnostics necessary at this time 6. Begin ambulation with adjustments of medication management and heart rate control 7. Call if further questions  Signed, Arnoldo Hooker M.D. FACC

## 2015-04-13 ENCOUNTER — Inpatient Hospital Stay: Payer: Medicare (Managed Care)

## 2015-04-13 LAB — RENAL FUNCTION PANEL
Albumin: 2.6 g/dL — ABNORMAL LOW (ref 3.5–5.0)
Anion gap: 9 (ref 5–15)
BUN: 76 mg/dL — ABNORMAL HIGH (ref 6–20)
CHLORIDE: 108 mmol/L (ref 101–111)
CO2: 30 mmol/L (ref 22–32)
CREATININE: 4.22 mg/dL — AB (ref 0.61–1.24)
Calcium: 8.2 mg/dL — ABNORMAL LOW (ref 8.9–10.3)
GFR calc non Af Amer: 12 mL/min — ABNORMAL LOW (ref >60)
GFR, EST AFRICAN AMERICAN: 14 mL/min — AB (ref >60)
Glucose, Bld: 164 mg/dL — ABNORMAL HIGH (ref 65–99)
Phosphorus: 4.6 mg/dL (ref 2.5–4.6)
Potassium: 4.3 mmol/L (ref 3.5–5.1)
Sodium: 147 mmol/L — ABNORMAL HIGH (ref 135–145)

## 2015-04-13 LAB — CBC
HEMATOCRIT: 29.5 % — AB (ref 40.0–52.0)
HEMOGLOBIN: 9.1 g/dL — AB (ref 13.0–18.0)
MCH: 27.4 pg (ref 26.0–34.0)
MCHC: 30.7 g/dL — ABNORMAL LOW (ref 32.0–36.0)
MCV: 89.1 fL (ref 80.0–100.0)
Platelets: 154 10*3/uL (ref 150–440)
RBC: 3.31 MIL/uL — AB (ref 4.40–5.90)
RDW: 17.2 % — ABNORMAL HIGH (ref 11.5–14.5)
WBC: 6.5 10*3/uL (ref 3.8–10.6)

## 2015-04-13 LAB — HEPATITIS B SURFACE ANTIBODY,QUALITATIVE: Hep B S Ab: NONREACTIVE

## 2015-04-13 LAB — HEPATITIS B SURFACE ANTIGEN: Hepatitis B Surface Ag: NEGATIVE

## 2015-04-13 LAB — HEPATITIS B CORE ANTIBODY, IGM: HEP B C IGM: NEGATIVE

## 2015-04-13 MED ORDER — TOLNAFTATE 1 % EX POWD
Freq: Two times a day (BID) | CUTANEOUS | Status: DC
  Administered 2015-04-13 – 2015-04-16 (×7): via TOPICAL
  Filled 2015-04-13: qty 45

## 2015-04-13 MED ORDER — TUBERCULIN PPD 5 UNIT/0.1ML ID SOLN
5.0000 [IU] | Freq: Once | INTRADERMAL | Status: AC
  Administered 2015-04-13: 5 [IU] via INTRADERMAL
  Filled 2015-04-13: qty 0.1

## 2015-04-13 MED ORDER — TOLNAFTATE 1 % EX POWD
Freq: Every day | CUTANEOUS | Status: DC
  Filled 2015-04-13: qty 45

## 2015-04-13 NOTE — Progress Notes (Signed)
[facility]  ROUNDING NOTE   Subjective:   Daughter at bedside.  RIJ permcath placed yesterday. Dr. Wyn Delgado Hemodialysis scheduled for later today.   CT head without ischemic event acutely.   Objective:  Vital signs in last 24 hours:  Temp:  [98.5 F (36.9 C)-99.1 F (37.3 C)] 98.9 F (37.2 C) (10/06 0846) Pulse Rate:  [88-129] 113 (10/06 0846) Resp:  [15-23] 18 (10/06 0846) BP: (125-148)/(61-96) 125/65 mmHg (10/06 0846) SpO2:  [91 %-95 %] 93 % (10/06 0846)  Weight change:  Filed Weights   04/08/15 1540 04/08/15 2034  Weight: 116.574 kg (257 lb) 116.212 kg (256 lb 3.2 oz)    Intake/Output: I/O last 3 completed shifts: In: 480 [P.O.:480] Out: -    Intake/Output this shift:     Physical Exam: General: NAD  Head: Normocephalic, atraumatic. Moist oral mucosal membranes  Eyes: Anicteric, right eye nonreactive  Neck: Supple, trachea midline  Lungs:  Clear to auscultation  Heart: Regular rate and rhythm  Abdomen:  Soft, nontender  Extremities: 1+ peripheral edema  Neurologic: Nonfocal, moving all four extremities  Skin: No lesions  Access: Maturing left forearm AVF, RIJ permcath Dr. Wyn Delgado 10/5    Basic Metabolic Panel:  Recent Labs Lab 04/08/15 1605 04/09/15 0400 04/10/15 1314 04/12/15 1046  NA 144 147* 142 151*  K 5.3* 4.6 4.6 4.7  CL 111 112* 107 115*  CO2 GLUCOSE 129* 102* 141* 166*  BUN 70* 70* 75* 73*  CREATININE 4.08* 3.91* 4.63* 4.54*  CALCIUM 7.8* 8.1* 8.1* 8.4*    Liver Function Tests:  Recent Labs Lab 04/08/15 1605  AST 32  ALT 24  ALKPHOS 87  BILITOT 0.7  PROT 6.9  ALBUMIN 3.0*   No results for input(s): LIPASE, AMYLASE in the last 168 hours. No results for input(s): AMMONIA in the last 168 hours.  CBC:  Recent Labs Lab 04/08/15 1604 04/11/15 0604  WBC 7.7 8.6  NEUTROABS 6.0  --   HGB 9.0* 9.0*  HCT 28.6* 28.9*  MCV 89.2 89.1  PLT 159 166    Cardiac Enzymes:  Recent Labs Lab 04/08/15 1604   TROPONINI 0.03    BNP: Invalid input(s): POCBNP  CBG: No results for input(s): GLUCAP in the last 168 hours.  Microbiology: Results for orders placed or performed during the hospital encounter of 04/08/15  MRSA PCR Screening     Status: None   Collection Time: 04/09/15  2:27 AM  Result Value Ref Range Status   MRSA by PCR NEGATIVE NEGATIVE Final    Comment:        The GeneXpert MRSA Assay (FDA approved for NASAL specimens only), is one component of a comprehensive MRSA colonization surveillance program. It is not intended to diagnose MRSA infection nor to guide or monitor treatment for MRSA infections.     Coagulation Studies: No results for input(s): LABPROT, INR in the last 72 hours.  Urinalysis: No results for input(s): COLORURINE, LABSPEC, PHURINE, GLUCOSEU, HGBUR, BILIRUBINUR, KETONESUR, PROTEINUR, UROBILINOGEN, NITRITE, LEUKOCYTESUR in the last 72 hours.  Invalid input(s): APPERANCEUR    Imaging: Ct Head Wo Contrast  04/12/2015   CLINICAL DATA:  Left-sided weakness.  EXAM: CT HEAD WITHOUT CONTRAST  TECHNIQUE: Contiguous axial images were obtained from the base of the skull through the vertex without intravenous contrast.  COMPARISON:  MRI scan of January 03, 2009.  FINDINGS: Bony calvarium appears intact. Moderate diffuse cortical atrophy is noted. Mild chronic ischemic white matter disease is noted.  No mass effect or midline shift is noted. Ventricular size is within normal limits. There is no evidence of mass lesion, hemorrhage or acute infarction.  IMPRESSION: Moderate diffuse cortical atrophy. Mild chronic ischemic white matter disease. No acute intracranial abnormality seen.   Electronically Signed   By: Sean Delgado, M.D.   On: 04/12/2015 13:39     Medications:     . albuterol  5 mg Nebulization Once  . allopurinol  100 mg Oral Daily  . amoxicillin-clavulanate  1 tablet Oral BID  . aspirin EC  81 mg Oral Daily  . atorvastatin  20 mg Oral Daily  .  brimonidine  1 drop Left Eye BID  . clopidogrel  75 mg Oral Daily  . diltiazem  180 mg Oral Daily  . ferrous sulfate  325 mg Oral BID  . furosemide  40 mg Oral BID  . heparin  5,000 Units Subcutaneous 3 times per day  . Influenza vac split quadrivalent PF  0.5 mL Intramuscular Tomorrow-1000  . isosorbide mononitrate  60 mg Oral Daily  . latanoprost  1 drop Both Eyes QHS  . metoprolol  100 mg Oral 3 times per day  . multivitamin with minerals   Oral Daily  . pregabalin  75 mg Oral BID  . senna-docusate  2 tablet Oral QHS  . sodium bicarbonate  650 mg Oral BID  . sodium chloride  3 mL Intravenous Q12H  . tolnaftate  1 application Topical Daily  . trolamine salicylate  1 application Topical QID  . tuberculin  5 Units Intradermal Once   sodium chloride, acetaminophen, HYDROcodone-acetaminophen, sodium chloride  Assessment/ Plan:  [name]. is a 81 y.o. black male with right eye blindness, diastolic congestive heart failure, hypertension, gout, glaucoma, peripheral neuropathy, atrial fibrillation, anemia, carotid stenosis status post bilateral CEA, CVA, peripheral vascular disease who was admitted to [facility] on 04/08/2015 for shortness of breath.   Follows with [facility]  1. Chronic Kidney Disease stage V: will now label him as End Stage Renal Disease: patient to be started on hemodialysis later today. Initial treatment through RIJ permcath.  - 2 hours, no UF, BFR 200, DFR 300 - Renally dose all medication - holding ACE-I/ARB due to advanced renal disease  2. Hypernatremia: due to overdiuresis with furosemide.  - decreased furosemide to  bid. PO - No indication for free water. Patient to continue diet as per SLP  2. Hypertension: well controlled blood pressures.  - home regimen of imdur, amlodipine, hydralazine, metoprolol, and fursoemide.   3. Anemia of chronic kidney disease: hemoglobin of 9.  - start erythropoietin with HD treatment.  - iron supplement  4.  Secondary Hyperparathyroidism: no PTH or phos available. Calcium at goal.   5. Metabolic Acidosis: carbon dioxide at goal. Buffering with sodium bicarbonate. - will discontinue after starting dialysis.    LOS: 5 Sean Delgado 10/6/201611:03 AM

## 2015-04-13 NOTE — Progress Notes (Signed)
Speech Therapy Note: reviewed chart notes; consulted NSG re: pt's status today. Pt is currently in Dialysis but stated pt has tolerated his diet well w/ no overt s/s of aspiration noted during meal and when taking pills(pt requested to take them w/ OJ this AM per NSG). ST will f/u w/ pt's status next 1-3 days for toleration of diet as nec. MD and NSG updated.

## 2015-04-13 NOTE — Progress Notes (Signed)
[facility] [place] at [facility]   PATIENT NAME: Sean Delgado    MR#:  [id number]  DATE OF BIRTH:  Mar 26, 1934  SUBJECTIVE:  CHIEF COMPLAINT:   Chief Complaint  Patient presents with  . Shortness of Breath  the patient is a 81 year old African-American male with past medical history significant for history of CAD, stroke, peripheral vascular disease, coronary artery disease, multiple other medical problems who presents to the hospital with complaints of acute shortness of breath, nonproductive cough. On arrival to emergency room, he was noted to have right upper lobe pneumonia and admitted to the hospital for further evaluation and treatment. Patient was initiated on Zosyn. However, he got fluid overloaded and his chest x-ray revealed worsening congestive heart failure . His oxygen saturations where low at 89% on oxygen therapy through nasal cannula . Initially. He was somnolent.  ABGs performed on oxygen therapy revealed a pH of 7.33, PCO2 52, patient was initiated on BiPAP, but then transition to oxygen therapy per nasal cannulas only. Patient was receiving IV Lasix with increase of patient's sodium levels concerning for over diuresis. Nephrologist felt that patient needs to be initiated on hemodialysis, so vascular surgery was consulted for IJ placement for hemodialysis. Patient feels satisfactory today. Denies any significant pain or shortness of breath.      Review of Systems  Constitutional: Negative for fever, chills and weight loss.  HENT: Negative for congestion.   Eyes: Negative for blurred vision and double vision.  Respiratory: Positive for cough and shortness of breath. Negative for sputum production and wheezing.   Cardiovascular: Negative for chest pain, palpitations, orthopnea, leg swelling and PND.  Gastrointestinal: Negative for nausea, vomiting, abdominal pain, diarrhea, constipation and blood in stool.  Genitourinary: Negative for dysuria,  urgency, frequency and hematuria.  Musculoskeletal: Negative for falls.  Neurological: Negative for dizziness, tremors, focal weakness and headaches.  Endo/Heme/Allergies: Does not bruise/bleed easily.  Psychiatric/Behavioral: Negative for depression. The patient does not have insomnia.     VITAL SIGNS: Blood pressure 130/75, pulse 104, temperature 98.9 F (37.2 C), temperature source Oral, resp. rate 17, height  (1.854 m), weight 112 kg (246 lb 14.6 oz), SpO2 99 %.  PHYSICAL EXAMINATION:   GENERAL:  81 y.o.-year-old patient sitting in the bed with no acute distress. Alert and comfortable , communicates  Well. Left facial weakness is less pronounced today. Left upper extremity extremity movements are good. Some sluggish strength is 3 out of 5, similar to the right side EYES: Pupils equal, round, reactive to light and accommodation. No scleral icterus. Extraocular muscles intact.  HEENT: Head atraumatic, normocephalic. Oropharynx and nasopharynx clear. Slurring speech NECK:  Supple, no jugular venous distention. No thyroid enlargement, no tenderness.  LUNGS: Some diminished breath sounds bilaterally at bases, no wheezing, rales,rhonchi or crepitation. No use of accessory muscles of respiration.  CARDIOVASCULAR: S1, S2 normal. No murmurs, rubs, or gallops.  ABDOMEN: Soft, nontender, nondistended. Bowel sounds present. No organomegaly or mass.  EXTREMITIES: No pedal edema, cyanosis, or clubbing.  NEUROLOGIC: Cranial nerves . Muscle strength is 5 out of 5 in all 4 extremities Sensation grossly intact. Gait not checked.  Left hand weakness PSYCHIATRIC: The patient is some more alert and oriented , comfortable SKIN: No obvious rash, lesion, or ulcer.   ORDERS/RESULTS REVIEWED:   CBC  Recent Labs Lab 04/08/15 1604 04/11/15 0604 04/13/15 1343  WBC 7.7 8.6 6.5  HGB 9.0* 9.0* 9.1*  HCT 28.6* 28.9* 29.5*  PLT 159 166 154  MCV 89.2 89.1 89.1  MCH 28.0 27.7 27.4  MCHC 31.4* 31.1* 30.7*   RDW 17.0* 16.8* 17.2*  LYMPHSABS 0.6*  --   --   MONOABS 0.7  --   --   EOSABS 0.3  --   --   BASOSABS 0.1  --   --    ------------------------------------------------------------------------------------------------------------------  Chemistries   Recent Labs Lab 04/08/15 1605 04/09/15 0400 04/10/15 1314 04/12/15 1046 04/13/15 1343  NA 144 147* 142 151* 147*  K 5.3* 4.6 4.6 4.7 4.3  CL 111 112* 107 115* 108  CO2 GLUCOSE 129* 102* 141* 166* 164*  BUN 70* 70* 75* 73* 76*  CREATININE 4.08* 3.91* 4.63* 4.54* 4.22*  CALCIUM 7.8* 8.1* 8.1* 8.4* 8.2*  AST 32  --   --   --   --   ALT 24  --   --   --   --   ALKPHOS 87  --   --   --   --   BILITOT 0.7  --   --   --   --    ------------------------------------------------------------------------------------------------------------------ estimated creatinine clearance is 18.9 mL/min (by C-G formula based on Cr of 4.22). ------------------------------------------------------------------------------------------------------------------  Recent Labs  04/11/15 1228  TSH 0.110*    Cardiac Enzymes  Recent Labs Lab 04/08/15 1604  TROPONINI 0.03   ------------------------------------------------------------------------------------------------------------------ Invalid input(s): POCBNP ---------------------------------------------------------------------------------------------------------------  RADIOLOGY: Ct Head Wo Contrast  04/12/2015   CLINICAL DATA:  Left-sided weakness.  EXAM: CT HEAD WITHOUT CONTRAST  TECHNIQUE: Contiguous axial images were obtained from the base of the skull through the vertex without intravenous contrast.  COMPARISON:  MRI scan of January 03, 2009.  FINDINGS: Bony calvarium appears intact. Moderate diffuse cortical atrophy is noted. Mild chronic ischemic white matter disease is noted. No mass effect or midline shift is noted. Ventricular size is within normal limits. There is no evidence of  mass lesion, hemorrhage or acute infarction.  IMPRESSION: Moderate diffuse cortical atrophy. Mild chronic ischemic white matter disease. No acute intracranial abnormality seen.   Electronically Signed   By: Lupita Raider, M.D.   On: 04/12/2015 13:39    EKG:  Orders placed or performed during the hospital encounter of 04/08/15  . EKG 12-Lead  . EKG 12-Lead  . ED EKG  . ED EKG  . EKG 12-Lead  . EKG 12-Lead    ASSESSMENT AND PLAN:  Active Problems:   Acute respiratory failure (HCC) 1. Acute respiratory failure with hypoxia and hypercapnia due to pneumonia and acute on chronic diastolic congestive heart failure,  continue oxygen therapy, keeping pulse oximeter at around 88-92% , also BiPAP intermittently. He would benefit from sleep study as outpatient to rule out obstructive sleep apnea and need for nocturnal CPAP. Continue CPAP at night. [name] most recent chest x-ray revealed worsening CHF, which did not improve even on advanced Lasix. Nephrologist recommended to initiate hemodialysis placing temporary hemodialysis catheter, for which vascular surgery is consulted  2. Acute on chronic diastolic congestive heart failure as above, continue Lasix orally at lower doses due to hypernatremia and overdiuresis , following in's and outs, appreciate nephrologist input , patient is to hemodialysis as soon as temporary hemodialysis catheter is placed by Dr. dew. Echocardiogram done in August 2016 revealed normal ejection fraction but moderate MR.  3. Right upper lobe bacterial pneumonia of unclear bacteriologic agent, off Zosyn. Continue Augmentin as levofloxacin prolongs QTc interval , no reported sputum cultures 4. Dysphagia, questionable aspiration pneumonitis. Appreciate speech  therapist  Recommendations, now on dysphagia 3 diet with thin liquids.  Head CT scan shows no acute stroke, getting brain MRI 5. Hyperkalemia, resolved with therapy 6. Anemia. Get Hemoccult 7. Generalized weakness , getting  physical therapist involved for further recommendations. Likely skilled nursing facility placement, family once the patient to be placed, but patient is refusing discussed with care management 8. A. fib, RVR, advanced Toprol-XL, continue Cardizem CD,   appreciate cardiologist recommendations, including anticoagulation,  initiate a heparin IV/Coumadin versus Coumadin alone therapy when hemodialysis catheter is placed .  9. Left hand and left facial weakness, concerning for acute stroke. CT scan of head with no contrast was unremarkable, getting, MRI the brain  Management plans discussed with the patient, family and they are in agreement.   DRUG ALLERGIES: No Known Allergies  CODE STATUS:     Code Status Orders        Start     Ordered   04/08/15 2032  Full code   Continuous     04/08/15 2031      TOTAL TIME TAKING CARE OF THIS PATIENT: 45 minutes .  Discussed with daughter today for 10 minutes today again  [facility] M.D on 04/13/2015 at 3:23 PM  Between 7am to 6pm - Pager - [phone]  After 6pm go to [email or web address] - password [email or web address] [facility]  [facility]  Office  [phone]  CC: Primary care physician; Bobbye Morton, MD

## 2015-04-13 NOTE — Progress Notes (Signed)
PT Cancellation Note  Patient Details Name: Sean Delgado. MRN: [id number] DOB: Aug 18, 1933   Cancelled Treatment:    Reason Eval/Treat Not Completed: Patient at procedure or test/unavailable. Chart reviewed. Pt out of room for dialysis. Will attempt PT treatment on later time/date as patient is available.  Sharalyn Ink Dachelle Molzahn PT, DPT   Jakya Dovidio 04/13/2015, 2:18 PM

## 2015-04-13 NOTE — Progress Notes (Signed)
Post hd tx 

## 2015-04-13 NOTE — Progress Notes (Signed)
MRI must be done tomorrow as the name of stents placed in patients neck are unknown. Will need to contact medical records tomorrow.

## 2015-04-13 NOTE — Progress Notes (Signed)
HD tx start 

## 2015-04-13 NOTE — Progress Notes (Signed)
Pre-hd tx 

## 2015-04-13 NOTE — Progress Notes (Signed)
patient has not used since 04/10/15, Respiratory status improved

## 2015-04-13 NOTE — Progress Notes (Signed)
HD tx completed.

## 2015-04-13 NOTE — Progress Notes (Signed)
Physical Therapy Treatment Patient Details Name: Sean Delgado. MRN: [id number] DOB: [birth year] Today's Date: 04/13/2015    History of Present Illness Pt is a 81 y.o. male presenting to hospital with SOB.  Pt admitted with acute respiratory failure, R side PNA, and a-fib. PMH:  CAD, htn, h/o CHF, gout, glaucoma, peripheral neuropathy, CKD stage IV. Pt has been at [facility] for STR for the last month due to increasing weakness and SOB. Prior to SNF placement pt was living in independent living apartment at [facility]. Pt receives care through [facility] and has HH aids who help with ADLs/IADLs. Pt endorses 1 fall in the last 12 months.    PT Comments    Pt demonstrates significant lethargy and somnolence during today's session. He closes eyes frequently and requires redirection. Sitting balance is worse today with more frequent posterior loss of balance and heavy cues for active upright sitting. Pt able to perform sit to stand transfer but again has worse anterior weight shifting and poor standing balance. Practiced dynamic sitting balance as well as standing with marching in place. SaO2 monitored during exercise and drops to 88% on room air so pt placed back on 4 L/min O2. Pt will benefit from skilled PT services to address deficits in strength, balance, and mobility in order to return to full function at home.    Follow Up Recommendations  SNF     Equipment Recommendations  None recommended by PT    Recommendations for Other Services       Precautions / Restrictions Precautions Precautions: Fall Restrictions Weight Bearing Restrictions: No    Mobility  Bed Mobility Overal bed mobility: Needs Assistance Bed Mobility: Supine to Sit     Supine to sit: Mod assist     General bed mobility comments: Pt is very lethargic today with poor sequencing and generalized deconditioning evident. Cues for proper hand placement  Transfers Overall transfer level: Needs assistance Equipment  used: Rolling walker (2 wheeled) Transfers: Sit to/from Stand Sit to Stand: Mod assist         General transfer comment: Pt with very poor anterior weight shifting during transfer. Once upright he continues to lean back onto bed requiring therapist assist to prevent falls. Pt provided verbal and tactile cues for anterior weight shifting and standing balance. Performed standing balance with patient with progression from bilateral UE support to no UE support.   Ambulation/Gait     Assistive device: Rolling walker (2 wheeled)       General Gait Details: Unable to attempt on this date due to lethargy and poor standing balance   Stairs            Wheelchair Mobility    Modified Rankin (Stroke Patients Only)       Balance Overall balance assessment: Needs assistance   Sitting balance-Leahy Scale: Fair Sitting balance - Comments: Pt with increased posterior leaning today in sitting with difficulty correcting. Heavy verbal and tactile cues for weight shifting and active sitting. Performed forward reaching with patient alternating UE and crossing midling to challenge balance and encouraged abdominal activation. External perturbations provided to patient to challenge balance and active abdominal and lumbar musculature     Standing balance-Leahy Scale: Poor                      Cognition Arousal/Alertness: Lethargic Behavior During Therapy: Flat affect Overall Cognitive Status: Within Functional Limits for tasks assessed  Exercises General Exercises - Lower Extremity Ankle Circles/Pumps: Strengthening;Both;15 reps;Supine Quad Sets: Strengthening;Both;Supine;15 reps Gluteal Sets: Strengthening;Both;15 reps;Supine Long Arc Quad: Strengthening;Both;15 reps;Seated Heel Slides: Strengthening;Both;15 reps;Seated Straight Leg Raises: Strengthening;Both;10 reps;Supine Hip Flexion/Marching: Strengthening;Both;15 reps;Seated    General  Comments        Pertinent Vitals/Pain      Home Living                      Prior Function            PT Goals (current goals can now be found in the care plan section) Acute Rehab PT Goals Patient Stated Goal: "I want to get stronger" PT Goal Formulation: With patient Time For Goal Achievement: 04/26/15 Potential to Achieve Goals: Fair Progress towards PT goals: Not progressing toward goals - comment (Difficult to assess today due to somnolence)    Frequency  Min 2X/week    PT Plan Current plan remains appropriate    Co-evaluation             End of Session Equipment Utilized During Treatment: Gait belt Activity Tolerance: Patient tolerated treatment well Patient left: with call bell/phone within reach;in bed;with bed alarm set;with nursing/sitter in room     Time: 1635-1701 PT Time Calculation (min) (ACUTE ONLY): 26 min  Charges:  $Therapeutic Exercise: 8-22 mins $Therapeutic Activity: 8-22 mins                    G Codes:      Sharalyn Ink Kary Colaizzi PT, DPT   Jonpaul Lumm 04/13/2015, 5:16 PM

## 2015-04-13 NOTE — Progress Notes (Signed)
Patient is new dialysis. Clinical Child psychotherapist (CSW) contacted [employer] at [facility] and made her aware of above. CSW contacted [facility] social worker April and made her aware of above. Dialysis liaison Selena Batten is working with [facility] on patient's outpatient clinic placement. CSW will continue to follow and assist as needed.   Jetta Lout, LCSWA [phone]

## 2015-04-14 ENCOUNTER — Inpatient Hospital Stay: Payer: Medicare (Managed Care)

## 2015-04-14 ENCOUNTER — Encounter: Payer: Self-pay | Admitting: Vascular Surgery

## 2015-04-14 LAB — RENAL FUNCTION PANEL
Albumin: 2.6 g/dL — ABNORMAL LOW (ref 3.5–5.0)
Anion gap: 5 (ref 5–15)
BUN: 55 mg/dL — AB (ref 6–20)
CHLORIDE: 107 mmol/L (ref 101–111)
CO2: 31 mmol/L (ref 22–32)
CREATININE: 3.33 mg/dL — AB (ref 0.61–1.24)
Calcium: 8 mg/dL — ABNORMAL LOW (ref 8.9–10.3)
GFR calc Af Amer: 19 mL/min — ABNORMAL LOW (ref >60)
GFR calc non Af Amer: 16 mL/min — ABNORMAL LOW (ref >60)
Glucose, Bld: 127 mg/dL — ABNORMAL HIGH (ref 65–99)
Phosphorus: 4.1 mg/dL (ref 2.5–4.6)
Potassium: 4.1 mmol/L (ref 3.5–5.1)
Sodium: 143 mmol/L (ref 135–145)

## 2015-04-14 LAB — HEPATITIS B SURFACE ANTIBODY,QUALITATIVE: HEP B S AB: NONREACTIVE

## 2015-04-14 LAB — HEPATITIS C ANTIBODY: HCV Ab: 0.1 {s_co_ratio} (ref 0.0–0.9)

## 2015-04-14 LAB — CBC
HEMATOCRIT: 31.6 % — AB (ref 40.0–52.0)
Hemoglobin: 9.8 g/dL — ABNORMAL LOW (ref 13.0–18.0)
MCH: 27.6 pg (ref 26.0–34.0)
MCHC: 30.8 g/dL — ABNORMAL LOW (ref 32.0–36.0)
MCV: 89.5 fL (ref 80.0–100.0)
PLATELETS: 171 10*3/uL (ref 150–440)
RBC: 3.53 MIL/uL — ABNORMAL LOW (ref 4.40–5.90)
RDW: 17 % — AB (ref 11.5–14.5)
WBC: 8 10*3/uL (ref 3.8–10.6)

## 2015-04-14 LAB — PROTIME-INR
INR: 0.96
Prothrombin Time: 13 s (ref 11.4–15.0)

## 2015-04-14 LAB — HEPATITIS B SURFACE ANTIGEN: HEP B S AG: NEGATIVE

## 2015-04-14 LAB — HEPATITIS B CORE ANTIBODY, TOTAL: HEP B C TOTAL AB: NEGATIVE

## 2015-04-14 LAB — PARATHYROID HORMONE, INTACT (NO CA): PTH: 209 pg/mL — ABNORMAL HIGH (ref 15–65)

## 2015-04-14 MED ORDER — TECHNETIUM TO 99M ALBUMIN AGGREGATED
4.2000 | Freq: Once | INTRAVENOUS | Status: AC | PRN
  Administered 2015-04-14: 4.2 via INTRAVENOUS

## 2015-04-14 MED ORDER — WARFARIN SODIUM 2.5 MG PO TABS: 2.5000 mg | ORAL_TABLET | Freq: Every day | ORAL | Status: DC

## 2015-04-14 MED ORDER — WARFARIN - PHARMACIST DOSING INPATIENT
Freq: Every day | Status: DC
  Administered 2015-04-17: 19:00:00

## 2015-04-14 MED ORDER — WARFARIN SODIUM 2.5 MG PO TABS
2.5000 mg | ORAL_TABLET | Freq: Every day | ORAL | Status: DC
  Administered 2015-04-14: 2.5 mg via ORAL
  Filled 2015-04-14: qty 1

## 2015-04-14 MED ORDER — AMOXICILLIN-POT CLAVULANATE 500-125 MG PO TABS
1.0000 | ORAL_TABLET | ORAL | Status: DC
  Administered 2015-04-14 – 2015-04-17 (×3): 500 mg via ORAL
  Filled 2015-04-14 (×3): qty 1

## 2015-04-14 MED ORDER — HEPARIN SODIUM (PORCINE) 1000 UNIT/ML IJ SOLN
1000.0000 [IU] | Freq: Once | INTRAMUSCULAR | Status: AC
  Administered 2015-04-14: 3400 [IU]

## 2015-04-14 MED ORDER — TECHNETIUM TC 99M DIETHYLENETRIAME-PENTAACETIC ACID: 52.8550 | Freq: Once | INTRAVENOUS | Status: DC | PRN

## 2015-04-14 MED ORDER — TECHNETIUM TC 99M SULFUR COLLOID
4.2000 | Freq: Once | INTRAVENOUS | Status: DC | PRN
  Filled 2015-04-14 (×2): qty 4.2

## 2015-04-14 MED ORDER — WARFARIN SODIUM 2.5 MG PO TABS: 5.0000 mg | ORAL_TABLET | Freq: Every day | ORAL | Status: DC

## 2015-04-14 NOTE — Progress Notes (Signed)
[facility] [place] [facility]   PATIENT NAME: Sean Delgado    MR#:  [id number]  DATE OF BIRTH:  09/23/1933  SUBJECTIVE:  CHIEF COMPLAINT:   Chief Complaint  Patient presents with  . Shortness of Breath  the patient is a 81 year old African-American male with past medical history significant for history of CAD, stroke, peripheral vascular disease, coronary artery disease, multiple other medical problems who presents to the hospital with complaints of acute shortness of breath, nonproductive cough. On arrival to emergency room, he was noted to have right upper lobe pneumonia and admitted to the hospital for further evaluation and treatment. Patient was initiated on Zosyn. However, he got fluid overloaded and his chest x-ray revealed worsening congestive heart failure . His oxygen saturations where low at 89% on oxygen therapy through nasal cannula . Initially. He was somnolent.  ABGs performed on oxygen therapy revealed a pH of 7.33, PCO2 52, patient was initiated on BiPAP, but then transition to oxygen therapy per nasal cannulas only. Patient was receiving IV Lasix with increase of patient's sodium levels concerning for over diuresis. Nephrologist felt that patient needs to be initiated on hemodialysis, so vascular surgery was consulted for permanent  hemodialysis catheter placement which was done in the right chest. Patient feels satisfactory today. Denies any significant pain or shortness of breath. We'll be starting hemodialysis today.      Review of Systems  Constitutional: Negative for fever, chills and weight loss.  HENT: Negative for congestion.   Eyes: Negative for blurred vision and double vision.  Respiratory: Positive for cough and shortness of breath. Negative for sputum production and wheezing.   Cardiovascular: Negative for chest pain, palpitations, orthopnea, leg swelling and PND.  Gastrointestinal: Negative for nausea, vomiting, abdominal pain,  diarrhea, constipation and blood in stool.  Genitourinary: Negative for dysuria, urgency, frequency and hematuria.  Musculoskeletal: Negative for falls.  Neurological: Negative for dizziness, tremors, focal weakness and headaches.  Endo/Heme/Allergies: Does not bruise/bleed easily.  Psychiatric/Behavioral: Negative for depression. The patient does not have insomnia.     VITAL SIGNS: Blood pressure 134/75, pulse 76, temperature 97.2 F (36.2 C), temperature source Oral, resp. rate 24, height  (1.854 m), weight 112.5 kg (248 lb 0.3 oz), SpO2 96 %.  PHYSICAL EXAMINATION:   GENERAL:  81 y.o.-year-old patient sitting in the bed with no acute distress. Alert and comfortable , communicates  Well. Left facial weakness is less pronounced today. Left upper extremity extremity movements are good. Some sluggish strength is 3 out of 5, similar to the right side EYES: Pupils equal, round, reactive to light and accommodation. No scleral icterus. Extraocular muscles intact.  HEENT: Head atraumatic, normocephalic. Oropharynx and nasopharynx clear. Slurring speech NECK:  Supple, no jugular venous distention. No thyroid enlargement, no tenderness.  LUNGS: Some diminished breath sounds bilaterally at bases, no wheezing, rales,rhonchi or crepitation. No use of accessory muscles of respiration.  CARDIOVASCULAR: S1, S2 normal. No murmurs, rubs, or gallops.  ABDOMEN: Soft, nontender, nondistended. Bowel sounds present. No organomegaly or mass.  EXTREMITIES: No pedal edema, cyanosis, or clubbing.  NEUROLOGIC: Cranial nerves . Muscle strength is 5 out of 5 in all 4 extremities Sensation grossly intact. Gait not checked.   PSYCHIATRIC: The patient is some more alert and oriented , comfortable SKIN: No obvious rash, lesion, or ulcer.   ORDERS/RESULTS REVIEWED:   CBC  Recent Labs Lab 04/08/15 1604 04/11/15 0604 04/13/15 1343 04/14/15 1101  WBC 7.7 8.6 6.5 8.0  HGB  9.0* 9.0* 9.1* 9.8*  HCT 28.6* 28.9*  29.5* 31.6*  PLT 159 166 154 171  MCV 89.2 89.1 89.1 89.5  MCH 28.0 27.7 27.4 27.6  MCHC 31.4* 31.1* 30.7* 30.8*  RDW 17.0* 16.8* 17.2* 17.0*  LYMPHSABS 0.6*  --   --   --   MONOABS 0.7  --   --   --   EOSABS 0.3  --   --   --   BASOSABS 0.1  --   --   --    ------------------------------------------------------------------------------------------------------------------  Chemistries   Recent Labs Lab 04/08/15 1605 04/09/15 0400 04/10/15 1314 04/12/15 1046 04/13/15 1343 04/14/15 1101  NA 144 147* 142 151* 147* 143  K 5.3* 4.6 4.6 4.7 4.3 4.1  CL 111 112* 107 115* 108 107  CO2 GLUCOSE 129* 102* 141* 166* 164* 127*  BUN 70* 70* 75* 73* 76* 55*  CREATININE 4.08* 3.91* 4.63* 4.54* 4.22* 3.33*  CALCIUM 7.8* 8.1* 8.1* 8.4* 8.2* 8.0*  AST 32  --   --   --   --   --   ALT 24  --   --   --   --   --   ALKPHOS 87  --   --   --   --   --   BILITOT 0.7  --   --   --   --   --    ------------------------------------------------------------------------------------------------------------------ estimated creatinine clearance is 24 mL/min (by C-G formula based on Cr of 3.33). ------------------------------------------------------------------------------------------------------------------ No results for input(s): TSH, T4TOTAL, T3FREE, THYROIDAB in the last 72 hours.  Invalid input(s): FREET3  Cardiac Enzymes  Recent Labs Lab 04/08/15 1604  TROPONINI 0.03   ------------------------------------------------------------------------------------------------------------------ Invalid input(s): POCBNP ---------------------------------------------------------------------------------------------------------------  RADIOLOGY: Mr Brain Wo Contrast  04/14/2015   CLINICAL DATA:  81 year old male with respiratory failure, increasing weakness, left side weakness. Initial encounter.  EXAM: MRI HEAD WITHOUT CONTRAST  TECHNIQUE: Multiplanar, multiecho pulse sequences of the  brain and surrounding structures were obtained without intravenous contrast.  COMPARISON:  Head CT without contrast 04/12/2015. Brain MRI 01/03/2009.  FINDINGS: There are several subtle areas of cortical restricted diffusion in the right MCA territory (series 5, image 34) which appear I so to slightly restricted on ADC. On T2 * imaging there is associated chronic micro hemorrhage or petechial hemorrhage (series 11, image 21). Mild if any associated cortical T2 and FLAIR hyperintensity in the right MCA areas implicated on diffusion. No restricted diffusion elsewhere. Major intracranial vascular flow voids are preserved.  Contralateral chronic cortically based infarct in the posterior left MCA territory mostly affecting the left parietal lobe as well as parietal and frontal subcortical white matter. Chronic lacunar infarcts in both thalami, the cerebellar hemispheres, and moderately to severely affecting the brainstem at the pons. These chronic ischemic changes have not significantly progressed since 2010. No other chronic cerebral hemorrhage identified.  No midline shift, mass effect, evidence of mass lesion, ventriculomegaly, extra-axial collection or acute intracranial hemorrhage. Cervicomedullary junction and pituitary are within normal limits. Visible internal auditory structures appear normal. Chronic cervical disc and endplate degeneration. Mastoids and paranasal sinuses are clear. No acute orbit or scalp soft tissue findings. Normal bone marrow signal.  IMPRESSION: 1. Evidence of small subacute infarcts in the right MCA territory with petechial hemorrhage. No mass effect. No definite acute infarct. 2. Chronically advanced underlying left MCA, thalamic, and posterior fossa ischemic disease otherwise appears stable since 2010.   Electronically Signed   By: Rexene Edison  Margo Aye M.D.   On: 04/14/2015 10:45    EKG:  Orders placed or performed during the hospital encounter of 04/08/15  . EKG 12-Lead  . EKG 12-Lead  . ED  EKG  . ED EKG  . EKG 12-Lead  . EKG 12-Lead    ASSESSMENT AND PLAN:  Active Problems:   Acute respiratory failure (HCC) 1. Acute respiratory failure with hypoxia and hypercapnia due to pneumonia and acute on chronic diastolic congestive heart failure,  now starting hemodialysis therapy,  continue oxygen therapy, keeping pulse oximeter at around 88-92% , also BiPAP intermittently. He would benefit from sleep study as outpatient to rule out obstructive sleep apnea and need for nocturnal CPAP. Continue CPAP/BiPAP at night. [name] most recent chest x-ray revealed worsening CHF, which did not improve even on advanced Lasix. Nephrologist recommended to initiate hemodialysis placing permanent hemodialysis catheter, for which vascular surgery is consulted, hemodialysis catheter is placed in the right upper chest, patient will be undergoing hemodialysis session today per nephrologist, he will likely need 3 hemodialysis sessions in the hospital before being transferred to outpatient facility. Following oxygenation, clinically . Getting VQ scan today 2. Acute on chronic diastolic congestive heart failure as above, continue Lasix orally at lower doses due to hypernatremia and overdiuresis , following in's and outs, appreciate nephrologist input , patient is to start hemodialysis today, status post permanent hemodialysis catheter placement in the right upper chest as patient's left upper extremity AV fistula is not mature yet. Echocardiogram done in August 2016 revealed normal ejection fraction but moderate MR.  3. Right upper lobe bacterial pneumonia of unclear bacteriologic agent, off Zosyn. Continue Augmentin , not on levofloxacin as it prolongs QTc interval , no reported sputum cultures 4. Dysphagia, questionable aspiration pneumonitis. Appreciate speech therapist  Recommendations, now on dysphagia 3 diet with thin liquids.  Head CT scan shows no acute stroke, patient is clinically stable at present, we will  not be ordering MRI 5. Hyperkalemia, resolved with therapy 6. Anemia. Get Hemoccult 7. Generalized weakness , getting physical therapist involved for further recommendations. Likely skilled nursing facility placement, family once the patient to be placed, but patient is refusing discussed with care management 8. A. fib, RVR, now on advanced Toprol-XL, continue Cardizem CD,   appreciate cardiologist recommendations, initiate anticoagulation with Coumadin, following patient's pro time closely 9. Left hand and left facial weakness, seen to be resolving now, no acute stroke was seen on CT scan of head with no contrast , patient's symptoms seem to be resolving  Management plans discussed with the patient, PACE program nurse.   DRUG ALLERGIES: No Known Allergies  CODE STATUS:     Code Status Orders        Start     Ordered   04/08/15 2032  Full code   Continuous     04/08/15 2031      TOTAL TIME TAKING CARE OF THIS PATIENT: 40 minutes .  Discussed with Dr. Wynelle Link and PACE program nurse  Chrishun Scheer M.D on 04/14/2015 at 1:39 PM  Between 7am to 6pm - Pager - [phone]  After 6pm go to [email or web address] - password [email or web address] [facility]  [place] [facility]  Office  [phone]  CC: Primary care physician; Bobbye Morton, MD

## 2015-04-14 NOTE — Progress Notes (Signed)
Nutrition Follow-up   INTERVENTION:   Meals and Snacks: Cater to patient preferences on current diet order. Per RN Caryl Asp pt tolerating current diet order very well this am. Pt requires assistance with set-up.  Medical Food Supplement Therapy: will recommend nepro on follow if intake poor   NUTRITION DIAGNOSIS:   Swallowing difficulty related to dysphagia as evidenced by  (thickened liquids, SLP following).  GOAL:   Patient will meet greater than or equal to 90% of their needs  MONITOR:    (Energy Intake, Anthropometrics, Digestive System)  REASON FOR ASSESSMENT:    (Thickened liquids)    ASSESSMENT:   Pt s/p permcath placement 10/5 and HD initiated yesterday. Pt scheduled for MRI this and second HD treatment today. Pt out of room this am on visit times 2.  Diet Order:  DIET DYS 3 Room service appropriate?: Yes; Fluid consistency:: Thin    Current Nutrition: Per RN Caryl Asp pt ate 100% of breakfast this am with CNA. Per recorded po intake >75% of meals.   Gastrointestinal Profile: Last BM: 04/14/2015   Medications: ferrous sulfate, lasix, MVI, sodium bicarbonate  Electrolyte/Renal Profile and Glucose Profile:   Recent Labs Lab 04/10/15 1314 04/12/15 1046 04/13/15 1343  NA 142 151* 147*  K 4.6 4.7 4.3  CL 107 115* 108  CO2 BUN 75* 73* 76*  CREATININE 4.63* 4.54* 4.22*  CALCIUM 8.1* 8.4* 8.2*  PHOS  --   --  4.6  GLUCOSE 141* 166* 164*   Protein Profile:  Recent Labs Lab 04/08/15 1605 04/13/15 1343  ALBUMIN 3.0* 2.6*     Weight Trend since Admission: Filed Weights   04/13/15 1509 04/14/15 0343 04/14/15 1050  Weight: 246 lb 14.6 oz (112 kg) 245 lb 3.2 oz (111.222 kg) 248 lb 0.3 oz (112.5 kg)     BMI:  Body mass index is 32.73 kg/(m^2).   Estimated Nutritional Needs:   Kcal:  2115-2503kcals, BEE: 1444kcals, TEE: (IF 1.1-1.3)(AF 1.2) using IBW of 83.6kg  Protein:  84-100g protein (1.0-1.2g/kg) using IBW of 83.6kg  Fluid:   2090-2535mL of fluid (25-76mL/kg) uwing IBW of 83.6kg  EDUCATION NEEDS:   Education needs no appropriate at this time   MODERATE Care Level  Leda Quail, RD, LDN Pager [phone]

## 2015-04-14 NOTE — Progress Notes (Addendum)
ANTICOAGULATION CONSULT NOTE - Initial Consult  Pharmacy Consult for Warfarin Indication: atrial fibrillation  No Known Allergies  Patient Measurements: Height:  (185.4 cm) Weight: 248 lb 0.3 oz (112.5 kg) IBW/kg (Calculated) : 79.9  Vital Signs: Temp: 97.2 F (36.2 C) (10/07 1320) Temp Source: Oral (10/07 1320) BP: 134/75 mmHg (10/07 1328) Pulse Rate: 76 (10/07 1328)  Labs:  Recent Labs  04/12/15 1046 04/13/15 1343 04/14/15 1101  HGB  --  9.1* 9.8*  HCT  --  29.5* 31.6*  PLT  --  154 171  CREATININE 4.54* 4.22* 3.33*    Estimated Creatinine Clearance: 24 mL/min (by C-G formula based on Cr of 3.33).   Medical History: Past Medical History  Diagnosis Date  . Chronic renal failure, stage 4 (severe) (HCC)   . Hypertension   . CHF (congestive heart failure) (HCC)   . Gout   . Glaucoma   . Neuropathy (HCC)   . Stroke (HCC)   . Peripheral vascular disease (HCC)   . Coronary artery disease   . A-fib (HCC)   . Dysrhythmia   . Blindness of right eye   . Anemia     iron deficiency    Medications:  Scheduled:  . albuterol  5 mg Nebulization Once  . allopurinol  100 mg Oral Daily  . amoxicillin-clavulanate  1 tablet Oral Q24H  . aspirin EC  81 mg Oral Daily  . atorvastatin  20 mg Oral Daily  . brimonidine  1 drop Left Eye BID  . clopidogrel  75 mg Oral Daily  . diltiazem  180 mg Oral Daily  . ferrous sulfate  325 mg Oral BID  . furosemide  40 mg Oral BID  . heparin  5,000 Units Subcutaneous 3 times per day  . Influenza vac split quadrivalent PF  0.5 mL Intramuscular Tomorrow-1000  . isosorbide mononitrate  60 mg Oral Daily  . latanoprost  1 drop Both Eyes QHS  . metoprolol  100 mg Oral 3 times per day  . multivitamin with minerals   Oral Daily  . pregabalin  75 mg Oral BID  . senna-docusate  2 tablet Oral QHS  . sodium bicarbonate  650 mg Oral BID  . sodium chloride  3 mL Intravenous Q12H  . tolnaftate   Topical BID  . trolamine salicylate  1  application Topical QID  . tuberculin  5 Units Intradermal Once  . warfarin  5 mg Oral q1800    Assessment: Pharmacy consulted to initiate warfarin in a 81 yo male with afib.  Patient also has orders for heparin 5000 units subq q8h.   Baseline INR pending   Goal of Therapy:  INR 2-3 Monitor CBC per policy   Plan:  INR pending.  MD ordered warfarin 5 mg po daily. If baseline not elevated, will transition patient to warfarin 2.5 mg daily.  Of note, patient is receiving Augmentin 500 mg po daily.  Per notes, patient with anemia and also receiving aspirin and clopidogrel therapy.  Will follow INR closely.  INR ordered in AM.   Pharmacy will continue to follow.  Nerissa Constantin G 04/14/2015,2:26 PM

## 2015-04-14 NOTE — Care Management Important Message (Signed)
Important Message  Patient Details  Name: Sean Delgado. MRN: [id number] Date of Birth: 1933-10-15   Medicare Important Message Given:  Yes-fourth notification given    Collie Siad, RN 04/14/2015, 8:26 AM

## 2015-04-14 NOTE — Progress Notes (Signed)
[facility]  ROUNDING NOTE   Subjective:   Second hemodialysis today. Tolerating treatment well. First hemodialysis yesterday on 04/13/15.  Currently through [facility].   Objective:  Vital signs in last 24 hours:  Temp:  [97.7 F (36.5 C)-98.9 F (37.2 C)] 98 F (36.7 C) (10/07 0732) Pulse Rate:  [67-115] 103 (10/07 0732) Resp:  [15-22] 16 (10/07 0732) BP: (111-145)/(57-91) 136/57 mmHg (10/07 0732) SpO2:  [90 %-99 %] 96 % (10/07 0732) Weight:  [111.222 kg (245 lb 3.2 oz)-112 kg (246 lb 14.6 oz)] 111.222 kg (245 lb 3.2 oz) (10/07 0343)  Weight change:  Filed Weights   04/13/15 1245 04/13/15 1509 04/14/15 0343  Weight: 112 kg (246 lb 14.6 oz) 112 kg (246 lb 14.6 oz) 111.222 kg (245 lb 3.2 oz)    Intake/Output: I/O last 3 completed shifts: In: 800 [P.O.:800] Out: 200 [Urine:200]   Intake/Output this shift:  Total I/O In: 240 [P.O.:240] Out: -   Physical Exam: General: NAD  Head: Normocephalic, atraumatic. Moist oral mucosal membranes  Eyes: Anicteric, right eye nonreactive  Neck: Supple, trachea midline  Lungs:  Clear to auscultation  Heart: Regular rate and rhythm  Abdomen:  Soft, nontender  Extremities: 1+ peripheral edema  Neurologic: Nonfocal, moving all four extremities  Skin: No lesions  Access: Maturing left forearm AVF, RIJ permcath Dr. Wyn Quaker 10/5    Basic Metabolic Panel:  Recent Labs Lab 04/08/15 1605 04/09/15 0400 04/10/15 1314 04/12/15 1046 04/13/15 1343  NA 144 147* 142 151* 147*  K 5.3* 4.6 4.6 4.7 4.3  CL 111 112* 107 115* 108  CO2 GLUCOSE 129* 102* 141* 166* 164*  BUN 70* 70* 75* 73* 76*  CREATININE 4.08* 3.91* 4.63* 4.54* 4.22*  CALCIUM 7.8* 8.1* 8.1* 8.4* 8.2*  PHOS  --   --   --   --  4.6    Liver Function Tests:  Recent Labs Lab 04/08/15 1605 04/13/15 1343  AST 32  --   ALT 24  --   ALKPHOS 87  --   BILITOT 0.7  --   PROT 6.9  --   ALBUMIN 3.0* 2.6*   No results for input(s): LIPASE,  AMYLASE in the last 168 hours. No results for input(s): AMMONIA in the last 168 hours.  CBC:  Recent Labs Lab 04/08/15 1604 04/11/15 0604 04/13/15 1343  WBC 7.7 8.6 6.5  NEUTROABS 6.0  --   --   HGB 9.0* 9.0* 9.1*  HCT 28.6* 28.9* 29.5*  MCV 89.2 89.1 89.1  PLT 159 166 154    Cardiac Enzymes:  Recent Labs Lab 04/08/15 1604  TROPONINI 0.03    BNP: Invalid input(s): POCBNP  CBG: No results for input(s): GLUCAP in the last 168 hours.  Microbiology: Results for orders placed or performed during the hospital encounter of 04/08/15  MRSA PCR Screening     Status: None   Collection Time: 04/09/15  2:27 AM  Result Value Ref Range Status   MRSA by PCR NEGATIVE NEGATIVE Final    Comment:        The GeneXpert MRSA Assay (FDA approved for NASAL specimens only), is one component of a comprehensive MRSA colonization surveillance program. It is not intended to diagnose MRSA infection nor to guide or monitor treatment for MRSA infections.     Coagulation Studies: No results for input(s): LABPROT, INR in the last 72 hours.  Urinalysis: No results for input(s): COLORURINE, LABSPEC, PHURINE, GLUCOSEU, HGBUR, BILIRUBINUR, KETONESUR, PROTEINUR,  UROBILINOGEN, NITRITE, LEUKOCYTESUR in the last 72 hours.  Invalid input(s): APPERANCEUR    Imaging: Ct Head Wo Contrast  04/12/2015   CLINICAL DATA:  Left-sided weakness.  EXAM: CT HEAD WITHOUT CONTRAST  TECHNIQUE: Contiguous axial images were obtained from the base of the skull through the vertex without intravenous contrast.  COMPARISON:  MRI scan of January 03, 2009.  FINDINGS: Bony calvarium appears intact. Moderate diffuse cortical atrophy is noted. Mild chronic ischemic white matter disease is noted. No mass effect or midline shift is noted. Ventricular size is within normal limits. There is no evidence of mass lesion, hemorrhage or acute infarction.  IMPRESSION: Moderate diffuse cortical atrophy. Mild chronic ischemic white matter  disease. No acute intracranial abnormality seen.   Electronically Signed   By: Lupita Raider, M.D.   On: 04/12/2015 13:39   Mr Brain Wo Contrast  04/14/2015   CLINICAL DATA:  81 year old male with respiratory failure, increasing weakness, left side weakness. Initial encounter.  EXAM: MRI HEAD WITHOUT CONTRAST  TECHNIQUE: Multiplanar, multiecho pulse sequences of the brain and surrounding structures were obtained without intravenous contrast.  COMPARISON:  Head CT without contrast 04/12/2015. Brain MRI 01/03/2009.  FINDINGS: There are several subtle areas of cortical restricted diffusion in the right MCA territory (series 5, image 34) which appear I so to slightly restricted on ADC. On T2 * imaging there is associated chronic micro hemorrhage or petechial hemorrhage (series 11, image 21). Mild if any associated cortical T2 and FLAIR hyperintensity in the right MCA areas implicated on diffusion. No restricted diffusion elsewhere. Major intracranial vascular flow voids are preserved.  Contralateral chronic cortically based infarct in the posterior left MCA territory mostly affecting the left parietal lobe as well as parietal and frontal subcortical white matter. Chronic lacunar infarcts in both thalami, the cerebellar hemispheres, and moderately to severely affecting the brainstem at the pons. These chronic ischemic changes have not significantly progressed since 2010. No other chronic cerebral hemorrhage identified.  No midline shift, mass effect, evidence of mass lesion, ventriculomegaly, extra-axial collection or acute intracranial hemorrhage. Cervicomedullary junction and pituitary are within normal limits. Visible internal auditory structures appear normal. Chronic cervical disc and endplate degeneration. Mastoids and paranasal sinuses are clear. No acute orbit or scalp soft tissue findings. Normal bone marrow signal.  IMPRESSION: 1. Evidence of small subacute infarcts in the right MCA territory with  petechial hemorrhage. No mass effect. No definite acute infarct. 2. Chronically advanced underlying left MCA, thalamic, and posterior fossa ischemic disease otherwise appears stable since 2010.   Electronically Signed   By: Odessa Fleming M.D.   On: 04/14/2015 10:45     Medications:     . albuterol  5 mg Nebulization Once  . allopurinol  100 mg Oral Daily  . amoxicillin-clavulanate  1 tablet Oral BID  . aspirin EC  81 mg Oral Daily  . atorvastatin  20 mg Oral Daily  . brimonidine  1 drop Left Eye BID  . clopidogrel  75 mg Oral Daily  . diltiazem  180 mg Oral Daily  . ferrous sulfate  325 mg Oral BID  . furosemide  40 mg Oral BID  . heparin  5,000 Units Subcutaneous 3 times per day  . Influenza vac split quadrivalent PF  0.5 mL Intramuscular Tomorrow-1000  . isosorbide mononitrate  60 mg Oral Daily  . latanoprost  1 drop Both Eyes QHS  . metoprolol  100 mg Oral 3 times per day  . multivitamin with minerals  Oral Daily  . pregabalin  75 mg Oral BID  . senna-docusate  2 tablet Oral QHS  . sodium bicarbonate  650 mg Oral BID  . sodium chloride  3 mL Intravenous Q12H  . tolnaftate   Topical BID  . trolamine salicylate  1 application Topical QID  . tuberculin  5 Units Intradermal Once   sodium chloride, acetaminophen, HYDROcodone-acetaminophen, sodium chloride  Assessment/ Plan:  [name]. is a 81 y.o. black male with right eye blindness, diastolic congestive heart failure, hypertension, gout, glaucoma, peripheral neuropathy, atrial fibrillation, anemia, carotid stenosis status post bilateral CEA, CVA, peripheral vascular disease who was admitted to [facility] on 04/08/2015 for shortness of breath.   Follows with [facility]  1. End Stage Renal Disease: patient  started on hemodialysis 10/6: second treatment today. Seen and examined on hemodialysis today. Initial treatment through RIJ permcath. AVF maturing.  - Renally dose all medication - holding ACE-I/ARB due to advanced renal  disease. Will need to restart.   2. Hypernatremia: due to overdiuresis with furosemide. Improved to 147 - decreased furosemide to  bid. PO - No indication for free water.   3. Hypertension: well controlled blood pressures.  - home regimen of imdur, amlodipine, hydralazine, metoprolol, and furosemide.   4. Anemia of chronic kidney disease: hemoglobin of 9.1 - started erythropoietin with HD treatment.  - iron supplement  5. Secondary Hyperparathyroidism: PTH 209. Phos 4.6. Calcium 8.2  Not currently on binders.    LOS: 6 Sean Delgado 10/7/201611:11 AM

## 2015-04-14 NOTE — Progress Notes (Signed)
PT Cancellation Note  Patient Details NameHarnoor Kohlesay Jr. MRN: [id number] DOB: 06-Oct-1933   Cancelled Treatment:    Reason Eval/Treat Not Completed: Patient at procedure or test/unavailable (dialysis) Will try back later per PT/pt schedule after dialysis.  [name] 04/14/2015, 1:18 PM

## 2015-04-14 NOTE — Progress Notes (Signed)
ANTICOAGULATION CONSULT NOTE - Initial Consult  Pharmacy Consult for Warfarin Indication: atrial fibrillation  No Known Allergies  Patient Measurements: Height:  (185.4 cm) Weight: 248 lb 0.3 oz (112.5 kg) IBW/kg (Calculated) : 79.9  Vital Signs: Temp: 98.3 F (36.8 C) (10/07 1535) Temp Source: Oral (10/07 1535) BP: 133/45 mmHg (10/07 1535) Pulse Rate: 56 (10/07 1535)  Labs:  Recent Labs  04/12/15 1046 04/13/15 1343 04/14/15 1101 04/14/15 1525  HGB  --  9.1* 9.8*  --   HCT  --  29.5* 31.6*  --   PLT  --  154 171  --   LABPROT  --   --   --  13.0  INR  --   --   --  0.96  CREATININE 4.54* 4.22* 3.33*  --     Estimated Creatinine Clearance: 24 mL/min (by C-G formula based on Cr of 3.33).   Medical History: Past Medical History  Diagnosis Date  . Chronic renal failure, stage 4 (severe) (HCC)   . Hypertension   . CHF (congestive heart failure) (HCC)   . Gout   . Glaucoma   . Neuropathy (HCC)   . Stroke (HCC)   . Peripheral vascular disease (HCC)   . Coronary artery disease   . A-fib (HCC)   . Dysrhythmia   . Blindness of right eye   . Anemia     iron deficiency    Medications:  Scheduled:  . albuterol  5 mg Nebulization Once  . allopurinol  100 mg Oral Daily  . amoxicillin-clavulanate  1 tablet Oral Q24H  . aspirin EC  81 mg Oral Daily  . atorvastatin  20 mg Oral Daily  . brimonidine  1 drop Left Eye BID  . diltiazem  180 mg Oral Daily  . ferrous sulfate  325 mg Oral BID  . furosemide  40 mg Oral BID  . heparin  5,000 Units Subcutaneous 3 times per day  . Influenza vac split quadrivalent PF  0.5 mL Intramuscular Tomorrow-1000  . isosorbide mononitrate  60 mg Oral Daily  . latanoprost  1 drop Both Eyes QHS  . metoprolol  100 mg Oral 3 times per day  . multivitamin with minerals   Oral Daily  . pregabalin  75 mg Oral BID  . senna-docusate  2 tablet Oral QHS  . sodium bicarbonate  650 mg Oral BID  . sodium chloride  3 mL Intravenous Q12H  .  tolnaftate   Topical BID  . trolamine salicylate  1 application Topical QID  . tuberculin  5 Units Intradermal Once  . warfarin  2.5 mg Oral q1800  . Warfarin - Pharmacist Dosing Inpatient   Does not apply q1800    Assessment: Pharmacy consulted to initiate warfarin in a 81 yo male with afib.  Patient also has orders for heparin 5000 units subq q8h.   Baseline INR pending   Goal of Therapy:  INR 2-3 Monitor CBC per policy   Plan:  INR pending.  MD ordered warfarin 5 mg po daily. If baseline not elevated, will transition patient to warfarin 2.5 mg daily.  Of note, patient is receiving Augmentin 500 mg po daily.  Per notes, patient with anemia and also receiving aspirin and clopidogrel therapy.  Will follow INR closely.  INR ordered in AM.   10/07:   INR @ 16:00 = 0.96 Will proceed with current scheduled dose of Warfarin 2.5 mg PO daily.   Pharmacy will continue to follow.  [place]qua Kretz D  04/14/2015,4:15 PM

## 2015-04-14 NOTE — Care Management Note (Signed)
I sent all required records records to [facility] Thursday afternoon.  I reached out to the clinic and let them know to be expecting a new referral.  I also spoke with Faye Ramsay at [facility] and she informed me that she would be my primary point of contact.  During our conversation she informed me that they are only contracted with [facility] on [address].  I have left my contact information and education material in the patients room for his family.   Ivor Reining Dialysis Liaison  [phone]

## 2015-04-14 NOTE — Progress Notes (Signed)
Per MD patient will not be ready for D/C over the weekend. Clinical Child psychotherapist (CSW) made [employer] at [facility] aware of above. CSW also contacted [facility] social worker April and made her aware of above. Patient will go to [facility] on [address] for Dialysis. CSW will continue to follow and assist as needed.   Jetta Lout, LCSWA [phone]

## 2015-04-15 LAB — PROTIME-INR
INR: 1.05
Prothrombin Time: 13.9 seconds (ref 11.4–15.0)

## 2015-04-15 LAB — CBC
HCT: 29.4 % — ABNORMAL LOW (ref 40.0–52.0)
HEMOGLOBIN: 9 g/dL — AB (ref 13.0–18.0)
MCH: 27.3 pg (ref 26.0–34.0)
MCHC: 30.7 g/dL — ABNORMAL LOW (ref 32.0–36.0)
MCV: 88.9 fL (ref 80.0–100.0)
Platelets: 149 10*3/uL — ABNORMAL LOW (ref 150–440)
RBC: 3.31 MIL/uL — AB (ref 4.40–5.90)
RDW: 17.5 % — ABNORMAL HIGH (ref 11.5–14.5)
WBC: 7.2 10*3/uL (ref 3.8–10.6)

## 2015-04-15 LAB — RENAL FUNCTION PANEL
ANION GAP: 6 (ref 5–15)
Albumin: 2.5 g/dL — ABNORMAL LOW (ref 3.5–5.0)
BUN: 44 mg/dL — ABNORMAL HIGH (ref 6–20)
CHLORIDE: 103 mmol/L (ref 101–111)
CO2: 32 mmol/L (ref 22–32)
Calcium: 8.1 mg/dL — ABNORMAL LOW (ref 8.9–10.3)
Creatinine, Ser: 3.32 mg/dL — ABNORMAL HIGH (ref 0.61–1.24)
GFR calc non Af Amer: 16 mL/min — ABNORMAL LOW (ref >60)
GFR, EST AFRICAN AMERICAN: 19 mL/min — AB (ref >60)
Glucose, Bld: 148 mg/dL — ABNORMAL HIGH (ref 65–99)
POTASSIUM: 4.7 mmol/L (ref 3.5–5.1)
Phosphorus: 4.8 mg/dL — ABNORMAL HIGH (ref 2.5–4.6)
Sodium: 141 mmol/L (ref 135–145)

## 2015-04-15 MED ORDER — EPOETIN ALFA 10000 UNIT/ML IJ SOLN
10000.0000 [IU] | Freq: Once | INTRAMUSCULAR | Status: AC
  Administered 2015-04-15: 10000 [IU] via INTRAVENOUS

## 2015-04-15 MED ORDER — FUROSEMIDE 10 MG/ML IJ SOLN
40.0000 mg | Freq: Once | INTRAMUSCULAR | Status: AC
  Administered 2015-04-15: 40 mg via INTRAVENOUS
  Filled 2015-04-15: qty 4

## 2015-04-15 MED ORDER — HEPARIN SODIUM (PORCINE) 1000 UNIT/ML IJ SOLN
1000.0000 [IU] | Freq: Once | INTRAMUSCULAR | Status: AC
  Administered 2015-04-15: 3400 [IU]

## 2015-04-15 NOTE — Progress Notes (Signed)
[facility] [place] [facility]   PATIENT NAME: Sean Delgado    MR#:  [id number]  DATE OF BIRTH:  [birth year]  SUBJECTIVE:  CHIEF COMPLAINT:   Chief Complaint  Patient presents with  . Shortness of Breath   - Still complains of dyspnea, remains on 4-5 L of oxygen. -Third session of hemodialysis today.  REVIEW OF SYSTEMS:  Review of Systems  Constitutional: Negative for fever and chills.  HENT: Negative for ear discharge, ear pain and nosebleeds.   Eyes: Negative for blurred vision and double vision.       Right eye blindness  Respiratory: Positive for shortness of breath. Negative for cough and wheezing.   Cardiovascular: Positive for orthopnea and leg swelling. Negative for chest pain, palpitations and PND.  Gastrointestinal: Negative for nausea, vomiting, abdominal pain, diarrhea and constipation.  Genitourinary: Negative for dysuria.  Neurological: Negative for dizziness, sensory change, speech change, seizures, weakness and headaches.  Psychiatric/Behavioral: Negative for depression.    DRUG ALLERGIES:  No Known Allergies  VITALS:  Blood pressure 136/71, pulse 117, temperature 99.4 F (37.4 C), temperature source Oral, resp. rate 18, height 6\' 1"  (1.854 m), weight 111.585 kg (246 lb), SpO2 97 %.  PHYSICAL EXAMINATION:  Physical Exam  GENERAL:  81 y.o.-year-old patient lying in the bed with no acute distress.  EYES: Pupils equal, round, reactive to light and accommodation. No scleral icterus. Extraocular muscles intact. Patient is legally blind in right eye HEENT: Head atraumatic, normocephalic. Oropharynx and nasopharynx clear.  NECK:  Supple, no jugular venous distention. No thyroid enlargement, no tenderness.  LUNGS: Normal breath sounds bilaterally, decreased bibasilar breath sounds. Bibasilar crackles heard. no wheezing, or rhonchi. use of accessory muscles of respiration on exertion noted.  CARDIOVASCULAR: S1, S2 normal. No  rubs, or  gallops. 3/6 systolic murmur present ABDOMEN: Soft, nontender, nondistended. Bowel sounds present. No organomegaly or mass.  EXTREMITIES: No cyanosis, or clubbing. 1+ pedal edema noted NEUROLOGIC: Cranial nerves II through XII are intact. Muscle strength 5/5 in all extremities. Sensation intact. Gait not checked.  PSYCHIATRIC: The patient is alert and oriented x 2-3.  SKIN: No obvious rash, lesion, or ulcer.    LABORATORY PANEL:   CBC  Recent Labs Lab 04/14/15 1101  WBC 8.0  HGB 9.8*  HCT 31.6*  PLT 171   ------------------------------------------------------------------------------------------------------------------  Chemistries   Recent Labs Lab 04/08/15 1605  04/14/15 1101  NA 144  < > 143  K 5.3*  < > 4.1  CL 111  < > 107  CO2 22  < > 31  GLUCOSE 129*  < > 127*  BUN 70*  < > 55*  CREATININE 4.08*  < > 3.33*  CALCIUM 7.8*  < > 8.0*  AST 32  --   --   ALT 24  --   --   ALKPHOS 87  --   --   BILITOT 0.7  --   --   < > = values in this interval not displayed. ------------------------------------------------------------------------------------------------------------------  Cardiac Enzymes  Recent Labs Lab 04/08/15 1604  TROPONINI 0.03   ------------------------------------------------------------------------------------------------------------------  RADIOLOGY:  Mr Brain Wo Contrast  04/14/2015   CLINICAL DATA:  81 year old male with respiratory failure, increasing weakness, left side weakness. Initial encounter.  EXAM: MRI HEAD WITHOUT CONTRAST  TECHNIQUE: Multiplanar, multiecho pulse sequences of the brain and surrounding structures were obtained without intravenous contrast.  COMPARISON:  Head CT without contrast 04/12/2015. Brain MRI 01/03/2009.  FINDINGS: There are several subtle areas of  cortical restricted diffusion in the right MCA territory (series 5, image 34) which appear I so to slightly restricted on ADC. On T2 * imaging there is associated chronic  micro hemorrhage or petechial hemorrhage (series 11, image 21). Mild if any associated cortical T2 and FLAIR hyperintensity in the right MCA areas implicated on diffusion. No restricted diffusion elsewhere. Major intracranial vascular flow voids are preserved.  Contralateral chronic cortically based infarct in the posterior left MCA territory mostly affecting the left parietal lobe as well as parietal and frontal subcortical white matter. Chronic lacunar infarcts in both thalami, the cerebellar hemispheres, and moderately to severely affecting the brainstem at the pons. These chronic ischemic changes have not significantly progressed since 2010. No other chronic cerebral hemorrhage identified.  No midline shift, mass effect, evidence of mass lesion, ventriculomegaly, extra-axial collection or acute intracranial hemorrhage. Cervicomedullary junction and pituitary are within normal limits. Visible internal auditory structures appear normal. Chronic cervical disc and endplate degeneration. Mastoids and paranasal sinuses are clear. No acute orbit or scalp soft tissue findings. Normal bone marrow signal.  IMPRESSION: 1. Evidence of small subacute infarcts in the right MCA territory with petechial hemorrhage. No mass effect. No definite acute infarct. 2. Chronically advanced underlying left MCA, thalamic, and posterior fossa ischemic disease otherwise appears stable since 2010.   Electronically Signed   By: Odessa Fleming M.D.   On: 04/14/2015 10:45   Nm Pulmonary Perf And Vent  04/14/2015   CLINICAL DATA:  Shortness of breath acute onset.  EXAM: NUCLEAR MEDICINE VENTILATION - PERFUSION LUNG SCAN  TECHNIQUE: Ventilation images were obtained in multiple projections using inhaled aerosol Tc-25m DTPA. Perfusion images were obtained in multiple projections after intravenous injection of Tc-80m MAA.  RADIOPHARMACEUTICALS:  42.855 mCi Technetium-50m DTPA aerosol inhalation and 4.2 Technetium-24m MAA IV  COMPARISON:  Chest  radiography same day.  FINDINGS: Ventilation: Patchy ventilation defects at both lung bases.  Perfusion: Slightly diminished perfusion of the bases consistent with basilar infiltrates/ atelectasis. No clear cut perfusion defects to suggest emboli.  IMPRESSION: Low probability of pulmonary emboli. Matched ventilation-perfusion abnormalities at the lung bases most consistent with atelectasis/ pneumonia.   Electronically Signed   By: Paulina Fusi M.D.   On: 04/14/2015 19:53   Dg Chest Port 1 View  04/14/2015   CLINICAL DATA:  Productive cough  EXAM: PORTABLE CHEST 1 VIEW  COMPARISON:  April 10, 2015  FINDINGS: There is patchy infiltrate in each mid lung and right base regions, stable. There is a minimal left effusion. No new opacity. Heart is mildly enlarged with pulmonary vascularity within normal limits. Central catheter tip is at the cavoatrial junction. No pneumothorax. No adenopathy.  IMPRESSION: Stable patchy infiltrate in both mid lung regions and right base. Small left effusion. No new opacity. No change in cardiac silhouette. The overall appearance is felt to represent a degree of congestive heart failure, with the areas of infiltrate likely representing alveolar edema. Pneumonia, however, cannot be excluded in one or more of these areas. Central catheter as described without pneumothorax.   Electronically Signed   By: Bretta Bang III M.D.   On: 04/14/2015 15:37    EKG:   Orders placed or performed during the hospital encounter of 04/08/15  . EKG 12-Lead  . EKG 12-Lead  . ED EKG  . ED EKG  . EKG 12-Lead  . EKG 12-Lead    ASSESSMENT AND PLAN:   81 year old African-American male with past medical history significant for coronary artery disease, CVA, peripheral  vascular disease, CK D admitted to the hospital secondary to dyspnea and acute on chronic renal failure  #1 acute respiratory failure-secondary to acute on chronic asked Ali congestive heart failure exacerbation. -Chest x-ray  with significant pulmonary edema. -Continue Lasix. -Also on dialysis. -Required BiPAP in the past, currently stable on 5 L oxygen. Significantly fluid overloaded. -We'll need sleep study as outpatient  #2 pneumonia-currently on Augmentin. Improving. Follow-up chest x-ray in 2-3 days.  #3 dysphagia-appreciate speech therapy input. Patient currently on dysphagia diet with thin liquids. -CT of the head without any acute stroke noted.  #4 end-stage renal disease-AV fistula on left forearm placed as outpatient. Since acutely needs dialysis, right IJ permacath placed. -Appreciate nephrology and vascular consults. -Continue to hold Ace-I and ARB at this time. -Started on hemodialysis and received 2 treatments. For third treatment today. -Outpatient dialysis with [facility] nephrology.  #5 hypertension-on Imdur, metoprolol and Cardizem  #6 atrial fibrillation-rate is controlled with metoprolol and Cardizem. -On warfarin. Follow-up INR and pharmacy adjusting dose  #7 DVT prophylaxis-subcutaneous heparin   All the records are reviewed and case discussed with Care Management/Social Workerr. Management plans discussed with the patient, family and they are in agreement.  CODE STATUS: Full Code   TOTAL TIME TAKING CARE OF THIS PATIENT: 32 minutes.   POSSIBLE D/C IN 2 DAYS, DEPENDING ON CLINICAL CONDITION.   Merlene Dante M.D on 04/15/2015 at 12:43 PM  Between 7am to 6pm - Pager - [phone]  After 6pm go to [email or web address] - password [email or web address] [facility]  [facility]  Office  [phone]  CC: Primary care physician; Bobbye Morton, MD

## 2015-04-15 NOTE — Progress Notes (Deleted)
Speech Language Pathology Treatment: Dysphagia  Patient Details Name: Sean Delgado. MRN: [id number] DOB: 1934/05/24 Today's Date: 04/15/2015 Time: 1047-     Assessment / Plan / Recommendation Clinical Impression  Pt presents w/ increased risk for aspiration; aspiration of thin liquids as has had been dx'd during a swallow study at [facility] per family report, and per family's witnessed report. During this session, pt appeared to adequately tolerate trials of Nectar consistency liquids via cup following general aspiration precautions/min. verbal cues (to include small, single sips) w/ mild throat clearing noted x1 w/ ~4ozs of Nectar liquids. Family has indicated pt is poor at self-monitoring bolus amounts and often takes "large drinks of liquids". Rec. continue w/ Nectar liquids during meals to reduce risk for aspiration; aspiration precautions; meds in Puree - whole as tolerates. Rec. f/u ST services HH vs. SNF for further education and support/dysphagia tx as indicated based on the recommendations of the swallow study from [facility]. Pt may benefit from another objective swallow assessment in the future per MD. NSG updated. ST will f/u w/ toleration of diet; education w/ family/pt while admitted.    HPI Other Pertinent Information: Pt is awake this morning; watching tv. No family present. Pt stated he was drinking the Nectar consistency liquids and denied coughing; he acknowledged he coughed "more" when drinking water. NSG denied any overt s/s of aspiration at this time. Pt does have a moderate history of Aspiration moreso w/ liquids as dx'd by [facility] ~1 yr ago. Reviewed chart notes.    Pertinent Vitals Pain Assessment: No/denies pain  SLP Plan  Continue with current plan of care    Recommendations Diet recommendations: Dysphagia 3 (mechanical soft);Nectar-thick liquid Liquids provided via: Cup;No straw Medication Administration: Whole meds with puree Supervision: Patient able to self  feed;Intermittent supervision to cue for compensatory strategies Compensations: Minimize environmental distractions;Slow rate;Small sips/bites Postural Changes and/or Swallow Maneuvers: Seated upright 90 degrees              General recommendations: Rehab consult (upon d/c home - [facility]?) Oral Care Recommendations: Oral care BID;Staff/trained caregiver to provide oral care Follow up Recommendations: Skilled Nursing facility (vs [facility];  handouts given on thickened liquids and ordering) Plan: Continue with current plan of care    GO    Jerilynn Som, MS, CCC-SLP  Sean Delgado 04/15/2015, 10:59 AM

## 2015-04-15 NOTE — Progress Notes (Signed)
ANTICOAGULATION CONSULT NOTE - Initial Consult  Pharmacy Consult for Warfarin Indication: atrial fibrillation  No Known Allergies  Patient Measurements: Height:  (185.4 cm) Weight: 246 lb (111.585 kg) IBW/kg (Calculated) : 79.9  Vital Signs: Temp: 98.3 F (36.8 C) (10/08 0403) Temp Source: Oral (10/07 1945) BP: 119/70 mmHg (10/08 0403) Pulse Rate: 121 (10/08 0403)  Labs:  Recent Labs  04/12/15 1046 04/13/15 1343 04/14/15 1101 04/14/15 1525 04/15/15 0352  HGB  --  9.1* 9.8*  --   --   HCT  --  29.5* 31.6*  --   --   PLT  --  154 171  --   --   LABPROT  --   --   --  13.0 13.9  INR  --   --   --  0.96 1.05  CREATININE 4.54* 4.22* 3.33*  --   --     Estimated Creatinine Clearance: 23.9 mL/min (by C-G formula based on Cr of 3.33).   Medical History: Past Medical History  Diagnosis Date  . Chronic renal failure, stage 4 (severe) (HCC)   . Hypertension   . CHF (congestive heart failure) (HCC)   . Gout   . Glaucoma   . Neuropathy (HCC)   . Stroke (HCC)   . Peripheral vascular disease (HCC)   . Coronary artery disease   . A-fib (HCC)   . Dysrhythmia   . Blindness of right eye   . Anemia     iron deficiency    Medications:  Scheduled:  . albuterol  5 mg Nebulization Once  . allopurinol  100 mg Oral Daily  . amoxicillin-clavulanate  1 tablet Oral Q24H  . aspirin EC  81 mg Oral Daily  . atorvastatin  20 mg Oral Daily  . brimonidine  1 drop Left Eye BID  . diltiazem  180 mg Oral Daily  . ferrous sulfate  325 mg Oral BID  . furosemide  40 mg Oral BID  . heparin  5,000 Units Subcutaneous 3 times per day  . Influenza vac split quadrivalent PF  0.5 mL Intramuscular Tomorrow-1000  . isosorbide mononitrate  60 mg Oral Daily  . latanoprost  1 drop Both Eyes QHS  . metoprolol  100 mg Oral 3 times per day  . multivitamin with minerals   Oral Daily  . pregabalin  75 mg Oral BID  . senna-docusate  2 tablet Oral QHS  . sodium bicarbonate  650 mg Oral BID  .  sodium chloride  3 mL Intravenous Q12H  . tolnaftate   Topical BID  . trolamine salicylate  1 application Topical QID  . tuberculin  5 Units Intradermal Once  . warfarin  2.5 mg Oral q1800  . Warfarin - Pharmacist Dosing Inpatient   Does not apply q1800    Assessment: Pharmacy consulted to initiate warfarin in a 80 yo male with afib.  Patient also has orders for heparin 5000 units subq q8h.   Baseline INR pending   Goal of Therapy:  INR 2-3 Monitor CBC per policy   Plan:  INR pending.  MD ordered warfarin 5 mg po daily. If baseline not elevated, will transition patient to warfarin 2.5 mg daily.  Of note, patient is receiving Augmentin 500 mg po daily.  Per notes, patient with anemia and also receiving aspirin and clopidogrel therapy.  Will follow INR closely.  INR ordered in AM.   10/8 INR 1.05. Continue current regimen.  Pharmacy will continue to follow.  Crystle Carelli S 04/15/2015,5:27  AM

## 2015-04-15 NOTE — Progress Notes (Signed)
[facility]  ROUNDING NOTE   Subjective:   Patient resting comfortably. He states he is doing well. More alert this morning.  Hemodialysis yesterday, second treatment. Tolerated treatment well. Plan for third treatment later today.   Objective:  Vital signs in last 24 hours:  Temp:  [97.2 F (36.2 C)-99.4 F (37.4 C)] 99.4 F (37.4 C) (10/08 0815) Pulse Rate:  [38-121] 117 (10/08 0815) Resp:  [14-24] 18 (10/08 0815) BP: (97-147)/(45-75) 136/71 mmHg (10/08 0815) SpO2:  [97 %-98 %] 97 % (10/08 0815) Weight:  [111.585 kg (246 lb)] 111.585 kg (246 lb) (10/08 0402)  Weight change: 0.5 kg (1 lb 1.6 oz) Filed Weights   04/14/15 0343 04/14/15 1050 04/15/15 0402  Weight: 111.222 kg (245 lb 3.2 oz) 112.5 kg (248 lb 0.3 oz) 111.585 kg (246 lb)    Intake/Output: I/O last 3 completed shifts: In: 780 [P.O.:780] Out: 1150 [Urine:650; Other:500]   Intake/Output this shift:  Total I/O In: 120 [P.O.:120] Out: 100 [Urine:100]  Physical Exam: General: NAD  Head: Normocephalic, atraumatic. Moist oral mucosal membranes  Eyes: Anicteric, right eye nonreactive  Neck: Supple, trachea midline  Lungs:  Clear to auscultation  Heart: Regular rate and rhythm  Abdomen:  Soft, nontender  Extremities: trace peripheral edema  Neurologic: Nonfocal, moving all four extremities  Skin: No lesions  Access: Maturing left forearm AVF, RIJ permcath Dr. Wyn Quaker 10/5    Basic Metabolic Panel:  Recent Labs Lab 04/09/15 0400 04/10/15 1314 04/12/15 1046 04/13/15 1343 04/14/15 1101  NA 147* 142 151* 147* 143  K 4.6 4.6 4.7 4.3 4.1  CL 112* 107 115* 108 107  CO2 GLUCOSE 102* 141* 166* 164* 127*  BUN 70* 75* 73* 76* 55*  CREATININE 3.91* 4.63* 4.54* 4.22* 3.33*  CALCIUM 8.1* 8.1* 8.4* 8.2* 8.0*  PHOS  --   --   --  4.6 4.1    Liver Function Tests:  Recent Labs Lab 04/08/15 1605 04/13/15 1343 04/14/15 1101  AST 32  --   --   ALT 24  --   --   ALKPHOS 87  --   --    BILITOT 0.7  --   --   PROT 6.9  --   --   ALBUMIN 3.0* 2.6* 2.6*   No results for input(s): LIPASE, AMYLASE in the last 168 hours. No results for input(s): AMMONIA in the last 168 hours.  CBC:  Recent Labs Lab 04/08/15 1604 04/11/15 0604 04/13/15 1343 04/14/15 1101  WBC 7.7 8.6 6.5 8.0  NEUTROABS 6.0  --   --   --   HGB 9.0* 9.0* 9.1* 9.8*  HCT 28.6* 28.9* 29.5* 31.6*  MCV 89.2 89.1 89.1 89.5  PLT 159 166 154 171    Cardiac Enzymes:  Recent Labs Lab 04/08/15 1604  TROPONINI 0.03    BNP: Invalid input(s): POCBNP  CBG: No results for input(s): GLUCAP in the last 168 hours.  Microbiology: Results for orders placed or performed during the hospital encounter of 04/08/15  MRSA PCR Screening     Status: None   Collection Time: 04/09/15  2:27 AM  Result Value Ref Range Status   MRSA by PCR NEGATIVE NEGATIVE Final    Comment:        The GeneXpert MRSA Assay (FDA approved for NASAL specimens only), is one component of a comprehensive MRSA colonization surveillance program. It is not intended to diagnose MRSA infection nor to guide or monitor treatment for MRSA  infections.     Coagulation Studies:  Recent Labs  04/14/15 1525 04/15/15 0352  LABPROT 13.0 13.9  INR 0.96 1.05    Urinalysis: No results for input(s): COLORURINE, LABSPEC, PHURINE, GLUCOSEU, HGBUR, BILIRUBINUR, KETONESUR, PROTEINUR, UROBILINOGEN, NITRITE, LEUKOCYTESUR in the last 72 hours.  Invalid input(s): APPERANCEUR    Imaging: Mr Sherrin Daisy Contrast  04/14/2015   CLINICAL DATA:  81 year old male with respiratory failure, increasing weakness, left side weakness. Initial encounter.  EXAM: MRI HEAD WITHOUT CONTRAST  TECHNIQUE: Multiplanar, multiecho pulse sequences of the brain and surrounding structures were obtained without intravenous contrast.  COMPARISON:  Head CT without contrast 04/12/2015. Brain MRI 01/03/2009.  FINDINGS: There are several subtle areas of cortical restricted diffusion  in the right MCA territory (series 5, image 34) which appear I so to slightly restricted on ADC. On T2 * imaging there is associated chronic micro hemorrhage or petechial hemorrhage (series 11, image 21). Mild if any associated cortical T2 and FLAIR hyperintensity in the right MCA areas implicated on diffusion. No restricted diffusion elsewhere. Major intracranial vascular flow voids are preserved.  Contralateral chronic cortically based infarct in the posterior left MCA territory mostly affecting the left parietal lobe as well as parietal and frontal subcortical white matter. Chronic lacunar infarcts in both thalami, the cerebellar hemispheres, and moderately to severely affecting the brainstem at the pons. These chronic ischemic changes have not significantly progressed since 2010. No other chronic cerebral hemorrhage identified.  No midline shift, mass effect, evidence of mass lesion, ventriculomegaly, extra-axial collection or acute intracranial hemorrhage. Cervicomedullary junction and pituitary are within normal limits. Visible internal auditory structures appear normal. Chronic cervical disc and endplate degeneration. Mastoids and paranasal sinuses are clear. No acute orbit or scalp soft tissue findings. Normal bone marrow signal.  IMPRESSION: 1. Evidence of small subacute infarcts in the right MCA territory with petechial hemorrhage. No mass effect. No definite acute infarct. 2. Chronically advanced underlying left MCA, thalamic, and posterior fossa ischemic disease otherwise appears stable since 2010.   Electronically Signed   By: Odessa Fleming M.D.   On: 04/14/2015 10:45   Nm Pulmonary Perf And Vent  04/14/2015   CLINICAL DATA:  Shortness of breath acute onset.  EXAM: NUCLEAR MEDICINE VENTILATION - PERFUSION LUNG SCAN  TECHNIQUE: Ventilation images were obtained in multiple projections using inhaled aerosol Tc-40m DTPA. Perfusion images were obtained in multiple projections after intravenous injection of  Tc-50m MAA.  RADIOPHARMACEUTICALS:  42.855 mCi Technetium-30m DTPA aerosol inhalation and 4.2 Technetium-56m MAA IV  COMPARISON:  Chest radiography same day.  FINDINGS: Ventilation: Patchy ventilation defects at both lung bases.  Perfusion: Slightly diminished perfusion of the bases consistent with basilar infiltrates/ atelectasis. No clear cut perfusion defects to suggest emboli.  IMPRESSION: Low probability of pulmonary emboli. Matched ventilation-perfusion abnormalities at the lung bases most consistent with atelectasis/ pneumonia.   Electronically Signed   By: Paulina Fusi M.D.   On: 04/14/2015 19:53   Dg Chest Port 1 View  04/14/2015   CLINICAL DATA:  Productive cough  EXAM: PORTABLE CHEST 1 VIEW  COMPARISON:  April 10, 2015  FINDINGS: There is patchy infiltrate in each mid lung and right base regions, stable. There is a minimal left effusion. No new opacity. Heart is mildly enlarged with pulmonary vascularity within normal limits. Central catheter tip is at the cavoatrial junction. No pneumothorax. No adenopathy.  IMPRESSION: Stable patchy infiltrate in both mid lung regions and right base. Small left effusion. No new opacity. No change in cardiac  silhouette. The overall appearance is felt to represent a degree of congestive heart failure, with the areas of infiltrate likely representing alveolar edema. Pneumonia, however, cannot be excluded in one or more of these areas. Central catheter as described without pneumothorax.   Electronically Signed   By: Bretta Bang III M.D.   On: 04/14/2015 15:37     Medications:     . albuterol  5 mg Nebulization Once  . allopurinol  100 mg Oral Daily  . amoxicillin-clavulanate  1 tablet Oral Q24H  . aspirin EC  81 mg Oral Daily  . atorvastatin  20 mg Oral Daily  . brimonidine  1 drop Left Eye BID  . diltiazem  180 mg Oral Daily  . ferrous sulfate  325 mg Oral BID  . furosemide  40 mg Oral BID  . heparin  5,000 Units Subcutaneous 3 times per day  .  Influenza vac split quadrivalent PF  0.5 mL Intramuscular Tomorrow-1000  . isosorbide mononitrate  60 mg Oral Daily  . latanoprost  1 drop Both Eyes QHS  . metoprolol  100 mg Oral 3 times per day  . multivitamin with minerals   Oral Daily  . pregabalin  75 mg Oral BID  . senna-docusate  2 tablet Oral QHS  . sodium bicarbonate  650 mg Oral BID  . sodium chloride  3 mL Intravenous Q12H  . tolnaftate   Topical BID  . trolamine salicylate  1 application Topical QID  . tuberculin  5 Units Intradermal Once  . warfarin  2.5 mg Oral q1800  . Warfarin - Pharmacist Dosing Inpatient   Does not apply q1800   sodium chloride, acetaminophen, HYDROcodone-acetaminophen, sodium chloride, technetium sulfur colloid, technetium TC 32M diethylenetriame-pentaacetic acid  Assessment/ Plan:  Mr. Asser Lucena. is a 81 y.o. black male with right eye blindness, diastolic congestive heart failure, hypertension, gout, glaucoma, peripheral neuropathy, atrial fibrillation, anemia, carotid stenosis status post bilateral CEA, CVA, peripheral vascular disease who was admitted to [facility] on 04/08/2015 for shortness of breath.   Follows with [facility]  1. End Stage Renal Disease: patient started on hemodialysis 10/6: third treatment for later today. Tolerating dialysis well so far. Clinically seeing some improvements. Initial treatment through RIJ permcath. AVF maturing.  - Renally dose all medication - holding ACE-I/ARB due to advanced renal disease. Will need to restart.   2. Hypernatremia: due to overdiuresis with furosemide. Improved to 143 - decreased furosemide to 40mg  bid. PO - No indication for free water.   3. Hypertension: well controlled blood pressures.  - home regimen of imdur, amlodipine, hydralazine, metoprolol, and furosemide.   4. Anemia of chronic kidney disease: hemoglobin of 9.8 - started erythropoietin with HD treatment.  - iron supplement  5. Secondary Hyperparathyroidism: PTH 209. Phos  4.1. Calcium 8.2  Not currently on binders.    LOS: 7 Mercer Peifer 10/8/201611:27 AM

## 2015-04-16 LAB — BASIC METABOLIC PANEL
ANION GAP: 7 (ref 5–15)
BUN: 27 mg/dL — ABNORMAL HIGH (ref 6–20)
CHLORIDE: 99 mmol/L — AB (ref 101–111)
CO2: 32 mmol/L (ref 22–32)
Calcium: 8.1 mg/dL — ABNORMAL LOW (ref 8.9–10.3)
Creatinine, Ser: 2.62 mg/dL — ABNORMAL HIGH (ref 0.61–1.24)
GFR calc Af Amer: 25 mL/min — ABNORMAL LOW (ref >60)
GFR, EST NON AFRICAN AMERICAN: 22 mL/min — AB (ref >60)
GLUCOSE: 103 mg/dL — AB (ref 65–99)
POTASSIUM: 4.3 mmol/L (ref 3.5–5.1)
Sodium: 138 mmol/L (ref 135–145)

## 2015-04-16 LAB — PROTIME-INR
INR: 1.01
Prothrombin Time: 13.5 seconds (ref 11.4–15.0)

## 2015-04-16 MED ORDER — WARFARIN SODIUM 2.5 MG PO TABS
5.0000 mg | ORAL_TABLET | Freq: Once | ORAL | Status: AC
  Administered 2015-04-16: 5 mg via ORAL
  Filled 2015-04-16: qty 2

## 2015-04-16 NOTE — Progress Notes (Signed)
ANTICOAGULATION CONSULT NOTE - Initial Consult  Pharmacy Consult for Warfarin Indication: atrial fibrillation  No Known Allergies  Patient Measurements: Height:  (185.4 cm) Weight: 246 lb (111.585 kg) IBW/kg (Calculated) : 79.9  Vital Signs: Temp: 98 F (36.7 C) (10/08 2051) Temp Source: Oral (10/08 2051) BP: 123/61 mmHg (10/08 2051) Pulse Rate: 108 (10/08 2051)  Labs:  Recent Labs  04/13/15 1343 04/14/15 1101 04/14/15 1525 04/15/15 0352 04/15/15 1723 04/16/15 0331  HGB 9.1* 9.8*  --   --  9.0*  --   HCT 29.5* 31.6*  --   --  29.4*  --   PLT 154 171  --   --  149*  --   LABPROT  --   --  13.0 13.9  --  13.5  INR  --   --  0.96 1.05  --  1.01  CREATININE 4.22* 3.33*  --   --  3.32* 2.62*    Estimated Creatinine Clearance: 30.4 mL/min (by C-G formula based on Cr of 2.62).   Medical History: Past Medical History  Diagnosis Date  . Chronic renal failure, stage 4 (severe) (HCC)   . Hypertension   . CHF (congestive heart failure) (HCC)   . Gout   . Glaucoma   . Neuropathy (HCC)   . Stroke (HCC)   . Peripheral vascular disease (HCC)   . Coronary artery disease   . A-fib (HCC)   . Dysrhythmia   . Blindness of right eye   . Anemia     iron deficiency    Medications:  Scheduled:  . albuterol  5 mg Nebulization Once  . allopurinol  100 mg Oral Daily  . amoxicillin-clavulanate  1 tablet Oral Q24H  . aspirin EC  81 mg Oral Daily  . atorvastatin  20 mg Oral Daily  . brimonidine  1 drop Left Eye BID  . diltiazem  180 mg Oral Daily  . ferrous sulfate  325 mg Oral BID  . furosemide  40 mg Oral BID  . heparin  5,000 Units Subcutaneous 3 times per day  . Influenza vac split quadrivalent PF  0.5 mL Intramuscular Tomorrow-1000  . isosorbide mononitrate  60 mg Oral Daily  . latanoprost  1 drop Both Eyes QHS  . metoprolol  100 mg Oral 3 times per day  . multivitamin with minerals   Oral Daily  . pregabalin  75 mg Oral BID  . senna-docusate  2 tablet Oral QHS   . sodium bicarbonate  650 mg Oral BID  . sodium chloride  3 mL Intravenous Q12H  . tolnaftate   Topical BID  . trolamine salicylate  1 application Topical QID  . warfarin  2.5 mg Oral q1800  . Warfarin - Pharmacist Dosing Inpatient   Does not apply q1800    Assessment: Pharmacy consulted to initiate warfarin in a 81 yo male with afib.  Patient also has orders for heparin 5000 units subq q8h.   Baseline INR pending   Goal of Therapy:  INR 2-3 Monitor CBC per policy   Plan:  INR pending.  MD ordered warfarin 5 mg po daily. If baseline not elevated, will transition patient to warfarin 2.5 mg daily.  Of note, patient is receiving Augmentin 500 mg po daily.  Per notes, patient with anemia and also receiving aspirin and clopidogrel therapy.  Will follow INR closely.  INR ordered in AM.   10/8 INR 1.05. Continue current regimen. 10/9 INR 1.01. Continue current regimen. If no change in  INR tomorrow may increase dose.   Pharmacy will continue to follow.  Janean Eischen S 04/16/2015,4:59 AM

## 2015-04-16 NOTE — Progress Notes (Signed)
ANTICOAGULATION CONSULT NOTE - Initial Consult  Pharmacy Consult for Warfarin Indication: atrial fibrillation  No Known Allergies  Patient Measurements: Height:  (185.4 cm) Weight: 241 lb 6.4 oz (109.498 kg) IBW/kg (Calculated) : 79.9  Vital Signs: Temp: 98.5 F (36.9 C) (10/09 0810) Temp Source: Oral (10/09 0810) BP: 135/61 mmHg (10/09 0810) Pulse Rate: 97 (10/09 0810)  Labs:  Recent Labs  04/14/15 1101 04/14/15 1525 04/15/15 0352 04/15/15 1723 04/16/15 0331  HGB 9.8*  --   --  9.0*  --   HCT 31.6*  --   --  29.4*  --   PLT 171  --   --  149*  --   LABPROT  --  13.0 13.9  --  13.5  INR  --  0.96 1.05  --  1.01  CREATININE 3.33*  --   --  3.32* 2.62*    Estimated Creatinine Clearance: 30.1 mL/min (by C-G formula based on Cr of 2.62).   Medical History: Past Medical History  Diagnosis Date  . Chronic renal failure, stage 4 (severe) (HCC)   . Hypertension   . CHF (congestive heart failure) (HCC)   . Gout   . Glaucoma   . Neuropathy (HCC)   . Stroke (HCC)   . Peripheral vascular disease (HCC)   . Coronary artery disease   . A-fib (HCC)   . Dysrhythmia   . Blindness of right eye   . Anemia     iron deficiency    Medications:  Scheduled:  . albuterol  5 mg Nebulization Once  . allopurinol  100 mg Oral Daily  . amoxicillin-clavulanate  1 tablet Oral Q24H  . aspirin EC  81 mg Oral Daily  . atorvastatin  20 mg Oral Daily  . brimonidine  1 drop Left Eye BID  . diltiazem  180 mg Oral Daily  . ferrous sulfate  325 mg Oral BID  . furosemide  40 mg Oral BID  . heparin  5,000 Units Subcutaneous 3 times per day  . Influenza vac split quadrivalent PF  0.5 mL Intramuscular Tomorrow-1000  . isosorbide mononitrate  60 mg Oral Daily  . latanoprost  1 drop Both Eyes QHS  . metoprolol  100 mg Oral 3 times per day  . multivitamin with minerals   Oral Daily  . pregabalin  75 mg Oral BID  . senna-docusate  2 tablet Oral QHS  . sodium bicarbonate  650 mg Oral  BID  . sodium chloride  3 mL Intravenous Q12H  . tolnaftate   Topical BID  . trolamine salicylate  1 application Topical QID  . warfarin  5 mg Oral ONCE-1800  . Warfarin - Pharmacist Dosing Inpatient   Does not apply q1800    Assessment: Pharmacy consulted to initiate warfarin in a 81 yo male with afib.  Patient also has orders for heparin 5000 units subq q8h.   10/7 - INR: 0.96, coumadin 2.5mg  given 10/8 - INR: 1.05, coumadin 2.5mg  ordered - not given 10/9 - INR: 1.01  Goal of Therapy:  INR 2-3 Monitor CBC per policy   Plan:  Current orders to for warfarin 2.5 mg daily. Discussed with MD, would like to increase to  daily until INR moves significantly. Of note, patient ordered warfarin yesterday but was not given (documented as patient not available).   Patient is receiving Augmentin 500 mg po daily.  Per notes, patient with anemia and also receiving aspirin and clopidogrel therapy.    Orderd coumadin  x  1 today. Will check INR in AM 0 MD would like to continue  daily until INR moves. Will follow INR closely.   Pharmacy will continue to follow.  Garlon Hatchet, PharmD Clinical Pharmacist  04/16/2015 2:58 PM

## 2015-04-16 NOTE — Progress Notes (Signed)
[facility] - [place] at [facility]   PATIENT NAME: Sean Delgado    MR#:  [id number]  DATE OF BIRTH:  Sep 11, 1933  SUBJECTIVE:  CHIEF COMPLAINT:   Chief Complaint  Patient presents with  . Shortness of Breath   - Less dyspneic today. Oxygen down to 2 at 3 L today. Had his third session of dialysis yesterday. -Some confusion noted today.  REVIEW OF SYSTEMS:  Review of Systems  Constitutional: Negative for fever and chills.  HENT: Negative for ear discharge, ear pain and nosebleeds.   Eyes: Negative for blurred vision and double vision.       Right eye blindness  Respiratory: Positive for shortness of breath. Negative for cough and wheezing.   Cardiovascular: Positive for orthopnea and leg swelling. Negative for chest pain, palpitations and PND.  Gastrointestinal: Negative for nausea, vomiting, abdominal pain, diarrhea and constipation.  Genitourinary: Negative for dysuria.  Neurological: Negative for dizziness, sensory change, speech change, seizures, weakness and headaches.  Psychiatric/Behavioral: Negative for depression.    DRUG ALLERGIES:  No Known Allergies  VITALS:  Blood pressure 135/61, pulse 97, temperature 98.5 F (36.9 C), temperature source Oral, resp. rate 18, height  (1.854 m), weight 109.498 kg (241 lb 6.4 oz), SpO2 93 %.  PHYSICAL EXAMINATION:  Physical Exam  GENERAL:  81 y.o.-year-old patient lying in the bed with no acute distress.  EYES: Pupils equal, round, reactive to light and accommodation. No scleral icterus. Extraocular muscles intact. Patient is legally blind in right eye HEENT: Head atraumatic, normocephalic. Oropharynx and nasopharynx clear.  NECK:  Supple, no jugular venous distention. No thyroid enlargement, no tenderness.  LUNGS: Normal breath sounds bilaterally, decreased bibasilar breath sounds.  no wheezing, or rhonchi.No use of accessory muscles of respiration on exertion noted.  CARDIOVASCULAR: S1, S2 normal.  No  rubs, or gallops. 3/6 systolic murmur present ABDOMEN: Soft, nontender, nondistended. Bowel sounds present. No organomegaly or mass.  EXTREMITIES: No cyanosis, or clubbing. 1+ pedal edema noted NEUROLOGIC: Cranial nerves II through XII are intact. Muscle strength 5/5 in all extremities. Sensation intact. Gait not checked.  PSYCHIATRIC: The patient is alert and oriented x 2-3.  SKIN: No obvious rash, lesion, or ulcer.    LABORATORY PANEL:   CBC  Recent Labs Lab 04/15/15 1723  WBC 7.2  HGB 9.0*  HCT 29.4*  PLT 149*   ------------------------------------------------------------------------------------------------------------------  Chemistries   Recent Labs Lab 04/16/15 0331  NA 138  K 4.3  CL 99*  CO2 32  GLUCOSE 103*  BUN 27*  CREATININE 2.62*  CALCIUM 8.1*   ------------------------------------------------------------------------------------------------------------------  Cardiac Enzymes No results for input(s): TROPONINI in the last 168 hours. ------------------------------------------------------------------------------------------------------------------  RADIOLOGY:  Nm Pulmonary Perf And Vent  04/14/2015   CLINICAL DATA:  Shortness of breath acute onset.  EXAM: NUCLEAR MEDICINE VENTILATION - PERFUSION LUNG SCAN  TECHNIQUE: Ventilation images were obtained in multiple projections using inhaled aerosol Tc-38m DTPA. Perfusion images were obtained in multiple projections after intravenous injection of Tc-12m MAA.  RADIOPHARMACEUTICALS:  42.855 mCi Technetium-25m DTPA aerosol inhalation and 4.2 Technetium-13m MAA IV  COMPARISON:  Chest radiography same day.  FINDINGS: Ventilation: Patchy ventilation defects at both lung bases.  Perfusion: Slightly diminished perfusion of the bases consistent with basilar infiltrates/ atelectasis. No clear cut perfusion defects to suggest emboli.  IMPRESSION: Low probability of pulmonary emboli. Matched ventilation-perfusion  abnormalities at the lung bases most consistent with atelectasis/ pneumonia.   Electronically Signed   By: Scherrie Bateman.D.  On: 04/14/2015 19:53   Dg Chest Port 1 View  04/14/2015   CLINICAL DATA:  Productive cough  EXAM: PORTABLE CHEST 1 VIEW  COMPARISON:  April 10, 2015  FINDINGS: There is patchy infiltrate in each mid lung and right base regions, stable. There is a minimal left effusion. No new opacity. Heart is mildly enlarged with pulmonary vascularity within normal limits. Central catheter tip is at the cavoatrial junction. No pneumothorax. No adenopathy.  IMPRESSION: Stable patchy infiltrate in both mid lung regions and right base. Small left effusion. No new opacity. No change in cardiac silhouette. The overall appearance is felt to represent a degree of congestive heart failure, with the areas of infiltrate likely representing alveolar edema. Pneumonia, however, cannot be excluded in one or more of these areas. Central catheter as described without pneumothorax.   Electronically Signed   By: Bretta Bang III M.D.   On: 04/14/2015 15:37    EKG:   Orders placed or performed during the hospital encounter of 04/08/15  . EKG 12-Lead  . EKG 12-Lead  . ED EKG  . ED EKG  . EKG 12-Lead  . EKG 12-Lead    ASSESSMENT AND PLAN:   81 year old African-American male with past medical history significant for coronary artery disease, CVA, peripheral vascular disease, CK D admitted to the hospital secondary to dyspnea and acute on chronic renal failure  #1 acute respiratory failure-secondary to acute on chronic diastolic congestive heart failure exacerbation. -Chest x-ray with significant pulmonary edema. -Continue Lasix. -Also on dialysis. -Required BiPAP in the past, currently stable on 2 L oxygen.  -Will need sleep study as outpatient  #2 pneumonia-currently on Augmentin. Improving. Follow-up chest x-ray in 2-3 days.  #3 dysphagia-appreciate speech therapy input. Patient currently on  dysphagia diet with thin liquids. -CT of the head without any acute stroke noted.  #4 end-stage renal disease-AV fistula on left forearm placed as outpatient. Since acutely needs dialysis, right IJ permacath placed. -Appreciate nephrology and vascular consults. -Continue to hold Ace-I and ARB at this time. -Started on hemodialysis and received 3 treatments. -Outpatient dialysis with [facility] nephrology.  #5 hypertension-on Imdur, metoprolol and Cardizem  #6 atrial fibrillation-rate is controlled with metoprolol and Cardizem. -On warfarin. Follow-up INR and pharmacy adjusting dose  #7 DVT prophylaxis-subcutaneous heparin, on warfarin, INR subtherapeutic   All the records are reviewed and case discussed with Care Management/Social Workerr. Management plans discussed with the patient, family and they are in agreement.  CODE STATUS: Full Code   TOTAL TIME TAKING CARE OF THIS PATIENT: 32 minutes.   POSSIBLE D/C IN 2 DAYS, DEPENDING ON CLINICAL CONDITION.   Tais Koestner M.D on 04/16/2015 at 11:45 AM  Between 7am to 6pm - Pager - [phone]  After 6pm go to [email or web address] - password [email or web address] [facility]  [facility]  Office  [phone]  CC: Primary care physician; Bobbye Morton, MD

## 2015-04-16 NOTE — Care Management Note (Signed)
Case Management Note  Patient Details  Name: Eleuterio Dollar. MRN: [id number] Date of Birth: Nov 19, 1933  Subjective/Objective:  Mr Odwyer is from [facility] and current discharge plan is to return to [facility].                 Action/Plan:   Expected Discharge Date:  04/10/15               Expected Discharge Plan:     In-House Referral:     Discharge planning Services     Post Acute Care Choice:    Choice offered to:     DME Arranged:    DME Agency:     HH Arranged:    HH Agency:     Status of Service:     Medicare Important Message Given:  Yes-fourth notification given Date Medicare IM Given:    Medicare IM give by:    Date Additional Medicare IM Given:    Additional Medicare Important Message give by:     If discussed at Long Length of Stay Meetings, dates discussed:    Additional Comments:  Orman Matsumura A, RN 04/16/2015, 1:59 PM

## 2015-04-16 NOTE — Progress Notes (Signed)
[facility]  ROUNDING NOTE   Subjective:   Sister at bedside. Patient is in a good mood.  Hemodialysis yesterday. Third treatment.   Objective:  Vital signs in last 24 hours:  Temp:  [97.7 F (36.5 C)-98.9 F (37.2 C)] 98.5 F (36.9 C) (10/09 0810) Pulse Rate:  [46-143] 97 (10/09 0810) Resp:  [17-24] 18 (10/09 0810) BP: (117-135)/(58-80) 135/61 mmHg (10/09 0810) SpO2:  [93 %-96 %] 93 % (10/09 0810) Weight:  [109.498 kg (241 lb 6.4 oz)] 109.498 kg (241 lb 6.4 oz) (10/09 0459)  Weight change: -3.002 kg (-6 lb 9.9 oz) Filed Weights   04/14/15 1050 04/15/15 0402 04/16/15 0459  Weight: 112.5 kg (248 lb 0.3 oz) 111.585 kg (246 lb) 109.498 kg (241 lb 6.4 oz)    Intake/Output: I/O last 3 completed shifts: In: 120 [P.O.:120] Out: 2900 [Urine:900; Other:2000]   Intake/Output this shift:  Total I/O In: 480 [P.O.:480] Out: 350 [Urine:350]  Physical Exam: General: NAD  Head: Normocephalic, atraumatic. Moist oral mucosal membranes  Eyes: Anicteric, right eye nonreactive  Neck: Supple, trachea midline  Lungs:  Clear to auscultation  Heart: Regular rate and rhythm  Abdomen:  Soft, nontender  Extremities: No peripheral edema  Neurologic: Nonfocal, moving all four extremities  Skin: No lesions  Access: Maturing left forearm AVF, RIJ permcath Dr. Wyn Quaker 10/5    Basic Metabolic Panel:  Recent Labs Lab 04/12/15 1046 04/13/15 1343 04/14/15 1101 04/15/15 1723 04/16/15 0331  NA 151* 147* 143 141 138  K 4.7 4.3 4.1 4.7 4.3  CL 115* 108 107 103 99*  CO2 32 32  GLUCOSE 166* 164* 127* 148* 103*  BUN 73* 76* 55* 44* 27*  CREATININE 4.54* 4.22* 3.33* 3.32* 2.62*  CALCIUM 8.4* 8.2* 8.0* 8.1* 8.1*  PHOS  --  4.6 4.1 4.8*  --     Liver Function Tests:  Recent Labs Lab 04/13/15 1343 04/14/15 1101 04/15/15 1723  ALBUMIN 2.6* 2.6* 2.5*   No results for input(s): LIPASE, AMYLASE in the last 168 hours. No results for input(s): AMMONIA in the last 168  hours.  CBC:  Recent Labs Lab 04/11/15 0604 04/13/15 1343 04/14/15 1101 04/15/15 1723  WBC 8.6 6.5 8.0 7.2  HGB 9.0* 9.1* 9.8* 9.0*  HCT 28.9* 29.5* 31.6* 29.4*  MCV 89.1 89.1 89.5 88.9  PLT 166 154 171 149*    Cardiac Enzymes: No results for input(s): CKTOTAL, CKMB, CKMBINDEX, TROPONINI in the last 168 hours.  BNP: Invalid input(s): POCBNP  CBG: No results for input(s): GLUCAP in the last 168 hours.  Microbiology: Results for orders placed or performed during the hospital encounter of 04/08/15  MRSA PCR Screening     Status: None   Collection Time: 04/09/15  2:27 AM  Result Value Ref Range Status   MRSA by PCR NEGATIVE NEGATIVE Final    Comment:        The GeneXpert MRSA Assay (FDA approved for NASAL specimens only), is one component of a comprehensive MRSA colonization surveillance program. It is not intended to diagnose MRSA infection nor to guide or monitor treatment for MRSA infections.     Coagulation Studies:  Recent Labs  04/14/15 1525 04/15/15 0352 04/16/15 0331  LABPROT 13.0 13.9 13.5  INR 0.96 1.05 1.01    Urinalysis: No results for input(s): COLORURINE, LABSPEC, PHURINE, GLUCOSEU, HGBUR, BILIRUBINUR, KETONESUR, PROTEINUR, UROBILINOGEN, NITRITE, LEUKOCYTESUR in the last 72 hours.  Invalid input(s): APPERANCEUR    Imaging: Nm Pulmonary Perf And Vent  04/14/2015  CLINICAL DATA:  Shortness of breath acute onset.  EXAM: NUCLEAR MEDICINE VENTILATION - PERFUSION LUNG SCAN  TECHNIQUE: Ventilation images were obtained in multiple projections using inhaled aerosol Tc-66m DTPA. Perfusion images were obtained in multiple projections after intravenous injection of Tc-13m MAA.  RADIOPHARMACEUTICALS:  42.855 mCi Technetium-9m DTPA aerosol inhalation and 4.2 Technetium-32m MAA IV  COMPARISON:  Chest radiography same day.  FINDINGS: Ventilation: Patchy ventilation defects at both lung bases.  Perfusion: Slightly diminished perfusion of the bases consistent  with basilar infiltrates/ atelectasis. No clear cut perfusion defects to suggest emboli.  IMPRESSION: Low probability of pulmonary emboli. Matched ventilation-perfusion abnormalities at the lung bases most consistent with atelectasis/ pneumonia.   Electronically Signed   By: Paulina Fusi M.D.   On: 04/14/2015 19:53   Dg Chest Port 1 View  04/14/2015   CLINICAL DATA:  Productive cough  EXAM: PORTABLE CHEST 1 VIEW  COMPARISON:  April 10, 2015  FINDINGS: There is patchy infiltrate in each mid lung and right base regions, stable. There is a minimal left effusion. No new opacity. Heart is mildly enlarged with pulmonary vascularity within normal limits. Central catheter tip is at the cavoatrial junction. No pneumothorax. No adenopathy.  IMPRESSION: Stable patchy infiltrate in both mid lung regions and right base. Small left effusion. No new opacity. No change in cardiac silhouette. The overall appearance is felt to represent a degree of congestive heart failure, with the areas of infiltrate likely representing alveolar edema. Pneumonia, however, cannot be excluded in one or more of these areas. Central catheter as described without pneumothorax.   Electronically Signed   By: Bretta Bang III M.D.   On: 04/14/2015 15:37     Medications:     . albuterol  5 mg Nebulization Once  . allopurinol  100 mg Oral Daily  . amoxicillin-clavulanate  1 tablet Oral Q24H  . aspirin EC  81 mg Oral Daily  . atorvastatin  20 mg Oral Daily  . brimonidine  1 drop Left Eye BID  . diltiazem  180 mg Oral Daily  . ferrous sulfate  325 mg Oral BID  . furosemide  40 mg Oral BID  . heparin  5,000 Units Subcutaneous 3 times per day  . Influenza vac split quadrivalent PF  0.5 mL Intramuscular Tomorrow-1000  . isosorbide mononitrate  60 mg Oral Daily  . latanoprost  1 drop Both Eyes QHS  . metoprolol  100 mg Oral 3 times per day  . multivitamin with minerals   Oral Daily  . pregabalin  75 mg Oral BID  . senna-docusate  2  tablet Oral QHS  . sodium bicarbonate  650 mg Oral BID  . sodium chloride  3 mL Intravenous Q12H  . tolnaftate   Topical BID  . trolamine salicylate  1 application Topical QID  . warfarin  5 mg Oral ONCE-1800  . Warfarin - Pharmacist Dosing Inpatient   Does not apply q1800   sodium chloride, acetaminophen, HYDROcodone-acetaminophen, sodium chloride, technetium sulfur colloid, technetium TC 30M diethylenetriame-pentaacetic acid  Assessment/ Plan:  [name]. is a 81 y.o. black male with right eye blindness, diastolic congestive heart failure, hypertension, gout, glaucoma, peripheral neuropathy, atrial fibrillation, anemia, carotid stenosis status post bilateral CEA, CVA, peripheral vascular disease who was admitted to [facility] on 04/08/2015 for shortness of breath.   Follows with [facility] Nephrology  1. End Stage Renal Disease: patient started on hemodialysis 10/6: third treatment yesterday. Tolerating dialysis well so far. Clinically seeing some improvements. Initial  treatment through RIJ permcath. AVF maturing.  - Renally dose all medication - holding ACE-I/ARB due to advanced renal disease. Will need to restart.  - Outpatient dialysis at [facility]. Will follow with [facility]phrology.   2. Hypernatremia: due to overdiuresis with furosemide. Improved - decreased furosemide to 40mg  bid. PO - No indication for free water.   3. Hypertension: well controlled blood pressures.  - home regimen of imdur, amlodipine, hydralazine, metoprolol, and furosemide.   4. Anemia of chronic kidney disease: hemoglobin of 9 - started erythropoietin with HD treatment.  - iron supplement  5. Secondary Hyperparathyroidism: PTH 209. Phos 4.8. Calcium 8.2  Not currently on binders.    LOS: 8 Sean Delgado 10/9/201612:19 PM

## 2015-04-16 NOTE — Care Management Important Message (Signed)
Important Message  Patient Details  Name: Sean Delgado. MRN: [id number] Date of Birth: 12/15/1933   Medicare Important Message Given:  Yes-fourth notification given    Emmanuel Gruenhagen A, RN 04/16/2015, 8:57 AM

## 2015-04-17 ENCOUNTER — Inpatient Hospital Stay: Payer: Medicare (Managed Care)

## 2015-04-17 LAB — CBC
HEMATOCRIT: 31.3 % — AB (ref 40.0–52.0)
HEMOGLOBIN: 9.7 g/dL — AB (ref 13.0–18.0)
MCH: 27.7 pg (ref 26.0–34.0)
MCHC: 30.9 g/dL — ABNORMAL LOW (ref 32.0–36.0)
MCV: 89.7 fL (ref 80.0–100.0)
Platelets: 137 10*3/uL — ABNORMAL LOW (ref 150–440)
RBC: 3.49 MIL/uL — AB (ref 4.40–5.90)
RDW: 17.6 % — ABNORMAL HIGH (ref 11.5–14.5)
WBC: 10.2 10*3/uL (ref 3.8–10.6)

## 2015-04-17 LAB — RENAL FUNCTION PANEL
ALBUMIN: 2.6 g/dL — AB (ref 3.5–5.0)
ANION GAP: 6 (ref 5–15)
BUN: 45 mg/dL — ABNORMAL HIGH (ref 6–20)
CO2: 32 mmol/L (ref 22–32)
Calcium: 8.2 mg/dL — ABNORMAL LOW (ref 8.9–10.3)
Chloride: 98 mmol/L — ABNORMAL LOW (ref 101–111)
Creatinine, Ser: 3.81 mg/dL — ABNORMAL HIGH (ref 0.61–1.24)
GFR, EST AFRICAN AMERICAN: 16 mL/min — AB (ref >60)
GFR, EST NON AFRICAN AMERICAN: 14 mL/min — AB (ref >60)
Glucose, Bld: 116 mg/dL — ABNORMAL HIGH (ref 65–99)
PHOSPHORUS: 4.6 mg/dL (ref 2.5–4.6)
POTASSIUM: 3.9 mmol/L (ref 3.5–5.1)
Sodium: 136 mmol/L (ref 135–145)

## 2015-04-17 LAB — PROTIME-INR
INR: 1.11
Prothrombin Time: 14.5 seconds (ref 11.4–15.0)

## 2015-04-17 LAB — BASIC METABOLIC PANEL
ANION GAP: 8 (ref 5–15)
BUN: 43 mg/dL — ABNORMAL HIGH (ref 6–20)
CHLORIDE: 99 mmol/L — AB (ref 101–111)
CO2: 31 mmol/L (ref 22–32)
CREATININE: 3.63 mg/dL — AB (ref 0.61–1.24)
Calcium: 8.3 mg/dL — ABNORMAL LOW (ref 8.9–10.3)
GFR calc non Af Amer: 15 mL/min — ABNORMAL LOW (ref >60)
GFR, EST AFRICAN AMERICAN: 17 mL/min — AB (ref >60)
Glucose, Bld: 109 mg/dL — ABNORMAL HIGH (ref 65–99)
POTASSIUM: 4.5 mmol/L (ref 3.5–5.1)
Sodium: 138 mmol/L (ref 135–145)

## 2015-04-17 LAB — MAGNESIUM: MAGNESIUM: 2.3 mg/dL (ref 1.7–2.4)

## 2015-04-17 MED ORDER — AMOXICILLIN-POT CLAVULANATE 500-125 MG PO TABS: 1.0000 | ORAL_TABLET | ORAL | Status: DC

## 2015-04-17 MED ORDER — WARFARIN SODIUM 5 MG PO TABS
5.0000 mg | ORAL_TABLET | Freq: Every day | ORAL | Status: DC
  Administered 2015-04-17: 5 mg via ORAL
  Filled 2015-04-17 (×2): qty 1

## 2015-04-17 MED ORDER — HYDROCODONE-ACETAMINOPHEN 5-325 MG PO TABS: 1.0000 | ORAL_TABLET | Freq: Four times a day (QID) | ORAL | Status: DC | PRN

## 2015-04-17 MED ORDER — ATORVASTATIN CALCIUM 20 MG PO TABS: 20.0000 mg | ORAL_TABLET | Freq: Every day | ORAL | Status: AC

## 2015-04-17 MED ORDER — DILTIAZEM HCL ER COATED BEADS 180 MG PO CP24: 180.0000 mg | ORAL_CAPSULE | Freq: Every day | ORAL | Status: AC

## 2015-04-17 MED ORDER — WARFARIN SODIUM 5 MG PO TABS: 5.0000 mg | ORAL_TABLET | Freq: Every day | ORAL | Status: DC

## 2015-04-17 MED ORDER — EPOETIN ALFA 10000 UNIT/ML IJ SOLN
10000.0000 [IU] | Freq: Once | INTRAMUSCULAR | Status: AC
  Administered 2015-04-17: 10000 [IU] via INTRAVENOUS

## 2015-04-17 MED ORDER — FUROSEMIDE 40 MG PO TABS: 40.0000 mg | ORAL_TABLET | Freq: Two times a day (BID) | ORAL | Status: DC

## 2015-04-17 NOTE — Progress Notes (Signed)
[facility]  ROUNDING NOTE   Subjective:  Pt completed HD. Tolerated well. UF achieved was 2kg.    Objective:  Vital signs in last 24 hours:  Temp:  [97 F (36.1 C)-98.8 F (37.1 C)] 98.4 F (36.9 C) (10/10 1400) Pulse Rate:  [42-108] 48 (10/10 1400) Resp:  [16-28] 18 (10/10 1400) BP: (106-149)/(47-83) 128/77 mmHg (10/10 1400) SpO2:  [91 %-97 %] 97 % (10/10 1400) Weight:  [109 kg (240 lb 4.8 oz)-109.4 kg (241 lb 2.9 oz)] 109 kg (240 lb 4.8 oz) (10/10 1345)  Weight change:  Filed Weights   04/16/15 0459 04/17/15 1020 04/17/15 1345  Weight: 109.498 kg (241 lb 6.4 oz) 109.4 kg (241 lb 2.9 oz) 109 kg (240 lb 4.8 oz)    Intake/Output: I/O last 3 completed shifts: In: 1080 [P.O.:1080] Out: 2800 [Urine:800; Other:2000]   Intake/Output this shift:  Total I/O In: -  Out: 2000 [Other:2000]  Physical Exam: General: NAD  Head: Normocephalic, atraumatic. Moist oral mucosal membranes  Eyes: Anicteric  Neck: Supple, trachea midline  Lungs:  Clear to auscultation normal effort  Heart: Regular rate and rhythm  Abdomen:  Soft, nontender, BS present  Extremities: No peripheral edema  Neurologic: Nonfocal, moving all four extremities  Skin: No lesions  Access: Maturing left forearm AVF, RIJ permcath Dr. Wyn Quaker 10/5    Basic Metabolic Panel:  Recent Labs Lab 04/13/15 1343 04/14/15 1101 04/15/15 1723 04/16/15 0331 04/17/15 0328 04/17/15 1128 04/17/15 1129  NA 147* 143 141 138 138  --  136  K 4.3 4.1 4.7 4.3 4.5  --  3.9  CL 108 107 103 99* 99*  --  98*  CO2 30 31 32 32 31  --  32  GLUCOSE 164* 127* 148* 103* 109*  --  116*  BUN 76* 55* 44* 27* 43*  --  45*  CREATININE 4.22* 3.33* 3.32* 2.62* 3.63*  --  3.81*  CALCIUM 8.2* 8.0* 8.1* 8.1* 8.3*  --  8.2*  MG  --   --   --   --   --  2.3  --   PHOS 4.6 4.1 4.8*  --   --   --  4.6    Liver Function Tests:  Recent Labs Lab 04/13/15 1343 04/14/15 1101 04/15/15 1723 04/17/15 1129  ALBUMIN 2.6* 2.6* 2.5*  2.6*   No results for input(s): LIPASE, AMYLASE in the last 168 hours. No results for input(s): AMMONIA in the last 168 hours.  CBC:  Recent Labs Lab 04/11/15 0604 04/13/15 1343 04/14/15 1101 04/15/15 1723 04/17/15 0329  WBC 8.6 6.5 8.0 7.2 10.2  HGB 9.0* 9.1* 9.8* 9.0* 9.7*  HCT 28.9* 29.5* 31.6* 29.4* 31.3*  MCV 89.1 89.1 89.5 88.9 89.7  PLT 166 154 171 149* 137*    Cardiac Enzymes: No results for input(s): CKTOTAL, CKMB, CKMBINDEX, TROPONINI in the last 168 hours.  BNP: Invalid input(s): POCBNP  CBG: No results for input(s): GLUCAP in the last 168 hours.  Microbiology: Results for orders placed or performed during the hospital encounter of 04/08/15  MRSA PCR Screening     Status: None   Collection Time: 04/09/15  2:27 AM  Result Value Ref Range Status   MRSA by PCR NEGATIVE NEGATIVE Final    Comment:        The GeneXpert MRSA Assay (FDA approved for NASAL specimens only), is one component of a comprehensive MRSA colonization surveillance program. It is not intended to diagnose MRSA infection nor to guide or monitor  treatment for MRSA infections.     Coagulation Studies:  Recent Labs  04/14/15 1525 04/15/15 0352 04/16/15 0331 04/17/15 0328  LABPROT 13.0 13.9 13.5 14.5  INR 0.96 1.05 1.01 1.11    Urinalysis: No results for input(s): COLORURINE, LABSPEC, PHURINE, GLUCOSEU, HGBUR, BILIRUBINUR, KETONESUR, PROTEINUR, UROBILINOGEN, NITRITE, LEUKOCYTESUR in the last 72 hours.  Invalid input(s): APPERANCEUR    Imaging: Dg Chest 2 View  04/17/2015   CLINICAL DATA:  COPD exacerbation.  Congestive heart failure.  EXAM: CHEST  2 VIEW  COMPARISON:  April 14, 2015  FINDINGS: There is a central catheter with tip the cavoatrial junction. No pneumothorax. There is stable cardiomegaly with pulmonary vascularity within normal limits. Lungs are currently clear except for slight left base atelectasis. There is a small left effusion. No adenopathy. There is  degenerative change in the thoracic spine.  IMPRESSION: Mild left base atelectasis is small left effusion. Lungs elsewhere clear. Stable cardiomegaly. No new opacity. No pneumothorax.   Electronically Signed   By: Bretta Bang III M.D.   On: 04/17/2015 07:41     Medications:     . albuterol  5 mg Nebulization Once  . allopurinol  100 mg Oral Daily  . amoxicillin-clavulanate  1 tablet Oral Q24H  . aspirin EC  81 mg Oral Daily  . atorvastatin  20 mg Oral Daily  . brimonidine  1 drop Left Eye BID  . diltiazem  180 mg Oral Daily  . ferrous sulfate  325 mg Oral BID  . furosemide  40 mg Oral BID  . heparin  5,000 Units Subcutaneous 3 times per day  . Influenza vac split quadrivalent PF  0.5 mL Intramuscular Tomorrow-1000  . isosorbide mononitrate  60 mg Oral Daily  . latanoprost  1 drop Both Eyes QHS  . metoprolol  100 mg Oral 3 times per day  . multivitamin with minerals   Oral Daily  . pregabalin  75 mg Oral BID  . senna-docusate  2 tablet Oral QHS  . sodium bicarbonate  650 mg Oral BID  . sodium chloride  3 mL Intravenous Q12H  . tolnaftate   Topical BID  . trolamine salicylate  1 application Topical QID  . warfarin  5 mg Oral q1800  . Warfarin - Pharmacist Dosing Inpatient   Does not apply q1800   sodium chloride, acetaminophen, HYDROcodone-acetaminophen, sodium chloride, technetium sulfur colloid, technetium TC 97M diethylenetriame-pentaacetic acid  Assessment/ Plan:  [name]. is a 81 y.o. black male with right eye blindness, diastolic congestive heart failure, hypertension, gout, glaucoma, peripheral neuropathy, atrial fibrillation, anemia, carotid stenosis status post bilateral CEA, CVA, peripheral vascular disease who was admitted to [facility] on 04/08/2015 for shortness of breath.   Follows with [facility] Nephrology  1. End Stage Renal Disease: patient started on hemodialysis 10/6. - pt completed HD today, UF achieved was 2kg, outpt planning ongoing, pt to follow with  [facility]phrology upon D/C.   2. Hypernatremia: resolved, Na now 136.   3. Hypertension:  BP 128/77, continue current antihypertensives.  4. Anemia of chronic kidney disease: hgb 9.7, continue epogen with HD.   5. Secondary Hyperparathyroidism: last phos 4.6 and acceptable.    LOS: 9 Vernelle Wisner 10/10/20162:15 PM

## 2015-04-17 NOTE — Discharge Summary (Signed)
[facility] [place] at [facility]   PATIENT NAME: Sean Delgado    MR#:  [id number]  DATE OF BIRTH:  1933/09/14  DATE OF ADMISSION:  04/08/2015 ADMITTING PHYSICIAN: Marcelino Duster, MD  DATE OF DISCHARGE: 04/17/2015  PRIMARY CARE PHYSICIAN: Bobbye Morton, MD    ADMISSION DIAGNOSIS:  Alt mental status ESRD  DISCHARGE DIAGNOSIS:  Active Problems:   Acute respiratory failure (HCC)   SECONDARY DIAGNOSIS:   Past Medical History  Diagnosis Date  . Chronic renal failure, stage 4 (severe) (HCC)   . Hypertension   . CHF (congestive heart failure) (HCC)   . Gout   . Glaucoma   . Neuropathy (HCC)   . Stroke (HCC)   . Peripheral vascular disease (HCC)   . Coronary artery disease   . A-fib (HCC)   . Dysrhythmia   . Blindness of right eye   . Anemia     iron deficiency    HOSPITAL COURSE:   81 year old African-American male with past medical history significant for coronary artery disease, CVA, peripheral vascular disease, CK D admitted to the hospital secondary to dyspnea and acute on chronic renal failure  #1 acute respiratory failure-secondary to acute on chronic diastolic congestive heart failure exacerbation. -Chest x-ray with significant pulmonary edema. -Continue Lasix-now on oral Lasix. -Also on dialysis. -Required BiPAP in the past, currently stable on 2 L oxygen.  -Will need sleep study as outpatient -Echo from August 2016 with EF of 55%  #2 pneumonia-currently on Augmentin. Improving. Follow-up chest x-ray with improvement. Finish off Augmentin for another 4 days.  #3 dysphagia-appreciate speech therapy input. Patient currently on dysphagia diet with thin liquids. -CT of the head without any acute stroke noted.  #4 end-stage renal disease-AV fistula on left forearm placed as outpatient. Since acutely needs dialysis, right IJ permacath placed. -Appreciate nephrology and vascular consults. -Continue to hold Ace-I and ARB at this  time. -Started on hemodialysis and received 4 treatments in the hospital and doing well. -Outpatient dialysis with [facility] nephrology. Next outpatient treatment for 04/19/2015  #5 hypertension-on Imdur, metoprolol and Cardizem Some of other outpatient meds discontinued  #6 atrial fibrillation-rate is controlled with metoprolol and Cardizem. -On warfarin. Follow-up INR and and adjust coumadin dose  Physical therapy consulted. Patient will be discharged back to [facility]  DISCHARGE CONDITIONS:   Guarded  CONSULTS OBTAINED:  Treatment Team:  Lamar Blinks, MD  DRUG ALLERGIES:  No Known Allergies  DISCHARGE MEDICATIONS:   Current Discharge Medication List    START taking these medications   Details  amoxicillin-clavulanate (AUGMENTIN) 500-125 MG tablet Take 1 tablet (500 mg total) by mouth daily. Qty: 5 tablet, Refills: 0    atorvastatin (LIPITOR) 20 MG tablet Take 1 tablet (20 mg total) by mouth daily. Qty: 30 tablet, Refills: 0    diltiazem (CARDIZEM CD) 180 MG 24 hr capsule Take 1 capsule (180 mg total) by mouth daily. Qty: 30 capsule, Refills: 0    warfarin (COUMADIN) 5 MG tablet Take 1 tablet (5 mg total) by mouth daily at 6 PM. Qty: 30 tablet, Refills: 0      CONTINUE these medications which have CHANGED   Details  furosemide (LASIX) 40 MG tablet Take 1 tablet (40 mg total) by mouth 2 (two) times daily. Qty: 30 tablet, Refills: 0    HYDROcodone-acetaminophen (NORCO/VICODIN) 5-325 MG tablet Take 1 tablet by mouth every 6 (six) hours as needed for moderate pain. Qty: 30 tablet, Refills: 0  CONTINUE these medications which have NOT CHANGED   Details  acetaminophen (TYLENOL) 325 MG tablet Take 650 mg by mouth every 6 (six) hours as needed. For pain.    allopurinol (ZYLOPRIM) 100 MG tablet Take 100 mg by mouth daily.    aspirin 81 MG chewable tablet Chew 81 mg by mouth daily.    brimonidine (ALPHAGAN) 0.2 % ophthalmic solution Place 1 drop into the  left eye 2 (two) times daily.    clopidogrel (PLAVIX) 75 MG tablet Take 75 mg by mouth daily.    ferrous sulfate 325 (65 FE) MG EC tablet Take 325 mg by mouth daily.    isosorbide mononitrate (IMDUR) 60 MG 24 hr tablet Take 60 mg by mouth daily.    latanoprost (XALATAN) 0.005 % ophthalmic solution Place 1 drop into both eyes at bedtime.    metoprolol (TOPROL-XL) 200 MG 24 hr tablet Take 200 mg by mouth daily.    miconazole (MICOTIN) 2 % cream Apply 1 application topically daily. Clean and apply to groin    Multiple Vitamins-Minerals (CENTRUM SILVER PO) Take 1 tablet by mouth daily.    pregabalin (LYRICA) 75 MG capsule Take 75 mg by mouth 2 (two) times daily.    senna-docusate (SENOKOT-S) 8.6-50 MG per tablet Take 2 tablets by mouth at bedtime.    sodium bicarbonate 650 MG tablet Take 650 mg by mouth 2 (two) times daily.      STOP taking these medications     amlodipine-atorvastatin (CADUET) 10-20 MG per tablet      hydrALAZINE (APRESOLINE) 25 MG tablet      sodium polystyrene (KAYEXALATE) 15 GM/60ML suspension      aspirin EC 81 MG tablet      ferrous sulfate 325 (65 FE) MG tablet      Trolamine Salicylate 10 % LOTN          DISCHARGE INSTRUCTIONS:   1. INR check in 2-3 days, adjust coumadin dose as needed, goal INR between 2-3 2. [facility] nephrology f/u for dialysis on 04/19/15 3. PCP f/u in 1 week 4. On 2l oxygen  If you experience worsening of your admission symptoms, develop shortness of breath, life threatening emergency, suicidal or homicidal thoughts you must seek medical attention immediately by calling 911 or calling your MD immediately  if symptoms less severe.  You Must read complete instructions/literature along with all the possible adverse reactions/side effects for all the Medicines you take and that have been prescribed to you. Take any new Medicines after you have completely understood and accept all the possible adverse reactions/side effects.   Please  note  You were cared for by a hospitalist during your hospital stay. If you have any questions about your discharge medications or the care you received while you were in the hospital after you are discharged, you can call the unit and asked to speak with the hospitalist on call if the hospitalist that took care of you is not available. Once you are discharged, your primary care physician will handle any further medical issues. Please note that NO REFILLS for any discharge medications will be authorized once you are discharged, as it is imperative that you return to your primary care physician (or establish a relationship with a primary care physician if you do not have one) for your aftercare needs so that they can reassess your need for medications and monitor your lab values.    Today   CHIEF COMPLAINT:   Chief Complaint  Patient presents with  .  Shortness of Breath    VITAL SIGNS:  Blood pressure 120/69, pulse 98, temperature 98 F (36.7 C), temperature source Oral, resp. rate 24, height 6\' 1"  (1.854 m), weight 109 kg (240 lb 4.8 oz), SpO2 92 %.  I/O:   Intake/Output Summary (Last 24 hours) at 04/17/15 1356 Last data filed at 04/17/15 1315  Gross per 24 hour  Intake    600 ml  Output   2000 ml  Net  -1400 ml    PHYSICAL EXAMINATION:   Physical Exam  GENERAL: 81 y.o.-year-old patient lying in the bed with no acute distress.  EYES: Pupils equal, round, reactive to light and accommodation. No scleral icterus. Extraocular muscles intact. Patient is legally blind in right eye HEENT: Head atraumatic, normocephalic. Oropharynx and nasopharynx clear.  NECK: Supple, no jugular venous distention. No thyroid enlargement, no tenderness.  LUNGS: Normal breath sounds bilaterally, decreased bibasilar breath sounds. no wheezing, or rhonchi.No use of accessory muscles of respiration on exertion noted.  CARDIOVASCULAR: S1, S2 normal. No rubs, or gallops. 3/6 systolic murmur  present ABDOMEN: Soft, nontender, nondistended. Bowel sounds present. No organomegaly or mass.  EXTREMITIES: No cyanosis, or clubbing. 1+ pedal edema noted NEUROLOGIC: Cranial nerves II through XII are intact. Muscle strength 5/5 in all extremities. Sensation intact. Gait not checked.  PSYCHIATRIC: The patient is alert and oriented x 2-3.  SKIN: No obvious rash, lesion, or ulcer.   DATA REVIEW:   CBC  Recent Labs Lab 04/17/15 0329  WBC 10.2  HGB 9.7*  HCT 31.3*  PLT 137*    Chemistries   Recent Labs Lab 04/17/15 1128 04/17/15 1129  NA  --  136  K  --  3.9  CL  --  98*  CO2  --  32  GLUCOSE  --  116*  BUN  --  45*  CREATININE  --  3.81*  CALCIUM  --  8.2*  MG 2.3  --     Cardiac Enzymes No results for input(s): TROPONINI in the last 168 hours.  Microbiology Results  Results for orders placed or performed during the hospital encounter of 04/08/15  MRSA PCR Screening     Status: None   Collection Time: 04/09/15  2:27 AM  Result Value Ref Range Status   MRSA by PCR NEGATIVE NEGATIVE Final    Comment:        The GeneXpert MRSA Assay (FDA approved for NASAL specimens only), is one component of a comprehensive MRSA colonization surveillance program. It is not intended to diagnose MRSA infection nor to guide or monitor treatment for MRSA infections.     RADIOLOGY:  Dg Chest 2 View  04/17/2015   CLINICAL DATA:  COPD exacerbation.  Congestive heart failure.  EXAM: CHEST  2 VIEW  COMPARISON:  April 14, 2015  FINDINGS: There is a central catheter with tip the cavoatrial junction. No pneumothorax. There is stable cardiomegaly with pulmonary vascularity within normal limits. Lungs are currently clear except for slight left base atelectasis. There is a small left effusion. No adenopathy. There is degenerative change in the thoracic spine.  IMPRESSION: Mild left base atelectasis is small left effusion. Lungs elsewhere clear. Stable cardiomegaly. No new opacity. No  pneumothorax.   Electronically Signed   By: Bretta Bang III M.D.   On: 04/17/2015 07:41    EKG:   Orders placed or performed during the hospital encounter of 04/08/15  . EKG 12-Lead  . EKG 12-Lead  . ED EKG  . ED EKG  .  EKG 12-Lead  . EKG 12-Lead      Management plans discussed with the patient, family and they are in agreement.  CODE STATUS:     Code Status Orders        Start     Ordered   04/08/15 2032  Full code   Continuous     04/08/15 2031      TOTAL TIME TAKING CARE OF THIS PATIENT: 38 minutes.    Grae Cannata M.D on 04/17/2015 at 1:56 PM  Between 7am to 6pm - Pager - [phone]  After 6pm go to [email or web address] - password [email or web address] [facility]  [facility]  Office  [phone]  CC: Primary care physician; Bobbye Morton, MD

## 2015-04-17 NOTE — Progress Notes (Signed)
PT Cancellation Note  Patient Details Name: Sean Delgado. MRN: [id number] DOB: [birth year]   Cancelled Treatment:    Reason Eval/Treat Not Completed: Patient at procedure or test/unavailable. Treatment attempted; pt at hemodialysis. Re attempt treatment later today as schedule allows.    Elsie Stain Bishop 04/17/2015, 11:39 AM

## 2015-04-17 NOTE — Progress Notes (Signed)
CSW was notified that Pt has been medically cleared for dc back to [facility].  New HD has been confirmed for MWF at [facility] [address] [facility] facility. Pt's PACE social worker notified of dc today, confirmed that Pt will need EMS back to [facility]. CSW prepared dc packet and faxed dc summary to SNF. CSW spoke to Pt's niece and sister to notify them of the dc back to [facility]. Pt's family in agreeement with Pt's return to SNF and is in touch with PACE team.    RN to call report and EMS for transport.   No further CSW needs at this time.   Wilford Grist, LCSW

## 2015-04-17 NOTE — Care Management Note (Signed)
Case Management Note  Patient Details  Name: Mose Colaizzi. MRN: [id number] Date of Birth: [birth year]  Subjective/Objective:     Patient being discharged back to [facility] today. Claudine, CSW notified .             Action/Plan:   Expected Discharge Date:  04/10/15               Expected Discharge Plan:     In-House Referral:     Discharge planning Services     Post Acute Care Choice:    Choice offered to:     DME Arranged:    DME Agency:     HH Arranged:    HH Agency:     Status of Service:     Medicare Important Message Given:  Yes-fourth notification given Date Medicare IM Given:    Medicare IM give by:    Date Additional Medicare IM Given:    Additional Medicare Important Message give by:     If discussed at Long Length of Stay Meetings, dates discussed:    Additional Comments:  Marily Memos, RN 04/17/2015, 2:22 PM

## 2015-04-17 NOTE — Progress Notes (Signed)
ANTICOAGULATION CONSULT NOTE - Initial Consult  Pharmacy Consult for Warfarin Indication: atrial fibrillation  No Known Allergies  Patient Measurements: Height:  (185.4 cm) Weight: 241 lb 6.4 oz (109.498 kg) IBW/kg (Calculated) : 79.9  Vital Signs: Temp: 97.7 F (36.5 C) (10/10 0344) Temp Source: Oral (10/10 0344) BP: 106/83 mmHg (10/10 0344) Pulse Rate: 90 (10/10 0344)  Labs:  Recent Labs  04/14/15 1101  04/15/15 0352 04/15/15 1723 04/16/15 0331 04/17/15 0328  HGB 9.8*  --   --  9.0*  --   --   HCT 31.6*  --   --  29.4*  --   --   PLT 171  --   --  149*  --   --   LABPROT  --   < > 13.9  --  13.5 14.5  INR  --   < > 1.05  --  1.01 1.11  CREATININE 3.33*  --   --  3.32* 2.62* 3.63*  < > = values in this interval not displayed.  Estimated Creatinine Clearance: 21.8 mL/min (by C-G formula based on Cr of 3.63).   Medical History: Past Medical History  Diagnosis Date  . Chronic renal failure, stage 4 (severe) (HCC)   . Hypertension   . CHF (congestive heart failure) (HCC)   . Gout   . Glaucoma   . Neuropathy (HCC)   . Stroke (HCC)   . Peripheral vascular disease (HCC)   . Coronary artery disease   . A-fib (HCC)   . Dysrhythmia   . Blindness of right eye   . Anemia     iron deficiency    Medications:  Scheduled:  . albuterol  5 mg Nebulization Once  . allopurinol  100 mg Oral Daily  . amoxicillin-clavulanate  1 tablet Oral Q24H  . aspirin EC  81 mg Oral Daily  . atorvastatin  20 mg Oral Daily  . brimonidine  1 drop Left Eye BID  . diltiazem  180 mg Oral Daily  . ferrous sulfate  325 mg Oral BID  . furosemide  40 mg Oral BID  . heparin  5,000 Units Subcutaneous 3 times per day  . Influenza vac split quadrivalent PF  0.5 mL Intramuscular Tomorrow-1000  . isosorbide mononitrate  60 mg Oral Daily  . latanoprost  1 drop Both Eyes QHS  . metoprolol  100 mg Oral 3 times per day  . multivitamin with minerals   Oral Daily  . pregabalin  75 mg Oral BID   . senna-docusate  2 tablet Oral QHS  . sodium bicarbonate  650 mg Oral BID  . sodium chloride  3 mL Intravenous Q12H  . tolnaftate   Topical BID  . trolamine salicylate  1 application Topical QID  . warfarin  5 mg Oral q1800  . Warfarin - Pharmacist Dosing Inpatient   Does not apply q1800    Assessment: Pharmacy consulted to initiate warfarin in a 81 yo male with afib.  Patient also has orders for heparin 5000 units subq q8h.   10/7 - INR: 0.96, coumadin 2.5mg  given 10/8 - INR: 1.05, coumadin 2.5mg  ordered - not given 10/9 - INR: 1.01  Goal of Therapy:  INR 2-3 Monitor CBC per policy   Plan:  Current orders to for warfarin 2.5 mg daily. Discussed with MD, would like to increase to  daily until INR moves significantly. Of note, patient ordered warfarin yesterday but was not given (documented as patient not available).   Patient is receiving  Augmentin 500 mg po daily.  Per notes, patient with anemia and also receiving aspirin and clopidogrel therapy.    Orderd coumadin  x 1 today. Will check INR in AM 0 MD would like to continue  daily until INR moves. Will follow INR closely.   10/10 AM INR 1.11. Warfarin 5 mg daily and PT/INR daily ordered.   Pharmacy will continue to follow.  Garlon Hatchet, PharmD Clinical Pharmacist  04/17/2015 5:13 AM

## 2015-04-17 NOTE — Progress Notes (Signed)
Called report to [facility].  Patient to go via ems.

## 2015-04-17 NOTE — Progress Notes (Signed)
Disposition note. This patient will be going by EMS to [facility]. He has agreed to take his medications and resume his life activities at the care facility.

## 2015-04-17 NOTE — Progress Notes (Signed)
PT Cancellation Note  Patient Details Name: Sean Delgado. MRN: [id number] DOB: 09/29/1933   Cancelled Treatment:    Reason Eval/Treat Not Completed: Patient at procedure or test/unavailable. Treatment attempted again this pm.; pt continues to be out for hemodialysis. Re attempt treatment tomorrow.   Elsie Stain Bishop 04/17/2015, 2:06 PM

## 2015-05-16 ENCOUNTER — Emergency Department
Admission: EM | Admit: 2015-05-16 | Discharge: 2015-05-16 | Disposition: A | Discharge status: Home / Self Care | Payer: Medicare (Managed Care) | Source: Home / Self Care | Attending: Emergency Medicine | Admitting: Emergency Medicine

## 2015-05-16 ENCOUNTER — Encounter: Payer: Self-pay | Admitting: Intensive Care

## 2015-05-16 ENCOUNTER — Emergency Department: Payer: Medicare (Managed Care)

## 2015-05-16 ENCOUNTER — Emergency Department
Admission: EM | Admit: 2015-05-16 | Discharge: 2015-05-17 | Disposition: A | Discharge status: Home / Self Care | Payer: Medicare (Managed Care) | Attending: Emergency Medicine | Admitting: Emergency Medicine

## 2015-05-16 DIAGNOSIS — Z792 Long term (current) use of antibiotics: Secondary | ICD-10-CM | POA: Insufficient documentation

## 2015-05-16 DIAGNOSIS — Z87891 Personal history of nicotine dependence: Secondary | ICD-10-CM

## 2015-05-16 DIAGNOSIS — Z7901 Long term (current) use of anticoagulants: Secondary | ICD-10-CM | POA: Insufficient documentation

## 2015-05-16 DIAGNOSIS — Z992 Dependence on renal dialysis: Secondary | ICD-10-CM

## 2015-05-16 DIAGNOSIS — N186 End stage renal disease: Secondary | ICD-10-CM | POA: Insufficient documentation

## 2015-05-16 DIAGNOSIS — L089 Local infection of the skin and subcutaneous tissue, unspecified: Secondary | ICD-10-CM | POA: Diagnosis present

## 2015-05-16 DIAGNOSIS — M25551 Pain in right hip: Secondary | ICD-10-CM | POA: Diagnosis not present

## 2015-05-16 DIAGNOSIS — Z79899 Other long term (current) drug therapy: Secondary | ICD-10-CM | POA: Insufficient documentation

## 2015-05-16 DIAGNOSIS — I120 Hypertensive chronic kidney disease with stage 5 chronic kidney disease or end stage renal disease: Secondary | ICD-10-CM | POA: Insufficient documentation

## 2015-05-16 DIAGNOSIS — T8249XA Other complication of vascular dialysis catheter, initial encounter: Secondary | ICD-10-CM

## 2015-05-16 DIAGNOSIS — Z7902 Long term (current) use of antithrombotics/antiplatelets: Secondary | ICD-10-CM

## 2015-05-16 DIAGNOSIS — M79651 Pain in right thigh: Secondary | ICD-10-CM | POA: Diagnosis not present

## 2015-05-16 DIAGNOSIS — Z7982 Long term (current) use of aspirin: Secondary | ICD-10-CM | POA: Insufficient documentation

## 2015-05-16 DIAGNOSIS — Y658 Other specified misadventures during surgical and medical care: Secondary | ICD-10-CM

## 2015-05-16 DIAGNOSIS — T8241XA Breakdown (mechanical) of vascular dialysis catheter, initial encounter: Secondary | ICD-10-CM

## 2015-05-16 LAB — CBC WITH DIFFERENTIAL/PLATELET
BASOS ABS: 0.1 10*3/uL (ref 0–0.1)
Basophils Relative: 1 %
EOS PCT: 1 %
Eosinophils Absolute: 0.1 10*3/uL (ref 0–0.7)
HCT: 31.3 % — ABNORMAL LOW (ref 40.0–52.0)
HEMOGLOBIN: 9.9 g/dL — AB (ref 13.0–18.0)
LYMPHS ABS: 1.2 10*3/uL (ref 1.0–3.6)
LYMPHS PCT: 11 %
MCH: 26.9 pg (ref 26.0–34.0)
MCHC: 31.7 g/dL — ABNORMAL LOW (ref 32.0–36.0)
MCV: 84.8 fL (ref 80.0–100.0)
Monocytes Absolute: 0.5 10*3/uL (ref 0.2–1.0)
Monocytes Relative: 4 %
NEUTROS PCT: 83 %
Neutro Abs: 8.6 10*3/uL — ABNORMAL HIGH (ref 1.4–6.5)
PLATELETS: 248 10*3/uL (ref 150–440)
RBC: 3.69 MIL/uL — AB (ref 4.40–5.90)
RDW: 19.2 % — ABNORMAL HIGH (ref 11.5–14.5)
WBC: 10.4 10*3/uL (ref 3.8–10.6)

## 2015-05-16 LAB — BASIC METABOLIC PANEL
ANION GAP: 8 (ref 5–15)
BUN: 73 mg/dL — ABNORMAL HIGH (ref 6–20)
CHLORIDE: 100 mmol/L — AB (ref 101–111)
CO2: 28 mmol/L (ref 22–32)
Calcium: 8.1 mg/dL — ABNORMAL LOW (ref 8.9–10.3)
Creatinine, Ser: 3.85 mg/dL — ABNORMAL HIGH (ref 0.61–1.24)
GFR calc Af Amer: 16 mL/min — ABNORMAL LOW (ref >60)
GFR, EST NON AFRICAN AMERICAN: 14 mL/min — AB (ref >60)
Glucose, Bld: 131 mg/dL — ABNORMAL HIGH (ref 65–99)
POTASSIUM: 4 mmol/L (ref 3.5–5.1)
SODIUM: 136 mmol/L (ref 135–145)

## 2015-05-16 LAB — LACTIC ACID, PLASMA: LACTIC ACID, VENOUS: 1.4 mmol/L (ref 0.5–2.0)

## 2015-05-16 NOTE — ED Notes (Signed)
Patient was sent here due to problem flushing port at dialysis. I flushed both sides of the double lumen port with MD at bedside with no problems. Patient denies SOB or pain.

## 2015-05-16 NOTE — ED Notes (Signed)
Spoke with NP at [facility] regarding care for [name]. She stated he had X-rays were completed at facility and no need to repeat the same test. MD made aware. MD has put in discharge orders for patient to return to [facility]. NP relayed he will need to come back to facility by EMS. Secretary is contacting EMS now for transport.

## 2015-05-16 NOTE — ED Notes (Signed)
Patient arrived by EMS from [facility]. RN staff reports port will not flush. Denies pain or SOB

## 2015-05-16 NOTE — ED Provider Notes (Signed)
[facility] Emergency Department Provider Note   ____________________________________________  Time seen: 1320  I have reviewed the triage vital signs and the nursing notes.   HISTORY  Chief Complaint Vascular Access Problem   History limited by: Not Limited   HPI Sean Kirsh. is a 81 y.o. male with end-stage renal disease on dialysis via a right chest catheter who presents today because of concerns that the catheter won't flush. The patient states he last received dialysis 4 days ago. He denies any chest pain or shortness of breath. Denies any fevers. He denies any nausea vomiting or diarrhea. He denies any lower extremity swelling.     Past Medical History  Diagnosis Date  . Chronic renal failure, stage 4 (severe) (HCC)   . Hypertension   . CHF (congestive heart failure) (HCC)   . Gout   . Glaucoma   . Neuropathy (HCC)   . Stroke (HCC)   . Peripheral vascular disease (HCC)   . Coronary artery disease   . A-fib (HCC)   . Dysrhythmia   . Blindness of right eye   . Anemia     iron deficiency    Patient Active Problem List   Diagnosis Date Noted  . Acute respiratory failure (HCC) 04/08/2015  . CHF (congestive heart failure) (HCC) 03/04/2015    Past Surgical History  Procedure Laterality Date  . Vascular surgery Bilateral     Carotid Endarterectomy  . Eye surgery Left     Cataract Extraction  . Av fistula placement Left 04/06/2015    Procedure: ARTERIOVENOUS (AV) FISTULA CREATION;  Surgeon: Annice Needy, MD;  Location: [facility] ORS;  Service: Vascular;  Laterality: Left;  . Peripheral vascular catheterization N/A 04/12/2015    Procedure: Dialysis/Perma Catheter Insertion;  Surgeon: Annice Needy, MD;  Location: [facility] INVASIVE CV LAB;  Service: Cardiovascular;  Laterality: N/A;    Current Outpatient Rx  Name  Route  Sig  Dispense  Refill  . acetaminophen (TYLENOL) 325 MG tablet   Oral   Take 650 mg by mouth every 6 (six) hours as needed.  For pain.         Marland Kitchen allopurinol (ZYLOPRIM) 100 MG tablet   Oral   Take 100 mg by mouth daily.         Marland Kitchen amoxicillin-clavulanate (AUGMENTIN) 500-125 MG tablet   Oral   Take 1 tablet (500 mg total) by mouth daily.   5 tablet   0   . aspirin 81 MG chewable tablet   Oral   Chew 81 mg by mouth daily.         Marland Kitchen atorvastatin (LIPITOR) 20 MG tablet   Oral   Take 1 tablet (20 mg total) by mouth daily.   30 tablet   0   . brimonidine (ALPHAGAN) 0.2 % ophthalmic solution   Left Eye   Place 1 drop into the left eye 2 (two) times daily.         . clopidogrel (PLAVIX) 75 MG tablet   Oral   Take 75 mg by mouth daily.         Marland Kitchen diltiazem (CARDIZEM CD) 180 MG 24 hr capsule   Oral   Take 1 capsule (180 mg total) by mouth daily.   30 capsule   0   . ferrous sulfate 325 (65 FE) MG EC tablet   Oral   Take 325 mg by mouth daily.         . furosemide (LASIX) 40  MG tablet   Oral   Take 1 tablet (40 mg total) by mouth 2 (two) times daily.   30 tablet   0   . HYDROcodone-acetaminophen (NORCO/VICODIN) 5-325 MG tablet   Oral   Take 1 tablet by mouth every 6 (six) hours as needed for moderate pain.   30 tablet   0   . isosorbide mononitrate (IMDUR) 60 MG 24 hr tablet   Oral   Take 60 mg by mouth daily.         Marland Kitchen. latanoprost (XALATAN) 0.005 % ophthalmic solution   Both Eyes   Place 1 drop into both eyes at bedtime.         . metoprolol (TOPROL-XL) 200 MG 24 hr tablet   Oral   Take 200 mg by mouth daily.         . miconazole (MICOTIN) 2 % cream   Topical   Apply 1 application topically daily. Clean and apply to groin         . Multiple Vitamins-Minerals (CENTRUM SILVER PO)   Oral   Take 1 tablet by mouth daily.         . pregabalin (LYRICA) 75 MG capsule   Oral   Take 75 mg by mouth 2 (two) times daily.         Marland Kitchen. senna-docusate (SENOKOT-S) 8.6-50 MG per tablet   Oral   Take 2 tablets by mouth at bedtime.         . sodium bicarbonate 650 MG  tablet   Oral   Take 650 mg by mouth 2 (two) times daily.         Marland Kitchen. warfarin (COUMADIN) 5 MG tablet   Oral   Take 1 tablet (5 mg total) by mouth daily at 6 PM.   30 tablet   0     Allergies Review of patient's allergies indicates no known allergies.  Family History  Problem Relation Age of Onset  . Diabetes Mother     Social History Social History  Substance Use Topics  . Smoking status: Former Smoker -- 0.25 packs/day for 15 years    Types: Cigarettes    Quit date: 09/06/2006  . Smokeless tobacco: Never Used  . Alcohol Use: No    Review of Systems  Constitutional: Negative for fever. Cardiovascular: Negative for chest pain. Respiratory: Negative for shortness of breath. Gastrointestinal: Negative for abdominal pain, vomiting and diarrhea. Genitourinary: Negative for dysuria. Musculoskeletal: Negative for back pain. Skin: Negative for rash. Neurological: Negative for headaches, focal weakness or numbness.  10-point ROS otherwise negative.  ____________________________________________   PHYSICAL EXAM:  VITAL SIGNS:   98.3 F (36.8 C)   105   21   151/70 mmHg  94 %     Constitutional: Alert and oriented. Well appearing and in no distress. Eyes: Conjunctivae are normal. PERRL. Normal extraocular movements. ENT   Head: Normocephalic and atraumatic.   Nose: No congestion/rhinnorhea.   Mouth/Throat: Mucous membranes are moist.   Neck: No stridor. Hematological/Lymphatic/Immunilogical: No cervical lymphadenopathy. Cardiovascular: Normal rate, regular rhythm.  No murmurs, rubs, or gallops. Respiratory: Normal respiratory effort without tachypnea nor retractions. Breath sounds are clear and equal bilaterally. No wheezes/rales/rhonchi. Gastrointestinal: Soft and nontender. No distention.  Genitourinary: Deferred Musculoskeletal: Normal range of motion in all extremities. No joint effusions.  No lower extremity tenderness nor edema. Neurologic:   Normal speech and language. No gross focal neurologic deficits are appreciated.  Skin:  Skin is warm, dry and intact. Hemodialysis catheter  present in the right upper chest. Psychiatric: Mood and affect are normal. Speech and behavior are normal. Patient exhibits appropriate insight and judgment.  ____________________________________________    LABS (pertinent positives/negatives)  Labs Reviewed  BASIC METABOLIC PANEL - Abnormal; Notable for the following:    Chloride 100 (*)    Glucose, Bld 131 (*)    BUN 73 (*)    Creatinine, Ser 3.85 (*)    Calcium 8.1 (*)    GFR calc non Af Amer 14 (*)    GFR calc Af Amer 16 (*)    All other components within normal limits     ____________________________________________   EKG  None  ____________________________________________    RADIOLOGY  None   ____________________________________________   PROCEDURES  Procedure(s) performed: None  Critical Care performed: No  ____________________________________________   INITIAL IMPRESSION / ASSESSMENT AND PLAN / ED COURSE  Pertinent labs & imaging results that were available during my care of the patient were reviewed by me and considered in my medical decision making (see chart for details).  Patient presented to the emergency department today via EMS because of concerns that the hemodialysis catheter was not working. Nurse was able to flush. Patient himself denied any initial complaints however at the time of discharge stated his right leg was hurting. Per family the patient has undergone imaging at the outside facility. At this point will allow outside facility to continue workup.  ____________________________________________   FINAL CLINICAL IMPRESSION(S) / ED DIAGNOSES  Final diagnoses:  Hemodialysis catheter dysfunction, initial encounter [facility])     Phineas Semen, MD 05/16/15 1610

## 2015-05-16 NOTE — ED Provider Notes (Signed)
[facility] Emergency Department Provider Note    ____________________________________________  Time seen: 2045  I have reviewed the triage vital signs and the nursing notes.   HISTORY  Chief Complaint Wound Infection   History limited by: Not Limited   HPI Lowell BoutonHolt Woodfin Jr. is a 81 y.o. male who really presents to the emergency department. I saw the patient earlier on my shift because of concerns for port malfunction. After the patient had been discharged a got a call from the nephrologist. He had additional information from the patient's time at the dialysis center. He stated that the patient in fact spiked a fever and was given a dose of vancomycin was sent here for infectious workup. None of this history was offered by the patient on his initial presentation nor was it related to staff or myself. The patient did not have any infectious symptoms at the prior presentation. Additionally again patient's only complaints of continued right hip pain   Past Medical History  Diagnosis Date  . Chronic renal failure, stage 4 (severe) (HCC)   . Hypertension   . CHF (congestive heart failure) (HCC)   . Gout   . Glaucoma   . Neuropathy (HCC)   . Stroke (HCC)   . Peripheral vascular disease (HCC)   . Coronary artery disease   . A-fib (HCC)   . Dysrhythmia   . Blindness of right eye   . Anemia     iron deficiency    Patient Active Problem List   Diagnosis Date Noted  . Acute respiratory failure (HCC) 04/08/2015  . CHF (congestive heart failure) (HCC) 03/04/2015    Past Surgical History  Procedure Laterality Date  . Vascular surgery Bilateral     Carotid Endarterectomy  . Eye surgery Left     Cataract Extraction  . Av fistula placement Left 04/06/2015    Procedure: ARTERIOVENOUS (AV) FISTULA CREATION;  Surgeon: Annice NeedyJason S Dew, MD;  Location: [facility] ORS;  Service: Vascular;  Laterality: Left;  . Peripheral vascular catheterization N/A 04/12/2015    Procedure:  Dialysis/Perma Catheter Insertion;  Surgeon: Annice NeedyJason S Dew, MD;  Location: [facility] INVASIVE CV LAB;  Service: Cardiovascular;  Laterality: N/A;    Current Outpatient Rx  Name  Route  Sig  Dispense  Refill  . acetaminophen (TYLENOL) 325 MG tablet   Oral   Take 650 mg by mouth every 6 (six) hours as needed. For pain.         Marland Kitchen. allopurinol (ZYLOPRIM) 100 MG tablet   Oral   Take 100 mg by mouth daily.         Marland Kitchen. amoxicillin-clavulanate (AUGMENTIN) 500-125 MG tablet   Oral   Take 1 tablet (500 mg total) by mouth daily.   5 tablet   0   . aspirin 81 MG chewable tablet   Oral   Chew 81 mg by mouth daily.         Marland Kitchen. atorvastatin (LIPITOR) 20 MG tablet   Oral   Take 1 tablet (20 mg total) by mouth daily.   30 tablet   0   . brimonidine (ALPHAGAN) 0.2 % ophthalmic solution   Left Eye   Place 1 drop into the left eye 2 (two) times daily.         . clopidogrel (PLAVIX) 75 MG tablet   Oral   Take 75 mg by mouth daily.         Marland Kitchen. diltiazem (CARDIZEM CD) 180 MG 24 hr capsule  Oral   Take 1 capsule (180 mg total) by mouth daily.   30 capsule   0   . ferrous sulfate 325 (65 FE) MG EC tablet   Oral   Take 325 mg by mouth daily.         . furosemide (LASIX) 40 MG tablet   Oral   Take 1 tablet (40 mg total) by mouth 2 (two) times daily.   30 tablet   0   . HYDROcodone-acetaminophen (NORCO/VICODIN) 5-325 MG tablet   Oral   Take 1 tablet by mouth every 6 (six) hours as needed for moderate pain.   30 tablet   0   . isosorbide mononitrate (IMDUR) 60 MG 24 hr tablet   Oral   Take 60 mg by mouth daily.         Marland Kitchen latanoprost (XALATAN) 0.005 % ophthalmic solution   Both Eyes   Place 1 drop into both eyes at bedtime.         . metoprolol (TOPROL-XL) 200 MG 24 hr tablet   Oral   Take 200 mg by mouth daily.         . miconazole (MICOTIN) 2 % cream   Topical   Apply 1 application topically daily. Clean and apply to groin         . Multiple Vitamins-Minerals  (CENTRUM SILVER PO)   Oral   Take 1 tablet by mouth daily.         . pregabalin (LYRICA) 75 MG capsule   Oral   Take 75 mg by mouth 2 (two) times daily.         Marland Kitchen senna-docusate (SENOKOT-S) 8.6-50 MG per tablet   Oral   Take 2 tablets by mouth at bedtime.         . sodium bicarbonate 650 MG tablet   Oral   Take 650 mg by mouth 2 (two) times daily.         Marland Kitchen warfarin (COUMADIN) 5 MG tablet   Oral   Take 1 tablet (5 mg total) by mouth daily at 6 PM.   30 tablet   0     Allergies Review of patient's allergies indicates no known allergies.  Family History  Problem Relation Age of Onset  . Diabetes Mother     Social History Social History  Substance Use Topics  . Smoking status: Former Smoker -- 0.25 packs/day for 15 years    Types: Cigarettes    Quit date: 09/06/2006  . Smokeless tobacco: Never Used  . Alcohol Use: No    Review of Systems  Constitutional: Negative for fever. Cardiovascular: Negative for chest pain. Respiratory: Negative for shortness of breath. Gastrointestinal: Negative for abdominal pain, vomiting and diarrhea. Genitourinary: Negative for dysuria. Musculoskeletal: Negative for back pain. Positive for right hip and thigh pain Skin: Negative for rash. Neurological: Negative for headaches, focal weakness or numbness.  10-point ROS otherwise negative.  ____________________________________________   PHYSICAL EXAM:  VITAL SIGNS: ED Triage Vitals  Enc Vitals Group     BP 05/16/15 2037 156/84 mmHg     Pulse Rate 05/16/15 2037 99     Resp 05/16/15 2037 18     Temp 05/16/15 2037 98.5 F (36.9 C)     Temp src --      SpO2 05/16/15 2037 98 %   Constitutional: Alert and oriented. Well appearing and in no distress. Eyes: Conjunctivae are normal. PERRL. Normal extraocular movements. ENT   Head: Normocephalic and atraumatic.  Nose: No congestion/rhinnorhea.   Mouth/Throat: Mucous membranes are moist.   Neck: No  stridor. Hematological/Lymphatic/Immunilogical: No cervical lymphadenopathy. Cardiovascular: Normal rate, regular rhythm.  No murmurs, rubs, or gallops. Respiratory: Normal respiratory effort without tachypnea nor retractions. Breath sounds are clear and equal bilaterally. No wheezes/rales/rhonchi. Gastrointestinal: Soft and nontender. No distention.  Genitourinary: Deferred Musculoskeletal: Normal range of motion in all extremities. No joint effusions.   Neurologic:  Normal speech and language. No gross focal neurologic deficits are appreciated.  Skin:  Skin is warm, dry and intact. Dialysis port in her right chest Psychiatric: Mood and affect are normal. Speech and behavior are normal. Patient exhibits appropriate insight and judgment.  ____________________________________________    LABS (pertinent positives/negatives)  Labs Reviewed  CBC WITH DIFFERENTIAL/PLATELET - Abnormal; Notable for the following:    RBC 3.69 (*)    Hemoglobin 9.9 (*)    HCT 31.3 (*)    MCHC 31.7 (*)    RDW 19.2 (*)    Neutro Abs 8.6 (*)    All other components within normal limits  LACTIC ACID, PLASMA  LACTIC ACID, PLASMA     ____________________________________________   EKG  I, Phineas Semen, attending physician, personally viewed and interpreted this EKG  EKG Time: 2040 Rate: 86 Rhythm: Normal Sinus rhythm with PVC Axis: normal Intervals: qtc 417 QRS: narrow, q waves V3 ST changes: no st elevation Impression: normal ekg ____________________________________________    RADIOLOGY  CXR IMPRESSION: Limited left basilar assessment. Lungs otherwise clear.  ____________________________________________   PROCEDURES  Procedure(s) performed: None  Critical Care performed: No  ____________________________________________   INITIAL IMPRESSION / ASSESSMENT AND PLAN / ED COURSE  Pertinent labs & imaging results that were available during my care of the patient were reviewed by me  and considered in my medical decision making (see chart for details).  Patient was brought back to the emergency room department because of information that there was some concern for sepsis. Patient without a leukocytosis or elevated white blood cell count. Continues to be afebrile. Not tachycardic. Blood pressure stable. At this point no signs of any infection. No signs of cellulitis around the site of the port. Will discharge back to facility.  ____________________________________________   FINAL CLINICAL IMPRESSION(S) / ED DIAGNOSES  Final diagnoses:  Dialysis patient [facility])     Phineas Semen, MD 05/17/15 1544

## 2015-05-16 NOTE — ED Notes (Signed)
PT BIB EMS TO EVAL FOR DIALYSIS SHUNT INFECTION.

## 2015-05-16 NOTE — Discharge Instructions (Signed)
Please seek medical attention for any high fevers, chest pain, shortness of breath, change in behavior, persistent vomiting, bloody stool or any other new or concerning symptoms. ° ° ° °Dialysis °Dialysis is a procedure that replaces some of the work healthy kidneys do. It is done when you lose about 85-90% of your kidney function. It may also be done earlier if your symptoms may be improved by dialysis. During dialysis, wastes, salt, and extra water are removed from the blood, and the levels of certain chemicals in the blood (such as potassium) are maintained. Dialysis is done in sessions. Dialysis sessions are continued until the kidneys get better. If the kidneys cannot get better, such as in end-stage kidney disease, dialysis is continued for life or until you receive a new kidney (kidney transplant). There are two types of dialysis: hemodialysis and peritoneal dialysis. °WHAT IS HEMODIALYSIS?  °Hemodialysis is a type of dialysis in which a machine called a dialyzer is used to filter the blood. Before beginning hemodialysis, you will have surgery to create a site where blood can be removed from the body and returned to the body (vascular access). There are three types of vascular accesses: °· Arteriovenous fistula. To create this type of access, an artery is connected to a vein (usually in the arm). A fistula takes 1-6 months to develop after surgery. If it develops properly, it usually lasts longer than the other types of vascular accesses. It is also less likely to become infected and cause blood clots. °· Arteriovenous graft. To create this type of access, an artery and a vein in the arm are connected with a tube. A graft may be used within 2-3 weeks of surgery. °· A venous catheter. To create this type of access, a thin, flexible tube (catheter) is placed in a large vein in your neck, chest, or groin. A catheter may be used right away. It is usually used as a temporary access when dialysis needs to begin  immediately. °During hemodialysis, blood leaves the body through your access. It travels through a tube to the dialyzer, where it is filtered. The blood then returns to your body through another tube. °Hemodialysis is usually performed by a health care provider at a hospital or dialysis center three times a week. Visits last about 3-4 hours. It may also be performed with the help of another person at home with training.  °WHAT IS PERITONEAL DIALYSIS? °Peritoneal dialysis is a type of dialysis in which the thin lining of the abdomen (peritoneum) is used as a filter. Before beginning peritoneal dialysis, you will have surgery to place a catheter in your abdomen. The catheter will be used to transfer a fluid called dialysate to and from your abdomen. At the start of a session, your abdomen is filled with dialysate. During the session, wastes, salt, and extra water in the blood pass through the peritoneum and into the dialysate. The dialysate is drained from the body at the end of the session. The process of filling and draining the dialysate is called an exchange. Exchanges are repeated until you have used up all the dialysate for the day. °Peritoneal dialysis may be performed by you at home or at almost any other location. It is done every day. You may need up to five exchanges a day. The amount of time the dialysate is in your body between exchanges is called a dwell. The dwell depends on the number of exchanges needed and the characteristics of the peritoneum. It usually varies from   1.5-3 hours. You may go about your day normally between exchanges. Alternately, the exchanges may be done at night while you sleep, using a machine called a cycler. °WHICH TYPE OF DIALYSIS SHOULD I CHOOSE?  °Both hemodialysis and peritoneal dialysis have advantages and disadvantages. Talk to your health care provider about which type of dialysis would be best for you. Your lifestyle and preferences should be considered along with your  medical condition. In some cases, only one type of dialysis may be an option.  °Advantages of hemodialysis °· It is done less often than peritoneal dialysis. °· Someone else can do the dialysis for you. °· If you go to a dialysis center, your health care provider will be able to recognize any problems right away. °· If you go to a dialysis center, you can interact with others who are having dialysis. This can provide you with emotional support. °Disadvantages of hemodialysis °· Hemodialysis may cause cramps and low blood pressure. It may leave you feeling tired on the days you have the treatment. °· If you go to a dialysis center, you will need to make weekly appointments and work around the center's schedule. °· You will need to take extra care when traveling. If you go to a dialysis center, you will need to make special arrangements to visit a dialysis center near your destination. If you are having treatments at home, you will need to take the dialyzer with you to your destination. °· You will need to avoid more foods than you would need to avoid on peritoneal dialysis. °Advantages of peritoneal dialysis °· It is less likely than hemodialysis to cause cramps and low blood pressure. °· You may do exchanges on your own wherever you are, including when you travel. °· You do not need to avoid as many foods as you do on hemodialysis. °Disadvantages of peritoneal dialysis °· It is done more often than hemodialysis. °· Performing peritoneal dialysis requires you to have dexterity of the hands. You must also be able to lift bags. °· You will have to learn sterilization techniques. You will need to practice them every day to reduce the risk of infection.  °WHAT CHANGES WILL I NEED TO MAKE TO MY DIET DURING DIALYSIS? °Both hemodialysis and peritoneal dialysis require you to make some changes to your diet. For example, you will need to limit your intake of foods high in the minerals phosphorus and potassium. You will also  need to limit your fluid intake. Your dietitian can help you plan meals. A good meal plan can improve your dialysis and your health.  °WHAT SHOULD I EXPECT WHEN BEGINNING DIALYSIS? °Adjusting to the dialysis treatment, schedule, and diet can take some time. You may need to stop working and may not be able to do some of the things you normally do. You may feel anxious or depressed when beginning dialysis. Eventually, many people feel better overall because of dialysis. Some people are able to return to work after making some changes, such as reducing work intensity. °WHERE CAN I FIND MORE INFORMATION?  °· National Kid[employer] Foundation: www.kidney.org °· American Association of Kidney Patients: www.aakp.org °· [employer]: www.kidneyfund.org °  °This information is not intended to replace advice given to you by your health care provider. Make sure you discuss any questions you have with your health care provider. °  °Document Released: 09/14/2002 Document Revised: 07/15/2014 Document Reviewed: 08/18/2012 °[employer]vier Interactive Patient Education ©2016 [employer] ° °

## 2015-05-16 NOTE — Discharge Instructions (Signed)
Please seek medical attention for any high fevers, chest pain, shortness of breath, change in behavior, persistent vomiting, bloody stool or any other new or concerning symptoms.   Dialysis Vascular Access Malfunction A vascular access is an entrance to your blood vessels that can be used for dialysis. A vascular access can be made in one of several ways:   Joining an artery to a vein under your skin to make a bigger blood vessel called a fistula.   Joining an artery to a vein under your skin using a soft tube called a graft.   Placing a thin, flexible tube (catheter) in a large vein, usually in your neck.  A vascular access may malfunction or become blocked.  WHAT CAN CAUSE YOUR VASCULAR ACCESS TO MALFUNCTION?  Infection (common).   A blood clot inside a part of the fistula, graft, or catheter. A blood clot can completely or partially block the flow of blood.   A kink in the graft or catheter.   A collection of blood (called a hematoma or bruise) next to the graft or catheter that pushes against it, blocking the flow of blood.  WHAT ARE SIGNS AND SYMPTOMS OF VASCULAR ACCESS MALFUNCTION?  There is a change in the vibration or pulse of your fistula or graft.  The vibration or pulse of your fistula or graft is gone.   There is new or unusual swelling of the area around the access.   There was an unsuccessful puncture of your access by the dialysis team.   The flow of blood through the fistula, graft, or catheter is too slow for effective dialysis.   When routine dialysis is completed and the needle is removed, bleeding lasts for too long a time.  WHAT HAPPENS IF MY VASCULAR ACCESS MALFUNCTIONS? Your health care provider may order blood work, cultures, or an X-ray test in order to learn what may be wrong with your vascular access. The X-ray test involves the injection of a liquid into the vascular access. The liquid shows up on the X-ray and allows your health care  provider to see if there is a blockage in the vascular access.  Treatment varies depending on the cause of the malfunction:   If the vascular access is infected, your health care provider may prescribe antibiotic medicine to control the infection.   If a clot is found in the vascular access, you may need surgery to remove the clot.   If a blockage in the vascular access is due to some other cause (such as a kink in a graft), then you will likely need surgery to unblock or replace the graft.  HOME CARE INSTRUCTIONS: Follow up with your surgeon or other health care provider if you were instructed to do so. This is very important. Any delay in follow-up could cause permanent dysfunction of the vascular access, which may be dangerous.  SEEK MEDICAL CARE IF:   Fever develops.   Swelling and pain around the vascular access gets worse or new pain develops.  Pain, numbness, or an unusual pale skin color develops in the hand on the side of your vascular access. SEEK IMMEDIATE MEDICAL CARE IF: Unusual bleeding develops at the location of the vascular access. MAKE SURE YOU:  Understand these instructions.  Will watch your condition.  Will get help right away if you are not doing well or get worse.   This information is not intended to replace advice given to you by your health care provider. Make sure you  discuss any questions you have with your health care provider.   Document Released: 05/27/2006 Document Revised: 07/15/2014 Document Reviewed: 11/26/2012 Elsevier Interactive Patient Education [employer].

## 2015-05-17 ENCOUNTER — Other Ambulatory Visit: Payer: Self-pay | Admitting: Vascular Surgery

## 2015-05-17 NOTE — ED Notes (Signed)
Patient denies pain and is resting comfortably.  

## 2015-05-17 NOTE — ED Notes (Signed)
Patient denies pain and is resting comfortably. No needs identified

## 2015-05-18 ENCOUNTER — Ambulatory Visit
Admission: RE | Admit: 2015-05-18 | Discharge: 2015-05-18 | Disposition: A | Discharge status: Home / Self Care | Payer: Medicare (Managed Care) | Source: Ambulatory Visit | Attending: Vascular Surgery | Admitting: Vascular Surgery

## 2015-05-18 ENCOUNTER — Encounter
Admission: RE | Disposition: A | Discharge status: Home / Self Care | Payer: Self-pay | Source: Ambulatory Visit | Attending: Vascular Surgery

## 2015-05-18 DIAGNOSIS — I6529 Occlusion and stenosis of unspecified carotid artery: Secondary | ICD-10-CM | POA: Diagnosis not present

## 2015-05-18 DIAGNOSIS — I2510 Atherosclerotic heart disease of native coronary artery without angina pectoris: Secondary | ICD-10-CM | POA: Insufficient documentation

## 2015-05-18 DIAGNOSIS — I120 Hypertensive chronic kidney disease with stage 5 chronic kidney disease or end stage renal disease: Secondary | ICD-10-CM | POA: Insufficient documentation

## 2015-05-18 DIAGNOSIS — Z87891 Personal history of nicotine dependence: Secondary | ICD-10-CM | POA: Insufficient documentation

## 2015-05-18 DIAGNOSIS — M109 Gout, unspecified: Secondary | ICD-10-CM | POA: Insufficient documentation

## 2015-05-18 DIAGNOSIS — Z992 Dependence on renal dialysis: Secondary | ICD-10-CM | POA: Insufficient documentation

## 2015-05-18 DIAGNOSIS — H5441 Blindness, right eye, normal vision left eye: Secondary | ICD-10-CM | POA: Insufficient documentation

## 2015-05-18 DIAGNOSIS — T829XXA Unspecified complication of cardiac and vascular prosthetic device, implant and graft, initial encounter: Secondary | ICD-10-CM | POA: Diagnosis not present

## 2015-05-18 DIAGNOSIS — Z8673 Personal history of transient ischemic attack (TIA), and cerebral infarction without residual deficits: Secondary | ICD-10-CM | POA: Diagnosis not present

## 2015-05-18 DIAGNOSIS — Y841 Kidney dialysis as the cause of abnormal reaction of the patient, or of later complication, without mention of misadventure at the time of the procedure: Secondary | ICD-10-CM | POA: Insufficient documentation

## 2015-05-18 DIAGNOSIS — I4891 Unspecified atrial fibrillation: Secondary | ICD-10-CM | POA: Insufficient documentation

## 2015-05-18 DIAGNOSIS — N186 End stage renal disease: Secondary | ICD-10-CM | POA: Diagnosis not present

## 2015-05-18 DIAGNOSIS — I509 Heart failure, unspecified: Secondary | ICD-10-CM | POA: Diagnosis not present

## 2015-05-18 DIAGNOSIS — I739 Peripheral vascular disease, unspecified: Secondary | ICD-10-CM | POA: Insufficient documentation

## 2015-05-18 DIAGNOSIS — D509 Iron deficiency anemia, unspecified: Secondary | ICD-10-CM | POA: Insufficient documentation

## 2015-05-18 HISTORY — PX: PERIPHERAL VASCULAR CATHETERIZATION: SHX172C

## 2015-05-18 LAB — PROTIME-INR
INR: 1.17
PROTHROMBIN TIME: 15.1 s — AB (ref 11.4–15.0)

## 2015-05-18 LAB — POTASSIUM: POTASSIUM: 4.3 mmol/L (ref 3.5–5.1)

## 2015-05-18 SURGERY — DIALYSIS/PERMA CATHETER INSERTION
Anesthesia: Moderate Sedation

## 2015-05-18 MED ORDER — DEXTROSE 5 % IV SOLN
INTRAVENOUS | Status: AC
  Administered 2015-05-18: 10:00:00
  Filled 2015-05-18: qty 1.5

## 2015-05-18 MED ORDER — FENTANYL CITRATE (PF) 100 MCG/2ML IJ SOLN
INTRAMUSCULAR | Status: DC | PRN
  Administered 2015-05-18 (×2): 50 ug via INTRAVENOUS

## 2015-05-18 MED ORDER — MIDAZOLAM HCL 2 MG/2ML IJ SOLN
INTRAMUSCULAR | Status: DC | PRN
  Administered 2015-05-18: 2 mg via INTRAVENOUS

## 2015-05-18 MED ORDER — SODIUM CHLORIDE 0.9 % IV SOLN: INTRAVENOUS | Status: DC

## 2015-05-18 MED ORDER — FENTANYL CITRATE (PF) 100 MCG/2ML IJ SOLN
INTRAMUSCULAR | Status: AC
  Filled 2015-05-18: qty 2

## 2015-05-18 MED ORDER — HEPARIN SODIUM (PORCINE) 10000 UNIT/ML IJ SOLN
INTRAMUSCULAR | Status: AC
Start: 2015-05-18 — End: 2015-05-18
  Filled 2015-05-18: qty 1

## 2015-05-18 MED ORDER — LIDOCAINE-EPINEPHRINE (PF) 1 %-1:200000 IJ SOLN
INTRAMUSCULAR | Status: DC | PRN
  Administered 2015-05-18: 10 mL via INTRADERMAL

## 2015-05-18 MED ORDER — HEPARIN (PORCINE) IN NACL 2-0.9 UNIT/ML-% IJ SOLN
INTRAMUSCULAR | Status: AC
Start: 2015-05-18 — End: 2015-05-18
  Filled 2015-05-18: qty 1000

## 2015-05-18 MED ORDER — CEFUROXIME SODIUM 1.5 G IJ SOLR: 1.5000 g | INTRAMUSCULAR | Status: DC

## 2015-05-18 MED ORDER — MIDAZOLAM HCL 5 MG/5ML IJ SOLN
INTRAMUSCULAR | Status: AC
Start: 2015-05-18 — End: 2015-05-18
  Filled 2015-05-18: qty 5

## 2015-05-18 MED ORDER — DEXTROSE 5 % IV SOLN
INTRAVENOUS | Status: AC
  Filled 2015-05-18: qty 1.5

## 2015-05-18 MED ORDER — LIDOCAINE-EPINEPHRINE (PF) 1 %-1:200000 IJ SOLN
INTRAMUSCULAR | Status: AC
  Filled 2015-05-18: qty 30

## 2015-05-18 SURGICAL SUPPLY — 4 items
CATH PALINDROME-P 23CM W/VT (CATHETERS) ×3 IMPLANT
PACK ANGIOGRAPHY (CUSTOM PROCEDURE TRAY) ×3 IMPLANT
TOWEL OR 17X26 4PK STRL BLUE (TOWEL DISPOSABLE) ×3 IMPLANT
WIRE AMPLATZ SS .035X180 (WIRE) ×3 IMPLANT

## 2015-05-18 NOTE — Discharge Instructions (Signed)

## 2015-05-18 NOTE — H&P (Signed)
[facility] Admission History & Physical  MRN : [id number]  Sean BoutonHolt Minahan Jr. is a 81 y.o. (1934-04-05) male who presents with chief complaint of No chief complaint on file. Marland Kitchen.  History of Present Illness: Patient is sent over from his dialysis access center for dysfunction of his dialysis access. His fistula is not ready for use in his catheter is running poorly and they have requested exchange. He has no other complaints today.  Current Facility-Administered Medications  Medication Dose Route Frequency Provider Last Rate Last Dose  . 0.9 %  sodium chloride infusion   Intravenous Continuous Kimberly A Stegmayer, PA-C      . cefUROXime (ZINACEF) 1.5 g in dextrose 5 % 50 mL IVPB  1.5 g Intravenous 30 min Pre-Op Kimberly A Stegmayer, PA-C      . dextrose 5 % with cefUROXime (ZINACEF) ADS Med             Past Medical History  Diagnosis Date  . Chronic renal failure, stage 4 (severe) (HCC)   . Hypertension   . CHF (congestive heart failure) (HCC)   . Gout   . Glaucoma   . Neuropathy (HCC)   . Stroke (HCC)   . Peripheral vascular disease (HCC)   . Coronary artery disease   . A-fib (HCC)   . Dysrhythmia   . Blindness of right eye   . Anemia     iron deficiency    Past Surgical History  Procedure Laterality Date  . Vascular surgery Bilateral     Carotid Endarterectomy  . Eye surgery Left     Cataract Extraction  . Av fistula placement Left 04/06/2015    Procedure: ARTERIOVENOUS (AV) FISTULA CREATION;  Surgeon: Annice NeedyJason S Vicie Cech, MD;  Location: [facility] ORS;  Service: Vascular;  Laterality: Left;  . Peripheral vascular catheterization N/A 04/12/2015    Procedure: Dialysis/Perma Catheter Insertion;  Surgeon: Annice NeedyJason S Ricky Doan, MD;  Location: [facility] INVASIVE CV LAB;  Service: Cardiovascular;  Laterality: N/A;    Social History Social History  Substance Use Topics  . Smoking status: Former Smoker -- 0.25 packs/day for 15 years    Types: Cigarettes    Quit date:  09/06/2006  . Smokeless tobacco: Never Used  . Alcohol Use: No    Family History Family History  Problem Relation Age of Onset  . Diabetes Mother   No bleeding disorders, clotting disorders, or autoimmune diseases  No Known Allergies   REVIEW OF SYSTEMS (Negative unless checked)  Constitutional: [] Weight loss  [] Fever  [] Chills Cardiac: [] Chest pain   [] Chest pressure   [] Palpitations   [] Shortness of breath when laying flat   [] Shortness of breath at rest   [] Shortness of breath with exertion. Vascular:  [] Pain in legs with walking   [] Pain in legs at rest   [] Pain in legs when laying flat   [] Claudication   [] Pain in feet when walking  [] Pain in feet at rest  [] Pain in feet when laying flat   [] History of DVT   [] Phlebitis   [] Swelling in legs   [] Varicose veins   [] Non-healing ulcers Pulmonary:   [] Uses home oxygen   [] Productive cough   [] Hemoptysis   [] Wheeze  [] COPD   [] Asthma Neurologic:  [] Dizziness  [] Blackouts   [] Seizures   [x] History of stroke   [] History of TIA  [] Aphasia   [] Temporary blindness   [] Dysphagia   [] Weakness or numbness in arms   [] Weakness or numbness in legs Musculoskeletal:  [] Arthritis   []   Joint swelling   Joint pain   Low back pain Hematologic:  Easy bruising  Easy bleeding   Hypercoagulable state   Anemic  Hepatitis Gastrointestinal:  Blood in stool   Vomiting blood  Gastroesophageal reflux/heartburn   Difficulty swallowing. Genitourinary:  Chronic kidney disease   Difficult urination  Frequent urination  Burning with urination   Blood in urine Skin:  Rashes   Ulcers   Wounds Psychological:  History of anxiety    History of major depression.  Physical Examination  Filed Vitals:   05/18/15 0915  BP: 159/98  Pulse: 99  Temp: 98.4 F (36.9 C)  TempSrc: Axillary  Resp: 16  Height:  (1.88 m)  Weight: 117.482 kg (259 lb)  SpO2: 95%   Body mass index is 33.24 kg/(m^2). Gen: WD/WN, NAD Head: [place]/AT, No  temporalis wasting. Prominent temp pulse not noted. Ear/Nose/Throat: Hearing grossly intact, nares w/o erythema or drainage, oropharynx w/o Erythema/Exudate,  Eyes: PERRLA, EOMI.  Neck: Supple, no nuchal rigidity.  No JVD.  Pulmonary:  Good air movement no use of accessory muscles.  Cardiac: RRR, normal S1, S2,  Vascular:  Vessel Right Left  Radial Palpable Palpable                                   Gastrointestinal: soft, non-tender/non-distended. No guarding/reflex.  Musculoskeletal: M/S 5/5 throughout.  Extremities without ischemic changes.  No deformity or atrophy. LE edema present Neurologic: CN 2-12 intact. Pain and light touch intact in extremities.  Symmetrical.  Speech is fluent. Motor exam as listed above. Psychiatric: Judgment intact, Mood & affect appropriate for pt's clinical situation. Dermatologic: No rashes or ulcers noted.  No cellulitis or open wounds. Lymph : No Cervical, Axillary, or Inguinal lymphadenopathy.     CBC Lab Results  Component Value Date   WBC 10.4 05/16/2015   HGB 9.9* 05/16/2015   HCT 31.3* 05/16/2015   MCV 84.8 05/16/2015   PLT 248 05/16/2015    BMET    Component Value Date/Time   NA 136 05/16/2015 1412   K 4.0 05/16/2015 1412   CL 100* 05/16/2015 1412   CO2 28 05/16/2015 1412   GLUCOSE 131* 05/16/2015 1412   BUN 73* 05/16/2015 1412   CREATININE 3.85* 05/16/2015 1412   CALCIUM 8.1* 05/16/2015 1412   GFRNONAA 14* 05/16/2015 1412   GFRAA 16* 05/16/2015 1412   Estimated Creatinine Clearance: 21.2 mL/min (by C-G formula based on Cr of 3.85).  COAG Lab Results  Component Value Date   INR 1.11 04/17/2015   INR 1.01 04/16/2015   INR 1.05 04/15/2015    Radiology Dg Chest 1 View  05/16/2015  CLINICAL DATA:  Fever. EXAM: CHEST 1 VIEW COMPARISON:  04/17/2015 FINDINGS: Tip of the right dialysis catheter at the atrial caval junction. The heart is enlarged, unchanged. No pulmonary edema. Limited left basilar assessment due to body  habitus and portable technique. The lungs are otherwise clear. No pneumothorax. No acute osseous abnormalities are seen. IMPRESSION: Limited left basilar assessment.  Lungs otherwise clear. Electronically Signed   By: Rubye Oaks M.D.   On: 05/16/2015 21:20     Assessment/Plan 1. ESRD with poorly functional dialysis access.  Fistula not ready for use.  Catheter not working well. Will exchange catheter today 2. Dysfunction of dialysis access.  See above 3. Carotid artery stenosis. S/p bilateral stenting 4. Htn. Continue outpatient meds   Luccia Reinheimer, MD  05/18/2015 9:38  AM     

## 2015-05-18 NOTE — Op Note (Signed)
OPERATIVE NOTE    PRE-OPERATIVE DIAGNOSIS: 1. ESRD 2. Non-functional permcath  POST-OPERATIVE DIAGNOSIS: same as above  PROCEDURE: 1. Fluoroscopic guidance for placement of catheter 2. Placement of a 23 cm tip to cuff tunneled hemodialysis catheter via the right internal jugular vein and removal or previous catheter  SURGEON: DEW,JASON, MD  ANESTHESIA:  Local/MCS  ESTIMATED BLOOD LOSS: minimal  FINDING(S): none  SPECIMEN(S):  None  INDICATIONS:   Patient is a 81 y.o.male who presents with non-functional dialysis catheter and ESRD.  The patient needs long term dialysis access for their ESRD, and a Permcath is necessary.  Risks and benefits are discussed and informed consent is obtained.    DESCRIPTION: After obtaining full informed written consent, the patient was brought back to the vascular suited. The patient's existing catheter, neck and chest were sterilely prepped and draped in a sterile surgical field was created.  The existing catheter was dissected free from the fibrous sheath securing the cuff with hemostats and blunt dissection.  A wire was placed. The existing catheter was then removed and the wire used to keep venous access. I selected a 23 cm tip to cuff tunneled dialysis catheter.  Using fluoroscopic guidance the catheter tips were parked in the right atrium. The appropriate distal connectors were placed. It withdrew blood well and flushed easily with heparinized saline and a concentrated heparin solution was then placed. It was secured to the chest wall with 2 Prolene sutures. A 4-0 Monocryl pursestring suture was placed around the exit site. Sterile dressings were placed. The patient tolerated the procedure well and was taken to the recovery room in stable condition.  COMPLICATIONS: None  CONDITION: Stable  DEW,JASON 05/18/2015 10:27 AM

## 2015-05-19 ENCOUNTER — Encounter: Payer: Self-pay | Admitting: Vascular Surgery

## 2015-05-25 ENCOUNTER — Other Ambulatory Visit: Payer: Self-pay | Admitting: Vascular Surgery

## 2015-05-29 ENCOUNTER — Encounter
Admission: RE | Disposition: A | Discharge status: Skilled Nursing Facility | Payer: Self-pay | Source: Ambulatory Visit | Attending: Vascular Surgery

## 2015-05-29 ENCOUNTER — Ambulatory Visit
Admission: RE | Admit: 2015-05-29 | Discharge: 2015-05-29 | Disposition: A | Discharge status: Skilled Nursing Facility | Payer: Medicare (Managed Care) | Source: Ambulatory Visit | Attending: Vascular Surgery | Admitting: Vascular Surgery

## 2015-05-29 ENCOUNTER — Ambulatory Visit: Payer: Medicare (Managed Care)

## 2015-05-29 DIAGNOSIS — L03221 Cellulitis of neck: Secondary | ICD-10-CM | POA: Insufficient documentation

## 2015-05-29 DIAGNOSIS — Z79899 Other long term (current) drug therapy: Secondary | ICD-10-CM | POA: Insufficient documentation

## 2015-05-29 DIAGNOSIS — J9811 Atelectasis: Secondary | ICD-10-CM | POA: Insufficient documentation

## 2015-05-29 DIAGNOSIS — N186 End stage renal disease: Secondary | ICD-10-CM

## 2015-05-29 DIAGNOSIS — I6529 Occlusion and stenosis of unspecified carotid artery: Secondary | ICD-10-CM

## 2015-05-29 DIAGNOSIS — Y832 Surgical operation with anastomosis, bypass or graft as the cause of abnormal reaction of the patient, or of later complication, without mention of misadventure at the time of the procedure: Secondary | ICD-10-CM | POA: Insufficient documentation

## 2015-05-29 DIAGNOSIS — Z7902 Long term (current) use of antithrombotics/antiplatelets: Secondary | ICD-10-CM | POA: Insufficient documentation

## 2015-05-29 DIAGNOSIS — E7800 Pure hypercholesterolemia, unspecified: Secondary | ICD-10-CM

## 2015-05-29 DIAGNOSIS — J90 Pleural effusion, not elsewhere classified: Secondary | ICD-10-CM

## 2015-05-29 DIAGNOSIS — A419 Sepsis, unspecified organism: Secondary | ICD-10-CM | POA: Diagnosis not present

## 2015-05-29 DIAGNOSIS — R0902 Hypoxemia: Secondary | ICD-10-CM | POA: Diagnosis not present

## 2015-05-29 DIAGNOSIS — Z992 Dependence on renal dialysis: Secondary | ICD-10-CM

## 2015-05-29 DIAGNOSIS — R0602 Shortness of breath: Secondary | ICD-10-CM

## 2015-05-29 DIAGNOSIS — T82858A Stenosis of vascular prosthetic devices, implants and grafts, initial encounter: Secondary | ICD-10-CM | POA: Insufficient documentation

## 2015-05-29 DIAGNOSIS — Z87891 Personal history of nicotine dependence: Secondary | ICD-10-CM | POA: Insufficient documentation

## 2015-05-29 DIAGNOSIS — I120 Hypertensive chronic kidney disease with stage 5 chronic kidney disease or end stage renal disease: Secondary | ICD-10-CM

## 2015-05-29 HISTORY — PX: PERIPHERAL VASCULAR CATHETERIZATION: SHX172C

## 2015-05-29 LAB — PROTIME-INR
INR: 1.2
Prothrombin Time: 15.4 seconds — ABNORMAL HIGH (ref 11.4–15.0)

## 2015-05-29 LAB — BASIC METABOLIC PANEL
ANION GAP: 11 (ref 5–15)
BUN: 42 mg/dL — AB (ref 6–20)
CHLORIDE: 100 mmol/L — AB (ref 101–111)
CO2: 31 mmol/L (ref 22–32)
Calcium: 8.7 mg/dL — ABNORMAL LOW (ref 8.9–10.3)
Creatinine, Ser: 3.62 mg/dL — ABNORMAL HIGH (ref 0.61–1.24)
GFR calc Af Amer: 17 mL/min — ABNORMAL LOW (ref >60)
GFR, EST NON AFRICAN AMERICAN: 15 mL/min — AB (ref >60)
GLUCOSE: 166 mg/dL — AB (ref 65–99)
POTASSIUM: 3.6 mmol/L (ref 3.5–5.1)
Sodium: 142 mmol/L (ref 135–145)

## 2015-05-29 LAB — CBC
HEMATOCRIT: 32 % — AB (ref 40.0–52.0)
HEMOGLOBIN: 9.9 g/dL — AB (ref 13.0–18.0)
MCH: 25.7 pg — ABNORMAL LOW (ref 26.0–34.0)
MCHC: 31 g/dL — AB (ref 32.0–36.0)
MCV: 82.8 fL (ref 80.0–100.0)
Platelets: 335 10*3/uL (ref 150–440)
RBC: 3.86 MIL/uL — ABNORMAL LOW (ref 4.40–5.90)
RDW: 20 % — ABNORMAL HIGH (ref 11.5–14.5)
WBC: 10.2 10*3/uL (ref 3.8–10.6)

## 2015-05-29 LAB — POTASSIUM: Potassium: 3.7 mmol/L (ref 3.5–5.1)

## 2015-05-29 SURGERY — A/V SHUNTOGRAM/FISTULAGRAM
Anesthesia: Moderate Sedation

## 2015-05-29 MED ORDER — FENTANYL CITRATE (PF) 100 MCG/2ML IJ SOLN
INTRAMUSCULAR | Status: DC | PRN
  Administered 2015-05-29 (×3): 25 ug via INTRAVENOUS

## 2015-05-29 MED ORDER — PHENOL 1.4 % MT LIQD: 1.0000 | OROMUCOSAL | Status: DC | PRN

## 2015-05-29 MED ORDER — MIDAZOLAM HCL 5 MG/5ML IJ SOLN
INTRAMUSCULAR | Status: AC
  Filled 2015-05-29: qty 5

## 2015-05-29 MED ORDER — DEXTROSE 5 % IV SOLN
1.5000 g | INTRAVENOUS | Status: DC
  Filled 2015-05-29: qty 1.5

## 2015-05-29 MED ORDER — HEPARIN SODIUM (PORCINE) 1000 UNIT/ML IJ SOLN
INTRAMUSCULAR | Status: AC
  Filled 2015-05-29: qty 1

## 2015-05-29 MED ORDER — METOPROLOL TARTRATE 1 MG/ML IV SOLN: 2.0000 mg | INTRAVENOUS | Status: DC | PRN

## 2015-05-29 MED ORDER — ACETAMINOPHEN 325 MG RE SUPP: 325.0000 mg | RECTAL | Status: DC | PRN

## 2015-05-29 MED ORDER — FENTANYL CITRATE (PF) 100 MCG/2ML IJ SOLN
INTRAMUSCULAR | Status: AC
  Filled 2015-05-29: qty 2

## 2015-05-29 MED ORDER — MIDAZOLAM HCL 2 MG/2ML IJ SOLN
INTRAMUSCULAR | Status: DC | PRN
  Administered 2015-05-29: 1 mg via INTRAVENOUS

## 2015-05-29 MED ORDER — LABETALOL HCL 5 MG/ML IV SOLN: 10.0000 mg | INTRAVENOUS | Status: DC | PRN

## 2015-05-29 MED ORDER — HEPARIN SODIUM (PORCINE) 1000 UNIT/ML IJ SOLN
INTRAMUSCULAR | Status: DC | PRN
  Administered 2015-05-29: 4000 [IU] via INTRAVENOUS

## 2015-05-29 MED ORDER — ACETAMINOPHEN 325 MG PO TABS: 325.0000 mg | ORAL_TABLET | ORAL | Status: DC | PRN

## 2015-05-29 MED ORDER — SODIUM CHLORIDE 0.9 % IV SOLN
INTRAVENOUS | Status: DC
  Administered 2015-05-29: 12:00:00 via INTRAVENOUS

## 2015-05-29 MED ORDER — GUAIFENESIN-DM 100-10 MG/5ML PO SYRP: 15.0000 mL | ORAL_SOLUTION | ORAL | Status: DC | PRN

## 2015-05-29 MED ORDER — CEFAZOLIN SODIUM 1-5 GM-% IV SOLN
INTRAVENOUS | Status: AC
  Administered 2015-05-29: 15:00:00
  Filled 2015-05-29: qty 50

## 2015-05-29 MED ORDER — HYDRALAZINE HCL 20 MG/ML IJ SOLN: 5.0000 mg | INTRAMUSCULAR | Status: DC | PRN

## 2015-05-29 MED ORDER — ALTEPLASE 2 MG IJ SOLR
INTRAMUSCULAR | Status: AC
  Filled 2015-05-29: qty 4

## 2015-05-29 MED ORDER — MORPHINE SULFATE (PF) 4 MG/ML IV SOLN: 2.0000 mg | INTRAVENOUS | Status: DC | PRN

## 2015-05-29 MED ORDER — IOHEXOL 300 MG/ML  SOLN
INTRAMUSCULAR | Status: DC | PRN
  Administered 2015-05-29: 45 mL via INTRAVENOUS

## 2015-05-29 MED ORDER — STERILE WATER FOR INJECTION IJ SOLN
INTRAMUSCULAR | Status: AC
  Filled 2015-05-29: qty 30

## 2015-05-29 MED ORDER — ALUM & MAG HYDROXIDE-SIMETH 200-200-20 MG/5ML PO SUSP: 15.0000 mL | ORAL | Status: DC | PRN

## 2015-05-29 MED ORDER — ONDANSETRON HCL 4 MG/2ML IJ SOLN
4.0000 mg | Freq: Four times a day (QID) | INTRAMUSCULAR | Status: DC | PRN
Start: 2015-05-29 — End: 2015-05-29

## 2015-05-29 MED ORDER — SODIUM CHLORIDE 0.9 % IV SOLN: 500.0000 mL | Freq: Once | INTRAVENOUS | Status: DC | PRN

## 2015-05-29 MED ORDER — OXYCODONE-ACETAMINOPHEN 5-325 MG PO TABS: 1.0000 | ORAL_TABLET | ORAL | Status: DC | PRN

## 2015-05-29 SURGICAL SUPPLY — 19 items
BALLN ULTRVRSE 3X80X75 (BALLOONS) ×4
BALLN ULTRVRSE 4X80X75 (BALLOONS) ×4
BALLOON ULTRVRSE 3X80X75 (BALLOONS) ×2 IMPLANT
BALLOON ULTRVRSE 4X80X75 (BALLOONS) ×2 IMPLANT
CANNULA 5F STIFF (CANNULA) ×4 IMPLANT
CATH CXI SUPP ST 4FR 90CM (MICROCATHETER) ×4 IMPLANT
CATH TORCON 5FR 0.38 (CATHETERS) ×4 IMPLANT
COIL MREYE 5X5 (Vascular Products) ×4 IMPLANT
COIL NESTER 7X5 (Vascular Products) ×16 IMPLANT
DEVICE PRESTO INFLATION (MISCELLANEOUS) ×4 IMPLANT
DEVICE TORQUE (MISCELLANEOUS) ×4 IMPLANT
DRAPE BRACHIAL (DRAPES) ×4 IMPLANT
GLIDEWIRE STIFF .35X180X3 HYDR (WIRE) ×4 IMPLANT
PACK ANGIOGRAPHY (CUSTOM PROCEDURE TRAY) ×4 IMPLANT
SET AVX THROMB ULT (MISCELLANEOUS) IMPLANT
SHEATH BRITE TIP 6FRX5.5 (SHEATH) ×8 IMPLANT
TOWEL OR 17X26 4PK STRL BLUE (TOWEL DISPOSABLE) ×4 IMPLANT
WIRE J 3MM .035X145CM (WIRE) ×4 IMPLANT
WIRE MAGIC TOR.035 180C (WIRE) ×8 IMPLANT

## 2015-05-29 NOTE — Discharge Instructions (Signed)

## 2015-05-29 NOTE — Op Note (Signed)
[facility]    OPERATIVE NOTE   PROCEDURE: 1.   Left radiocephalic arteriovenous fistula cannulation under ultrasound guidance in the distal radial artery 2.   Left upper extremity fistulogram and left upper extremity angiogram from distal left radial approach including catheter placement into the proximal radial artery and catheter placement into the forearm cephalic vein. 3.   Percutaneous transluminal angioplasty of fistula anastomosis with balloon going from the distal radial artery across the anastomosis into the cephalic vein with 4 mm diameter angioplasty balloon. 4.   Percutaneous transluminal angioplasty of cephalic vein in the forearm with 3 mm diameter angioplasty balloon 5.   Coil embolization of the cephalic vein to occlude the fistula for extravasation after attempted recanalization using five 5 mm coils 6.   Percutaneous transluminal angioplasty of the radial artery and the distal forearm across the anastomosis to treat stenosis just proximal to the anastomosis as well as to occlude flow to the fistula  PRE-OPERATIVE DIAGNOSIS: 1. ESRD  POST-OPERATIVE DIAGNOSIS: same as above with occluded left arm cephalic vein portion of radiocephalic AV fistula unable to be recanalized  SURGEON: Leotis Pain, MD  ANESTHESIA: local with MCS  ESTIMATED BLOOD LOSS: 50 cc  FINDING(S): Occluded left arm cephalic vein portion of radiocephalic AV fistula unable to be recanalized  SPECIMEN(S):  None  CONTRAST: 45 cc  INDICATIONS: Sean Delgado. is a 81 y.o. male who presents with occluded left radiocephalic arteriovenous fistula.  The patient is scheduled for attempted declot to try to salvage the fistula.  The patient is aware the risks include but are not limited to: bleeding, infection, thrombosis of the cannulated access, and possible anaphylactic reaction to the contrast.  The patient is aware of the risks of the procedure and elects to proceed  forward.  DESCRIPTION: After full informed written consent was obtained, the patient was brought back to the angiography suite and placed supine upon the angiography table.  The patient was connected to monitoring equipment.  The left arm was prepped and draped in the standard fashion for a percutaneous access intervention.  Under ultrasound guidance, the radial artery distal to the fistula anastomosis was accessed with a micropuncture needle under direct ultrasound guidance and a permanent image was performed.  The microwire was advanced into the radial artery proximal to the fistula and the needle was exchanged for the a microsheath that was placed in the radial artery proximal to the fistula to evaluate the fistula and the arterial flow.  Imaging was performed. This demonstrated a short stump of the fistula with flow but occlusion less than a centimeter beyond the origin of the cephalic vein. In addition, there was at least a moderate stenosis in the radial artery just proximal to the fistula anastomosis. This was small but appeared to be in the 65-70% range.  Based on the images, this patient will need extensive treatment to salvage the fistula. I then gave the patient 4000 units of intravenous heparin.  I then placed a 6 French sheath and with a Glidewire was able to navigate into the cephalic vein to get into the fistula. The vein was occluded and atretic throughout its course in the forearm. With a Glidewire and a Kumpe catheter and was able to get into the proximal to mid cephalic vein in the forearm. I treated the anastomotic region with a balloon that went from the radial artery distal to the anastomosis across the anastomosis into the first few centimeters of the cephalic vein with  a 4 mm diameter angioplasty balloon inflated to 8 atm for 1 minute. I then put a Kumpe catheter into the cephalic vein and try to navigate through the occluded forearm cephalic vein but 3 areas of extravasation were  created in the course of trying to cross this atretic, occluded cephalic vein and I was never to able to gain intraluminal flow in the cephalic vein at the elbow or above. With the extravasation, I replaced a Kumpe catheter and put a 3 mm diameter by 8 cm length angioplasty balloon in the cephalic vein to try to tampanod the areas of extravasation in the mid and distal forearm. This was inflated to 12 atm for 2 minutes. There was still significant residual extravasation and this was clinically apparent on his arm with hematomas. I then elected to coil embolize the cephalic vein beyond the fistula to stop the hemorrhage. A total of 5 coils were used with the first coil being a 5 mm x 5 cm coil in the next 4 coils being a 5 mm x 7 cm coil. This resulted in significantly reduced extravasation with only slight flow seen to the fistula. I then redirected the Magic torque wire across the anastomosis into the radial artery to the antecubital fossa. To treat the stenosis of the radial artery just proximal to the anastomosis and maintain perfusion to the hand as well as to [facility] not blood flow to the fistula/cephalic vein, I elected to treat this area with a 3 mm diameter by 8 cm length angioplasty balloon. This was inflated to 8 atm and the radial artery across this lesion for proximally 2 minutes. Imaging then performed through the sheath in the radial artery distal to the anastomosis showed markedly improved flow in the radial artery with no significant residual stenosis identified and no further flow into the fistula beyond the first cm or two and no extravasation.  The wire and balloon were removed from the sheath.  A 4-0 Monocryl purse-string suture was sewn around the sheath.  The sheath was removed while tying down the suture.  A sterile bandage was applied to the puncture site.  COMPLICATIONS: None  CONDITION: Stable   DEW,JASON  05/29/2015 3:38 PM

## 2015-05-29 NOTE — H&P (Signed)
  [facility] History & Physical Update  The patient was interviewed and re-examined.  The patient's previous History and Physical has been reviewed and he has some shortness of breath.  Did a CXR which showed some atelectasis and mild effusions, WBC count was 10K, no fever.  Will proceed as if we have a limited opportunity to salvage a fistula after it occludes.  It may already be unsalvageable, but if we delay further there will be no chance at salvage. We plan to proceed with the scheduled procedure.  Aristides Luckey, MD  05/29/2015, 1:37 PM

## 2015-05-29 NOTE — Progress Notes (Signed)
Pt comes in today from nursing home for procediure per Dr Dew,bp stable, however pt with very moist congested cough,, o2 sats < 85 thus placed oxygen on. Pt requiring 5l/min to get to 90%, cbc ordered, conferred with Dr Wyn Quakerew regarding pt;s overall status, cxr ordered,labs pending. Niece present,  Rapid rates of afib to aflutter. With freq. pvc's

## 2015-05-30 ENCOUNTER — Encounter: Payer: Self-pay | Admitting: Emergency Medicine

## 2015-05-30 ENCOUNTER — Emergency Department: Payer: Medicare (Managed Care)

## 2015-05-30 ENCOUNTER — Inpatient Hospital Stay
Admission: EM | Admit: 2015-05-30 | Discharge: 2015-06-05 | DRG: 853 | Disposition: A | Discharge status: Skilled Nursing Facility | Payer: Medicare (Managed Care) | Attending: Internal Medicine | Admitting: Internal Medicine

## 2015-05-30 DIAGNOSIS — Z833 Family history of diabetes mellitus: Secondary | ICD-10-CM

## 2015-05-30 DIAGNOSIS — Z992 Dependence on renal dialysis: Secondary | ICD-10-CM | POA: Diagnosis not present

## 2015-05-30 DIAGNOSIS — Z7901 Long term (current) use of anticoagulants: Secondary | ICD-10-CM

## 2015-05-30 DIAGNOSIS — Y95 Nosocomial condition: Secondary | ICD-10-CM | POA: Diagnosis present

## 2015-05-30 DIAGNOSIS — H5441 Blindness, right eye, normal vision left eye: Secondary | ICD-10-CM | POA: Diagnosis present

## 2015-05-30 DIAGNOSIS — R509 Fever, unspecified: Secondary | ICD-10-CM

## 2015-05-30 DIAGNOSIS — D631 Anemia in chronic kidney disease: Secondary | ICD-10-CM | POA: Diagnosis present

## 2015-05-30 DIAGNOSIS — Z79899 Other long term (current) drug therapy: Secondary | ICD-10-CM

## 2015-05-30 DIAGNOSIS — L8990 Pressure ulcer of unspecified site, unspecified stage: Secondary | ICD-10-CM | POA: Diagnosis present

## 2015-05-30 DIAGNOSIS — Z7982 Long term (current) use of aspirin: Secondary | ICD-10-CM

## 2015-05-30 DIAGNOSIS — R0902 Hypoxemia: Secondary | ICD-10-CM | POA: Diagnosis present

## 2015-05-30 DIAGNOSIS — J159 Unspecified bacterial pneumonia: Secondary | ICD-10-CM | POA: Diagnosis present

## 2015-05-30 DIAGNOSIS — Z66 Do not resuscitate: Secondary | ICD-10-CM | POA: Diagnosis present

## 2015-05-30 DIAGNOSIS — I2510 Atherosclerotic heart disease of native coronary artery without angina pectoris: Secondary | ICD-10-CM | POA: Diagnosis present

## 2015-05-30 DIAGNOSIS — Z9842 Cataract extraction status, left eye: Secondary | ICD-10-CM | POA: Diagnosis not present

## 2015-05-30 DIAGNOSIS — Z8673 Personal history of transient ischemic attack (TIA), and cerebral infarction without residual deficits: Secondary | ICD-10-CM

## 2015-05-30 DIAGNOSIS — I120 Hypertensive chronic kidney disease with stage 5 chronic kidney disease or end stage renal disease: Secondary | ICD-10-CM | POA: Diagnosis present

## 2015-05-30 DIAGNOSIS — R0602 Shortness of breath: Secondary | ICD-10-CM

## 2015-05-30 DIAGNOSIS — Z87891 Personal history of nicotine dependence: Secondary | ICD-10-CM | POA: Diagnosis not present

## 2015-05-30 DIAGNOSIS — T82510A Breakdown (mechanical) of surgically created arteriovenous fistula, initial encounter: Secondary | ICD-10-CM | POA: Diagnosis present

## 2015-05-30 DIAGNOSIS — I739 Peripheral vascular disease, unspecified: Secondary | ICD-10-CM | POA: Diagnosis present

## 2015-05-30 DIAGNOSIS — I5032 Chronic diastolic (congestive) heart failure: Secondary | ICD-10-CM | POA: Diagnosis present

## 2015-05-30 DIAGNOSIS — N186 End stage renal disease: Secondary | ICD-10-CM | POA: Diagnosis present

## 2015-05-30 DIAGNOSIS — Z9889 Other specified postprocedural states: Secondary | ICD-10-CM

## 2015-05-30 DIAGNOSIS — N2581 Secondary hyperparathyroidism of renal origin: Secondary | ICD-10-CM | POA: Diagnosis present

## 2015-05-30 DIAGNOSIS — H409 Unspecified glaucoma: Secondary | ICD-10-CM | POA: Diagnosis present

## 2015-05-30 DIAGNOSIS — M109 Gout, unspecified: Secondary | ICD-10-CM | POA: Diagnosis present

## 2015-05-30 DIAGNOSIS — Y712 Prosthetic and other implants, materials and accessory cardiovascular devices associated with adverse incidents: Secondary | ICD-10-CM | POA: Diagnosis present

## 2015-05-30 DIAGNOSIS — I4891 Unspecified atrial fibrillation: Secondary | ICD-10-CM | POA: Diagnosis present

## 2015-05-30 DIAGNOSIS — A419 Sepsis, unspecified organism: Secondary | ICD-10-CM | POA: Diagnosis present

## 2015-05-30 DIAGNOSIS — J690 Pneumonitis due to inhalation of food and vomit: Secondary | ICD-10-CM

## 2015-05-30 LAB — CBC WITH DIFFERENTIAL/PLATELET
Basophils Absolute: 0.1 10*3/uL (ref 0–0.1)
Basophils Relative: 0 %
EOS ABS: 0 10*3/uL (ref 0–0.7)
Eosinophils Relative: 0 %
HCT: 32.4 % — ABNORMAL LOW (ref 40.0–52.0)
HEMOGLOBIN: 9.9 g/dL — AB (ref 13.0–18.0)
LYMPHS ABS: 0.9 10*3/uL — AB (ref 1.0–3.6)
LYMPHS PCT: 6 %
MCH: 25 pg — AB (ref 26.0–34.0)
MCHC: 30.6 g/dL — ABNORMAL LOW (ref 32.0–36.0)
MCV: 81.7 fL (ref 80.0–100.0)
Monocytes Absolute: 0.6 10*3/uL (ref 0.2–1.0)
Monocytes Relative: 4 %
NEUTROS PCT: 90 %
Neutro Abs: 14.6 10*3/uL — ABNORMAL HIGH (ref 1.4–6.5)
Platelets: 319 10*3/uL (ref 150–440)
RBC: 3.96 MIL/uL — AB (ref 4.40–5.90)
RDW: 20.8 % — ABNORMAL HIGH (ref 11.5–14.5)
WBC: 16.3 10*3/uL — AB (ref 3.8–10.6)

## 2015-05-30 LAB — PROTIME-INR
INR: 1.27
PROTHROMBIN TIME: 16 s — AB (ref 11.4–15.0)

## 2015-05-30 LAB — LACTIC ACID, PLASMA
Lactic Acid, Venous: 1.4 mmol/L (ref 0.5–2.0)
Lactic Acid, Venous: 1.5 mmol/L (ref 0.5–2.0)

## 2015-05-30 LAB — COMPREHENSIVE METABOLIC PANEL
ALBUMIN: 2.2 g/dL — AB (ref 3.5–5.0)
ALK PHOS: 99 U/L (ref 38–126)
ALT: 18 U/L (ref 17–63)
ANION GAP: 12 (ref 5–15)
AST: 27 U/L (ref 15–41)
BUN: 51 mg/dL — ABNORMAL HIGH (ref 6–20)
CO2: 30 mmol/L (ref 22–32)
CREATININE: 4.48 mg/dL — AB (ref 0.61–1.24)
Calcium: 8.6 mg/dL — ABNORMAL LOW (ref 8.9–10.3)
Chloride: 102 mmol/L (ref 101–111)
GFR calc Af Amer: 13 mL/min — ABNORMAL LOW (ref >60)
GFR calc non Af Amer: 11 mL/min — ABNORMAL LOW (ref >60)
Glucose, Bld: 163 mg/dL — ABNORMAL HIGH (ref 65–99)
Potassium: 3.9 mmol/L (ref 3.5–5.1)
Sodium: 144 mmol/L (ref 135–145)
Total Bilirubin: 1.1 mg/dL (ref 0.3–1.2)
Total Protein: 8.2 g/dL — ABNORMAL HIGH (ref 6.5–8.1)

## 2015-05-30 LAB — APTT: aPTT: 29 seconds (ref 24–36)

## 2015-05-30 LAB — BLOOD GAS, VENOUS
ACID-BASE EXCESS: 6.1 mmol/L — AB (ref 0.0–3.0)
BICARBONATE: 31.8 meq/L — AB (ref 21.0–28.0)
FIO2: 0.21
PCO2 VEN: 49 mmHg (ref 44.0–60.0)
PH VEN: 7.42 (ref 7.320–7.430)
Patient temperature: 37

## 2015-05-30 LAB — MRSA PCR SCREENING: MRSA by PCR: POSITIVE — AB

## 2015-05-30 LAB — KETONES, URINE: Ketones, ur: NEGATIVE mg/dL

## 2015-05-30 LAB — LIPASE, BLOOD: Lipase: 20 U/L (ref 11–51)

## 2015-05-30 LAB — BRAIN NATRIURETIC PEPTIDE: B Natriuretic Peptide: 724 pg/mL — ABNORMAL HIGH (ref 0.0–100.0)

## 2015-05-30 LAB — TROPONIN I: TROPONIN I: 0.05 ng/mL — AB (ref <0.031)

## 2015-05-30 MED ORDER — MUPIROCIN 2 % EX OINT
1.0000 "application " | TOPICAL_OINTMENT | Freq: Two times a day (BID) | CUTANEOUS | Status: AC
  Administered 2015-05-30 – 2015-06-04 (×10): 1 via NASAL
  Filled 2015-05-30: qty 22

## 2015-05-30 MED ORDER — PREGABALIN 75 MG PO CAPS
75.0000 mg | ORAL_CAPSULE | Freq: Every day | ORAL | Status: DC
  Administered 2015-05-30 – 2015-06-05 (×7): 75 mg via ORAL
  Filled 2015-05-30 (×7): qty 1

## 2015-05-30 MED ORDER — ALBUTEROL SULFATE (2.5 MG/3ML) 0.083% IN NEBU: 2.5000 mg | INHALATION_SOLUTION | RESPIRATORY_TRACT | Status: DC | PRN

## 2015-05-30 MED ORDER — FERROUS SULFATE 325 (65 FE) MG PO TBEC
325.0000 mg | DELAYED_RELEASE_TABLET | Freq: Every day | ORAL | Status: DC
  Administered 2015-05-30 – 2015-06-05 (×7): 325 mg via ORAL
  Filled 2015-05-30 (×9): qty 1

## 2015-05-30 MED ORDER — ISOSORBIDE MONONITRATE ER 60 MG PO TB24
60.0000 mg | ORAL_TABLET | Freq: Every day | ORAL | Status: DC
  Administered 2015-05-30 – 2015-06-05 (×7): 60 mg via ORAL
  Filled 2015-05-30 (×7): qty 1

## 2015-05-30 MED ORDER — PIPERACILLIN-TAZOBACTAM 3.375 G IVPB
3.3750 g | Freq: Once | INTRAVENOUS | Status: AC
  Administered 2015-05-30: 3.375 g via INTRAVENOUS

## 2015-05-30 MED ORDER — PIPERACILLIN-TAZOBACTAM 3.375 G IVPB
3.3750 g | Freq: Two times a day (BID) | INTRAVENOUS | Status: DC
  Administered 2015-05-30 – 2015-06-05 (×12): 3.375 g via INTRAVENOUS
  Filled 2015-05-30 (×15): qty 50

## 2015-05-30 MED ORDER — VANCOMYCIN HCL IN DEXTROSE 1-5 GM/200ML-% IV SOLN
1000.0000 mg | INTRAVENOUS | Status: DC
  Administered 2015-05-31 – 2015-06-03 (×3): 1000 mg via INTRAVENOUS
  Filled 2015-05-30 (×3): qty 200

## 2015-05-30 MED ORDER — ONDANSETRON HCL 4 MG/2ML IJ SOLN: 4.0000 mg | Freq: Four times a day (QID) | INTRAMUSCULAR | Status: DC | PRN

## 2015-05-30 MED ORDER — ATORVASTATIN CALCIUM 20 MG PO TABS
20.0000 mg | ORAL_TABLET | Freq: Every day | ORAL | Status: DC
  Administered 2015-05-30 – 2015-06-05 (×7): 20 mg via ORAL
  Filled 2015-05-30 (×7): qty 1

## 2015-05-30 MED ORDER — ONDANSETRON HCL 4 MG PO TABS: 4.0000 mg | ORAL_TABLET | Freq: Four times a day (QID) | ORAL | Status: DC | PRN

## 2015-05-30 MED ORDER — FUROSEMIDE 40 MG PO TABS
40.0000 mg | ORAL_TABLET | Freq: Two times a day (BID) | ORAL | Status: DC
  Administered 2015-05-30 – 2015-06-05 (×11): 40 mg via ORAL
  Filled 2015-05-30 (×12): qty 1

## 2015-05-30 MED ORDER — SODIUM CHLORIDE 0.9 % IV BOLUS (SEPSIS)
500.0000 mL | Freq: Once | INTRAVENOUS | Status: AC
  Administered 2015-05-30: 500 mL via INTRAVENOUS

## 2015-05-30 MED ORDER — PIPERACILLIN-TAZOBACTAM 3.375 G IVPB
INTRAVENOUS | Status: AC
  Administered 2015-05-30: 3.375 g via INTRAVENOUS
  Filled 2015-05-30: qty 50

## 2015-05-30 MED ORDER — METOPROLOL SUCCINATE ER 100 MG PO TB24
200.0000 mg | ORAL_TABLET | Freq: Every day | ORAL | Status: DC
Start: 2015-05-30 — End: 2015-06-05
  Administered 2015-05-30 – 2015-06-05 (×6): 200 mg via ORAL
  Filled 2015-05-30 (×6): qty 2

## 2015-05-30 MED ORDER — BRIMONIDINE TARTRATE 0.2 % OP SOLN
1.0000 [drp] | Freq: Two times a day (BID) | OPHTHALMIC | Status: DC
  Administered 2015-05-30 – 2015-06-05 (×13): 1 [drp] via OPHTHALMIC
  Filled 2015-05-30: qty 5

## 2015-05-30 MED ORDER — ACETAMINOPHEN 650 MG RE SUPP: 650.0000 mg | Freq: Four times a day (QID) | RECTAL | Status: DC | PRN

## 2015-05-30 MED ORDER — VANCOMYCIN HCL IN DEXTROSE 1-5 GM/200ML-% IV SOLN
1000.0000 mg | Freq: Once | INTRAVENOUS | Status: AC
  Administered 2015-05-30: 1000 mg via INTRAVENOUS

## 2015-05-30 MED ORDER — VANCOMYCIN HCL IN DEXTROSE 1-5 GM/200ML-% IV SOLN
1000.0000 mg | Freq: Once | INTRAVENOUS | Status: AC
  Administered 2015-05-30: 1000 mg via INTRAVENOUS
  Filled 2015-05-30: qty 200

## 2015-05-30 MED ORDER — CLOPIDOGREL BISULFATE 75 MG PO TABS
75.0000 mg | ORAL_TABLET | Freq: Every day | ORAL | Status: DC
  Administered 2015-05-30 – 2015-06-05 (×7): 75 mg via ORAL
  Filled 2015-05-30 (×7): qty 1

## 2015-05-30 MED ORDER — FUROSEMIDE 10 MG/ML IJ SOLN
40.0000 mg | Freq: Once | INTRAMUSCULAR | Status: AC
  Administered 2015-05-30: 40 mg via INTRAVENOUS
  Filled 2015-05-30: qty 4

## 2015-05-30 MED ORDER — ACETAMINOPHEN 325 MG PO TABS
650.0000 mg | ORAL_TABLET | Freq: Four times a day (QID) | ORAL | Status: DC | PRN
  Administered 2015-06-01: 650 mg via ORAL
  Filled 2015-05-30: qty 2

## 2015-05-30 MED ORDER — DILTIAZEM HCL ER COATED BEADS 180 MG PO CP24
180.0000 mg | ORAL_CAPSULE | Freq: Every day | ORAL | Status: DC
  Administered 2015-05-30 – 2015-06-02 (×4): 180 mg via ORAL
  Filled 2015-05-30 (×4): qty 1

## 2015-05-30 MED ORDER — SODIUM CHLORIDE 0.9 % IV SOLN: INTRAVENOUS | Status: DC

## 2015-05-30 MED ORDER — SENNOSIDES-DOCUSATE SODIUM 8.6-50 MG PO TABS
2.0000 | ORAL_TABLET | Freq: Every day | ORAL | Status: DC
  Administered 2015-05-30 – 2015-06-04 (×6): 2 via ORAL
  Filled 2015-05-30 (×6): qty 2

## 2015-05-30 MED ORDER — HYDROCODONE-ACETAMINOPHEN 5-325 MG PO TABS
1.0000 | ORAL_TABLET | Freq: Four times a day (QID) | ORAL | Status: DC | PRN
  Administered 2015-06-02 – 2015-06-04 (×5): 1 via ORAL
  Filled 2015-05-30 (×6): qty 1

## 2015-05-30 MED ORDER — SODIUM BICARBONATE 650 MG PO TABS
650.0000 mg | ORAL_TABLET | Freq: Two times a day (BID) | ORAL | Status: DC
  Administered 2015-05-30 – 2015-06-05 (×13): 650 mg via ORAL
  Filled 2015-05-30 (×13): qty 1

## 2015-05-30 MED ORDER — VANCOMYCIN HCL IN DEXTROSE 1-5 GM/200ML-% IV SOLN
INTRAVENOUS | Status: AC
  Administered 2015-05-30: 1000 mg via INTRAVENOUS
  Filled 2015-05-30: qty 200

## 2015-05-30 MED ORDER — HEPARIN SODIUM (PORCINE) 5000 UNIT/ML IJ SOLN
5000.0000 [IU] | Freq: Three times a day (TID) | INTRAMUSCULAR | Status: DC
  Administered 2015-05-30 – 2015-06-05 (×18): 5000 [IU] via SUBCUTANEOUS
  Filled 2015-05-30 (×18): qty 1

## 2015-05-30 MED ORDER — ASPIRIN EC 81 MG PO TBEC
81.0000 mg | DELAYED_RELEASE_TABLET | Freq: Every day | ORAL | Status: DC
  Administered 2015-05-30 – 2015-06-05 (×7): 81 mg via ORAL
  Filled 2015-05-30 (×7): qty 1

## 2015-05-30 MED ORDER — LATANOPROST 0.005 % OP SOLN
1.0000 [drp] | Freq: Every day | OPHTHALMIC | Status: DC
  Administered 2015-05-30 – 2015-06-04 (×6): 1 [drp] via OPHTHALMIC
  Filled 2015-05-30: qty 2.5

## 2015-05-30 MED ORDER — ALLOPURINOL 100 MG PO TABS
100.0000 mg | ORAL_TABLET | Freq: Every day | ORAL | Status: DC
  Administered 2015-05-30 – 2015-06-05 (×7): 100 mg via ORAL
  Filled 2015-05-30 (×7): qty 1

## 2015-05-30 MED ORDER — DILTIAZEM HCL 25 MG/5ML IV SOLN
10.0000 mg | Freq: Once | INTRAVENOUS | Status: AC
  Administered 2015-05-30: 10 mg via INTRAVENOUS
  Filled 2015-05-30: qty 5

## 2015-05-30 NOTE — Progress Notes (Signed)
Subjective:  Patient is a 81 year old male with history of ESRD, hypertension, antral progression congestive heart failure.  [facility]  He was sent over for decreased oxygen saturation, fever, cough and generalized weakness. He underwent left arm radiocephalic AV fistula angiogram but because of occluded left arm cephalic vein, it was unable to be be cannulized and could not be salvaged   Objective:  Vital signs in last 24 hours:  Temp:  [99.5 F (37.5 C)-102.3 F (39.1 C)] 99.5 F (37.5 C) (11/22 1344) Pulse Rate:  [35-152] 35 (11/22 1344) Resp:  [11-29] 20 (11/22 1344) BP: (97-138)/(58-84) 137/58 mmHg (11/22 1344) SpO2:  [74 %-98 %] 74 % (11/22 1344) Weight:  [100.971 kg (222 lb 9.6 oz)-103.42 kg (228 lb)] 100.971 kg (222 lb 9.6 oz) (11/22 1344)  Weight change:  Filed Weights   05/30/15 0913 05/30/15 1344  Weight: 103.42 kg (228 lb) 100.971 kg (222 lb 9.6 oz)    Intake/Output:   No intake or output data in the 24 hours ending 05/30/15 1355  HEMODIALYSIS FLOW SHEET:      Physical Exam: General: NAD, laying in bed  HEENT Moist oral mucus membranes  Neck supple  Pulm/lungs Shallow effort, clear ant/lat  CVS/Heart Irregular, tachycardic  Abdomen:  Soft, mild distention  Extremities: No edema  Neurologic: Lethargic,   Skin: No acute rashes  Access:        Basic Metabolic Panel:   Recent Labs Lab 05/29/15 1142 05/30/15 0911  NA 142 144  K 3.6  3.7 3.9  CL 100* 102  CO2 31 30  GLUCOSE 166* 163*  BUN 42* 51*  CREATININE 3.62* 4.48*  CALCIUM 8.7* 8.6*     CBC:  Recent Labs Lab 05/29/15 1142 05/30/15 0911  WBC 10.2 16.3*  NEUTROABS  --  14.6*  HGB 9.9* 9.9*  HCT 32.0* 32.4*  MCV 82.8 81.7  PLT 335 319      Microbiology:  No results found for this or any previous visit (from the past 720 hour(s)).  Coagulation Studies:  Recent Labs  05/29/15 1142 05/30/15 0911  LABPROT 15.4* 16.0*  INR 1.20  1.27    Urinalysis:  Recent Labs  05/30/15 0940  KETONESUR NEGATIVE      Imaging: Dg Chest Port 1 View  05/30/2015  CLINICAL DATA:  Fever and atrial fibrillation EXAM: PORTABLE CHEST 1 VIEW COMPARISON:  May 29, 2015 FINDINGS: Central catheter tip is at the cavoatrial junction. No pneumothorax. There is increase in airspace consolidation in the left lower lobe with left effusion. Right lung is clear. Heart is mildly enlarged with pulmonary vascular within normal limits. There is atherosclerotic calcification in the aorta. No adenopathy appreciable. IMPRESSION: Increase in left lower lobe consolidation with left effusion. Suspect pneumonia left lower lobe. Right lung clear. Heart prominent but stable. No pneumothorax. Electronically Signed   By: Bretta BangWilliam  Woodruff III M.D.   On: 05/30/2015 09:26   Dg Chest Port 1 View  05/29/2015  CLINICAL DATA:  Hypoxia postprocedure, congestion, cough EXAM: PORTABLE CHEST 1 VIEW COMPARISON:  Portable chest x-ray of 05/16/2015 FINDINGS: There is vague indistinctness of the left hemidiaphragm as noted previously and atelectasis and/or effusion cannot be excluded. Mild volume loss is noted at the right lung base. Cardiomegaly is stable and minimal pulmonary vascular congestion cannot be excluded. A large bore right central venous line is noted with tip at the expected SVC -RA junction. No pneumothorax is seen. IMPRESSION: 1. Bibasilar opacities left-greater-than-right  most consistent with atelectasis. Cannot exclude small effusion at the left lung base. 2. Stable cardiomegaly. Question mild pulmonary vascular congestion. Electronically Signed   By: Dwyane Dee M.D.   On: 05/29/2015 12:24     Medications:     . piperacillin-tazobactam (ZOSYN)  IV  3.375 g Intravenous Q12H  . vancomycin  1,000 mg Intravenous Once  . [START ON 06/01/2015] vancomycin  1,000 mg Intravenous Q T,Th,Sa-HD     Assessment/ Plan:  81 y.o.[place] male with right eye  blindness, diastolic congestive heart failure, hypertension, gout, glaucoma, peripheral neuropathy, atrial fibrillation, anemia, carotid stenosis status post bilateral CEA, CVA, peripheral vascular disease who was admitted to [facility] on 04/08/2015 for shortness of breath.   Follows with [facility]/ TTS  1. End Stage Renal Disease: patient started on hemodialysis 10/6. -  Due to irregular heart rate, hemodynamic instability, will plan for dialysis tomorrow  2. Anemia of chronic kidney disease: hgb 9.9, continue epogen with HD.   3. Secondary Hyperparathyroidism:  - monitor phosphorus while in the hospital  4.  Left lower lobe pneumonia - Antibiotics and management as per hospitalist    LOS: 0 Dakoda [place] 11/22/20161:55 PM

## 2015-05-30 NOTE — Progress Notes (Signed)
Patient alert and oriented x2. Oriented to room, unit, and call bell. Admission completed. No complaints at this time. Will cont to assess. Skin assessment verified by Margaretmary DysJessica Christmas, RN. Telemetry box verified. Trudee KusterBrandi R Mansfield

## 2015-05-30 NOTE — ED Notes (Signed)
Patient is resting comfortably. 

## 2015-05-30 NOTE — Progress Notes (Signed)
Patient paperwork from nursing home and POA stated that patient is full code. Dr. Imogene Burnhen gone for day, Dr. Allena KatzPatel notified ordered for code to be changed to full and ST ordered because patient coughed several times when trying to drink water. Trudee KusterBrandi R Mansfield

## 2015-05-30 NOTE — ED Notes (Addendum)
This RN spoke to MD Imogene Burnhen regarding pt's bed status and HR, MD Imogene Burnhen verbalized to send pt to 2A, ICU is not needed at this time. MD Imogene Burnhen states tachycardia is due to sepsis rather than afib. Verbal orders given at this time, SEE MAR.

## 2015-05-30 NOTE — ED Notes (Signed)
MD Chen at bedside.

## 2015-05-30 NOTE — Consult Note (Signed)
[facility] Cardiology  CARDIOLOGY CONSULT NOTE  Patient ID: Sean Delgado. MRN: [id number] DOB/AGE: Jan 17, 1934 81 y.o.  Admit date: 05/30/2015 Referring Physician Imogene Burn Primary Physician  Primary Cardiologist Fath Reason for Consultation atrial fibrillation  HPI: 81 year old gentleman referred for evaluation of atrial fibrillation with a rapid ventricular rate. The patient has known history of essential hypertension, end-stage renal disease on chronic hemodialysis, chronic diastolic congestive heart failure and atrial fibrillation. The patient was sent from using home to [facility] emergency room with her, cough, weakness, found to be hypoxic, febrile, tachycardic. Chest x-ray revealed evidence for lower lobe pneumonia. Patient currently denies chest pain or shortness of breath.  Review of systems complete and found to be negative unless listed above     Past Medical History  Diagnosis Date  . Chronic renal failure, stage 4 (severe) (HCC)   . Hypertension   . CHF (congestive heart failure) (HCC)   . Gout   . Glaucoma   . Neuropathy (HCC)   . Stroke (HCC)   . Peripheral vascular disease (HCC)   . Coronary artery disease   . A-fib (HCC)   . Dysrhythmia   . Blindness of right eye   . Anemia     iron deficiency    Past Surgical History  Procedure Laterality Date  . Vascular surgery Bilateral     Carotid Endarterectomy  . Eye surgery Left     Cataract Extraction  . Av fistula placement Left 04/06/2015    Procedure: ARTERIOVENOUS (AV) FISTULA CREATION;  Surgeon: Annice Needy, MD;  Location: [facility] ORS;  Service: Vascular;  Laterality: Left;  . Peripheral vascular catheterization N/A 04/12/2015    Procedure: Dialysis/Perma Catheter Insertion;  Surgeon: Annice Needy, MD;  Location: [facility] INVASIVE CV LAB;  Service: Cardiovascular;  Laterality: N/A;  . Peripheral vascular catheterization N/A 05/18/2015    Procedure: Dialysis/Perma Catheter Insertion;  Surgeon: Annice Needy, MD;  Location: [facility]  INVASIVE CV LAB;  Service: Cardiovascular;  Laterality: N/A;  . Peripheral vascular catheterization Left 05/29/2015    Procedure: A/V Shuntogram/Fistulagram;  Surgeon: Annice Needy, MD;  Location: [facility] INVASIVE CV LAB;  Service: Cardiovascular;  Laterality: Left;  . Peripheral vascular catheterization N/A 05/29/2015    Procedure: A/V Shunt Intervention;  Surgeon: Annice Needy, MD;  Location: [facility] INVASIVE CV LAB;  Service: Cardiovascular;  Laterality: N/A;    Prescriptions prior to admission  Medication Sig Dispense Refill Last Dose  . acetaminophen (TYLENOL) 325 MG tablet Take 650 mg by mouth every 6 (six) hours as needed. For pain.   unknown  . allopurinol (ZYLOPRIM) 100 MG tablet Take 100 mg by mouth daily.   unknown  . aspirin EC 81 MG tablet Take 81 mg by mouth daily.   unknown  . atorvastatin (LIPITOR) 20 MG tablet Take 1 tablet (20 mg total) by mouth daily. 30 tablet 0 unknown  . b complex-vitamin c-folic acid (NEPHRO-VITE) 0.8 MG TABS tablet Take 1 tablet by mouth daily.   unknown  . brimonidine (ALPHAGAN) 0.2 % ophthalmic solution Place 1 drop into the left eye 2 (two) times daily.   unknown  . clopidogrel (PLAVIX) 75 MG tablet Take 75 mg by mouth daily.   unknown  . diltiazem (CARDIZEM CD) 180 MG 24 hr capsule Take 1 capsule (180 mg total) by mouth daily. 30 capsule 0 unknown  . ferrous sulfate 325 (65 FE) MG EC tablet Take 325 mg by mouth daily.   unknown  . furosemide (LASIX) 40 MG tablet Take  1 tablet (40 mg total) by mouth 2 (two) times daily. 30 tablet 0 unknown  . HYDROcodone-acetaminophen (NORCO/VICODIN) 5-325 MG tablet Take 1 tablet by mouth every 6 (six) hours as needed for moderate pain. (Patient taking differently: Take 1 tablet by mouth every 6 (six) hours. For 4 days starting on 05/26/15.) 30 tablet 0 unknown  . isosorbide mononitrate (IMDUR) 60 MG 24 hr tablet Take 60 mg by mouth daily.   unknown  . latanoprost (XALATAN) 0.005 % ophthalmic solution Place 1 drop into both  eyes at bedtime.   unknown  . lidocaine (LIDODERM) 5 % Place 1 patch onto the skin daily. Remove & Discard patch within 12 hours or as directed by MD   unknown  . metoprolol (TOPROL-XL) 200 MG 24 hr tablet Take 200 mg by mouth daily.   unknown  . miconazole (MICOTIN) 2 % cream Apply 1 application topically daily. Clean and apply to groin   unknown  . pregabalin (LYRICA) 75 MG capsule Take 75 mg by mouth daily.    unknown  . senna-docusate (SENOKOT-S) 8.6-50 MG per tablet Take 2 tablets by mouth at bedtime.   unknown  . sodium bicarbonate 650 MG tablet Take 650 mg by mouth 2 (two) times daily.   unknown  . tolnaftate (TINACTIN) 1 % spray Apply 1 spray topically daily. To feet.   unknown  . Vitamins A & D (VITAMIN A & D) ointment Apply 1 application topically daily. To feet.   unknown  . amoxicillin-clavulanate (AUGMENTIN) 500-125 MG tablet Take 1 tablet (500 mg total) by mouth daily. (Patient not taking: Reported on 05/30/2015) 5 tablet 0 Past Week at Unknown time  . warfarin (COUMADIN) 5 MG tablet Take 1 tablet (5 mg total) by mouth daily at 6 PM. (Patient not taking: Reported on 05/30/2015) 30 tablet 0 Completed Course at Unknown time   Social History   Social History  . Marital Status: Single    Spouse Name: N/A  . Number of Children: N/A  . Years of Education: N/A   Occupational History  . Not on file.   Social History Main Topics  . Smoking status: Former Smoker -- 0.25 packs/day for 15 years    Types: Cigarettes    Quit date: 09/06/2006  . Smokeless tobacco: Never Used  . Alcohol Use: No  . Drug Use: No  . Sexual Activity: Not on file   Other Topics Concern  . Not on file   Social History Narrative    Family History  Problem Relation Age of Onset  . Diabetes Mother       Review of systems complete and found to be negative unless listed above      PHYSICAL EXAM  General: Well developed, well nourished, in no acute distress HEENT:  Normocephalic and  atramatic Neck:  No JVD.  Lungs: Clear bilaterally to auscultation and percussion. Heart: HRRR . Normal S1 and S2 without gallops or murmurs.  Abdomen: Bowel sounds are positive, abdomen soft and non-tender  Msk:  Back normal, normal gait. Normal strength and tone for age. Extremities: No clubbing, cyanosis or edema.   Neuro: Alert and oriented X 3. Psych:  Good affect, responds appropriately  Labs:   Lab Results  Component Value Date   WBC 16.3* 05/30/2015   HGB 9.9* 05/30/2015   HCT 32.4* 05/30/2015   MCV 81.7 05/30/2015   PLT 319 05/30/2015    Recent Labs Lab 05/30/15 0911  NA 144  K 3.9  CL 102  CO2 30  BUN 51*  CREATININE 4.48*  CALCIUM 8.6*  PROT 8.2*  BILITOT 1.1  ALKPHOS 99  ALT 18  AST 27  GLUCOSE 163*   Lab Results  Component Value Date   TROPONINI 0.05* 05/30/2015   No results found for: CHOL No results found for: HDL No results found for: LDLCALC No results found for: TRIG No results found for: CHOLHDL No results found for: LDLDIRECT    Radiology: Dg Chest 1 View  05/16/2015  CLINICAL DATA:  Fever. EXAM: CHEST 1 VIEW COMPARISON:  04/17/2015 FINDINGS: Tip of the right dialysis catheter at the atrial caval junction. The heart is enlarged, unchanged. No pulmonary edema. Limited left basilar assessment due to body habitus and portable technique. The lungs are otherwise clear. No pneumothorax. No acute osseous abnormalities are seen. IMPRESSION: Limited left basilar assessment.  Lungs otherwise clear. Electronically Signed   By: Rubye Oaks M.D.   On: 05/16/2015 21:20   Dg Chest Port 1 View  05/30/2015  CLINICAL DATA:  Fever and atrial fibrillation EXAM: PORTABLE CHEST 1 VIEW COMPARISON:  May 29, 2015 FINDINGS: Central catheter tip is at the cavoatrial junction. No pneumothorax. There is increase in airspace consolidation in the left lower lobe with left effusion. Right lung is clear. Heart is mildly enlarged with pulmonary vascular within normal  limits. There is atherosclerotic calcification in the aorta. No adenopathy appreciable. IMPRESSION: Increase in left lower lobe consolidation with left effusion. Suspect pneumonia left lower lobe. Right lung clear. Heart prominent but stable. No pneumothorax. Electronically Signed   By: Bretta Bang III M.D.   On: 05/30/2015 09:26   Dg Chest Port 1 View  05/29/2015  CLINICAL DATA:  Hypoxia postprocedure, congestion, cough EXAM: PORTABLE CHEST 1 VIEW COMPARISON:  Portable chest x-ray of 05/16/2015 FINDINGS: There is vague indistinctness of the left hemidiaphragm as noted previously and atelectasis and/or effusion cannot be excluded. Mild volume loss is noted at the right lung base. Cardiomegaly is stable and minimal pulmonary vascular congestion cannot be excluded. A large bore right central venous line is noted with tip at the expected SVC -RA junction. No pneumothorax is seen. IMPRESSION: 1. Bibasilar opacities left-greater-than-right most consistent with atelectasis. Cannot exclude small effusion at the left lung base. 2. Stable cardiomegaly. Question mild pulmonary vascular congestion. Electronically Signed   By: Dwyane Dee M.D.   On: 05/29/2015 12:24    EKG: Atrial fibrillation  ASSESSMENT AND PLAN:   1. Atrial fibrillation with a rapid ventricular rate, appropriately compensatory for fever, sepsis, pneumonia, hypoxia 2. Left lower lobe pneumonia/sepsis  Recommendations  1. Agree with overall current therapy 2. Continue aspirin and clopidogrel, defer warfarin therapy at this time 3. Continue Cardizem, up titrate for rate control 4. Defer further cardiac diagnostics at this time   Signed: Lashaya Kienitz MD,PhD, [facility] 05/30/2015, 6:22 PM

## 2015-05-30 NOTE — ED Notes (Signed)
Pts daughter, Sunny SchleinFelicia [phone].

## 2015-05-30 NOTE — ED Notes (Addendum)
Pt dialysis schedule: Tuesday, Thursday, Saturday.

## 2015-05-30 NOTE — H&P (Addendum)
[facility] - [place] at [facility]   PATIENT NAME: Sean Delgado    MR#:  [id number]  DATE OF BIRTH:  09/17/1933  DATE OF ADMISSION:  05/30/2015  PRIMARY CARE PHYSICIAN: Bobbye Morton, MD   REQUESTING/REFERRING PHYSICIAN: Jene Every, MD  CHIEF COMPLAINT:   Chief Complaint  Patient presents with  . Atrial Fibrillation  . Fever  . Fatigue   fever, cough and weakness today.  HISTORY OF PRESENT ILLNESS:  Sean Delgado  is a 81 y.o. male with a known history of hypertension, ESRD on hemodialysis, A. fib and CHF. The patient was sent to ED from nursing home due to above mentioned chief complaint. The patient is alert and awake but cannot provide information. He only complains of fever, cough and generalized weakness. He denies any other symptoms. According to patient's niece, the patient has AV fistula malfunction and got AV fistula procedure yesterday. He started to have fever, cough and generalized weakness thereafter. The patient's general condition has been declining for the past one month. He cannot walk at all for the past one month. He has AV fistula malfunction. The patient was found hypoxia with O2 saturation 85% without oxygen in the ED. In addition he has fever 103 and tachycardia at about 130. Chest x-ray showed left base pneumonia. He was treated with vancomycin and Zosyn in the ED.  PAST MEDICAL HISTORY:   Past Medical History  Diagnosis Date  . Chronic renal failure, stage 4 (severe) (HCC)   . Hypertension   . CHF (congestive heart failure) (HCC)   . Gout   . Glaucoma   . Neuropathy (HCC)   . Stroke (HCC)   . Peripheral vascular disease (HCC)   . Coronary artery disease   . A-fib (HCC)   . Dysrhythmia   . Blindness of right eye   . Anemia     iron deficiency    PAST SURGICAL HISTORY:   Past Surgical History  Procedure Laterality Date  . Vascular surgery Bilateral     Carotid Endarterectomy  . Eye surgery Left     Cataract  Extraction  . Av fistula placement Left 04/06/2015    Procedure: ARTERIOVENOUS (AV) FISTULA CREATION;  Surgeon: Annice Needy, MD;  Location: [facility] ORS;  Service: Vascular;  Laterality: Left;  . Peripheral vascular catheterization N/A 04/12/2015    Procedure: Dialysis/Perma Catheter Insertion;  Surgeon: Annice Needy, MD;  Location: [facility] INVASIVE CV LAB;  Service: Cardiovascular;  Laterality: N/A;  . Peripheral vascular catheterization N/A 05/18/2015    Procedure: Dialysis/Perma Catheter Insertion;  Surgeon: Annice Needy, MD;  Location: [facility] INVASIVE CV LAB;  Service: Cardiovascular;  Laterality: N/A;  . Peripheral vascular catheterization Left 05/29/2015    Procedure: A/V Shuntogram/Fistulagram;  Surgeon: Annice Needy, MD;  Location: [facility] INVASIVE CV LAB;  Service: Cardiovascular;  Laterality: Left;  . Peripheral vascular catheterization N/A 05/29/2015    Procedure: A/V Shunt Intervention;  Surgeon: Annice Needy, MD;  Location: [facility] INVASIVE CV LAB;  Service: Cardiovascular;  Laterality: N/A;    SOCIAL HISTORY:   Social History  Substance Use Topics  . Smoking status: Former Smoker -- 0.25 packs/day for 15 years    Types: Cigarettes    Quit date: 09/06/2006  . Smokeless tobacco: Never Used  . Alcohol Use: No    FAMILY HISTORY:   Family History  Problem Relation Age of Onset  . Diabetes Mother     DRUG ALLERGIES:  No Known Allergies  REVIEW  OF SYSTEMS:  CONSTITUTIONAL: Has fever and generalized weakness.  EYES: Blindness of right eye EARS, NOSE, AND THROAT: No tinnitus or ear pain.  RESPIRATORY: Has cough, shortness of breath, no wheezing or hemoptysis.  CARDIOVASCULAR: No chest pain, orthopnea, edema.  GASTROINTESTINAL: No nausea, vomiting, diarrhea or abdominal pain.  GENITOURINARY: No dysuria, hematuria.  ENDOCRINE: No polyuria, nocturia,  HEMATOLOGY: No anemia, easy bruising or bleeding SKIN: No rash or lesion. MUSCULOSKELETAL: No joint pain or arthritis.   NEUROLOGIC: No  tingling, numbness, weakness.  PSYCHIATRY: No anxiety or depression.   MEDICATIONS AT HOME:   Prior to Admission medications   Medication Sig Start Date End Date Taking? Authorizing Provider  acetaminophen (TYLENOL) 325 MG tablet Take 650 mg by mouth every 6 (six) hours as needed. For pain.   Yes Historical Provider, MD  allopurinol (ZYLOPRIM) 100 MG tablet Take 100 mg by mouth daily.   Yes Historical Provider, MD  aspirin EC 81 MG tablet Take 81 mg by mouth daily.   Yes Historical Provider, MD  atorvastatin (LIPITOR) 20 MG tablet Take 1 tablet (20 mg total) by mouth daily. 04/17/15  Yes Enid Baas, MD  b complex-vitamin c-folic acid (NEPHRO-VITE) 0.8 MG TABS tablet Take 1 tablet by mouth daily.   Yes Historical Provider, MD  brimonidine (ALPHAGAN) 0.2 % ophthalmic solution Place 1 drop into the left eye 2 (two) times daily.   Yes Historical Provider, MD  clopidogrel (PLAVIX) 75 MG tablet Take 75 mg by mouth daily.   Yes Historical Provider, MD  diltiazem (CARDIZEM CD) 180 MG 24 hr capsule Take 1 capsule (180 mg total) by mouth daily. 04/17/15  Yes Enid Baas, MD  ferrous sulfate 325 (65 FE) MG EC tablet Take 325 mg by mouth daily.   Yes Historical Provider, MD  furosemide (LASIX) 40 MG tablet Take 1 tablet (40 mg total) by mouth 2 (two) times daily. 04/17/15  Yes Enid Baas, MD  HYDROcodone-acetaminophen (NORCO/VICODIN) 5-325 MG tablet Take 1 tablet by mouth every 6 (six) hours as needed for moderate pain. Patient taking differently: Take 1 tablet by mouth every 6 (six) hours. For 4 days starting on 05/26/15. 04/17/15  Yes Enid Baas, MD  isosorbide mononitrate (IMDUR) 60 MG 24 hr tablet Take 60 mg by mouth daily.   Yes Historical Provider, MD  latanoprost (XALATAN) 0.005 % ophthalmic solution Place 1 drop into both eyes at bedtime.   Yes Historical Provider, MD  lidocaine (LIDODERM) 5 % Place 1 patch onto the skin daily. Remove & Discard patch within 12 hours or as  directed by MD   Yes Historical Provider, MD  metoprolol (TOPROL-XL) 200 MG 24 hr tablet Take 200 mg by mouth daily.   Yes Historical Provider, MD  miconazole (MICOTIN) 2 % cream Apply 1 application topically daily. Clean and apply to groin   Yes Historical Provider, MD  pregabalin (LYRICA) 75 MG capsule Take 75 mg by mouth daily.    Yes Historical Provider, MD  senna-docusate (SENOKOT-S) 8.6-50 MG per tablet Take 2 tablets by mouth at bedtime.   Yes Historical Provider, MD  sodium bicarbonate 650 MG tablet Take 650 mg by mouth 2 (two) times daily.   Yes Historical Provider, MD  tolnaftate (TINACTIN) 1 % spray Apply 1 spray topically daily. To feet.   Yes Historical Provider, MD  Vitamins A & D (VITAMIN A & D) ointment Apply 1 application topically daily. To feet.   Yes Historical Provider, MD  amoxicillin-clavulanate (AUGMENTIN) 500-125 MG tablet Take 1  tablet (500 mg total) by mouth daily. Patient not taking: Reported on 05/30/2015 04/17/15   Enid Baas, MD  warfarin (COUMADIN) 5 MG tablet Take 1 tablet (5 mg total) by mouth daily at 6 PM. Patient not taking: Reported on 05/30/2015 04/17/15   Enid Baas, MD      VITAL SIGNS:  Blood pressure 138/59, pulse 130, temperature 102.3 F (39.1 C), temperature source Oral, resp. rate 23, height 6\' 1"  (1.854 m), weight 103.42 kg (228 lb), SpO2 97 %.  PHYSICAL EXAMINATION:  GENERAL:  81 y.o.-year-old patient lying in the bed with no acute distress.  EYES: Pupils equal, round, reactive to light and accommodation. No scleral icterus. Extraocular muscles intact.  HEENT: Head atraumatic, normocephalic. Oropharynx and nasopharynx clear.  NECK:  Supple, no jugular venous distention. No thyroid enlargement, no tenderness.  LUNGS: Diminished breath sounds bilaterally, no wheezing, rales,rhonchi or crepitation. No use of accessory muscles of respiration. Permacath on the right side of the chest. CARDIOVASCULAR: S1, S2 normal. No murmurs, rubs,  or gallops.  ABDOMEN: Soft, nontender, nondistended. Bowel sounds present. No organomegaly or mass.  EXTREMITIES: No pedal edema, cyanosis, or clubbing.  NEUROLOGIC: Cranial nerves II through XII are intact. Muscle strength 3/5 in all extremities. Sensation intact. Gait not checked.  PSYCHIATRIC: The patient is alert and oriented x 3.  SKIN: No obvious rash, lesion, or ulcer.   LABORATORY PANEL:   CBC  Recent Labs Lab 05/30/15 0911  WBC 16.3*  HGB 9.9*  HCT 32.4*  PLT 319   ------------------------------------------------------------------------------------------------------------------  Chemistries   Recent Labs Lab 05/30/15 0911  NA 144  K 3.9  CL 102  CO2 30  GLUCOSE 163*  BUN 51*  CREATININE 4.48*  CALCIUM 8.6*  AST 27  ALT 18  ALKPHOS 99  BILITOT 1.1   ------------------------------------------------------------------------------------------------------------------  Cardiac Enzymes  Recent Labs Lab 05/30/15 0911  TROPONINI 0.05*   ------------------------------------------------------------------------------------------------------------------  RADIOLOGY:  Dg Chest Port 1 View  05/30/2015  CLINICAL DATA:  Fever and atrial fibrillation EXAM: PORTABLE CHEST 1 VIEW COMPARISON:  May 29, 2015 FINDINGS: Central catheter tip is at the cavoatrial junction. No pneumothorax. There is increase in airspace consolidation in the left lower lobe with left effusion. Right lung is clear. Heart is mildly enlarged with pulmonary vascular within normal limits. There is atherosclerotic calcification in the aorta. No adenopathy appreciable. IMPRESSION: Increase in left lower lobe consolidation with left effusion. Suspect pneumonia left lower lobe. Right lung clear. Heart prominent but stable. No pneumothorax. Electronically Signed   By: Bretta Bang III M.D.   On: 05/30/2015 09:26   Dg Chest Port 1 View  05/29/2015  CLINICAL DATA:  Hypoxia postprocedure, congestion,  cough EXAM: PORTABLE CHEST 1 VIEW COMPARISON:  Portable chest x-ray of 05/16/2015 FINDINGS: There is vague indistinctness of the left hemidiaphragm as noted previously and atelectasis and/or effusion cannot be excluded. Mild volume loss is noted at the right lung base. Cardiomegaly is stable and minimal pulmonary vascular congestion cannot be excluded. A large bore right central venous line is noted with tip at the expected SVC -RA junction. No pneumothorax is seen. IMPRESSION: 1. Bibasilar opacities left-greater-than-right most consistent with atelectasis. Cannot exclude small effusion at the left lung base. 2. Stable cardiomegaly. Question mild pulmonary vascular congestion. Electronically Signed   By: Dwyane Dee M.D.   On: 05/29/2015 12:24    EKG:   Orders placed or performed during the hospital encounter of 05/30/15  . EKG 12-Lead  . EKG 12-Lead  IMPRESSION AND PLAN:   Sepsis Left lower lobe pneumonia (HAP) Hypoxia A. fib with RVR ESRD on hemodialysis AV fistula malfunction Hypertension CAD History of CVA PVD History of CHF Anemia of chronic disease Blindness of right eye  The patient will be admitted to medical floor. I will continue vancomycin, Zosyn and Levaquin. Follow-up CBC, sputum culture and blood culture. Give gentle IV fluid support. But watch for fluid overload. Continue oxygen by nasal cannular with albuterol when necessary. Nephrology consult to continue hemodialysis. Continue hypertension medication. A. fib with RVR. Possible exacerbated by sepsis. Continue aspirin and Plavix and statin.  Cardizem 10 mg IV push 1 dose, get cardiology consult.  Continue Cardizem and Lopressor by mouth. The patient was on Coumadin which was taken off by some physicians. But the patient has high risk of fall, he is not a good candidate for anticoagulation. Follow-up with cardiologist for further recommendation.   Discussed with Dr. Thedore MinsSingh.  All the records are reviewed and case  discussed with ED provider. Management plans discussed with the patient, his niece (POA) and they are in agreement.  CODE STATUS: DO NOT RESUSCITATE  TOTAL TIME TAKING CARE OF THIS PATIENT: 62 minutes.    Shaune Pollackhen, Dim Meisinger M.D on 05/30/2015 at 11:24 AM  Between 7am to 6pm - Pager - [phone]  After 6pm go to [email or web address] - password [email or web address] [facility]  [facility] [place]  Office  [phone]  CC: Primary care physician; Bobbye MortonSHARON A REILLY, MD

## 2015-05-30 NOTE — ED Notes (Signed)
Pt here via EMS with c/o lethargy, fever, and afib with frequent PVC's, rate 120's to 150's. Pt c/o pain in legs bilaterally. Alert and oriented at this time.

## 2015-05-30 NOTE — ED Notes (Signed)
This RN attempted to call report, RN on 2C unable to take report due to pt's HR of 110-140. RN paging MD Imogene Burnhen at this time.

## 2015-05-30 NOTE — ED Notes (Addendum)
RN spoke to [employer] Supervisor, Larena SoxJacki regarding pt's bed status. Will be making changes to bed assignment at this time. Pt and family made aware and verbalized understanding at this time.

## 2015-05-30 NOTE — ED Notes (Signed)
RN attempted to call report to 2A, no RN's available at this time to take report. Will call back momentarily.

## 2015-05-30 NOTE — ED Notes (Signed)
Family at bedside. 

## 2015-05-30 NOTE — ED Provider Notes (Addendum)
[facility] Emergency Department Provider Note  ____________________________________________  Time seen: On [place] [facility]  I have reviewed the triage vital signs and the nursing notes.   HISTORY  Chief Complaint Atrial Fibrillation; Fever; and Fatigue    HPI Sean Delgado. is a 81 y.o. male who was sent from [facility] for fever and tachycardiaand diffuse weakness. Patient reportedly had a fistulogram by Dr. dew yesterday in an attempt to salvage left fistula. Patient does have a right subclavian catheter which he uses for dialysis currently. EMS reports that the patient had a temperature of 103.2 and a heart rate as high as 160 upon their arrival to the facility. Patient does complain of a cough. No redness or pain at the site of the procedure that was done yesterday     Past Medical History  Diagnosis Date  . Chronic renal failure, stage 4 (severe) (HCC)   . Hypertension   . CHF (congestive heart failure) (HCC)   . Gout   . Glaucoma   . Neuropathy (HCC)   . Stroke (HCC)   . Peripheral vascular disease (HCC)   . Coronary artery disease   . A-fib (HCC)   . Dysrhythmia   . Blindness of right eye   . Anemia     iron deficiency    Patient Active Problem List   Diagnosis Date Noted  . Acute respiratory failure (HCC) 04/08/2015  . CHF (congestive heart failure) (HCC) 03/04/2015    Past Surgical History  Procedure Laterality Date  . Vascular surgery Bilateral     Carotid Endarterectomy  . Eye surgery Left     Cataract Extraction  . Av fistula placement Left 04/06/2015    Procedure: ARTERIOVENOUS (AV) FISTULA CREATION;  Surgeon: Annice Needy, MD;  Location: [facility] ORS;  Service: Vascular;  Laterality: Left;  . Peripheral vascular catheterization N/A 04/12/2015    Procedure: Dialysis/Perma Catheter Insertion;  Surgeon: Annice Needy, MD;  Location: [facility] INVASIVE CV LAB;  Service: Cardiovascular;  Laterality: N/A;  . Peripheral vascular  catheterization N/A 05/18/2015    Procedure: Dialysis/Perma Catheter Insertion;  Surgeon: Annice Needy, MD;  Location: [facility] INVASIVE CV LAB;  Service: Cardiovascular;  Laterality: N/A;    Current Outpatient Rx  Name  Route  Sig  Dispense  Refill  . acetaminophen (TYLENOL) 325 MG tablet   Oral   Take 650 mg by mouth every 6 (six) hours as needed. For pain.         Marland Kitchen allopurinol (ZYLOPRIM) 100 MG tablet   Oral   Take 100 mg by mouth daily.         Marland Kitchen amoxicillin-clavulanate (AUGMENTIN) 500-125 MG tablet   Oral   Take 1 tablet (500 mg total) by mouth daily.   5 tablet   0   . aspirin 81 MG chewable tablet   Oral   Chew 81 mg by mouth daily.         Marland Kitchen atorvastatin (LIPITOR) 20 MG tablet   Oral   Take 1 tablet (20 mg total) by mouth daily.   30 tablet   0   . brimonidine (ALPHAGAN) 0.2 % ophthalmic solution   Left Eye   Place 1 drop into the left eye 2 (two) times daily.         . clopidogrel (PLAVIX) 75 MG tablet   Oral   Take 75 mg by mouth daily.         Marland Kitchen diltiazem (CARDIZEM CD) 180 MG  24 hr capsule   Oral   Take 1 capsule (180 mg total) by mouth daily.   30 capsule   0   . ferrous sulfate 325 (65 FE) MG EC tablet   Oral   Take 325 mg by mouth daily.         . furosemide (LASIX) 40 MG tablet   Oral   Take 1 tablet (40 mg total) by mouth 2 (two) times daily.   30 tablet   0   . HYDROcodone-acetaminophen (NORCO/VICODIN) 5-325 MG tablet   Oral   Take 1 tablet by mouth every 6 (six) hours as needed for moderate pain.   30 tablet   0   . isosorbide mononitrate (IMDUR) 60 MG 24 hr tablet   Oral   Take 60 mg by mouth daily.         Marland Kitchen. latanoprost (XALATAN) 0.005 % ophthalmic solution   Both Eyes   Place 1 drop into both eyes at bedtime.         . metoprolol (TOPROL-XL) 200 MG 24 hr tablet   Oral   Take 200 mg by mouth daily.         . miconazole (MICOTIN) 2 % cream   Topical   Apply 1 application topically daily. Clean and apply to  groin         . Multiple Vitamins-Minerals (CENTRUM SILVER PO)   Oral   Take 1 tablet by mouth daily.         . pregabalin (LYRICA) 75 MG capsule   Oral   Take 75 mg by mouth 2 (two) times daily.         Marland Kitchen. senna-docusate (SENOKOT-S) 8.6-50 MG per tablet   Oral   Take 2 tablets by mouth at bedtime.         . sodium bicarbonate 650 MG tablet   Oral   Take 650 mg by mouth 2 (two) times daily.         Marland Kitchen. warfarin (COUMADIN) 5 MG tablet   Oral   Take 1 tablet (5 mg total) by mouth daily at 6 PM. Patient not taking: Reported on 05/18/2015   30 tablet   0     Allergies Review of patient's allergies indicates no known allergies.  Family History  Problem Relation Age of Onset  . Diabetes Mother     Social History Social History  Substance Use Topics  . Smoking status: Former Smoker -- 0.25 packs/day for 15 years    Types: Cigarettes    Quit date: 09/06/2006  . Smokeless tobacco: Never Used  . Alcohol Use: No    Review of Systems  Constitutional: Positive for fever Eyes: Limited when I ENT: Negative for sore throat Cardiovascular: Negative for chest pain. Respiratory: Positive for mild shortness of breath Gastrointestinal: Negative for abdominal pain, vomiting and diarrhea. Genitourinary: Doesn't make much urine Musculoskeletal: Negative for back pain. Skin: Negative for rash. Neurological: Negative for headaches or focal weakness Psychiatric: No anxiety    ____________________________________________   PHYSICAL EXAM:  VITAL SIGNS: ED Triage Vitals  Enc Vitals Group     BP --      Pulse --      Resp --      Temp --      Temp src --      SpO2 --      Weight --      Height --      Head Cir --  Peak Flow --      Pain Score --      Pain Loc --      Pain Edu? --      Excl. in GC? --      Constitutional: Alert and oriented. Well appearing and in no distress. Eyes: Conjunctivae are normal.  ENT   Head: Normocephalic and  atraumatic.   Mouth/Throat: Mucous membranes are moist. Cardiovascular: Tachycardia, irregularly irregular rhythm. Normal and symmetric distal pulses are present in all extremities.  Respiratory: Positive tachypnea is mild. Bibasilar rales noted Gastrointestinal: Soft and non-tender in all quadrants. No distention. There is no CVA tenderness. Genitourinary: deferred Musculoskeletal: Nontender with normal range of motion in all extremities. Mild lower extremity edema. No tenderness to palpation or redness or discharge at the site of the procedure done yesterday at the left radial artery Neurologic:  Normal speech and language. No gross focal neurologic deficits are appreciated. Skin:  Skin is warm, dry and intact. No rash noted. Psychiatric: Mood and affect are normal. Patient exhibits appropriate insight and judgment.  ____________________________________________    LABS (pertinent positives/negatives)  Labs Reviewed  CULTURE, BLOOD (ROUTINE X 2)  CULTURE, BLOOD (ROUTINE X 2)  URINE CULTURE  LACTIC ACID, PLASMA  LACTIC ACID, PLASMA  COMPREHENSIVE METABOLIC PANEL  CBC WITH DIFFERENTIAL/PLATELET  TROPONIN I  LIPASE, BLOOD  BRAIN NATRIURETIC PEPTIDE  APTT  PROTIME-INR  KETONES, URINE    ____________________________________________   EKG  ED ECG REPORT I, Jene Every, the attending physician, personally viewed and interpreted this ECG.   Date: 05/30/2015  EKG Time: 9:10 AM  Rate: 138  Rhythm: atrial fibrillation, rate 138  Axis: Left axis deviation  Intervals:Occasional PVCs  ST&T Change: Nonspecific   ____________________________________________    RADIOLOGY I ha ve personally reviewed any xrays that were ordered on this patient: Left-sided pneumonia/consolidation  ____________________________________________   PROCEDURES  Procedure(s) performed: none  Critical Care performed: yes  CRITICAL CARE Performed by: Jene Every   Total critical care  time: 30 minutes  Critical care time was exclusive of separately billable procedures and treating other patients.  Critical care was necessary to treat or prevent imminent or life-threatening deterioration.  Critical care was time spent personally by me on the following activities: development of treatment plan with patient and/or surrogate as well as nursing, discussions with consultants, evaluation of patient's response to treatment, examination of patient, obtaining history from patient or surrogate, ordering and performing treatments and interventions, ordering and review of laboratory studies, ordering and review of radiographic studies, pulse oximetry and re-evaluation of patient's condition.   ____________________________________________   INITIAL IMPRESSION / ASSESSMENT AND PLAN / ED COURSE  Pertinent labs & imaging results that were available during my care of the patient were reviewed by me and considered in my medical decision making (see chart for details).  Patient presents with fever and tachycardia. Patient with numerous comorbidities including end-stage renal disease and CHF. Certainly his clinical picture suspicious for sepsis and we have called a code sepsis. Although he had a minor procedure done yesterday I do not believe this is the source of his infection. He is hypoxic when off oxygen so I'm suspicious for pneumonia. We will reevaluate pending chest x-ray and labs.  ----------------------------------------- 9:40 AM on 05/30/2015 -----------------------------------------   Chest x-ray consistent with  left lower lobe pneumonia with effusion. This is consistent with fever tachycardia tachypnea and hypoxia. We will give vancomycin and Zosyn for potential healthcare acquired pneumonia.  Patient with end-stage renal disease:  We are giving 500 cc boluses slowly and carefully so as not to worsen his respiratory status    ____________________________________________   FINAL CLINICAL IMPRESSION(S) / ED DIAGNOSES  Final diagnoses:  Sepsis, due to unspecified organism [facility])  Aspiration pneumonia of left lower lobe, unspecified aspiration pneumonia type [facility])  Hypoxia     Jene Every, MD 05/30/15 1057  Jene Every, MD 05/30/15 1058

## 2015-05-30 NOTE — Progress Notes (Signed)
ANTIBIOTIC CONSULT NOTE - INITIAL  Pharmacy Consult for Vancomycin/Zosyn  Indication: Sepsis/HAP  No Known Allergies  Patient Measurements: Height: 6\' 1"  (185.4 cm) Weight: 228 lb (103.42 kg) IBW/kg (Calculated) : 79.9 Adjusted Body Weight: 89.3 kg  Vital Signs: Temp: 102.3 F (39.1 C) (11/22 0913) Temp Source: Oral (11/22 0913) BP: 112/62 mmHg (11/22 1300) Pulse Rate: 132 (11/22 1300) Intake/Output from previous day:   Intake/Output from this shift:    Labs:  Recent Labs  05/29/15 1142 05/30/15 0911  WBC 10.2 16.3*  HGB 9.9* 9.9*  PLT 335 319  CREATININE 3.62* 4.48*   Estimated Creatinine Clearance: 16.9 mL/min (by C-G formula based on Cr of 4.48). No results for input(s): VANCOTROUGH, VANCOPEAK, VANCORANDOM, GENTTROUGH, GENTPEAK, GENTRANDOM, TOBRATROUGH, TOBRAPEAK, TOBRARND, AMIKACINPEAK, AMIKACINTROU, AMIKACIN in the last 72 hours.   Microbiology: No results found for this or any previous visit (from the past 720 hour(s)).  Medical History: Past Medical History  Diagnosis Date  . Chronic renal failure, stage 4 (severe) (HCC)   . Hypertension   . CHF (congestive heart failure) (HCC)   . Gout   . Glaucoma   . Neuropathy (HCC)   . Stroke (HCC)   . Peripheral vascular disease (HCC)   . Coronary artery disease   . A-fib (HCC)   . Dysrhythmia   . Blindness of right eye   . Anemia     iron deficiency    Medications:  Scheduled:  . piperacillin-tazobactam (ZOSYN)  IV  3.375 g Intravenous Once  . piperacillin-tazobactam (ZOSYN)  IV  3.375 g Intravenous Q12H  . vancomycin  1,000 mg Intravenous Once  . [START ON 06/01/2015] vancomycin  1,000 mg Intravenous Q T,Th,Sa-HD   Assessment: Patient is a 81 yo male admitted for possible sepsis/HAP.  Patient with ESRD requiring HD on Tues/Thurs/Sat.  Unclear as to when his las HD session was.    Goal of Therapy:  Vancomycin trough level 15-25 mcg/mL  Plan:  Patient received Vancomycin 1 gm IV once in ED at  0952.  Will order an additional Vancomycin 1 gm IV to be given in addition to make total loading dose of 2 gm.  Will then order Vancomycin 1 gm IV with each HD session based on Tues/Thurs/Sat schedule.  Unclear when next HD session will be.  Therefore, will need to follow closely for change in timing of trough and additional doses. Follow up culture results.    Pharmacy will continue to follow.    Akita Maxim G 05/30/2015,1:41 PM

## 2015-05-31 DIAGNOSIS — L8990 Pressure ulcer of unspecified site, unspecified stage: Secondary | ICD-10-CM | POA: Diagnosis present

## 2015-05-31 LAB — BASIC METABOLIC PANEL
Anion gap: 11 (ref 5–15)
BUN: 59 mg/dL — AB (ref 6–20)
CHLORIDE: 104 mmol/L (ref 101–111)
CO2: 30 mmol/L (ref 22–32)
Calcium: 8.2 mg/dL — ABNORMAL LOW (ref 8.9–10.3)
Creatinine, Ser: 4.53 mg/dL — ABNORMAL HIGH (ref 0.61–1.24)
GFR calc Af Amer: 13 mL/min — ABNORMAL LOW (ref >60)
GFR calc non Af Amer: 11 mL/min — ABNORMAL LOW (ref >60)
GLUCOSE: 108 mg/dL — AB (ref 65–99)
POTASSIUM: 3.8 mmol/L (ref 3.5–5.1)
Sodium: 145 mmol/L (ref 135–145)

## 2015-05-31 LAB — CBC
HEMATOCRIT: 26.8 % — AB (ref 40.0–52.0)
Hemoglobin: 8.4 g/dL — ABNORMAL LOW (ref 13.0–18.0)
MCH: 25.8 pg — ABNORMAL LOW (ref 26.0–34.0)
MCHC: 31.3 g/dL — AB (ref 32.0–36.0)
MCV: 82.3 fL (ref 80.0–100.0)
Platelets: 259 10*3/uL (ref 150–440)
RBC: 3.26 MIL/uL — ABNORMAL LOW (ref 4.40–5.90)
RDW: 20.9 % — AB (ref 11.5–14.5)
WBC: 15.1 10*3/uL — ABNORMAL HIGH (ref 3.8–10.6)

## 2015-05-31 MED ORDER — EPOETIN ALFA 10000 UNIT/ML IJ SOLN
10000.0000 [IU] | Freq: Once | INTRAMUSCULAR | Status: AC
Start: 2015-05-31 — End: 2015-05-31
  Administered 2015-05-31: 10000 [IU] via INTRAVENOUS

## 2015-05-31 MED ORDER — NEPRO/CARBSTEADY PO LIQD
237.0000 mL | Freq: Every day | ORAL | Status: DC
  Administered 2015-05-31 – 2015-06-05 (×4): 237 mL via ORAL

## 2015-05-31 NOTE — Clinical Social Work Note (Addendum)
Clinical Social Work Assessment  Patient Details  Name: Sean BoutonHolt Lemme Jr. MRN: [id number] Date of Birth: 02-19-1934  Date of referral:  05/31/15               [facility]   [facility]                Permission sought to share information with:    Permission granted to share information::     Name::        Agency::     Relationship::     Contact Information:     Housing/Transportation Living arrangements for the past 2 months:  Skilled Building survey[facility]ursing Facility Source of Information:  Medical Team, Facility Patient Interpreter Needed:  None Criminal Activity/Legal Involvement Pertinent to Current Situation/Hospitalization:    Significant Relationships:  Siblings, Adult Children, Other Family Members Lives with:  Facility Resident Do you feel safe going back to the place where you live?    Need for family participation in patient care:     Care giving concerns:     Social Worker assessment / plan:  Visual merchandiserClinical Social Worker (CSW) consult, [facility] and [facility].   Patient was out of room and CSW unable to meet with patient will talk with patient when he returns.  ([facility]    [facility] and has lived there since September 2016 and facility ok for patient to return .   [name] support system is his sister Rivka BarbaraGlenda. Patient uses a wheelchair when at the facility, and has dialysis three days a week MWF on [address]. Patient will return to his room [address]A when ready for discharge and report [phone]     CSW will complete FL2,  in [facility].       Employment status:  Disabled (Comment on whether or not currently receiving Disability) Insurance information:  Managed Medicare PT Recommendations:  Not assessed at this time Information / Referral to community resources:     Patient/Family's Response to care:    Patient/Family's Understanding of  and Emotional Response to Diagnosis, Current Treatment, and Prognosis:  Patient is under continued medical work up at this time.  Emotional Assessment Appearance:    Attitude/Demeanor/Rapport:  Unable to Assess Affect (typically observed):  Unable to Assess Orientation:    Alcohol / Substance use:  Other (hx tobacco use) Psych involvement (Current and /or in the community):  No (Comment)  Discharge Needs  Concerns to be addressed:  Care Coordination, Discharge Planning Concerns Readmission within the last 30 days:  No Current discharge risk:  Chronically ill, Dependent with Mobility Barriers to Discharge:  Continued Medical Work up   Soundra PilonMoore, Kattia Selley H, LCSW 05/31/2015, 1:46 PM

## 2015-05-31 NOTE — Progress Notes (Signed)
Nutrition Follow-up    INTERVENTION:   Meals and Snacks: Cater to patient preferences Medical Food Supplement Therapy: recommend addition of Nepro daily  NUTRITION DIAGNOSIS:    Reassess on follow-up   GOAL:   Patient will meet greater than or equal to 90% of their needs  MONITOR:    (Energy Intake, Anthropometrics, Digestive System, Electrolyte/Renal Profile, Glucose Profile,)  REASON FOR ASSESSMENT:    (Renal Diet)    ASSESSMENT:   Pt admitted with sepsis with pneumonia, afib; ESRD on HD, pt down for HD on visit today   Past Medical History  Diagnosis Date  . Chronic renal failure, stage 4 (severe) (HCC)   . Hypertension   . CHF (congestive heart failure) (HCC)   . Gout   . Glaucoma   . Neuropathy (HCC)   . Stroke (HCC)   . Peripheral vascular disease (HCC)   . Coronary artery disease   . A-fib (HCC)   . Dysrhythmia   . Blindness of right eye   . Anemia     iron deficiency    Diet Order:  Diet renal with fluid restriction Fluid restriction:: 1200 mL Fluid; Room service appropriate?: Yes; Fluid consistency:: Thin   Energy Intake: no recorded po intake  Food and Nutrition Related History: unable to assess, not not in room  Skin:   (stage II buttock)  Electrolyte and Renal Profile:  Recent Labs Lab 05/29/15 1142 05/30/15 0911 05/31/15 0436  BUN 42* 51* 59*  CREATININE 3.62* 4.48* 4.53*  NA 142 144 145  K 3.6  3.7 3.9 3.8   Glucose Profile: No results for input(s): GLUCAP in the last 72 hours.   Nutrition Focused Physical Exam:  Unable to complete Nutrition-Focused physical exam at this time.    Height:   Ht Readings from Last 1 Encounters:  05/30/15 6\' 1"  (1.854 m)    Weight: no true weight trend per wt encounters  Wt Readings from Last 1 Encounters:  05/31/15 224 lb 13.9 oz (102 kg)    Wt Readings from Last 10 Encounters:  05/31/15 224 lb 13.9 oz (102 kg)  05/18/15 259 lb (117.482 kg)  04/17/15 240 lb 4.8 oz (109 kg)  03/30/15  257 lb (116.574 kg)  03/11/15 239 lb 12.8 oz (108.773 kg)    BMI:  Body mass index is 29.67 kg/(m^2).  Estimated Nutritional Needs:   Kcal:  2109-2335 kcals (BEE 1622, 1.3 AF, 1.0-1.2 IF)   Protein:  122-143 g (1.2-1.4 g/kg)   Fluid:  1000 mL plus UOp  MODERATE Care Level  [facility] Karma Ansley MS, RD, LDN [phone] Pager

## 2015-05-31 NOTE — Progress Notes (Signed)
Post hd tx 

## 2015-05-31 NOTE — NC FL2 (Signed)
MEDICAID FL2 LEVEL OF CARE SCREENING TOOL     IDENTIFICATION  Patient Name: Sean Delgado. Birthdate: Apr 20, 1934 Sex: male Admission Date (Current Location): 05/30/2015  [place] and [place] Number:  Air cabin crew )   Facility and Address:  [facility], [address] [place], [place] [address]      Provider Number: [id number]  Attending Physician Name and Address:  Delfino Lovett, MD  Relative Name and Phone Number:       Current Level of Care: Hospital Recommended Level of Care: Skilled Nursing Facility Prior Approval Number:    Date Approved/Denied:   PASRR Number:    Discharge Plan: SNF    Current Diagnoses: Patient Active Problem List   Diagnosis Date Noted  . Sepsis (HCC) 05/30/2015  . Hypoxia 05/30/2015  . Acute respiratory failure (HCC) 04/08/2015  . CHF (congestive heart failure) (HCC) 03/04/2015    Orientation ACTIVITIES/SOCIAL BLADDER RESPIRATION    Self, Time, Situation, Place      O2 (As needed)  BEHAVIORAL SYMPTOMS/MOOD NEUROLOGICAL BOWEL NUTRITION STATUS      Continent Diet  PHYSICIAN VISITS COMMUNICATION OF NEEDS Height & Weight Skin    Verbally  (185.4 cm) 228 lbs. PU Stage and Appropriate Care (Buttock)          AMBULATORY STATUS RESPIRATION    Assist extensive O2 (As needed)      Personal Care Assistance Level of Assistance  Bathing, Feeding, Dressing Bathing Assistance: Maximum assistance Feeding assistance: Independent Dressing Assistance: Maximum assistance      Functional Limitations Info  Sight, Hearing, Speech Sight Info: Impaired Hearing Info: Adequate Speech Info: Adequate       SPECIAL CARE FACTORS FREQUENCY                      Additional Factors Info  Allergies   Allergies Info:  (NKA)           Current Medications (05/31/2015): Current Facility-Administered Medications  Medication Dose Route Frequency Provider Last Rate Last Dose  . acetaminophen (TYLENOL)  tablet 650 mg  650 mg Oral Q6H PRN Shaune Pollack, MD       Or  . acetaminophen (TYLENOL) suppository 650 mg  650 mg Rectal Q6H PRN Shaune Pollack, MD      . albuterol (PROVENTIL) (2.5 MG/3ML) 0.083% nebulizer solution 2.5 mg  2.5 mg Nebulization Q2H PRN Shaune Pollack, MD      . allopurinol (ZYLOPRIM) tablet 100 mg  100 mg Oral Daily Shaune Pollack, MD   100 mg at 05/31/15 0957  . aspirin EC tablet 81 mg  81 mg Oral Daily Shaune Pollack, MD   81 mg at 05/31/15 0957  . atorvastatin (LIPITOR) tablet 20 mg  20 mg Oral Daily Shaune Pollack, MD   20 mg at 05/31/15 0957  . brimonidine (ALPHAGAN) 0.2 % ophthalmic solution 1 drop  1 drop Left Eye BID Shaune Pollack, MD   1 drop at 05/31/15 0954  . clopidogrel (PLAVIX) tablet 75 mg  75 mg Oral Daily Shaune Pollack, MD   75 mg at 05/31/15 0955  . diltiazem (CARDIZEM CD) 24 hr capsule 180 mg  180 mg Oral Daily Shaune Pollack, MD   180 mg at 05/31/15 0957  . epoetin alfa (EPOGEN,PROCRIT) injection 10,000 Units  10,000 Units Intravenous Once Mosetta Pigeon, MD      . ferrous sulfate EC tablet 325 mg  325 mg Oral Daily Shaune Pollack, MD   325 mg at 05/31/15 0955  .  furosemide (LASIX) tablet 40 mg  40 mg Oral BID Shaune PollackQing Chen, MD   40 mg at 05/31/15 0957  . heparin injection 5,000 Units  5,000 Units Subcutaneous 3 times per day Shaune PollackQing Chen, MD   5,000 Units at 05/31/15 0509  . HYDROcodone-acetaminophen (NORCO/VICODIN) 5-325 MG per tablet 1 tablet  1 tablet Oral Q6H PRN Shaune PollackQing Chen, MD      . isosorbide mononitrate (IMDUR) 24 hr tablet 60 mg  60 mg Oral Daily Shaune PollackQing Chen, MD   60 mg at 05/31/15 0955  . latanoprost (XALATAN) 0.005 % ophthalmic solution 1 drop  1 drop Both Eyes QHS Shaune PollackQing Chen, MD   1 drop at 05/30/15 2201  . metoprolol succinate (TOPROL-XL) 24 hr tablet 200 mg  200 mg Oral Daily Shaune PollackQing Chen, MD   200 mg at 05/31/15 0956  . mupirocin ointment (BACTROBAN) 2 % 1 application  1 application Nasal BID Shaune PollackQing Chen, MD   1 application at 05/31/15 [id number]  . ondansetron (ZOFRAN) tablet 4 mg  4 mg Oral Q6H PRN Shaune PollackQing Chen,  MD       Or  . ondansetron [facility](ZOFRAN) injection 4 mg  4 mg Intravenous Q6H PRN Shaune PollackQing Chen, MD      . piperacillin-tazobactam (ZOSYN) IVPB 3.375 g  3.375 g Intravenous Q12H Crystal Hedwig MortonG Scarpena, RPH   3.375 g at 05/31/15 0954  . pregabalin (LYRICA) capsule 75 mg  75 mg Oral Daily Shaune PollackQing Chen, MD   75 mg at 05/31/15 0957  . senna-docusate (Senokot-S) tablet 2 tablet  2 tablet Oral QHS Shaune PollackQing Chen, MD   2 tablet at 05/30/15 2159  . sodium bicarbonate tablet 650 mg  650 mg Oral BID Shaune PollackQing Chen, MD   650 mg at 05/31/15 0956  . [START ON 06/01/2015] vancomycin (VANCOCIN) IVPB 1000 mg/200 mL premix  1,000 mg Intravenous Q T,Th,Sa-HD Crystal G Scarpena, RPH       Do not use this list as official medication orders. Please verify with discharge summary.  Discharge Medications:   Medication List    ASK your doctor about these medications        acetaminophen 325 MG tablet  Commonly known as:  TYLENOL  Take 650 mg by mouth every 6 (six) hours as needed. For pain.     allopurinol 100 MG tablet  Commonly known as:  ZYLOPRIM  Take 100 mg by mouth daily.     amoxicillin-clavulanate 500-125 MG tablet  Commonly known as:  AUGMENTIN  Take 1 tablet (500 mg total) by mouth daily.     aspirin EC 81 MG tablet  Take 81 mg by mouth daily.     atorvastatin 20 MG tablet  Commonly known as:  LIPITOR  Take 1 tablet (20 mg total) by mouth daily.     b complex-vitamin c-folic acid 0.8 MG Tabs tablet  Take 1 tablet by mouth daily.     brimonidine 0.2 % ophthalmic solution  Commonly known as:  ALPHAGAN  Place 1 drop into the left eye 2 (two) times daily.     clopidogrel 75 MG tablet  Commonly known as:  PLAVIX  Take 75 mg by mouth daily.     diltiazem 180 MG 24 hr capsule  Commonly known as:  CARDIZEM CD  Take 1 capsule (180 mg total) by mouth daily.     ferrous sulfate 325 (65 FE) MG EC tablet  Take 325 mg by mouth daily.     furosemide 40 MG tablet  Commonly known as:  LASIX  Take 1 tablet (40 mg  total) by mouth 2 (two) times daily.     HYDROcodone-acetaminophen 5-325 MG tablet  Commonly known as:  NORCO/VICODIN  Take 1 tablet by mouth every 6 (six) hours as needed for moderate pain.     isosorbide mononitrate 60 MG 24 hr tablet  Commonly known as:  IMDUR  Take 60 mg by mouth daily.     latanoprost 0.005 % ophthalmic solution  Commonly known as:  XALATAN  Place 1 drop into both eyes at bedtime.     lidocaine 5 %  Commonly known as:  LIDODERM  Place 1 patch onto the skin daily. Remove & Discard patch within 12 hours or as directed by MD     metoprolol 200 MG 24 hr tablet  Commonly known as:  TOPROL-XL  Take 200 mg by mouth daily.     miconazole 2 % cream  Commonly known as:  MICOTIN  Apply 1 application topically daily. Clean and apply to groin     pregabalin 75 MG capsule  Commonly known as:  LYRICA  Take 75 mg by mouth daily.     senna-docusate 8.6-50 MG tablet  Commonly known as:  Senokot-S  Take 2 tablets by mouth at bedtime.     sodium bicarbonate 650 MG tablet  Take 650 mg by mouth 2 (two) times daily.     tolnaftate 1 % spray  Commonly known as:  TINACTIN  Apply 1 spray topically daily. To feet.     vitamin A & D ointment  Apply 1 application topically daily. To feet.     warfarin 5 MG tablet  Commonly known as:  COUMADIN  Take 1 tablet (5 mg total) by mouth daily at 6 PM.        Relevant Imaging Results:  Relevant Lab Results:  Recent Labs    Additional Information  (ZO:[id number]     Dialysis MWF  )  Soundra Pilon, LCSW

## 2015-05-31 NOTE — Evaluation (Signed)
Clinical/Bedside Swallow Evaluation Patient Details  Name: Sean BoutonHolt Keir Jr. MRN: [id number] Date of Birth: 06/03/1934  Today's Date: 05/31/2015 Time: SLP Start Time (ACUTE ONLY): 1610 SLP Stop Time (ACUTE ONLY): 1635 SLP Time Calculation (min) (ACUTE ONLY): 25 min  Past Medical History:  Past Medical History  Diagnosis Date  . Chronic renal failure, stage 4 (severe) (HCC)   . Hypertension   . CHF (congestive heart failure) (HCC)   . Gout   . Glaucoma   . Neuropathy (HCC)   . Stroke (HCC)   . Peripheral vascular disease (HCC)   . Coronary artery disease   . A-fib (HCC)   . Dysrhythmia   . Blindness of right eye   . Anemia     iron deficiency   Past Surgical History:  Past Surgical History  Procedure Laterality Date  . Vascular surgery Bilateral     Carotid Endarterectomy  . Eye surgery Left     Cataract Extraction  . Av fistula placement Left 04/06/2015    Procedure: ARTERIOVENOUS (AV) FISTULA CREATION;  Surgeon: Annice NeedyJason S Dew, MD;  Location: [facility] ORS;  Service: Vascular;  Laterality: Left;  . Peripheral vascular catheterization N/A 04/12/2015    Procedure: Dialysis/Perma Catheter Insertion;  Surgeon: Annice NeedyJason S Dew, MD;  Location: [facility] INVASIVE CV LAB;  Service: Cardiovascular;  Laterality: N/A;  . Peripheral vascular catheterization N/A 05/18/2015    Procedure: Dialysis/Perma Catheter Insertion;  Surgeon: Annice NeedyJason S Dew, MD;  Location: [facility] INVASIVE CV LAB;  Service: Cardiovascular;  Laterality: N/A;  . Peripheral vascular catheterization Left 05/29/2015    Procedure: A/V Shuntogram/Fistulagram;  Surgeon: Annice NeedyJason S Dew, MD;  Location: [facility] INVASIVE CV LAB;  Service: Cardiovascular;  Laterality: Left;  . Peripheral vascular catheterization N/A 05/29/2015    Procedure: A/V Shunt Intervention;  Surgeon: Annice NeedyJason S Dew, MD;  Location: [facility] INVASIVE CV LAB;  Service: Cardiovascular;  Laterality: N/A;   HPI:      Assessment / Plan / Recommendation Clinical Impression  pt presents with a  mild to mod pharyngeal dysphagia charactorized by throat clear and cough immediate with sips of this liquid utilizing straw and utilizing cup. pt with no overt ssx aspriaiton with trials of regular or nectar thick . Nursing and md notified of diet change.     Aspiration Risk  Moderate aspiration risk    Diet Recommendation Regular;Nectar-thick liquid   Liquid Administration via: Cup Medication Administration: Whole meds with puree Supervision: Patient able to self feed Postural Changes: Seated upright at 90 degrees    Other  Recommendations Oral Care Recommendations: Oral care BID Other Recommendations: Remove water pitcher   Follow up Recommendations       Frequency and Duration min 3x week  1 week       Prognosis Prognosis for Safe Diet Advancement: Good      Swallow Study   General Date of Onset: 05/31/15 Type of Study: Bedside Swallow Evaluation Diet Prior to this Study: Regular;Thin liquids Temperature Spikes Noted: N/A Respiratory Status: Nasal cannula History of Recent Intubation: No Behavior/Cognition: Alert;Cooperative;Pleasant mood Oral Cavity Assessment: Within Functional Limits Oral Care Completed by SLP: No Oral Cavity - Dentition: Adequate natural dentition Vision: Functional for self-feeding Self-Feeding Abilities: Able to feed self Patient Positioning: Upright in bed Baseline Vocal Quality: Normal Volitional Cough: Strong Volitional Swallow: Able to elicit    Oral/Motor/Sensory Function Overall Oral Motor/Sensory Function: Within functional limits   Ice Chips Ice chips: Within functional limits   Thin Liquid Thin Liquid: Impaired Pharyngeal  Phase Impairments:  Cough - Immediate;Throat Clearing - Immediate    Nectar Thick Nectar Thick Liquid: Within functional limits   Honey Thick Honey Thick Liquid: Within functional limits   Puree Puree: Within functional limits   Solid Solid: Within functional limits       Meredith Pel  Sauber 05/31/2015,4:35 PM

## 2015-05-31 NOTE — Progress Notes (Signed)
HD tx completed.

## 2015-05-31 NOTE — Progress Notes (Signed)
Patient alert and oriented x3, but forgetful, no complaints at this time. vss at this time. Patient afib on telemetry. Will continue to assess. Sean KusterBrandi R Mansfield

## 2015-05-31 NOTE — Progress Notes (Signed)
Patient has rested quietly tonight. No complaints of pain and no signs of discomfort or distress noted. Nursing staff will continue to monitor. Rudene Poulsen A Sheba Whaling, RN 

## 2015-05-31 NOTE — Progress Notes (Signed)
Pre-hd tx 

## 2015-05-31 NOTE — Progress Notes (Signed)
[facility] - [place] at [facility]   PATIENT NAME: Sean Delgado    MR#:  [id number]  DATE OF BIRTH:  [birth year]  SUBJECTIVE:  CHIEF COMPLAINT:   Chief Complaint  Patient presents with  . Atrial Fibrillation  . Fever  . Fatigue   seen at dialysis, feels better, dialysis could not be done yesterday because of hemodynamic instability and tachycardia  REVIEW OF SYSTEMS:  Review of Systems  Constitutional: Negative for fever, weight loss, malaise/fatigue and diaphoresis.  HENT: Negative for ear discharge, ear pain, hearing loss, nosebleeds, sore throat and tinnitus.   Eyes: Negative for blurred vision and pain.  Respiratory: Positive for shortness of breath. Negative for cough, hemoptysis and wheezing.   Cardiovascular: Negative for chest pain, palpitations, orthopnea and leg swelling.  Gastrointestinal: Negative for heartburn, nausea, vomiting, abdominal pain, diarrhea, constipation and blood in stool.  Genitourinary: Negative for dysuria, urgency and frequency.  Musculoskeletal: Negative for myalgias and back pain.  Skin: Negative for itching and rash.  Neurological: Positive for weakness. Negative for dizziness, tingling, tremors, focal weakness, seizures and headaches.  Psychiatric/Behavioral: Negative for depression. The patient is not nervous/anxious.     DRUG ALLERGIES:  No Known Allergies VITALS:  Blood pressure 119/71, pulse 107, temperature 98 F (36.7 C), temperature source Oral, resp. rate 18, height  (1.854 m), weight 101.152 kg (223 lb), SpO2 91 %. PHYSICAL EXAMINATION:  Physical Exam  Constitutional: He is oriented to person, place, and time and well-developed, well-nourished, and in no distress.  HENT:  Head: Normocephalic and atraumatic.  Eyes: Conjunctivae and EOM are normal. Pupils are equal, round, and reactive to light.  Neck: Normal range of motion. Neck supple. No tracheal deviation present. No thyromegaly present.   Cardiovascular: Normal rate, regular rhythm and normal heart sounds.   Pulmonary/Chest: Effort normal and breath sounds normal. No respiratory distress. He has no wheezes. He exhibits no tenderness.  Abdominal: Soft. Bowel sounds are normal. He exhibits no distension. There is no tenderness.  Musculoskeletal: Normal range of motion.  Neurological: He is alert and oriented to person, place, and time. No cranial nerve deficit.  Skin: Skin is warm and dry. No rash noted.  Psychiatric: Mood and affect normal.   LABORATORY PANEL:   CBC  Recent Labs Lab 05/31/15 0436  WBC 15.1*  HGB 8.4*  HCT 26.8*  PLT 259   ------------------------------------------------------------------------------------------------------------------ Chemistries   Recent Labs Lab 05/30/15 0911 05/31/15 0436  NA 144 145  K 3.9 3.8  CL 102 104  CO2 30 30  GLUCOSE 163* 108*  BUN 51* 59*  CREATININE 4.48* 4.53*  CALCIUM 8.6* 8.2*  AST 27  --   ALT 18  --   ALKPHOS 99  --   BILITOT 1.1  --    RADIOLOGY:  No results found. ASSESSMENT AND PLAN:   * Sepsis: Present on admission likely due to pneumonia, continue vancomycin, Zosyn, Levaquin.  We will consult infectious disease.  Await sputum and blood culture  * Left lower lobe pneumonia (HAP): continue vancomycin, Zosyn, Levaquin.  We will consult infectious disease.  Await sputum and blood culture  * Hypoxia: Due to pneumonia  * A. fib with RVR: cardiology consult. Continue Cardizem and Lopressor by mouth.  He is at high risk for fall and is not a good candidate for anticoagulation  * ESRD on hemodialysis: Dialysis per nephrology  * AV fistula malfunction: occluded left arm cephalic vein portion of radiocephalic AV fistula unable to be recanalized   *  Hypertension: Controlled on current medications    All the records are reviewed and case discussed with Care Management/Social Worker. Management plans discussed with the patient, family and they  are in agreement.  CODE STATUS: Full code  TOTAL TIME TAKING CARE OF THIS PATIENT: 35 minutes.   More than 50% of the time was spent in counseling/coordination of care: YES  POSSIBLE D/C IN 1-2 DAYS, DEPENDING ON CLINICAL CONDITION.   [facility] Hospital[facility], Song Garris M.D on 05/31/2015 at 4:54 PM  Between 7am to 6pm - Pager - [phone]  After 6pm go to [email or web address] - password [email or web address] [facility]  [facility]  Office  [phone]  CC:  Primary care physician; Bobbye MortonSHARON A REILLY, MD

## 2015-05-31 NOTE — Progress Notes (Signed)
Subjective:  Patient is a 81 year old male with history of ESRD, hypertension,  He is a resident of [facility] nursing home.  He was sent over for decreased oxygen saturation, fever, cough and generalized weakness. Day prior to admission, He underwent left arm radiocephalic AV fistula angiogram but because of occluded left arm cephalic vein, it was unable to be be cannulized and could not be salvaged He was diagnosed with left lower lobe pneumonia. He is admitted for further evaluation and management Dialysis could not be done yesterday because of hemodynamic instability and tachycardia with heart rate of 110-140   Objective:  Vital signs in last 24 hours:  Temp:  [98 F (36.7 C)-99.5 F (37.5 C)] 98.8 F (37.1 C) (11/23 1150) Pulse Rate:  [35-132] 108 (11/23 1230) Resp:  [18-25] 21 (11/23 1230) BP: (99-137)/(56-68) 114/65 mmHg (11/23 1230) SpO2:  [74 %-95 %] 95 % (11/23 1230) Weight:  [100.971 kg (222 lb 9.6 oz)-102 kg (224 lb 13.9 oz)] 102 kg (224 lb 13.9 oz) (11/23 1150)  Weight change:  Filed Weights   05/30/15 0913 05/30/15 1344 05/31/15 1150  Weight: 103.42 kg (228 lb) 100.971 kg (222 lb 9.6 oz) 102 kg (224 lb 13.9 oz)    Intake/Output:    Intake/Output Summary (Last 24 hours) at 05/31/15 1250 Last data filed at 05/31/15 0037  Gross per 24 hour  Intake    250 ml  Output      0 ml  Net    250 ml    HEMODIALYSIS FLOW SHEET:11/23  Blood Flow Rate (mL/min): 400 mL/min Arterial Pressure (mmHg): -180 mmHg Venous Pressure (mmHg): 150 mmHg Transmembrane Pressure (mmHg): 40 mmHg Ultrafiltration Rate (mL/min): 290 mL/min Dialysate Flow Rate (mL/min): 800 ml/min Conductivity: Machine : 14.1 Conductivity: Machine : 14.1 Dialysis Fluid Bolus: Normal Saline Bolus Amount (mL): 250 mL Intra-Hemodialysis Comments: .Marland KitchenMarland KitchenResting with eyes closed.   Physical Exam: General: NAD, laying in bed  HEENT Moist oral mucus membranes, decreased hearing  Neck supple   Pulm/lungs Shallow effort, clear ant/lat  CVS/Heart Irregular, tachycardic  Abdomen:  Soft, mild distention  Extremities: No edema  Neurologic: Alert, able to communicate normally  Skin: No acute rashes  Access:        Basic Metabolic Panel:   Recent Labs Lab 05/29/15 1142 05/30/15 0911 05/31/15 0436  NA 142 144 145  K 3.6  3.7 3.9 3.8  CL 100* 102 104  CO2 GLUCOSE 166* 163* 108*  BUN 42* 51* 59*  CREATININE 3.62* 4.48* 4.53*  CALCIUM 8.7* 8.6* 8.2*     CBC:  Recent Labs Lab 05/29/15 1142 05/30/15 0911 05/31/15 0436  WBC 10.2 16.3* 15.1*  NEUTROABS  --  14.6*  --   HGB 9.9* 9.9* 8.4*  HCT 32.0* 32.4* 26.8*  MCV 82.8 81.7 82.3  PLT 335 319 259      Microbiology:  Recent Results (from the past 720 hour(s))  Blood Culture (routine x 2)     Status: None (Preliminary result)   Collection Time: 05/30/15  9:11 AM  Result Value Ref Range Status   Specimen Description BLOOD LEFT ASSIST CONTROL  Final   Special Requests   Final    BOTTLES DRAWN AEROBIC AND ANAEROBIC  2CC AERO 1CC ANAERO   Culture NO GROWTH < 24 HOURS  Final   Report Status PENDING  Incomplete  Blood Culture (routine x 2)     Status: None (Preliminary result)   Collection Time: 05/30/15  9:11 AM  Result  Value Ref Range Status   Specimen Description BLOOD LEFT FATTY CASTS  Final   Special Requests   Final    BOTTLES DRAWN AEROBIC AND ANAEROBIC  1CC ANAERO 2CC AERO   Culture NO GROWTH < 24 HOURS  Final   Report Status PENDING  Incomplete  Urine culture     Status: None (Preliminary result)   Collection Time: 05/30/15  9:40 AM  Result Value Ref Range Status   Specimen Description URINE, RANDOM  Final   Special Requests NONE  Final   Culture NO GROWTH < 24 HOURS  Final   Report Status PENDING  Incomplete  MRSA PCR Screening     Status: Abnormal   Collection Time: 05/30/15  1:43 PM  Result Value Ref Range Status   MRSA by PCR POSITIVE (A) NEGATIVE Final    Comment:        The  GeneXpert MRSA Assay (FDA approved for NASAL specimens only), is one component of a comprehensive MRSA colonization surveillance program. It is not intended to diagnose MRSA infection nor to guide or monitor treatment for MRSA infections. CRITICAL RESULT CALLED TO, READ BACK BY AND VERIFIED WITH:  BRANDY MANSFIELD AT 1506 05/30/15 SDR     Coagulation Studies:  Recent Labs  05/29/15 1142 05/30/15 0911  LABPROT 15.4* 16.0*  INR 1.20 1.27    Urinalysis:  Recent Labs  05/30/15 0940  KETONESUR NEGATIVE      Imaging: Dg Chest Port 1 View  05/30/2015  CLINICAL DATA:  Fever and atrial fibrillation EXAM: PORTABLE CHEST 1 VIEW COMPARISON:  May 29, 2015 FINDINGS: Central catheter tip is at the cavoatrial junction. No pneumothorax. There is increase in airspace consolidation in the left lower lobe with left effusion. Right lung is clear. Heart is mildly enlarged with pulmonary vascular within normal limits. There is atherosclerotic calcification in the aorta. No adenopathy appreciable. IMPRESSION: Increase in left lower lobe consolidation with left effusion. Suspect pneumonia left lower lobe. Right lung clear. Heart prominent but stable. No pneumothorax. Electronically Signed   By: Bretta BangWilliam  Woodruff III M.D.   On: 05/30/2015 09:26     Medications:     . allopurinol  100 mg Oral Daily  . aspirin EC  81 mg Oral Daily  . atorvastatin  20 mg Oral Daily  . brimonidine  1 drop Left Eye BID  . clopidogrel  75 mg Oral Daily  . diltiazem  180 mg Oral Daily  . ferrous sulfate  325 mg Oral Daily  . furosemide  40 mg Oral BID  . heparin  5,000 Units Subcutaneous 3 times per day  . isosorbide mononitrate  60 mg Oral Daily  . latanoprost  1 drop Both Eyes QHS  . metoprolol  200 mg Oral Daily  . mupirocin ointment  1 application Nasal BID  . piperacillin-tazobactam (ZOSYN)  IV  3.375 g Intravenous Q12H  . pregabalin  75 mg Oral Daily  . senna-docusate  2 tablet Oral QHS  . sodium  bicarbonate  650 mg Oral BID  . [START ON 06/01/2015] vancomycin  1,000 mg Intravenous Q T,Th,Sa-HD     Assessment/ Plan:  81 y.o.African [place] male with right eye blindness, diastolic congestive heart failure, hypertension, gout, glaucoma, peripheral neuropathy, atrial fibrillation, anemia, carotid stenosis status post bilateral CEA, CVA, peripheral vascular disease who [facility] for shortness of breath.   [facility]  1. End Stage Renal Disease: patient started on hemodialysis 10/6. -  Hemodialysis today, then Friday and  Saturday due to holiday schedule  2. Anemia of chronic kidney disease: hgb 8.4, continue epogen with HD.   3. Secondary Hyperparathyroidism:  - monitor phosphorus while in the hospital  4.  Left lower lobe pneumonia - Antibiotics and management as per hospitalist    LOS: 1 Sean Delgado 11/23/201612:50 PM

## 2015-06-01 LAB — BASIC METABOLIC PANEL
ANION GAP: 9 (ref 5–15)
BUN: 30 mg/dL — ABNORMAL HIGH (ref 6–20)
CHLORIDE: 98 mmol/L — AB (ref 101–111)
CO2: 33 mmol/L — ABNORMAL HIGH (ref 22–32)
Calcium: 8 mg/dL — ABNORMAL LOW (ref 8.9–10.3)
Creatinine, Ser: 3.14 mg/dL — ABNORMAL HIGH (ref 0.61–1.24)
GFR calc non Af Amer: 17 mL/min — ABNORMAL LOW (ref >60)
GFR, EST AFRICAN AMERICAN: 20 mL/min — AB (ref >60)
Glucose, Bld: 145 mg/dL — ABNORMAL HIGH (ref 65–99)
POTASSIUM: 3.9 mmol/L (ref 3.5–5.1)
SODIUM: 140 mmol/L (ref 135–145)

## 2015-06-01 LAB — URINE CULTURE: Culture: NO GROWTH

## 2015-06-01 LAB — CBC
HCT: 26.9 % — ABNORMAL LOW (ref 40.0–52.0)
HEMOGLOBIN: 8.5 g/dL — AB (ref 13.0–18.0)
MCH: 25.7 pg — ABNORMAL LOW (ref 26.0–34.0)
MCHC: 31.5 g/dL — ABNORMAL LOW (ref 32.0–36.0)
MCV: 81.5 fL (ref 80.0–100.0)
Platelets: 245 10*3/uL (ref 150–440)
RBC: 3.3 MIL/uL — AB (ref 4.40–5.90)
RDW: 20.9 % — ABNORMAL HIGH (ref 11.5–14.5)
WBC: 12 10*3/uL — AB (ref 3.8–10.6)

## 2015-06-01 LAB — HEPATITIS B SURFACE ANTIGEN: HEP B S AG: NEGATIVE

## 2015-06-01 NOTE — Progress Notes (Signed)
[facility] [place] at [facility]   PATIENT NAME: Sean KussmaulHolt Delgado    MR#:  [id number]  DATE OF BIRTH:  10-Sep-1933  SUBJECTIVE:  Patient continues to have fevers and reports severe right hip pain  REVIEW OF SYSTEMS:  Review of Systems  Constitutional: Positive for fever. Negative for weight loss, malaise/fatigue and diaphoresis.  HENT: Negative for ear discharge, ear pain, hearing loss, nosebleeds, sore throat and tinnitus.   Eyes: Negative for blurred vision and pain.  Respiratory: Positive for cough. Negative for hemoptysis, shortness of breath and wheezing.   Cardiovascular: Negative for chest pain, palpitations, orthopnea and leg swelling.  Gastrointestinal: Negative for heartburn, nausea, vomiting, abdominal pain, diarrhea, constipation and blood in stool.  Genitourinary: Negative for dysuria, urgency and frequency.  Musculoskeletal: Positive for joint pain. Negative for myalgias and back pain.  Skin: Negative for itching and rash.  Neurological: Positive for weakness. Negative for dizziness, tingling, tremors, focal weakness, seizures and headaches.  Psychiatric/Behavioral: Negative for depression. The patient is not nervous/anxious.     DRUG ALLERGIES:  No Known Allergies VITALS:  Blood pressure 110/55, pulse 112, temperature 99.6 F (37.6 C), temperature source Oral, resp. rate 18, height 6\' 1"  (1.854 m), weight 101.152 kg (223 lb), SpO2 94 %. PHYSICAL EXAMINATION:  Physical Exam  Constitutional: He is oriented to person, place, and time and well-developed, well-nourished, and in no distress.  HENT:  Head: Normocephalic and atraumatic.  Eyes: Conjunctivae and EOM are normal. Pupils are equal, round, and reactive to light.  Neck: Normal range of motion. Neck supple. No tracheal deviation present. No thyromegaly present.  Cardiovascular: Normal rate, regular rhythm and normal heart sounds.   Pulmonary/Chest: Effort normal and breath sounds normal. No  respiratory distress. He has no wheezes. He exhibits no tenderness.  Abdominal: Soft. Bowel sounds are normal. He exhibits no distension. There is no tenderness.  Musculoskeletal: Normal range of motion.  Right hip pain and weakness no obvious swelling   Neurological: He is alert and oriented to person, place, and time. No cranial nerve deficit.  Skin: Skin is warm and dry. No rash noted.  Psychiatric: Mood and affect normal.   LABORATORY PANEL:   CBC  Recent Labs Lab 06/01/15 0714  WBC 12.0*  HGB 8.5*  HCT 26.9*  PLT 245   ------------------------------------------------------------------------------------------------------------------ Chemistries   Recent Labs Lab 05/30/15 0911  06/01/15 0714  NA 144  < > 140  K 3.9  < > 3.9  CL 102  < > 98*  CO2 30  < > 33*  GLUCOSE 163*  < > 145*  BUN 51*  < > 30*  CREATININE 4.48*  < > 3.14*  CALCIUM 8.6*  < > 8.0*  AST 27  --   --   ALT 18  --   --   ALKPHOS 99  --   --   BILITOT 1.1  --   --   < > = values in this interval not displayed. RADIOLOGY:  No results found. ASSESSMENT AND PLAN:   * Sepsis: Present on admission likely due to pneumonia, patient is currently on Zosyn and vancomycin which I will continue for now. I am concerned of patient's ongoing fevers despite these antibiotics. He is complaining of right hip pain and therefore I will order MRI to evaluate for an abscess. He also has a dialysis catheter which could be the etiology of ongoing fevers. Infectious disease consultation has already been placed. * Left lower lobe pneumonia (HAP): continue  vancomycin, Zosyn,  We will consult infectious disease.  Blood cultures are negative to date * Hypoxia: Due to pneumonia  * A. fib with RVR: cardiology consult. Continue Cardizem and Lopressor by mouth.  He is at high risk for fall and is not a good candidate for anticoagulation  * ESRD on hemodialysis: Dialysis per nephrology  * AV fistula malfunction: occluded left arm  cephalic vein portion of radiocephalic AV fistula unable to be recanalized   * Hypertension: Controlled on Imdur.  . Management plans discussed with the patient and he is in agreement CODE STATUS: Full code  TOTAL TIME TAKING CARE OF THIS PATIENT: 25 minutes.   More than 50% of the time was spent in counseling/coordination of care: YES  POSSIBLE D/C IN 2-3 DAYS, DEPENDING ON CLINICAL CONDITION.   Alexas Basulto M.D on 06/01/2015 at 4:00 PM  Between 7am to 6pm - Pager - [phone]  After 6pm go to [email or web address] - password [email or web address] [facility]  [place] [facility]  Office  [phone]  CC:  Primary care physician; Bobbye Morton, MD

## 2015-06-01 NOTE — Plan of Care (Signed)
Problem: Education: Goal: Knowledge of [facility] Education information/materials will improve Outcome: Progressing Some dementia  Problem: Safety: Goal: Ability to remain free from injury will improve Outcome: Progressing Fall precautions in place  Problem: Pain Managment: Goal: General experience of comfort will improve Outcome: Progressing Prn pain meds     Problem: Physical Regulation: Goal: Will remain free from infection Outcome: Progressing Iv antibiotics

## 2015-06-01 NOTE — Progress Notes (Signed)
Dr. Karlene LinemanV Shah made aware of temp 101.2, no new orders recieved

## 2015-06-01 NOTE — Progress Notes (Signed)
Temp down to 99.6

## 2015-06-01 NOTE — Progress Notes (Signed)
Subjective:  Patient completed dialysis yesterday. Still appears to be a bit confused. Still tachycardic with most recent pulse of 114.   Objective:  Vital signs in last 24 hours:  Temp:  [97.7 F (36.5 C)-98.9 F (37.2 C)] 97.7 F (36.5 C) (11/24 0740) Pulse Rate:  [68-123] 114 (11/24 0740) Resp:  [17-25] 17 (11/24 0740) BP: (99-130)/(56-75) 128/57 mmHg (11/24 0740) SpO2:  [91 %-98 %] 98 % (11/24 0740) Weight:  [101.152 kg (223 lb)-102 kg (224 lb 13.9 oz)] 101.152 kg (223 lb) (11/23 1617)  Weight change: -1.42 kg (-3 lb 2.1 oz) Filed Weights   05/31/15 1150 05/31/15 1541 05/31/15 1617  Weight: 102 kg (224 lb 13.9 oz) 101.6 kg (223 lb 15.8 oz) 101.152 kg (223 lb)    Intake/Output:    Intake/Output Summary (Last 24 hours) at 06/01/15 1014 Last data filed at 06/01/15 0900  Gross per 24 hour  Intake    250 ml  Output    500 ml  Net   -250 ml    HEMODIALYSIS FLOW SHEET: 11/23  Blood Flow Rate (mL/min): 400 mL/min Arterial Pressure (mmHg): -220 mmHg Venous Pressure (mmHg): 150 mmHg Transmembrane Pressure (mmHg): 40 mmHg Ultrafiltration Rate (mL/min): 290 mL/min Dialysate Flow Rate (mL/min): 800 ml/min Conductivity: Machine : 14 Conductivity: Machine : 14 Dialysis Fluid Bolus: Normal Saline Bolus Amount (mL): 250 mL Intra-Hemodialysis Comments: .Marland KitchenMarland KitchenTx completed.   Physical Exam: General: NAD, laying in bed  HEENT Moist oral mucus membranes, decreased hearing  Neck supple  Pulm/lungs Shallow effort, clear ant/lat  CVS/Heart Irregular, tachycardic  Abdomen:  Soft, mild distention  Extremities: No edema  Neurologic: Alert, but confused, follows commands  Skin: No acute rashes  Access: R IJ dialysis catheter       Basic Metabolic Panel:   Recent Labs Lab 05/29/15 1142 05/30/15 0911 05/31/15 0436 06/01/15 0714  NA 142 144 145 140  K 3.6  3.7 3.9 3.8 3.9  CL 100* 102 104 98*  CO2 33*  GLUCOSE 166* 163* 108* 145*  BUN 42* 51* 59* 30*   CREATININE 3.62* 4.48* 4.53* 3.14*  CALCIUM 8.7* 8.6* 8.2* 8.0*     CBC:  Recent Labs Lab 05/29/15 1142 05/30/15 0911 05/31/15 0436 06/01/15 0714  WBC 10.2 16.3* 15.1* 12.0*  NEUTROABS  --  14.6*  --   --   HGB 9.9* 9.9* 8.4* 8.5*  HCT 32.0* 32.4* 26.8* 26.9*  MCV 82.8 81.7 82.3 81.5  PLT 335 319 259 245      Microbiology:  Recent Results (from the past 720 hour(s))  Blood Culture (routine x 2)     Status: None (Preliminary result)   Collection Time: 05/30/15  9:11 AM  Result Value Ref Range Status   Specimen Description BLOOD LEFT ASSIST CONTROL  Final   Special Requests   Final    BOTTLES DRAWN AEROBIC AND ANAEROBIC  2CC AERO 1CC ANAERO   Culture NO GROWTH < 24 HOURS  Final   Report Status PENDING  Incomplete  Blood Culture (routine x 2)     Status: None (Preliminary result)   Collection Time: 05/30/15  9:11 AM  Result Value Ref Range Status   Specimen Description BLOOD LEFT FATTY CASTS  Final   Special Requests   Final    BOTTLES DRAWN AEROBIC AND ANAEROBIC  1CC ANAERO 2CC AERO   Culture NO GROWTH < 24 HOURS  Final   Report Status PENDING  Incomplete  Urine culture     Status: None  Collection Time: 05/30/15  9:40 AM  Result Value Ref Range Status   Specimen Description URINE, RANDOM  Final   Special Requests NONE  Final   Culture NO GROWTH 2 DAYS  Final   Report Status 06/01/2015 FINAL  Final  MRSA PCR Screening     Status: Abnormal   Collection Time: 05/30/15  1:43 PM  Result Value Ref Range Status   MRSA by PCR POSITIVE (A) NEGATIVE Final    Comment:        The GeneXpert MRSA Assay (FDA approved for NASAL specimens only), is one component of a comprehensive MRSA colonization surveillance program. It is not intended to diagnose MRSA infection nor to guide or monitor treatment for MRSA infections. CRITICAL RESULT CALLED TO, READ BACK BY AND VERIFIED WITH:  BRANDY MANSFIELD AT 1506 05/30/15 SDR     Coagulation Studies:  Recent Labs   05/29/15 1142 05/30/15 0911  LABPROT 15.4* 16.0*  INR 1.20 1.27    Urinalysis:  Recent Labs  05/30/15 0940  KETONESUR NEGATIVE      Imaging: No results found.   Medications:     . allopurinol  100 mg Oral Daily  . aspirin EC  81 mg Oral Daily  . atorvastatin  20 mg Oral Daily  . brimonidine  1 drop Left Eye BID  . clopidogrel  75 mg Oral Daily  . diltiazem  180 mg Oral Daily  . feeding supplement (NEPRO CARB STEADY)  237 mL Oral Q1500  . ferrous sulfate  325 mg Oral Daily  . furosemide  40 mg Oral BID  . heparin  5,000 Units Subcutaneous 3 times per day  . isosorbide mononitrate  60 mg Oral Daily  . latanoprost  1 drop Both Eyes QHS  . metoprolol  200 mg Oral Daily  . mupirocin ointment  1 application Nasal BID  . piperacillin-tazobactam (ZOSYN)  IV  3.375 g Intravenous Q12H  . pregabalin  75 mg Oral Daily  . senna-docusate  2 tablet Oral QHS  . sodium bicarbonate  650 mg Oral BID  . vancomycin  1,000 mg Intravenous Q T,Th,Sa-HD     Assessment/ Plan:  81 y.o.African [place] male with right eye blindness, diastolic congestive heart failure, hypertension, gout, glaucoma, peripheral neuropathy, atrial fibrillation, anemia, carotid stenosis status post bilateral CEA, CVA, peripheral vascular disease who [facility] for shortness of breath.   [facility]/ TTS  1. End Stage Renal Disease: patient started on hemodialysis 10/6. - Patient had hemodialysis yesterday. No acute indication for dialysis today. We will plan for dialysis again on Saturday.  2. Anemia of chronic kidney disease: Continue Epogen 10,000 units IV with dialysis.   3. Secondary Hyperparathyroidism:  -Check phosphorus with next dialysis treatment.  4.  Left lower lobe bacterial pneumonia - Antibiotics and management as per hospitalist    LOS: 2 Sean Delgado 11/24/201610:14 AM

## 2015-06-01 NOTE — Progress Notes (Signed)
Tylenol 650mg  po given for temp 101.2

## 2015-06-02 ENCOUNTER — Inpatient Hospital Stay: Payer: Medicare (Managed Care)

## 2015-06-02 NOTE — Care Management (Signed)
[facility]  CSW following

## 2015-06-02 NOTE — Progress Notes (Signed)
[facility] [place] [facility]   PATIENT NAME: Sean KussmaulHolt Delgado    MR#:  [id number]  DATE OF BIRTH:  06-23-1934  SUBJECTIVE:  no fevers since y'day morning and hip pain somewhat better REVIEW OF SYSTEMS:  Review of Systems  Constitutional: Positive for fever. Negative for weight loss, malaise/fatigue and diaphoresis.  HENT: Negative for ear discharge, ear pain, hearing loss, nosebleeds, sore throat and tinnitus.   Eyes: Negative for blurred vision and pain.  Respiratory: Positive for cough. Negative for hemoptysis, shortness of breath and wheezing.   Cardiovascular: Negative for chest pain, palpitations, orthopnea and leg swelling.  Gastrointestinal: Negative for heartburn, nausea, vomiting, abdominal pain, diarrhea, constipation and blood in stool.  Genitourinary: Negative for dysuria, urgency and frequency.  Musculoskeletal: Positive for joint pain. Negative for myalgias and back pain.  Skin: Negative for itching and rash.  Neurological: Positive for weakness. Negative for dizziness, tingling, tremors, focal weakness, seizures and headaches.  Psychiatric/Behavioral: Negative for depression. The patient is not nervous/anxious.    DRUG ALLERGIES:  No Known Allergies VITALS:  Blood pressure 132/55, pulse 103, temperature 97.8 F (36.6 C), temperature source Oral, resp. rate 18, height 6\' 1"  (1.854 m), weight 101.152 kg (223 lb), SpO2 92 %. PHYSICAL EXAMINATION:  Physical Exam  Constitutional: He is oriented to person, place, and time and well-developed, well-nourished, and in no distress.  HENT:  Head: Normocephalic and atraumatic.  Eyes: Conjunctivae and EOM are normal. Pupils are equal, round, and reactive to light.  Neck: Normal range of motion. Neck supple. No tracheal deviation present. No thyromegaly present.  Cardiovascular: Normal rate, regular rhythm and normal heart sounds.   Pulmonary/Chest: Effort normal and breath sounds normal. No respiratory  distress. He has no wheezes. He exhibits no tenderness.  Abdominal: Soft. Bowel sounds are normal. He exhibits no distension. There is no tenderness.  Musculoskeletal: Normal range of motion.  Right hip pain and weakness no obvious swelling   Neurological: He is alert and oriented to person, place, and time. No cranial nerve deficit.  Skin: Skin is warm and dry. No rash noted.  Psychiatric: Mood and affect normal.   LABORATORY PANEL:   CBC  Recent Labs Lab 06/01/15 0714  WBC 12.0*  HGB 8.5*  HCT 26.9*  PLT 245   ------------------------------------------------------------------------------------------------------------------ Chemistries   Recent Labs Lab 05/30/15 0911  06/01/15 0714  NA 144  < > 140  K 3.9  < > 3.9  CL 102  < > 98*  CO2 30  < > 33*  GLUCOSE 163*  < > 145*  BUN 51*  < > 30*  CREATININE 4.48*  < > 3.14*  CALCIUM 8.6*  < > 8.0*  AST 27  --   --   ALT 18  --   --   ALKPHOS 99  --   --   BILITOT 1.1  --   --   < > = values in this interval not displayed. RADIOLOGY:  No results found. ASSESSMENT AND PLAN:   * Sepsis: Present on admission likely due to pneumonia, continue Zosyn and vancomycin which I will continue for now. I am concerned of patient's ongoing fevers despite these antibiotics. He is complaining of right hip pain and so MRI's ordered to evaluate for an abscess. He also has a dialysis catheter which could be the etiology of ongoing fevers. Infectious disease consultation has already been placed.  * Left lower lobe pneumonia (HAP): continue vancomycin, Zosyn,  Pending c/s infectious disease.  Blood cultures are negative to date  * Hypoxia: Due to pneumonia  * A. fib with RVR: cardiology consult. Continue Cardizem and Lopressor by mouth.  He is at high risk for fall and is not a good candidate for anticoagulation  * ESRD on hemodialysis: Dialysis per nephrology  * AV fistula malfunction: occluded left arm cephalic vein portion of  radiocephalic AV fistula unable to be recanalized   * Hypertension: Controlled on Imdur.  . Management plans discussed with the patient and he is in agreement  CODE STATUS: Full code  TOTAL TIME TAKING CARE OF THIS PATIENT: 25 minutes.   More than 50% of the time was spent in counseling/coordination of care: YES (d/w Chapman Moss @ [phone] in ED)  POSSIBLE D/C IN 1-2 DAYS, DEPENDING ON CLINICAL CONDITION.   [facility], Karolynn Infantino M.D on 06/02/2015 at 1:43 PM  Between 7am to 6pm - Pager - [phone]  After 6pm go to [email or web address] - password [email or web address] [facility]  [place] [facility]  Office  [phone]  CC: Primary care physician; Bobbye Morton, MD

## 2015-06-02 NOTE — Progress Notes (Signed)
Subjective:  Patient resting comfortably at the moment. Due for dialysis tomorrow. Had temperature of 101.2 yesterday.   Objective:  Vital signs in last 24 hours:  Temp:  [98.7 F (37.1 C)-101.2 F (38.4 C)] 98.7 F (37.1 C) (11/25 0758) Pulse Rate:  [100-123] 118 (11/25 0758) Resp:  [18-20] 20 (11/25 0455) BP: (110-146)/(47-59) 146/59 mmHg (11/25 0455) SpO2:  [94 %-99 %] 96 % (11/25 0758)  Weight change:  Filed Weights   05/31/15 1150 05/31/15 1541 05/31/15 1617  Weight: 102 kg (224 lb 13.9 oz) 101.6 kg (223 lb 15.8 oz) 101.152 kg (223 lb)    Intake/Output:    Intake/Output Summary (Last 24 hours) at 06/02/15 0946 Last data filed at 06/02/15 0750  Gross per 24 hour  Intake    420 ml  Output      0 ml  Net    420 ml    HEMODIALYSIS FLOW SHEET: 11/23  Blood Flow Rate (mL/min): 400 mL/min Arterial Pressure (mmHg): -220 mmHg Venous Pressure (mmHg): 150 mmHg Transmembrane Pressure (mmHg): 40 mmHg Ultrafiltration Rate (mL/min): 290 mL/min Dialysate Flow Rate (mL/min): 800 ml/min Conductivity: Machine : 14 Conductivity: Machine : 14 Dialysis Fluid Bolus: Normal Saline Bolus Amount (mL): 250 mL Intra-Hemodialysis Comments: .Marland KitchenMarland KitchenTx completed.   Physical Exam: General: NAD, laying in bed  HEENT Moist oral mucus membranes, decreased hearing  Neck supple  Pulm/lungs Basilar rales, normal effort  CVS/Heart Irregular, tachycardic  Abdomen:  Soft, NTND, BS present  Extremities: Trace b/l LE edema  Neurologic: Alert, but confused, follows commands  Skin: No acute rashes  Access: R IJ dialysis catheter       Basic Metabolic Panel:   Recent Labs Lab 05/29/15 1142 05/30/15 0911 05/31/15 0436 06/01/15 0714  NA 142 144 145 140  K 3.6  3.7 3.9 3.8 3.9  CL 100* 102 104 98*  CO2 33*  GLUCOSE 166* 163* 108* 145*  BUN 42* 51* 59* 30*  CREATININE 3.62* 4.48* 4.53* 3.14*  CALCIUM 8.7* 8.6* 8.2* 8.0*     CBC:  Recent Labs Lab 05/29/15 1142  05/30/15 0911 05/31/15 0436 06/01/15 0714  WBC 10.2 16.3* 15.1* 12.0*  NEUTROABS  --  14.6*  --   --   HGB 9.9* 9.9* 8.4* 8.5*  HCT 32.0* 32.4* 26.8* 26.9*  MCV 82.8 81.7 82.3 81.5  PLT 335 319 259 245      Microbiology:  Recent Results (from the past 720 hour(s))  Blood Culture (routine x 2)     Status: None (Preliminary result)   Collection Time: 05/30/15  9:11 AM  Result Value Ref Range Status   Specimen Description BLOOD LEFT ASSIST CONTROL  Final   Special Requests   Final    BOTTLES DRAWN AEROBIC AND ANAEROBIC  2CC AERO 1CC ANAERO   Culture NO GROWTH 3 DAYS  Final   Report Status PENDING  Incomplete  Blood Culture (routine x 2)     Status: None (Preliminary result)   Collection Time: 05/30/15  9:11 AM  Result Value Ref Range Status   Specimen Description BLOOD LEFT FATTY CASTS  Final   Special Requests   Final    BOTTLES DRAWN AEROBIC AND ANAEROBIC  1CC ANAERO 2CC AERO   Culture NO GROWTH 3 DAYS  Final   Report Status PENDING  Incomplete  Urine culture     Status: None   Collection Time: 05/30/15  9:40 AM  Result Value Ref Range Status   Specimen Description URINE, RANDOM  Final  Special Requests NONE  Final   Culture NO GROWTH 2 DAYS  Final   Report Status 06/01/2015 FINAL  Final  MRSA PCR Screening     Status: Abnormal   Collection Time: 05/30/15  1:43 PM  Result Value Ref Range Status   MRSA by PCR POSITIVE (A) NEGATIVE Final    Comment:        The GeneXpert MRSA Assay (FDA approved for NASAL specimens only), is one component of a comprehensive MRSA colonization surveillance program. It is not intended to diagnose MRSA infection nor to guide or monitor treatment for MRSA infections. CRITICAL RESULT CALLED TO, READ BACK BY AND VERIFIED WITH:  BRANDY MANSFIELD AT 1506 05/30/15 SDR     Coagulation Studies: No results for input(s): LABPROT, INR in the last 72 hours.  Urinalysis: No results for input(s): COLORURINE, LABSPEC, PHURINE, GLUCOSEU,  HGBUR, BILIRUBINUR, KETONESUR, PROTEINUR, UROBILINOGEN, NITRITE, LEUKOCYTESUR in the last 72 hours.  Invalid input(s): APPERANCEUR    Imaging: No results found.   Medications:     . allopurinol  100 mg Oral Daily  . aspirin EC  81 mg Oral Daily  . atorvastatin  20 mg Oral Daily  . brimonidine  1 drop Left Eye BID  . clopidogrel  75 mg Oral Daily  . diltiazem  180 mg Oral Daily  . feeding supplement (NEPRO CARB STEADY)  237 mL Oral Q1500  . ferrous sulfate  325 mg Oral Daily  . furosemide  40 mg Oral BID  . heparin  5,000 Units Subcutaneous 3 times per day  . isosorbide mononitrate  60 mg Oral Daily  . latanoprost  1 drop Both Eyes QHS  . metoprolol  200 mg Oral Daily  . mupirocin ointment  1 application Nasal BID  . piperacillin-tazobactam (ZOSYN)  IV  3.375 g Intravenous Q12H  . pregabalin  75 mg Oral Daily  . senna-docusate  2 tablet Oral QHS  . sodium bicarbonate  650 mg Oral BID  . vancomycin  1,000 mg Intravenous Q T,Th,Sa-HD     Assessment/ Plan:  81 y.o.African [place] male with right eye blindness, diastolic congestive heart failure, hypertension, gout, glaucoma, peripheral neuropathy, atrial fibrillation, anemia, carotid stenosis status post bilateral CEA, CVA, peripheral vascular disease who was [facility] for shortness of breath.   [facility]/ TTS  1. End Stage Renal Disease: patient started on hemodialysis 10/6. - Patient due for dialysis again tomorrow. No acute indication for dialysis at present.  2. Anemia of chronic kidney disease: Hemoglobin 8.5 at last check. Continue Epogen 10,000 units IV with dialysis.   3. Secondary Hyperparathyroidism:  -Check phosphorus with next dialysis treatment.  4.  Left lower lobe bacterial pneumonia - Currently on vancomycin and Zosyn for treatment of pneumonia. This is appropriate as the patient could be considered as having healthcare associated pneumonia as  he does go to the dialysis center.    LOS: 3 Trevante Tennell 11/25/20169:46 AM

## 2015-06-02 NOTE — Progress Notes (Signed)
Speech Language Pathology Dysphagia Treatment Patient Details Name: Sean BoutonHolt Eskelson Jr. MRN: [id number] DOB: [birth year] Today's Date: 06/02/2015 Time: [id number] SLP Time Calculation (min) (ACUTE ONLY): 17 min  Assessment / Plan / Recommendation Clinical Impression       Diet Recommendation       SLP Plan Continue with current plan of care   Pertinent Vitals/Pain No pain reported   Swallowing Goals     General Behavior/Cognition: Alert;Cooperative;Pleasant mood Patient Positioning: Upright in bed  Oral Cavity - Oral Hygiene     Dysphagia Treatment Treatment Methods: Skilled observation;Upgraded PO texture trial;Patient/caregiver education Patient observed directly with PO's: Yes Type of PO's observed: Regular;Thin liquids;Nectar-thick liquids Feeding: Able to feed self;Needs assist Liquids provided via: Teaspoon;Cup;No straw Pharyngeal Phase Signs & Symptoms: Suspected delayed swallow initiation;Immediate throat clear;Immediate cough Type of cueing: Verbal Amount of cueing: Modified independent   GO     Sean Delgado 06/02/2015, 11:53 AM

## 2015-06-02 NOTE — Care Management Important Message (Signed)
Important Message  Patient Details  Name: Sean BoutonHolt Keena Jr. MRN: [id number] Date of Birth: 09/29/1933   Medicare Important Message Given:  Yes    Chapman FitchBOWEN, Jerret Mcbane T, RN 06/02/2015, 9:04 AM

## 2015-06-02 NOTE — Progress Notes (Signed)
Patients family, Louie Bunnora, called and updated, password provided. All questions answered to familys reported satisfaction.

## 2015-06-02 NOTE — Progress Notes (Addendum)
ANTIBIOTIC CONSULT NOTE - INITIAL  Pharmacy Consult for Vancomycin/Zosyn  Indication: Sepsis/HAP  No Known Allergies  Patient Measurements: Height:  (185.4 cm) Weight: 223 lb (101.152 kg) IBW/kg (Calculated) : 79.9 Adjusted Body Weight: 89.3 kg  Vital Signs: Temp: 97.8 F (36.6 C) (11/25 1211) Temp Source: Oral (11/25 1211) BP: 132/55 mmHg (11/25 1211) Pulse Rate: 103 (11/25 1211) Intake/Output from previous day: 11/24 0701 - 11/25 0700 In: 541 [P.O.:240; IV Piggyback:300] Out: 0  Intake/Output from this shift: Total I/O In: 240 [P.O.:240] Out: 0   Labs:  Recent Labs  05/31/15 0436 06/01/15 0714  WBC 15.1* 12.0*  HGB 8.4* 8.5*  PLT 259 245  CREATININE 4.53* 3.14*   Estimated Creatinine Clearance: 23.9 mL/min (by C-G formula based on Cr of 3.14). No results for input(s): VANCOTROUGH, VANCOPEAK, VANCORANDOM, GENTTROUGH, GENTPEAK, GENTRANDOM, TOBRATROUGH, TOBRAPEAK, TOBRARND, AMIKACINPEAK, AMIKACINTROU, AMIKACIN in the last 72 hours.   Microbiology: Recent Results (from the past 720 hour(s))  Blood Culture (routine x 2)     Status: None (Preliminary result)   Collection Time: 05/30/15  9:11 AM  Result Value Ref Range Status   Specimen Description BLOOD LEFT ASSIST CONTROL  Final   Special Requests   Final    BOTTLES DRAWN AEROBIC AND ANAEROBIC  2CC AERO 1CC ANAERO   Culture NO GROWTH 3 DAYS  Final   Report Status PENDING  Incomplete  Blood Culture (routine x 2)     Status: None (Preliminary result)   Collection Time: 05/30/15  9:11 AM  Result Value Ref Range Status   Specimen Description BLOOD LEFT FATTY CASTS  Final   Special Requests   Final    BOTTLES DRAWN AEROBIC AND ANAEROBIC  1CC ANAERO 2CC AERO   Culture NO GROWTH 3 DAYS  Final   Report Status PENDING  Incomplete  Urine culture     Status: None   Collection Time: 05/30/15  9:40 AM  Result Value Ref Range Status   Specimen Description URINE, RANDOM  Final   Special Requests NONE  Final   Culture NO GROWTH 2 DAYS  Final   Report Status 06/01/2015 FINAL  Final  MRSA PCR Screening     Status: Abnormal   Collection Time: 05/30/15  1:43 PM  Result Value Ref Range Status   MRSA by PCR POSITIVE (A) NEGATIVE Final    Comment:        The GeneXpert MRSA Assay (FDA approved for NASAL specimens only), is one component of a comprehensive MRSA colonization surveillance program. It is not intended to diagnose MRSA infection nor to guide or monitor treatment for MRSA infections. CRITICAL RESULT CALLED TO, READ BACK BY AND VERIFIED WITH:  BRANDY MANSFIELD AT 1506 05/30/15 SDR     Medical History: Past Medical History  Diagnosis Date  . Chronic renal failure, stage 4 (severe) (HCC)   . Hypertension   . CHF (congestive heart failure) (HCC)   . Gout   . Glaucoma   . Neuropathy (HCC)   . Stroke (HCC)   . Peripheral vascular disease (HCC)   . Coronary artery disease   . A-fib (HCC)   . Dysrhythmia   . Blindness of right eye   . Anemia     iron deficiency    Medications:  Scheduled:  . allopurinol  100 mg Oral Daily  . aspirin EC  81 mg Oral Daily  . atorvastatin  20 mg Oral Daily  . brimonidine  1 drop Left Eye BID  . clopidogrel  75 mg Oral Daily  . diltiazem  180 mg Oral Daily  . feeding supplement (NEPRO CARB STEADY)  237 mL Oral Q1500  . ferrous sulfate  325 mg Oral Daily  . furosemide  40 mg Oral BID  . heparin  5,000 Units Subcutaneous 3 times per day  . isosorbide mononitrate  60 mg Oral Daily  . latanoprost  1 drop Both Eyes QHS  . metoprolol  200 mg Oral Daily  . mupirocin ointment  1 application Nasal BID  . piperacillin-tazobactam (ZOSYN)  IV  3.375 g Intravenous Q12H  . pregabalin  75 mg Oral Daily  . senna-docusate  2 tablet Oral QHS  . sodium bicarbonate  650 mg Oral BID  . vancomycin  1,000 mg Intravenous Q T,Th,Sa-HD   Assessment: Patient is a 81 yo male admitted for possible sepsis/HAP.  Patient with ESRD requiring HD on Tues/Thurs/Sat.   Unclear as to when his las HD session was.    Goal of Therapy:  Vancomycin trough level 15-25 mcg/mL  Plan:  Patient received a dose of vancomycin yesterday, however the patient was not dialyzed. Will check a random vancomycin level and if it is within range, will continue vancomycin dosing of 1000 mg iv qHD. Will also need to f/u dialysis schedule and schedule vancomycin accordingly.  Continue Zosyn 3.375 g EI q 12 hours.     Luisa Harthristy, Linsey Arteaga D 06/02/2015,3:51 PM

## 2015-06-02 NOTE — Care Management (Signed)
On call [facility] nurse notified of admission

## 2015-06-03 LAB — VANCOMYCIN, RANDOM: VANCOMYCIN RM: 26 ug/mL

## 2015-06-03 LAB — CBC
HCT: 28 % — ABNORMAL LOW (ref 40.0–52.0)
Hemoglobin: 8.7 g/dL — ABNORMAL LOW (ref 13.0–18.0)
MCH: 25.1 pg — ABNORMAL LOW (ref 26.0–34.0)
MCHC: 31.1 g/dL — ABNORMAL LOW (ref 32.0–36.0)
MCV: 80.6 fL (ref 80.0–100.0)
PLATELETS: 278 10*3/uL (ref 150–440)
RBC: 3.47 MIL/uL — AB (ref 4.40–5.90)
RDW: 20.9 % — AB (ref 11.5–14.5)
WBC: 8.4 10*3/uL (ref 3.8–10.6)

## 2015-06-03 LAB — BASIC METABOLIC PANEL
ANION GAP: 10 (ref 5–15)
BUN: 49 mg/dL — ABNORMAL HIGH (ref 6–20)
CALCIUM: 8.3 mg/dL — AB (ref 8.9–10.3)
CO2: 32 mmol/L (ref 22–32)
Chloride: 97 mmol/L — ABNORMAL LOW (ref 101–111)
Creatinine, Ser: 4.34 mg/dL — ABNORMAL HIGH (ref 0.61–1.24)
GFR, EST AFRICAN AMERICAN: 14 mL/min — AB (ref >60)
GFR, EST NON AFRICAN AMERICAN: 12 mL/min — AB (ref >60)
Glucose, Bld: 100 mg/dL — ABNORMAL HIGH (ref 65–99)
Potassium: 4.1 mmol/L (ref 3.5–5.1)
SODIUM: 139 mmol/L (ref 135–145)

## 2015-06-03 LAB — PHOSPHORUS: PHOSPHORUS: 4 mg/dL (ref 2.5–4.6)

## 2015-06-03 MED ORDER — VANCOMYCIN HCL IN DEXTROSE 750-5 MG/150ML-% IV SOLN
750.0000 mg | INTRAVENOUS | Status: DC
  Administered 2015-06-03: 750 mg via INTRAVENOUS
  Filled 2015-06-03: qty 150

## 2015-06-03 MED ORDER — EPOETIN ALFA 10000 UNIT/ML IJ SOLN
10000.0000 [IU] | INTRAMUSCULAR | Status: DC
  Administered 2015-06-03: 10000 [IU] via INTRAVENOUS

## 2015-06-03 MED ORDER — DILTIAZEM HCL ER COATED BEADS 240 MG PO CP24
240.0000 mg | ORAL_CAPSULE | Freq: Every day | ORAL | Status: DC
  Administered 2015-06-04 – 2015-06-05 (×3): 240 mg via ORAL
  Filled 2015-06-03 (×3): qty 1

## 2015-06-03 NOTE — Progress Notes (Signed)
Subjective:  Patient due for hemodialysis today. Resting comfortably in bed at present. Currently denies shortness of breath.    Objective:  Vital signs in last 24 hours:  Temp:  [98 F (36.7 C)-98.3 F (36.8 C)] 98.1 F (36.7 C) (11/26 1142) Pulse Rate:  [111-134] 121 (11/26 1142) Resp:  [18-20] 20 (11/26 1142) BP: (118-126)/(57-74) 118/57 mmHg (11/26 1142) SpO2:  [91 %-93 %] 91 % (11/26 1142)  Weight change:  Filed Weights   05/31/15 1150 05/31/15 1541 05/31/15 1617  Weight: 102 kg (224 lb 13.9 oz) 101.6 kg (223 lb 15.8 oz) 101.152 kg (223 lb)    Intake/Output:    Intake/Output Summary (Last 24 hours) at 06/03/15 1217 Last data filed at 06/03/15 1136  Gross per 24 hour  Intake    470 ml  Output      3 ml  Net    467 ml    HEMODIALYSIS FLOW SHEET: 11/23  Blood Flow Rate (mL/min): 400 mL/min Arterial Pressure (mmHg): -220 mmHg Venous Pressure (mmHg): 150 mmHg Transmembrane Pressure (mmHg): 40 mmHg Ultrafiltration Rate (mL/min): 290 mL/min Dialysate Flow Rate (mL/min): 800 ml/min Conductivity: Machine : 14 Conductivity: Machine : 14 Dialysis Fluid Bolus: Normal Saline Bolus Amount (mL): 250 mL Intra-Hemodialysis Comments: 1000ml.Marland Kitchen.Marland Kitchen.Tx completed.   Physical Exam: General: NAD, laying in bed  HEENT Moist oral mucus membranes, decreased hearing  Neck supple  Pulm/lungs Basilar rales, normal effort  CVS/Heart Irregular, tachycardic  Abdomen:  Soft, NTND, BS present  Extremities: Trace b/l LE edema  Neurologic: Awake, alert, following commands  Skin: No acute rashes  Access: R IJ dialysis catheter       Basic Metabolic Panel:   Recent Labs Lab 05/29/15 1142 05/30/15 0911 05/31/15 0436 06/01/15 0714 06/03/15 0500  NA 142 144 145 140 139  K 3.6  3.7 3.9 3.8 3.9 4.1  CL 100* 102 104 98* 97*  CO2 31 30 30  33* 32  GLUCOSE 166* 163* 108* 145* 100*  BUN 42* 51* 59* 30* 49*  CREATININE 3.62* 4.48* 4.53* 3.14* 4.34*  CALCIUM 8.7* 8.6* 8.2* 8.0* 8.3*   PHOS  --   --   --   --  4.0     CBC:  Recent Labs Lab 05/29/15 1142 05/30/15 0911 05/31/15 0436 06/01/15 0714 06/03/15 0500  WBC 10.2 16.3* 15.1* 12.0* 8.4  NEUTROABS  --  14.6*  --   --   --   HGB 9.9* 9.9* 8.4* 8.5* 8.7*  HCT 32.0* 32.4* 26.8* 26.9* 28.0*  MCV 82.8 81.7 82.3 81.5 80.6  PLT 335 319 259 245 278      Microbiology:  Recent Results (from the past 720 hour(s))  Blood Culture (routine x 2)     Status: None (Preliminary result)   Collection Time: 05/30/15  9:11 AM  Result Value Ref Range Status   Specimen Description BLOOD LEFT ASSIST CONTROL  Final   Special Requests   Final    BOTTLES DRAWN AEROBIC AND ANAEROBIC  2CC AERO 1CC ANAERO   Culture NO GROWTH 4 DAYS  Final   Report Status PENDING  Incomplete  Blood Culture (routine x 2)     Status: None (Preliminary result)   Collection Time: 05/30/15  9:11 AM  Result Value Ref Range Status   Specimen Description BLOOD LEFT FATTY CASTS  Final   Special Requests   Final    BOTTLES DRAWN AEROBIC AND ANAEROBIC  1CC ANAERO 2CC AERO   Culture NO GROWTH 4 DAYS  Final  Report Status PENDING  Incomplete  Urine culture     Status: None   Collection Time: 05/30/15  9:40 AM  Result Value Ref Range Status   Specimen Description URINE, RANDOM  Final   Special Requests NONE  Final   Culture NO GROWTH 2 DAYS  Final   Report Status 06/01/2015 FINAL  Final  MRSA PCR Screening     Status: Abnormal   Collection Time: 05/30/15  1:43 PM  Result Value Ref Range Status   MRSA by PCR POSITIVE (A) NEGATIVE Final    Comment:        The GeneXpert MRSA Assay (FDA approved for NASAL specimens only), is one component of a comprehensive MRSA colonization surveillance program. It is not intended to diagnose MRSA infection nor to guide or monitor treatment for MRSA infections. CRITICAL RESULT CALLED TO, READ BACK BY AND VERIFIED WITH:  BRANDY MANSFIELD AT 1506 05/30/15 SDR     Coagulation Studies: No results for  input(s): LABPROT, INR in the last 72 hours.  Urinalysis: No results for input(s): COLORURINE, LABSPEC, PHURINE, GLUCOSEU, HGBUR, BILIRUBINUR, KETONESUR, PROTEINUR, UROBILINOGEN, NITRITE, LEUKOCYTESUR in the last 72 hours.  Invalid input(s): APPERANCEUR    Imaging: Mr Hip Right Wo Contrast  06/02/2015  CLINICAL DATA:  The patient slipped off the seated his walker 3 weeks ago. Right hip pain and tenderness. Initial encounter. EXAM: MR OF THE RIGHT HIP WITHOUT CONTRAST TECHNIQUE: Multiplanar, multisequence MR imaging was performed. No intravenous contrast was administered. COMPARISON:  None. FINDINGS: Bones: There is no fracture or dislocation. No avascular necrosis of the femoral heads is seen. Marrow signal is unremarkable. Articular cartilage and labrum Articular cartilage:  Minimally degenerated. Labrum:  Unremarkable. Joint or bursal effusion Joint effusion:  None. Bursae:  Unremarkable. Muscles and tendons Muscles and tendons:  Intact. Other findings Miscellaneous:  None. IMPRESSION: Negative for fracture. No finding to explain the patient's symptoms. Mild degenerative change is seen about the hips. Electronically Signed   By: Drusilla Kanner M.D.   On: 06/02/2015 15:17     Medications:     . allopurinol  100 mg Oral Daily  . aspirin EC  81 mg Oral Daily  . atorvastatin  20 mg Oral Daily  . brimonidine  1 drop Left Eye BID  . clopidogrel  75 mg Oral Daily  . diltiazem  240 mg Oral Daily  . epoetin (EPOGEN/PROCRIT) injection  10,000 Units Intravenous Q T,Th,Sa-HD  . feeding supplement (NEPRO CARB STEADY)  237 mL Oral Q1500  . ferrous sulfate  325 mg Oral Daily  . furosemide  40 mg Oral BID  . heparin  5,000 Units Subcutaneous 3 times per day  . isosorbide mononitrate  60 mg Oral Daily  . latanoprost  1 drop Both Eyes QHS  . metoprolol  200 mg Oral Daily  . mupirocin ointment  1 application Nasal BID  . piperacillin-tazobactam (ZOSYN)  IV  3.375 g Intravenous Q12H  . pregabalin   75 mg Oral Daily  . senna-docusate  2 tablet Oral QHS  . sodium bicarbonate  650 mg Oral BID  . vancomycin  1,000 mg Intravenous Q T,Th,Sa-HD     Assessment/ Plan:  81 y.o.[place] male with right eye blindness, diastolic congestive heart failure, hypertension, gout, glaucoma, peripheral neuropathy, atrial fibrillation, anemia, carotid stenosis status post bilateral CEA, CVA, peripheral vascular disease who was admitted to [facility] on 04/08/2015 for shortness of breath.   Follows with [facility]/ [facility]/ TTS  1. End Stage  Renal Disease: patient started on hemodialysis 10/6. - Patient due for dialysis today.  Orders have been prepared.  2. Anemia of chronic kidney disease: Hemoglobin currently 8.7. Continue Epogen 10,000 units IV with dialysis.   3. Secondary Hyperparathyroidism:  -Phosphorous 4.0 and acceptable.  4.  Left lower lobe bacterial pneumonia - Could be considered healthcare associated. Continue Zosyn and vancomycin.  LOS: 4 Rosi Secrist 11/26/201612:17 PM

## 2015-06-03 NOTE — Progress Notes (Signed)
[facility] [place] [facility]   PATIENT NAME: Sean KussmaulHolt Delgado    MR#:  [id number]  DATE OF BIRTH:  06-Nov-1933  SUBJECTIVE:  no fevers for 48 hrs and hip pain much better REVIEW OF SYSTEMS:  Review of Systems  Constitutional: Negative for fever, weight loss, malaise/fatigue and diaphoresis.  HENT: Negative for ear discharge, ear pain, hearing loss, nosebleeds, sore throat and tinnitus.   Eyes: Negative for blurred vision and pain.  Respiratory: Negative for cough, hemoptysis, shortness of breath and wheezing.   Cardiovascular: Negative for chest pain, palpitations, orthopnea and leg swelling.  Gastrointestinal: Negative for heartburn, nausea, vomiting, abdominal pain, diarrhea, constipation and blood in stool.  Genitourinary: Negative for dysuria, urgency and frequency.  Musculoskeletal: Negative for myalgias, back pain and joint pain.  Skin: Negative for itching and rash.  Neurological: Positive for weakness. Negative for dizziness, tingling, tremors, focal weakness, seizures and headaches.  Psychiatric/Behavioral: Negative for depression. The patient is not nervous/anxious.    DRUG ALLERGIES:  No Known Allergies VITALS:  Blood pressure 126/69, pulse 111, temperature 98.3 F (36.8 C), temperature source Oral, resp. rate 18, height 6\' 1"  (1.854 m), weight 101.152 kg (223 lb), SpO2 92 %. PHYSICAL EXAMINATION:  Physical Exam  Constitutional: He is oriented to person, place, and time and well-developed, well-nourished, and in no distress.  HENT:  Head: Normocephalic and atraumatic.  Eyes: Conjunctivae and EOM are normal. Pupils are equal, round, and reactive to light.  Neck: Normal range of motion. Neck supple. No tracheal deviation present. No thyromegaly present.  Cardiovascular: Normal rate, regular rhythm and normal heart sounds.   Pulmonary/Chest: Effort normal and breath sounds normal. No respiratory distress. He has no wheezes. He exhibits no  tenderness.  Abdominal: Soft. Bowel sounds are normal. He exhibits no distension. There is no tenderness.  Musculoskeletal: Normal range of motion.  Neurological: He is alert and oriented to person, place, and time. No cranial nerve deficit.  Skin: Skin is warm and dry. No rash noted.  Psychiatric: Mood and affect normal.   LABORATORY PANEL:   CBC  Recent Labs Lab 06/03/15 0500  WBC 8.4  HGB 8.7*  HCT 28.0*  PLT 278   ------------------------------------------------------------------------------------------------------------------ Chemistries   Recent Labs Lab 05/30/15 0911  06/03/15 0500  NA 144  < > 139  K 3.9  < > 4.1  CL 102  < > 97*  CO2 30  < > 32  GLUCOSE 163*  < > 100*  BUN 51*  < > 49*  CREATININE 4.48*  < > 4.34*  CALCIUM 8.6*  < > 8.3*  AST 27  --   --   ALT 18  --   --   ALKPHOS 99  --   --   BILITOT 1.1  --   --   < > = values in this interval not displayed. RADIOLOGY:  Mr Hip Right Wo Contrast  06/02/2015  CLINICAL DATA:  The patient slipped off the seated his walker 3 weeks ago. Right hip pain and tenderness. Initial encounter. EXAM: MR OF THE RIGHT HIP WITHOUT CONTRAST TECHNIQUE: Multiplanar, multisequence MR imaging was performed. No intravenous contrast was administered. COMPARISON:  None. FINDINGS: Bones: There is no fracture or dislocation. No avascular necrosis of the femoral heads is seen. Marrow signal is unremarkable. Articular cartilage and labrum Articular cartilage:  Minimally degenerated. Labrum:  Unremarkable. Joint or bursal effusion Joint effusion:  None. Bursae:  Unremarkable. Muscles and tendons Muscles and tendons:  Intact. Other  findings Miscellaneous:  None. IMPRESSION: Negative for fracture. No finding to explain the patient's symptoms. Mild degenerative change is seen about the hips. Electronically Signed   By: Drusilla Kanner M.D.   On: 06/02/2015 15:17   ASSESSMENT AND PLAN:   * Sepsis: Present on admission likely due to  pneumonia, now resolving continue Zosyn and vancomycin. MRI hip Negative for any acute pathology, mild DJD.   * Left lower lobe pneumonia (HAP): continue vancomycin, Zosyn,  Pending c/s infectious disease.  Blood cultures are negative to date  * Hypoxia: Due to pneumonia  * A. fib with RVR: His heart rate is still up to 110 to 130s.  Increase the dose of Cardizem from 180->240 and continue Lopressor by mouth.  He is at high risk for fall and is not a good candidate for anticoagulation  * ESRD on hemodialysis: Dialysis per nephrology. will get One today  * AV fistula malfunction: occluded left arm cephalic vein portion of radiocephalic AV fistula unable to be recanalized   * Hypertension: Controlled on Imdur, metoprolol and Cardizem along with Lasix   Management plans discussed with the patient and he is in agreement  CODE STATUS: Full code  TOTAL TIME TAKING CARE OF THIS PATIENT: 25 minutes.   More than 50% of the time was spent in counseling/coordination of care: YES (d/w Daughter Chapman Moss @ [phone])  POSSIBLE D/C IN 1-2 DAYS, DEPENDING ON CLINICAL CONDITION.  Back to [facility], [place] M.D on 06/03/2015 at 8:29 AM  Between 7am to 6pm - Pager - [phone]  After 6pm go to [email or web address] - password [email or web address] [facility]  [facility]  Office  [phone]  CC: Primary care physician; Bobbye Morton, MD

## 2015-06-03 NOTE — Progress Notes (Addendum)
ANTIBIOTIC CONSULT NOTE - Follow up  Pharmacy Consult for Vancomycin/Zosyn  Indication: Sepsis/HAP  No Known Allergies  Patient Measurements: Height: 6\' 1"  (185.4 cm) Weight: 223 lb (101.152 kg) IBW/kg (Calculated) : 79.9 Adjusted Body Weight: 89.3 kg  Vital Signs: Temp: 98.1 F (36.7 C) (11/26 1142) Temp Source: Oral (11/26 1142) BP: 118/57 mmHg (11/26 1142) Pulse Rate: 121 (11/26 1142) Intake/Output from previous day: 11/25 0701 - 11/26 0700 In: 290 [P.O.:240; IV Piggyback:50] Out: 3 [Urine:3] Intake/Output from this shift: Total I/O In: 300 [IV Piggyback:300] Out: 0   Labs:  Recent Labs  06/01/15 0714 06/03/15 0500  WBC 12.0* 8.4  HGB 8.5* 8.7*  PLT 245 278  CREATININE 3.14* 4.34*   Estimated Creatinine Clearance: 17.3 mL/min (by C-G formula based on Cr of 4.34).  Recent Labs  06/03/15 0500  [facility]     Microbiology: Recent Results (from the past 720 hour(s))  Blood Culture (routine x 2)     Status: None (Preliminary result)   Collection Time: 05/30/15  9:11 AM  Result Value Ref Range Status   Specimen Description BLOOD LEFT ASSIST CONTROL  Final   Special Requests   Final    BOTTLES DRAWN AEROBIC AND ANAEROBIC  2CC AERO 1CC ANAERO   Culture NO GROWTH 4 DAYS  Final   Report Status PENDING  Incomplete  Blood Culture (routine x 2)     Status: None (Preliminary result)   Collection Time: 05/30/15  9:11 AM  Result Value Ref Range Status   Specimen Description BLOOD LEFT FATTY CASTS  Final   Special Requests   Final    BOTTLES DRAWN AEROBIC AND ANAEROBIC  1CC ANAERO 2CC AERO   Culture NO GROWTH 4 DAYS  Final   Report Status PENDING  Incomplete  Urine culture     Status: None   Collection Time: 05/30/15  9:40 AM  Result Value Ref Range Status   Specimen Description URINE, RANDOM  Final   Special Requests NONE  Final   Culture NO GROWTH 2 DAYS  Final   Report Status 06/01/2015 FINAL  Final  MRSA PCR Screening     Status: Abnormal   Collection Time: 05/30/15  1:43 PM  Result Value Ref Range Status   MRSA by PCR POSITIVE (A) NEGATIVE Final    Comment:        The GeneXpert MRSA Assay (FDA approved for NASAL specimens only), is one component of a comprehensive MRSA colonization surveillance program. It is not intended to diagnose MRSA infection nor to guide or monitor treatment for MRSA infections. CRITICAL RESULT CALLED TO, READ BACK BY AND VERIFIED WITH:  BRANDY MANSFIELD AT 1506 05/30/15 SDR     Medical History: Past Medical History  Diagnosis Date  . Chronic renal failure, stage 4 (severe) (HCC)   . Hypertension   . CHF (congestive heart failure) (HCC)   . Gout   . Glaucoma   . Neuropathy (HCC)   . Stroke (HCC)   . Peripheral vascular disease (HCC)   . Coronary artery disease   . A-fib (HCC)   . Dysrhythmia   . Blindness of right eye   . Anemia     iron deficiency    Medications:  Scheduled:  . allopurinol  100 mg Oral Daily  . aspirin EC  81 mg Oral Daily  . atorvastatin  20 mg Oral Daily  . brimonidine  1 drop Left Eye BID  . clopidogrel  75 mg Oral Daily  . diltiazem  240 mg Oral Daily  . epoetin (EPOGEN/PROCRIT) injection  10,000 Units Intravenous Q T,Th,Sa-HD  . feeding supplement (NEPRO CARB STEADY)  237 mL Oral Q1500  . ferrous sulfate  325 mg Oral Daily  . furosemide  40 mg Oral BID  . heparin  5,000 Units Subcutaneous 3 times per day  . isosorbide mononitrate  60 mg Oral Daily  . latanoprost  1 drop Both Eyes QHS  . metoprolol  200 mg Oral Daily  . mupirocin ointment  1 application Nasal BID  . piperacillin-tazobactam (ZOSYN)  IV  3.375 g Intravenous Q12H  . pregabalin  75 mg Oral Daily  . senna-docusate  2 tablet Oral QHS  . sodium bicarbonate  650 mg Oral BID  . vancomycin  1,000 mg Intravenous Q T,Th,Sa-HD   Assessment: Patient is a 81 yo male admitted for possible sepsis/HAP.  Patient with ESRD requiring HD on Tues/Thurs/Sat.    Goal of Therapy:  Vancomycin trough  level 15-25 mcg/mL  Plan:  Patient received a dose of vancomycin yesterday, however the patient was not dialyzed. Will check a random vancomycin level and if it is within range, will continue vancomycin dosing of 1000 mg IV qHD. Will also need to f/u dialysis schedule and schedule vancomycin accordingly.    11/26 Vanc Trough = 26.  Patient received vancomycin 1g today. Will continue subsequent dosing with Vancomycin  IV with each dialysis session.  Next trough ordered for 12/3 at 08:00.  Continue Zosyn 3.375 g EI q 12 hours.     Stormy Card, RPh Clinical Pharmacist 06/03/2015,12:59 PM

## 2015-06-03 NOTE — Progress Notes (Signed)
Patient returned from HD and per HD RN, 1.5L of fluid were pulled off. Patient denies pain and no signs of discomfort or distress noted. Nursing staff will continue to monitor. Lamonte RicherKara A Troyce Febo, RN

## 2015-06-04 ENCOUNTER — Inpatient Hospital Stay: Payer: Medicare (Managed Care)

## 2015-06-04 LAB — CULTURE, BLOOD (ROUTINE X 2)
Culture: NO GROWTH
Culture: NO GROWTH

## 2015-06-04 LAB — BASIC METABOLIC PANEL
Anion gap: 11 (ref 5–15)
BUN: 25 mg/dL — AB (ref 6–20)
CALCIUM: 8.4 mg/dL — AB (ref 8.9–10.3)
CO2: 30 mmol/L (ref 22–32)
CREATININE: 2.92 mg/dL — AB (ref 0.61–1.24)
Chloride: 99 mmol/L — ABNORMAL LOW (ref 101–111)
GFR calc Af Amer: 22 mL/min — ABNORMAL LOW (ref >60)
GFR, EST NON AFRICAN AMERICAN: 19 mL/min — AB (ref >60)
GLUCOSE: 114 mg/dL — AB (ref 65–99)
Potassium: 4.1 mmol/L (ref 3.5–5.1)
Sodium: 140 mmol/L (ref 135–145)

## 2015-06-04 LAB — CBC
HEMATOCRIT: 30.7 % — AB (ref 40.0–52.0)
Hemoglobin: 9.5 g/dL — ABNORMAL LOW (ref 13.0–18.0)
MCH: 25 pg — ABNORMAL LOW (ref 26.0–34.0)
MCHC: 30.8 g/dL — AB (ref 32.0–36.0)
MCV: 81.2 fL (ref 80.0–100.0)
PLATELETS: 257 10*3/uL (ref 150–440)
RBC: 3.77 MIL/uL — ABNORMAL LOW (ref 4.40–5.90)
RDW: 21.3 % — AB (ref 11.5–14.5)
WBC: 9.9 10*3/uL (ref 3.8–10.6)

## 2015-06-04 MED ORDER — DILTIAZEM HCL 25 MG/5ML IV SOLN: 10.0000 mg | INTRAVENOUS | Status: DC | PRN

## 2015-06-04 MED ORDER — DILTIAZEM HCL 25 MG/5ML IV SOLN
10.0000 mg | INTRAVENOUS | Status: DC | PRN
  Administered 2015-06-04: 10 mg via INTRAVENOUS
  Filled 2015-06-04: qty 5

## 2015-06-04 NOTE — Progress Notes (Signed)
[facility] [place] [facility]   PATIENT NAME: Sean KussmaulHolt Delgado    MR#:  [id number]  DATE OF BIRTH:  12-08-1933  SUBJECTIVE:  Improving.  REVIEW OF SYSTEMS:  Review of Systems  Constitutional: Negative for fever, weight loss, malaise/fatigue and diaphoresis.  HENT: Negative for ear discharge, ear pain, hearing loss, nosebleeds, sore throat and tinnitus.   Eyes: Negative for blurred vision and pain.  Respiratory: Negative for cough, hemoptysis, shortness of breath and wheezing.   Cardiovascular: Negative for chest pain, palpitations, orthopnea and leg swelling.  Gastrointestinal: Negative for heartburn, nausea, vomiting, abdominal pain, diarrhea, constipation and blood in stool.  Genitourinary: Negative for dysuria, urgency and frequency.  Musculoskeletal: Negative for myalgias, back pain and joint pain.  Skin: Negative for itching and rash.  Neurological: Positive for weakness. Negative for dizziness, tingling, tremors, focal weakness, seizures and headaches.  Psychiatric/Behavioral: Negative for depression. The patient is not nervous/anxious.    DRUG ALLERGIES:  No Known Allergies VITALS:  Blood pressure 122/64, pulse 67, temperature 97.5 F (36.4 C), temperature source Oral, resp. rate 22, height 6\' 1"  (1.854 m), weight 102.3 kg (225 lb 8.5 oz), SpO2 98 %. PHYSICAL EXAMINATION:  Physical Exam  Constitutional: He is oriented to person, place, and time and well-developed, well-nourished, and in no distress.  HENT:  Head: Normocephalic and atraumatic.  Eyes: Conjunctivae and EOM are normal. Pupils are equal, round, and reactive to light.  Neck: Normal range of motion. Neck supple. No tracheal deviation present. No thyromegaly present.  Cardiovascular: Normal rate, regular rhythm and normal heart sounds.   Pulmonary/Chest: Effort normal and breath sounds normal. No respiratory distress. He has no wheezes. He exhibits no tenderness.  Abdominal: Soft. Bowel  sounds are normal. He exhibits no distension. There is no tenderness.  Musculoskeletal: Normal range of motion.  Neurological: He is alert and oriented to person, place, and time. No cranial nerve deficit.  Skin: Skin is warm and dry. No rash noted.  Psychiatric: Mood and affect normal.   LABORATORY PANEL:   CBC  Recent Labs Lab 06/04/15 0453  WBC 9.9  HGB 9.5*  HCT 30.7*  PLT 257   ------------------------------------------------------------------------------------------------------------------ Chemistries   Recent Labs Lab 05/30/15 0911  06/04/15 0453  NA 144  < > 140  K 3.9  < > 4.1  CL 102  < > 99*  CO2 30  < > 30  GLUCOSE 163*  < > 114*  BUN 51*  < > 25*  CREATININE 4.48*  < > 2.92*  CALCIUM 8.6*  < > 8.4*  AST 27  --   --   ALT 18  --   --   ALKPHOS 99  --   --   BILITOT 1.1  --   --   < > = values in this interval not displayed. RADIOLOGY:  Dg Chest 2 View  06/04/2015  CLINICAL DATA:  Short of breath, dialysis patient EXAM: CHEST  2 VIEW COMPARISON:  05/30/2015 FINDINGS: RIGHT hemi dialysis catheter unchanged. Stable enlarged cardiac silhouette. No effusion, infiltrate, pneumothorax. Degenerative osteophytosis of the thoracic spine. IMPRESSION: Cardiomegaly without acute cardiopulmonary findings. Electronically Signed   By: Genevive BiStewart  Edmunds M.D.   On: 06/04/2015 10:09   ASSESSMENT AND PLAN:   * Sepsis: Present on admission likely due to pneumonia, now resolving continue Zosyn and vancomycin. MRI hip Negative for any acute pathology, mild DJD.   * Left lower lobe pneumonia (HAP): continue vancomycin, Zosyn,  Pending c/s infectious disease.  Blood cultures are negative to date  * Hypoxia: Due to pneumonia, wean Os as tolerated  * A. fib with RVR: HR much better controlled on higher dose of cardizem.  He is at high risk for fall and is not a good candidate for anticoagulation  * ESRD on hemodialysis: Dialysis per nephrology. will get One today  * AV fistula  malfunction: occluded left arm cephalic vein portion of radiocephalic AV fistula unable to be recanalized   * Hypertension: Controlled on Imdur, metoprolol and Cardizem along with Lasix   Management plans discussed with the patient and he is in agreement  CODE STATUS: Full code  TOTAL TIME TAKING CARE OF THIS PATIENT: 25 minutes.   More than 50% of the time was spent in counseling/coordination of care: YES (d/w Daughter Chapman Moss @ [phone])  POSSIBLE D/C IN AM, DEPENDING ON CLINICAL CONDITION.  Back to [facility], [place] M.D on 06/04/2015 at 1:28 PM  Between 7am to 6pm - Pager - [phone]  After 6pm go to [email or web address] - password [email or web address] [facility]  [facility]  Office  [phone]  CC: Primary care physician; Bobbye Morton, MD

## 2015-06-04 NOTE — Progress Notes (Signed)
[name] HR sustaining in the 120s-140s. Patient did not receive first dose of oral cardizem yesterday morning. Notified MD and received orders for cardizem 10mg  IV every 4 hours PRN for HR > 110. Nursing staff will continue to monitor. Lamonte RicherKara A Leean Amezcua, RN

## 2015-06-04 NOTE — Progress Notes (Signed)
Subjective:  Patient completed dialysis yesterday. Tachycardia improved this a.m. Overall feeling better today.    Objective:  Vital signs in last 24 hours:  Temp:  [97.5 F (36.4 C)-98.6 F (37 C)] 97.5 F (36.4 C) (11/27 0900) Pulse Rate:  [55-133] 76 (11/27 0900) Resp:  [19-31] 20 (11/27 0900) BP: (106-141)/(57-88) 127/88 mmHg (11/27 0900) SpO2:  [90 %-100 %] 95 % (11/27 0900) Weight:  [102.3 kg (225 lb 8.5 oz)] 102.3 kg (225 lb 8.5 oz) (11/26 1900)  Weight change:  Filed Weights   05/31/15 1541 05/31/15 1617 06/03/15 1900  Weight: 101.6 kg (223 lb 15.8 oz) 101.152 kg (223 lb) 102.3 kg (225 lb 8.5 oz)     HEMODIALYSIS FLOWSHEET:  Blood Flow Rate (mL/min): 300 mL/min Arterial Pressure (mmHg): -180 mmHg Venous Pressure (mmHg): 120 mmHg Transmembrane Pressure (mmHg): 30 mmHg Ultrafiltration Rate (mL/min): 570 mL/min Dialysate Flow Rate (mL/min): 800 ml/min Conductivity: Machine : 14.2 Conductivity: Machine : 14.2 Dialysis Fluid Bolus: Normal Saline Bolus Amount (mL): 250 mL Intra-Hemodialysis Comments: HD COMPLETED (TOTAL UF )   Intake/Output:    Intake/Output Summary (Last 24 hours) at 06/04/15 1119 Last data filed at 06/04/15 0900  Gross per 24 hour  Intake    200 ml  Output   1288 ml  Net  -1088 ml    HEMODIALYSIS FLOW SHEET: 11/23  Blood Flow Rate (mL/min): 300 mL/min Arterial Pressure (mmHg): -180 mmHg Venous Pressure (mmHg): 120 mmHg Transmembrane Pressure (mmHg): 30 mmHg Ultrafiltration Rate (mL/min): 570 mL/min Dialysate Flow Rate (mL/min): 800 ml/min Conductivity: Machine : 14.2 Conductivity: Machine : 14.2 Dialysis Fluid Bolus: Normal Saline Bolus Amount (mL): 250 mL Intra-Hemodialysis Comments: HD COMPLETED (TOTAL UF )   Physical Exam: General: NAD, laying in bed  HEENT Moist oral mucus membranes, decreased hearing  Neck supple  Pulm/lungs Basilar rales, normal effort  CVS/Heart Irregular, tachycardic  Abdomen:  Soft,  NTND, BS present  Extremities: Trace b/l LE edema  Neurologic: Awake, alert, following commands  Skin: No acute rashes  Access: R IJ dialysis catheter       Basic Metabolic Panel:   Recent Labs Lab 05/30/15 0911 05/31/15 0436 06/01/15 0714 06/03/15 0500 06/04/15 0453  NA 144 145 140 139 140  K 3.9 3.8 3.9 4.1 4.1  CL 102 104 98* 97* 99*  CO2 30 30 33* 32 30  GLUCOSE 163* 108* 145* 100* 114*  BUN 51* 59* 30* 49* 25*  CREATININE 4.48* 4.53* 3.14* 4.34* 2.92*  CALCIUM 8.6* 8.2* 8.0* 8.3* 8.4*  PHOS  --   --   --  4.0  --      CBC:  Recent Labs Lab 05/30/15 0911 05/31/15 0436 06/01/15 0714 06/03/15 0500 06/04/15 0453  WBC 16.3* 15.1* 12.0* 8.4 9.9  NEUTROABS 14.6*  --   --   --   --   HGB 9.9* 8.4* 8.5* 8.7* 9.5*  HCT 32.4* 26.8* 26.9* 28.0* 30.7*  MCV 81.7 82.3 81.5 80.6 81.2  PLT 319 259 245 278 257      Microbiology:  Recent Results (from the past 720 hour(s))  Blood Culture (routine x 2)     Status: None   Collection Time: 05/30/15  9:11 AM  Result Value Ref Range Status   Specimen Description BLOOD LEFT ASSIST CONTROL  Final   Special Requests   Final    BOTTLES DRAWN AEROBIC AND ANAEROBIC  2CC AERO 1CC ANAERO   Culture NO GROWTH 5 DAYS  Final   Report Status 06/04/2015 FINAL  Final  Blood Culture (routine x 2)     Status: None   Collection Time: 05/30/15  9:11 AM  Result Value Ref Range Status   Specimen Description BLOOD LEFT FATTY CASTS  Final   Special Requests   Final    BOTTLES DRAWN AEROBIC AND ANAEROBIC  1CC ANAERO 2CC AERO   Culture NO GROWTH 5 DAYS  Final   Report Status 06/04/2015 FINAL  Final  Urine culture     Status: None   Collection Time: 05/30/15  9:40 AM  Result Value Ref Range Status   Specimen Description URINE, RANDOM  Final   Special Requests NONE  Final   Culture NO GROWTH 2 DAYS  Final   Report Status 06/01/2015 FINAL  Final  MRSA PCR Screening     Status: Abnormal   Collection Time: 05/30/15  1:43 PM  Result Value  Ref Range Status   MRSA by PCR POSITIVE (A) NEGATIVE Final    Comment:        The GeneXpert MRSA Assay (FDA approved for NASAL specimens only), is one component of a comprehensive MRSA colonization surveillance program. It is not intended to diagnose MRSA infection nor to guide or monitor treatment for MRSA infections. CRITICAL RESULT CALLED TO, READ BACK BY AND VERIFIED WITH:  BRANDY MANSFIELD AT 1506 05/30/15 SDR     Coagulation Studies: No results for input(s): LABPROT, INR in the last 72 hours.  Urinalysis: No results for input(s): COLORURINE, LABSPEC, PHURINE, GLUCOSEU, HGBUR, BILIRUBINUR, KETONESUR, PROTEINUR, UROBILINOGEN, NITRITE, LEUKOCYTESUR in the last 72 hours.  Invalid input(s): APPERANCEUR    Imaging: Dg Chest 2 View  06/04/2015  CLINICAL DATA:  Short of breath, dialysis patient EXAM: CHEST  2 VIEW COMPARISON:  05/30/2015 FINDINGS: RIGHT hemi dialysis catheter unchanged. Stable enlarged cardiac silhouette. No effusion, infiltrate, pneumothorax. Degenerative osteophytosis of the thoracic spine. IMPRESSION: Cardiomegaly without acute cardiopulmonary findings. Electronically Signed   By: Genevive BiStewart  Edmunds M.D.   On: 06/04/2015 10:09   Mr Hip Right Wo Contrast  06/02/2015  CLINICAL DATA:  The patient slipped off the seated his walker 3 weeks ago. Right hip pain and tenderness. Initial encounter. EXAM: MR OF THE RIGHT HIP WITHOUT CONTRAST TECHNIQUE: Multiplanar, multisequence MR imaging was performed. No intravenous contrast was administered. COMPARISON:  None. FINDINGS: Bones: There is no fracture or dislocation. No avascular necrosis of the femoral heads is seen. Marrow signal is unremarkable. Articular cartilage and labrum Articular cartilage:  Minimally degenerated. Labrum:  Unremarkable. Joint or bursal effusion Joint effusion:  None. Bursae:  Unremarkable. Muscles and tendons Muscles and tendons:  Intact. Other findings Miscellaneous:  None. IMPRESSION: Negative for  fracture. No finding to explain the patient's symptoms. Mild degenerative change is seen about the hips. Electronically Signed   By: Drusilla Kannerhomas  Dalessio M.D.   On: 06/02/2015 15:17     Medications:     . allopurinol  100 mg Oral Daily  . aspirin EC  81 mg Oral Daily  . atorvastatin  20 mg Oral Daily  . brimonidine  1 drop Left Eye BID  . clopidogrel  75 mg Oral Daily  . diltiazem  240 mg Oral Daily  . epoetin (EPOGEN/PROCRIT) injection  10,000 Units Intravenous Q T,Th,Sa-HD  . feeding supplement (NEPRO CARB STEADY)  237 mL Oral Q1500  . ferrous sulfate  325 mg Oral Daily  . furosemide  40 mg Oral BID  . heparin  5,000 Units Subcutaneous 3 times per day  . isosorbide mononitrate  60 mg Oral  Daily  . latanoprost  1 drop Both Eyes QHS  . metoprolol  200 mg Oral Daily  . piperacillin-tazobactam (ZOSYN)  IV  3.375 g Intravenous Q12H  . pregabalin  75 mg Oral Daily  . senna-docusate  2 tablet Oral QHS  . sodium bicarbonate  650 mg Oral BID  . [START ON 06/06/2015] vancomycin  750 mg Intravenous Q T,Th,Sa-HD     Assessment/ Plan:  81 y.o.[place] male with right eye blindness, diastolic congestive heart failure, hypertension, gout, glaucoma, peripheral neuropathy, atrial fibrillation, anemia, carotid stenosis status post bilateral CEA, CVA, peripheral vascular disease who was admitted to [facility] on 04/08/2015 for shortness of breath.   Follows with [facility]/ TTS  1. End Stage Renal Disease: patient started on hemodialysis 10/6. - patient completed dialysis yesterday.  Ultrafiltration achieved was 1.7 kg.  2. Anemia of chronic kidney disease: ontinue Epogen 10,000 units IV with dialysis..   3. Secondary Hyperparathyroidism:  -hosphorus under good control at 4.0.  Continue to monitor periodically.  4.  Left lower lobe bacterial pneumonia - Could be considered healthcare associated. Continue Zosyn and vancomycin.  LOS: 5 Jeni Duling,  Colletta Spillers 11/27/201611:19 AM

## 2015-06-04 NOTE — Progress Notes (Signed)
[facility]  Support system is sister Rivka BarbaraGlenda. Clinical Social Worker will continue to follow patient to assist with ongoing and disposition needs.  Sammuel Hineseborah Moore. Theresia MajorsLCSWA, MSW Clinical Social Work Department [phone] 11:26 AM

## 2015-06-04 NOTE — Progress Notes (Signed)
[facility] had PO cardizem 240mg  24hr capsule ordered and was supposed to receive the first dose at 1000 11/26, but it was never given for an unknown reason. Notified MD and MD gave orders to give IV cardizem first and if unsuccessful, follow up with the capsule. Administered IV cardizem and a PRN dose of Vicodin as patient was complaining of hip pain. Nursing staff will continue to monitor. Lamonte RicherKara A Rebbecca Osuna, RN

## 2015-06-05 MED ORDER — AMOXICILLIN-POT CLAVULANATE 875-125 MG PO TABS: 1.0000 | ORAL_TABLET | Freq: Two times a day (BID) | ORAL | Status: DC

## 2015-06-05 MED ORDER — HYDROCODONE-ACETAMINOPHEN 5-325 MG PO TABS: 1.0000 | ORAL_TABLET | Freq: Four times a day (QID) | ORAL | Status: DC | PRN

## 2015-06-05 NOTE — Progress Notes (Signed)
Pt is a&o, VSS including O2 sats on room air with no complaints of pain or discomfort. [facility]. [facility]. Pt currently awaiting EMS for transport.

## 2015-06-05 NOTE — Care Management (Signed)
Plan for [facility] to [facility].  [facility] notified

## 2015-06-05 NOTE — Discharge Summary (Signed)
[facility] [place] [facility]   PATIENT NAME: Sean KussmaulHolt Delgado    MR#:  [id number]  DATE OF BIRTH:  02/17/1934  DATE OF ADMISSION:  05/30/2015 ADMITTING PHYSICIAN: Shaune PollackQing Chen, MD  DATE OF DISCHARGE: 06/05/2015   PRIMARY CARE PHYSICIAN: Bobbye MortonSHARON A REILLY, MD    ADMISSION DIAGNOSIS:  Hypoxia [R09.02] Sepsis, due to unspecified organism (HCC) [A41.9] Aspiration pneumonia of left lower lobe, unspecified aspiration pneumonia type (HCC) [J69.0]  DISCHARGE DIAGNOSIS:  Principal Problem:   Sepsis (HCC) Active Problems:   Hypoxia   Pressure ulcer  SECONDARY DIAGNOSIS:   Past Medical History  Diagnosis Date  . Chronic renal failure, stage 4 (severe) (HCC)   . Hypertension   . CHF (congestive heart failure) (HCC)   . Gout   . Glaucoma   . Neuropathy (HCC)   . Stroke (HCC)   . Peripheral vascular disease (HCC)   . Coronary artery disease   . A-fib (HCC)   . Dysrhythmia   . Blindness of right eye   . Anemia     iron deficiency    HOSPITAL COURSE:  81 y.o. male with a known history of hypertension, ESRD on hemodialysis, A. fib and CHF. The patient was sent to ED from nursing home due to fever, cough and weakness. The patient's general condition has been declining for the past one month. He cannot walk at all for the past one month. He has AV fistula malfunction. The patient was found hypoxia with O2 saturation 85% without oxygen in the ED. In addition he had fever 103 and tachycardia at about 130. Chest x-ray showed left base pneumonia. Please see Dr Nicky Pughhen's dictated H & P for further details.   Patient was slowly improving with broad spectrum IV Abx. Was getting inpt HD per nephrology as scheduled. He is close to his baseline, has remained afebrile with leukocytosis resolved. He was evaluated by PT and recommended STR/SNF where he's being D/C in stable condition. He is agreeable with D/C plans. Family is agreeable (I've d/w his daughter Sunny SchleinFelicia who is in  agreement), DISCHARGE CONDITIONS:   stable  CONSULTS OBTAINED:  Treatment Team:  Mosetta PigeonHarmeet Singh, MD Marcina MillardAlexander Paraschos, MD Clydie Braunavid Fitzgerald, MD  DRUG ALLERGIES:  No Known Allergies  DISCHARGE MEDICATIONS:   Current Discharge Medication List    START taking these medications   Details  amoxicillin-clavulanate (AUGMENTIN) 875-125 MG tablet Take 1 tablet by mouth 2 (two) times daily. Qty: 4 tablet, Refills: 0      CONTINUE these medications which have CHANGED   Details  HYDROcodone-acetaminophen (NORCO/VICODIN) 5-325 MG tablet Take 1 tablet by mouth every 6 (six) hours as needed for moderate pain. Qty: 5 tablet, Refills: 0      CONTINUE these medications which have NOT CHANGED   Details  acetaminophen (TYLENOL) 325 MG tablet Take 650 mg by mouth every 6 (six) hours as needed. For pain.    allopurinol (ZYLOPRIM) 100 MG tablet Take 100 mg by mouth daily.    aspirin EC 81 MG tablet Take 81 mg by mouth daily.    atorvastatin (LIPITOR) 20 MG tablet Take 1 tablet (20 mg total) by mouth daily. Qty: 30 tablet, Refills: 0    b complex-vitamin c-folic acid (NEPHRO-VITE) 0.8 MG TABS tablet Take 1 tablet by mouth daily.    brimonidine (ALPHAGAN) 0.2 % ophthalmic solution Place 1 drop into the left eye 2 (two) times daily.    clopidogrel (PLAVIX) 75 MG tablet Take 75 mg by mouth daily.  diltiazem (CARDIZEM CD) 180 MG 24 hr capsule Take 1 capsule (180 mg total) by mouth daily. Qty: 30 capsule, Refills: 0    ferrous sulfate 325 (65 FE) MG EC tablet Take 325 mg by mouth daily.    furosemide (LASIX) 40 MG tablet Take 1 tablet (40 mg total) by mouth 2 (two) times daily. Qty: 30 tablet, Refills: 0    isosorbide mononitrate (IMDUR) 60 MG 24 hr tablet Take 60 mg by mouth daily.    latanoprost (XALATAN) 0.005 % ophthalmic solution Place 1 drop into both eyes at bedtime.    lidocaine (LIDODERM) 5 % Place 1 patch onto the skin daily. Remove & Discard patch within 12 hours or as  directed by MD    metoprolol (TOPROL-XL) 200 MG 24 hr tablet Take 200 mg by mouth daily.    miconazole (MICOTIN) 2 % cream Apply 1 application topically daily. Clean and apply to groin    pregabalin (LYRICA) 75 MG capsule Take 75 mg by mouth daily.     senna-docusate (SENOKOT-S) 8.6-50 MG per tablet Take 2 tablets by mouth at bedtime.    sodium bicarbonate 650 MG tablet Take 650 mg by mouth 2 (two) times daily.    tolnaftate (TINACTIN) 1 % spray Apply 1 spray topically daily. To feet.    Vitamins A & D (VITAMIN A & D) ointment Apply 1 application topically daily. To feet.    warfarin (COUMADIN) 5 MG tablet Take 1 tablet (5 mg total) by mouth daily at 6 PM. Qty: 30 tablet, Refills: 0      STOP taking these medications     amoxicillin-clavulanate (AUGMENTIN) 500-125 MG tablet         DISCHARGE INSTRUCTIONS:    DIET:  Regular diet  DISCHARGE CONDITION:  Good  ACTIVITY:  Activity as tolerated  OXYGEN:  Home Oxygen: No.   Oxygen Delivery: room air  DISCHARGE LOCATION:  nursing home   If you experience worsening of your admission symptoms, develop shortness of breath, life threatening emergency, suicidal or homicidal thoughts you must seek medical attention immediately by calling 911 or calling your MD immediately  if symptoms less severe.  You Must read complete instructions/literature along with all the possible adverse reactions/side effects for all the Medicines you take and that have been prescribed to you. Take any new Medicines after you have completely understood and accpet all the possible adverse reactions/side effects.   Please note  You were cared for by a hospitalist during your hospital stay. If you have any questions about your discharge medications or the care you received while you were in the hospital after you are discharged, you can call the unit and asked to speak with the hospitalist on call if the hospitalist that took care of you is not  available. Once you are discharged, your primary care physician will handle any further medical issues. Please note that NO REFILLS for any discharge medications will be authorized once you are discharged, as it is imperative that you return to your primary care physician (or establish a relationship with a primary care physician if you do not have one) for your aftercare needs so that they can reassess your need for medications and monitor your lab values.    On the day of Discharge:   VITAL SIGNS:  Blood pressure 116/68, pulse 99, temperature 98.3 F (36.8 C), temperature source Oral, resp. rate 16, height  (1.854 m), weight 102.3 kg (225 lb 8.5 oz), SpO2 95 %. PHYSICAL  EXAMINATION:  GENERAL:  81 y.o.-year-old patient lying in the bed with no acute distress.  EYES: Pupils equal, round, reactive to light and accommodation. No scleral icterus. Extraocular muscles intact.  HEENT: Head atraumatic, normocephalic. Oropharynx and nasopharynx clear.  NECK:  Supple, no jugular venous distention. No thyroid enlargement, no tenderness.  LUNGS: Normal breath sounds bilaterally, no wheezing, rales,rhonchi or crepitation. No use of accessory muscles of respiration.  CARDIOVASCULAR: S1, S2 normal. No murmurs, rubs, or gallops.  ABDOMEN: Soft, non-tender, non-distended. Bowel sounds present. No organomegaly or mass.  EXTREMITIES: No pedal edema, cyanosis, or clubbing.  NEUROLOGIC: Cranial nerves II through XII are intact. Muscle strength 5/5 in all extremities. Sensation intact. Gait not checked.  PSYCHIATRIC: The patient is alert and oriented x 3.  SKIN: No obvious rash, lesion, or ulcer.  DATA REVIEW:   CBC  Recent Labs Lab 06/04/15 0453  WBC 9.9  HGB 9.5*  HCT 30.7*  PLT 257    Chemistries   Recent Labs Lab 05/30/15 0911  06/04/15 0453  NA 144  < > 140  K 3.9  < > 4.1  CL 102  < > 99*  CO2 30  < > 30  GLUCOSE 163*  < > 114*  BUN 51*  < > 25*  CREATININE 4.48*  < > 2.92*   CALCIUM 8.6*  < > 8.4*  AST 27  --   --   ALT 18  --   --   ALKPHOS 99  --   --   BILITOT 1.1  --   --   < > = values in this interval not displayed.  Cardiac Enzymes  Recent Labs Lab 05/30/15 0911  TROPONINI 0.05*    Microbiology Results  Results for orders placed or performed during the hospital encounter of 05/30/15  Blood Culture (routine x 2)     Status: None   Collection Time: 05/30/15  9:11 AM  Result Value Ref Range Status   Specimen Description BLOOD LEFT ASSIST CONTROL  Final   Special Requests   Final    BOTTLES DRAWN AEROBIC AND ANAEROBIC  2CC AERO 1CC ANAERO   Culture NO GROWTH 5 DAYS  Final   Report Status 06/04/2015 FINAL  Final  Blood Culture (routine x 2)     Status: None   Collection Time: 05/30/15  9:11 AM  Result Value Ref Range Status   Specimen Description BLOOD LEFT FATTY CASTS  Final   Special Requests   Final    BOTTLES DRAWN AEROBIC AND ANAEROBIC  1CC ANAERO 2CC AERO   Culture NO GROWTH 5 DAYS  Final   Report Status 06/04/2015 FINAL  Final  Urine culture     Status: None   Collection Time: 05/30/15  9:40 AM  Result Value Ref Range Status   Specimen Description URINE, RANDOM  Final   Special Requests NONE  Final   Culture NO GROWTH 2 DAYS  Final   Report Status 06/01/2015 FINAL  Final  MRSA PCR Screening     Status: Abnormal   Collection Time: 05/30/15  1:43 PM  Result Value Ref Range Status   MRSA by PCR POSITIVE (A) NEGATIVE Final    Comment:        The GeneXpert MRSA Assay (FDA approved for NASAL specimens only), is one component of a comprehensive MRSA colonization surveillance program. It is not intended to diagnose MRSA infection nor to guide or monitor treatment for MRSA infections. CRITICAL RESULT CALLED TO, READ BACK BY AND  VERIFIED WITH:  BRANDY MANSFIELD AT 1506 05/30/15 SDR     RADIOLOGY:  Dg Chest 2 View  06/04/2015  CLINICAL DATA:  Short of breath, dialysis patient EXAM: CHEST  2 VIEW COMPARISON:  05/30/2015  FINDINGS: RIGHT hemi dialysis catheter unchanged. Stable enlarged cardiac silhouette. No effusion, infiltrate, pneumothorax. Degenerative osteophytosis of the thoracic spine. IMPRESSION: Cardiomegaly without acute cardiopulmonary findings. Electronically Signed   By: Genevive Bi M.D.   On: 06/04/2015 10:09     Management plans discussed with the patient, family and they are in agreement.  CODE STATUS: Full Code  TOTAL TIME TAKING CARE OF THIS PATIENT: 55 minutes.    [facility], Micholas Drumwright M.D on 06/05/2015 at 2:22 PM  Between 7am to 6pm - Pager - [phone]  After 6pm go to [email or web address] - password [email or web address] [facility]  [facility]  Office  [phone]  CC: Primary care physician; Bobbye Morton, MD

## 2015-06-05 NOTE — Clinical Social Work Note (Deleted)
Joseph at [employer] informed CSW that they had not heard back from [employer] but that the caseworker assigned at [employer] is: Jessica: [phone] ext: [phone]. CSW contacted Jessica and left a message to see where the auth stood. Jessica returned CSW call and stated that she had not yet received the case and that the case according to their computer system was not called in until 10:30 this morning. Jessica further stated that she needed clinical so this CSW faxed clinical immediately to her attention for review. Jessica stated that it was doubtful that we would hear anything back today. CSW contacted Joseph at [facility] to see if they would reconsider taking patient pending auth. Amitai Delaughter MSW,LCSW [phone] 

## 2015-06-05 NOTE — Care Management (Signed)
[facility].  CSW following disposition.  [facility] aware of admission

## 2015-06-05 NOTE — Progress Notes (Signed)
Subjective:  Patient last had dialysis on Saturday. Overall feeling better since admission.    Objective:  Vital signs in last 24 hours:  Temp:  [98.2 F (36.8 C)-98.3 F (36.8 C)] 98.3 F (36.8 C) (11/28 1154) Pulse Rate:  [79-109] 99 (11/28 1154) Resp:  [16-23] 16 (11/28 1154) BP: (116-135)/(68-72) 116/68 mmHg (11/28 1154) SpO2:  [94 %-95 %] 95 % (11/28 1154)  Weight change:  Filed Weights   05/31/15 1541 05/31/15 1617 06/03/15 1900  Weight: 101.6 kg (223 lb 15.8 oz) 101.152 kg (223 lb) 102.3 kg (225 lb 8.5 oz)     Intake/Output:    Intake/Output Summary (Last 24 hours) at 06/05/15 1439 Last data filed at 06/04/15 1700  Gross per 24 hour  Intake      0 ml  Output      0 ml  Net      0 ml     Physical Exam: General: NAD, laying in bed  HEENT Moist oral mucus membranes, decreased hearing  Neck supple  Pulm/lungs Basilar rales, normal effort  CVS/Heart Irregular, no rubs  Abdomen:  Soft, NTND, BS present  Extremities: Trace b/l LE edema  Neurologic: Awake, alert, following commands  Skin: No acute rashes  Access: R IJ dialysis catheter       Basic Metabolic Panel:   Recent Labs Lab 05/30/15 0911 05/31/15 0436 06/01/15 0714 06/03/15 0500 06/04/15 0453  NA 144 145 140 139 140  K 3.9 3.8 3.9 4.1 4.1  CL 102 104 98* 97* 99*  CO2 30 30 33* 32 30  GLUCOSE 163* 108* 145* 100* 114*  BUN 51* 59* 30* 49* 25*  CREATININE 4.48* 4.53* 3.14* 4.34* 2.92*  CALCIUM 8.6* 8.2* 8.0* 8.3* 8.4*  PHOS  --   --   --  4.0  --      CBC:  Recent Labs Lab 05/30/15 0911 05/31/15 0436 06/01/15 0714 06/03/15 0500 06/04/15 0453  WBC 16.3* 15.1* 12.0* 8.4 9.9  NEUTROABS 14.6*  --   --   --   --   HGB 9.9* 8.4* 8.5* 8.7* 9.5*  HCT 32.4* 26.8* 26.9* 28.0* 30.7*  MCV 81.7 82.3 81.5 80.6 81.2  PLT 319 259 245 278 257      Microbiology:  Recent Results (from the past 720 hour(s))  Blood Culture (routine x 2)     Status: None   Collection Time: 05/30/15  9:11  AM  Result Value Ref Range Status   Specimen Description BLOOD LEFT ASSIST CONTROL  Final   Special Requests   Final    BOTTLES DRAWN AEROBIC AND ANAEROBIC  2CC AERO 1CC ANAERO   Culture NO GROWTH 5 DAYS  Final   Report Status 06/04/2015 FINAL  Final  Blood Culture (routine x 2)     Status: None   Collection Time: 05/30/15  9:11 AM  Result Value Ref Range Status   Specimen Description BLOOD LEFT FATTY CASTS  Final   Special Requests   Final    BOTTLES DRAWN AEROBIC AND ANAEROBIC  1CC ANAERO 2CC AERO   Culture NO GROWTH 5 DAYS  Final   Report Status 06/04/2015 FINAL  Final  Urine culture     Status: None   Collection Time: 05/30/15  9:40 AM  Result Value Ref Range Status   Specimen Description URINE, RANDOM  Final   Special Requests NONE  Final   Culture NO GROWTH 2 DAYS  Final   Report Status 06/01/2015 FINAL  Final  MRSA PCR  Screening     Status: Abnormal   Collection Time: 05/30/15  1:43 PM  Result Value Ref Range Status   MRSA by PCR POSITIVE (A) NEGATIVE Final    Comment:        The GeneXpert MRSA Assay (FDA approved for NASAL specimens only), is one component of a comprehensive MRSA colonization surveillance program. It is not intended to diagnose MRSA infection nor to guide or monitor treatment for MRSA infections. CRITICAL RESULT CALLED TO, READ BACK BY AND VERIFIED WITH:  BRANDY MANSFIELD AT 1506 05/30/15 SDR     Coagulation Studies: No results for input(s): LABPROT, INR in the last 72 hours.  Urinalysis: No results for input(s): COLORURINE, LABSPEC, PHURINE, GLUCOSEU, HGBUR, BILIRUBINUR, KETONESUR, PROTEINUR, UROBILINOGEN, NITRITE, LEUKOCYTESUR in the last 72 hours.  Invalid input(s): APPERANCEUR    Imaging: Dg Chest 2 View  06/04/2015  CLINICAL DATA:  Short of breath, dialysis patient EXAM: CHEST  2 VIEW COMPARISON:  05/30/2015 FINDINGS: RIGHT hemi dialysis catheter unchanged. Stable enlarged cardiac silhouette. No effusion, infiltrate, pneumothorax.  Degenerative osteophytosis of the thoracic spine. IMPRESSION: Cardiomegaly without acute cardiopulmonary findings. Electronically Signed   By: Genevive Bi M.D.   On: 06/04/2015 10:09     Medications:     . allopurinol  100 mg Oral Daily  . aspirin EC  81 mg Oral Daily  . atorvastatin  20 mg Oral Daily  . brimonidine  1 drop Left Eye BID  . clopidogrel  75 mg Oral Daily  . diltiazem  240 mg Oral Daily  . epoetin (EPOGEN/PROCRIT) injection  10,000 Units Intravenous Q T,Th,Sa-HD  . feeding supplement (NEPRO CARB STEADY)  237 mL Oral Q1500  . ferrous sulfate  325 mg Oral Daily  . furosemide  40 mg Oral BID  . heparin  5,000 Units Subcutaneous 3 times per day  . isosorbide mononitrate  60 mg Oral Daily  . latanoprost  1 drop Both Eyes QHS  . metoprolol  200 mg Oral Daily  . piperacillin-tazobactam (ZOSYN)  IV  3.375 g Intravenous Q12H  . pregabalin  75 mg Oral Daily  . senna-docusate  2 tablet Oral QHS  . sodium bicarbonate  650 mg Oral BID  . [START ON 06/06/2015] vancomycin  750 mg Intravenous Q T,Th,Sa-HD     Assessment/ Plan:  81 y.o.[place] male with right eye blindness, diastolic congestive heart failure, hypertension, gout, glaucoma, peripheral neuropathy, atrial fibrillation, anemia, carotid stenosis status post bilateral CEA, CVA, peripheral vascular disease who was admitted to [facility] on 04/08/2015 for shortness of breath.   Follows with [facility]/ [facility]/ TTS  1. End Stage Renal Disease: patient started on hemodialysis 10/6. - patient last had dialysis on Saturday.  No acute indication for dialysis today.  We will prepare orders for tomorrow.  2. Anemia of chronic kidney disease:  emoglobin up to 9.5.  Continue Epogen 10,000 units IV with dialysis.   3. Secondary Hyperparathyroidism:  -followup phosphorus with next dialysis treatment.  4.  Left lower lobe bacterial pneumonia - patient currently remains on Zosyn and vancomycin for this  issue.  LOS: 6 Polly Barner 11/28/20162:39 PM

## 2015-06-05 NOTE — Care Management Important Message (Signed)
Important Message  Patient Details  Name: Sean BoutonHolt Rodden Jr. MRN: [id number] Date of Birth: 02/29/1934   Medicare Important Message Given:  Yes    Chapman FitchBOWEN, Brantlee Hinde T, RN 06/05/2015, 10:22 AM

## 2015-06-05 NOTE — Discharge Instructions (Signed)

## 2015-06-05 NOTE — Clinical Social Work Note (Signed)
[facility] and [facility] is also aware. [facility]. CSW contacted [facility] and was informed by April to send patient via EMS. CSW contacted patient's niece: Orson Slicknora Burnette to make her aware of discharge and she verbalized understanding. Message left for patient's daughter, Chapman MossFelicia Moore. York SpanielMonica Zaya Kessenich MSW,LCSW [phone]

## 2015-06-20 ENCOUNTER — Other Ambulatory Visit: Payer: Self-pay | Admitting: Vascular Surgery

## 2015-06-21 ENCOUNTER — Other Ambulatory Visit: Payer: Medicare (Managed Care)

## 2015-06-23 ENCOUNTER — Encounter
Admission: RE | Admit: 2015-06-23 | Discharge: 2015-06-23 | Disposition: A | Discharge status: Home / Self Care | Payer: Medicare (Managed Care) | Source: Ambulatory Visit | Attending: Vascular Surgery | Admitting: Vascular Surgery

## 2015-06-23 DIAGNOSIS — Z01812 Encounter for preprocedural laboratory examination: Secondary | ICD-10-CM | POA: Diagnosis present

## 2015-06-23 LAB — PROTIME-INR
INR: 1.06
Prothrombin Time: 14 seconds (ref 11.4–15.0)

## 2015-06-23 LAB — TYPE AND SCREEN
ABO/RH(D): A POS
Antibody Screen: NEGATIVE

## 2015-06-23 LAB — SURGICAL PCR SCREEN
MRSA, PCR: NEGATIVE
STAPHYLOCOCCUS AUREUS: NEGATIVE

## 2015-06-23 LAB — APTT: aPTT: 29 seconds (ref 24–36)

## 2015-06-23 NOTE — Patient Instructions (Addendum)
  Your procedure is scheduled on: Thursday 06/29/2015 Report to Day Surgery. 2ND FLOOR MEDICAL MALL ENTRANCE To find out your arrival time please call [phone] between 1PM - 3PM on Wednesday 06/28/2015.  Remember: Instructions that are not followed completely may result in serious medical risk, up to and including death, or upon the discretion of your surgeon and anesthesiologist your surgery may need to be rescheduled.    __X__ 1. Do not eat food or drink liquids after midnight. No gum chewing or hard candies.     __X__ 2. No Alcohol for 24 hours before or after surgery.   ____ 3. Bring all medications with you on the day of surgery if instructed.    __X__ 4. Notify your doctor if there is any change in your medical condition     (cold, fever, infections).     Do not wear jewelry, make-up, hairpins, clips or nail polish.  Do not wear lotions, powders, or perfumes.  Do not shave 48 hours prior to surgery. Men may shave face and neck.  Do not bring valuables to the hospital.    [facility] is not responsible for any belongings or valuables.               Contacts, dentures or bridgework may not be worn into surgery.  Leave your suitcase in the car. After surgery it may be brought to your room.  For patients admitted to the hospital, discharge time is determined by your                treatment team.   Patients discharged the day of surgery will not be allowed to drive home.   Please read over the following fact sheets that you were given:   MRSA Information and Surgical Site Infection Prevention   __X__ Take these medicines the morning of surgery with A SIP OF WATER:    1. IMDUR  2. TOPROL XL  3. CARDIZEM  4.  5.  6.  ____ Fleet Enema (as directed)   __X__ Use CHG Soap as directed  SAGE WIPES FOR TWO NIGHTS PRIOR TO PROCEDURE  ____ Use inhalers on the day of surgery  ____ Stop metformin 2 days prior to surgery    ____ Take 1/2 of usual insulin dose the night before  surgery and none on the morning of surgery.   ____ Stop Coumadin/Plavix/aspirin on CONTINUE PLAVIX  ____ Stop Anti-inflammatories on    ____ Stop supplements until after surgery.    ____ Bring C-Pap to the hospital.

## 2015-06-24 ENCOUNTER — Observation Stay
Admission: EM | Admit: 2015-06-24 | Discharge: 2015-06-26 | Disposition: A | Discharge status: Skilled Nursing Facility | Payer: Medicare (Managed Care) | Attending: Internal Medicine | Admitting: Internal Medicine

## 2015-06-24 ENCOUNTER — Encounter: Payer: Self-pay | Admitting: *Deleted

## 2015-06-24 ENCOUNTER — Emergency Department: Payer: Medicare (Managed Care)

## 2015-06-24 DIAGNOSIS — R05 Cough: Secondary | ICD-10-CM | POA: Diagnosis not present

## 2015-06-24 DIAGNOSIS — I2510 Atherosclerotic heart disease of native coronary artery without angina pectoris: Secondary | ICD-10-CM | POA: Insufficient documentation

## 2015-06-24 DIAGNOSIS — X58XXXA Exposure to other specified factors, initial encounter: Secondary | ICD-10-CM | POA: Diagnosis not present

## 2015-06-24 DIAGNOSIS — R0902 Hypoxemia: Secondary | ICD-10-CM | POA: Insufficient documentation

## 2015-06-24 DIAGNOSIS — I132 Hypertensive heart and chronic kidney disease with heart failure and with stage 5 chronic kidney disease, or end stage renal disease: Secondary | ICD-10-CM | POA: Insufficient documentation

## 2015-06-24 DIAGNOSIS — M25551 Pain in right hip: Secondary | ICD-10-CM | POA: Insufficient documentation

## 2015-06-24 DIAGNOSIS — I739 Peripheral vascular disease, unspecified: Secondary | ICD-10-CM | POA: Diagnosis not present

## 2015-06-24 DIAGNOSIS — G629 Polyneuropathy, unspecified: Secondary | ICD-10-CM | POA: Insufficient documentation

## 2015-06-24 DIAGNOSIS — Z87891 Personal history of nicotine dependence: Secondary | ICD-10-CM | POA: Diagnosis not present

## 2015-06-24 DIAGNOSIS — M109 Gout, unspecified: Secondary | ICD-10-CM | POA: Diagnosis not present

## 2015-06-24 DIAGNOSIS — Z7901 Long term (current) use of anticoagulants: Secondary | ICD-10-CM | POA: Insufficient documentation

## 2015-06-24 DIAGNOSIS — J9811 Atelectasis: Secondary | ICD-10-CM | POA: Diagnosis not present

## 2015-06-24 DIAGNOSIS — Z833 Family history of diabetes mellitus: Secondary | ICD-10-CM | POA: Insufficient documentation

## 2015-06-24 DIAGNOSIS — Z992 Dependence on renal dialysis: Secondary | ICD-10-CM | POA: Diagnosis not present

## 2015-06-24 DIAGNOSIS — H5441 Blindness, right eye, normal vision left eye: Secondary | ICD-10-CM | POA: Diagnosis not present

## 2015-06-24 DIAGNOSIS — H409 Unspecified glaucoma: Secondary | ICD-10-CM | POA: Insufficient documentation

## 2015-06-24 DIAGNOSIS — I482 Chronic atrial fibrillation, unspecified: Secondary | ICD-10-CM

## 2015-06-24 DIAGNOSIS — T8241XA Breakdown (mechanical) of vascular dialysis catheter, initial encounter: Secondary | ICD-10-CM | POA: Diagnosis present

## 2015-06-24 DIAGNOSIS — I509 Heart failure, unspecified: Secondary | ICD-10-CM | POA: Diagnosis not present

## 2015-06-24 DIAGNOSIS — R509 Fever, unspecified: Secondary | ICD-10-CM | POA: Diagnosis not present

## 2015-06-24 DIAGNOSIS — Z7982 Long term (current) use of aspirin: Secondary | ICD-10-CM | POA: Insufficient documentation

## 2015-06-24 DIAGNOSIS — I517 Cardiomegaly: Secondary | ICD-10-CM | POA: Diagnosis not present

## 2015-06-24 DIAGNOSIS — I6523 Occlusion and stenosis of bilateral carotid arteries: Secondary | ICD-10-CM | POA: Diagnosis not present

## 2015-06-24 DIAGNOSIS — Z7902 Long term (current) use of antithrombotics/antiplatelets: Secondary | ICD-10-CM | POA: Diagnosis not present

## 2015-06-24 DIAGNOSIS — Z79899 Other long term (current) drug therapy: Secondary | ICD-10-CM | POA: Insufficient documentation

## 2015-06-24 DIAGNOSIS — T82898A Other specified complication of vascular prosthetic devices, implants and grafts, initial encounter: Secondary | ICD-10-CM | POA: Diagnosis not present

## 2015-06-24 DIAGNOSIS — D631 Anemia in chronic kidney disease: Secondary | ICD-10-CM | POA: Insufficient documentation

## 2015-06-24 DIAGNOSIS — N186 End stage renal disease: Secondary | ICD-10-CM | POA: Diagnosis not present

## 2015-06-24 DIAGNOSIS — Z8673 Personal history of transient ischemic attack (TIA), and cerebral infarction without residual deficits: Secondary | ICD-10-CM | POA: Insufficient documentation

## 2015-06-24 DIAGNOSIS — R918 Other nonspecific abnormal finding of lung field: Secondary | ICD-10-CM | POA: Diagnosis not present

## 2015-06-24 HISTORY — DX: End stage renal disease: N18.6

## 2015-06-24 LAB — APTT: aPTT: <24 seconds — ABNORMAL LOW (ref 24–36)

## 2015-06-24 LAB — BASIC METABOLIC PANEL
ANION GAP: 8 (ref 5–15)
BUN: 34 mg/dL — ABNORMAL HIGH (ref 6–20)
CALCIUM: 8.8 mg/dL — AB (ref 8.9–10.3)
CO2: 30 mmol/L (ref 22–32)
Chloride: 101 mmol/L (ref 101–111)
Creatinine, Ser: 3.08 mg/dL — ABNORMAL HIGH (ref 0.61–1.24)
GFR, EST AFRICAN AMERICAN: 21 mL/min — AB (ref >60)
GFR, EST NON AFRICAN AMERICAN: 18 mL/min — AB (ref >60)
GLUCOSE: 131 mg/dL — AB (ref 65–99)
Potassium: 3.6 mmol/L (ref 3.5–5.1)
Sodium: 139 mmol/L (ref 135–145)

## 2015-06-24 LAB — CBC
HEMATOCRIT: 33.9 % — AB (ref 40.0–52.0)
HEMOGLOBIN: 10.2 g/dL — AB (ref 13.0–18.0)
MCH: 24.6 pg — AB (ref 26.0–34.0)
MCHC: 30 g/dL — ABNORMAL LOW (ref 32.0–36.0)
MCV: 82 fL (ref 80.0–100.0)
Platelets: 281 10*3/uL (ref 150–440)
RBC: 4.14 MIL/uL — AB (ref 4.40–5.90)
RDW: 22.5 % — ABNORMAL HIGH (ref 11.5–14.5)
WBC: 8.8 10*3/uL (ref 3.8–10.6)

## 2015-06-24 LAB — PROTIME-INR
INR: 1.07
PROTHROMBIN TIME: 14.1 s (ref 11.4–15.0)

## 2015-06-24 LAB — MRSA PCR SCREENING: MRSA BY PCR: NEGATIVE

## 2015-06-24 MED ORDER — SENNOSIDES-DOCUSATE SODIUM 8.6-50 MG PO TABS
2.0000 | ORAL_TABLET | Freq: Every day | ORAL | Status: DC
  Administered 2015-06-24 – 2015-06-26 (×3): 2 via ORAL
  Filled 2015-06-24 (×3): qty 2

## 2015-06-24 MED ORDER — ATORVASTATIN CALCIUM 20 MG PO TABS
20.0000 mg | ORAL_TABLET | Freq: Every day | ORAL | Status: DC
Start: 2015-06-24 — End: 2015-06-27
  Administered 2015-06-25 – 2015-06-26 (×2): 20 mg via ORAL
  Filled 2015-06-24 (×2): qty 1

## 2015-06-24 MED ORDER — ACETAMINOPHEN 325 MG PO TABS: 650.0000 mg | ORAL_TABLET | Freq: Four times a day (QID) | ORAL | Status: DC | PRN

## 2015-06-24 MED ORDER — DILTIAZEM HCL ER COATED BEADS 180 MG PO CP24
180.0000 mg | ORAL_CAPSULE | Freq: Every day | ORAL | Status: DC
  Administered 2015-06-25 – 2015-06-26 (×2): 180 mg via ORAL
  Filled 2015-06-24 (×2): qty 1

## 2015-06-24 MED ORDER — PREGABALIN 75 MG PO CAPS
75.0000 mg | ORAL_CAPSULE | Freq: Every day | ORAL | Status: DC
Start: 2015-06-24 — End: 2015-06-27
  Administered 2015-06-25 – 2015-06-26 (×2): 75 mg via ORAL
  Filled 2015-06-24 (×2): qty 1

## 2015-06-24 MED ORDER — TOLNAFTATE 1 % EX POWD
Freq: Two times a day (BID) | CUTANEOUS | Status: DC
  Administered 2015-06-24 – 2015-06-26 (×5): via TOPICAL
  Filled 2015-06-24: qty 45

## 2015-06-24 MED ORDER — LATANOPROST 0.005 % OP SOLN
1.0000 [drp] | Freq: Every day | OPHTHALMIC | Status: DC
  Administered 2015-06-24 – 2015-06-26 (×3): 1 [drp] via OPHTHALMIC
  Filled 2015-06-24: qty 2.5

## 2015-06-24 MED ORDER — ALLOPURINOL 100 MG PO TABS
100.0000 mg | ORAL_TABLET | Freq: Every day | ORAL | Status: DC
  Administered 2015-06-25 – 2015-06-26 (×2): 100 mg via ORAL
  Filled 2015-06-24 (×2): qty 1

## 2015-06-24 MED ORDER — FUROSEMIDE 40 MG PO TABS
40.0000 mg | ORAL_TABLET | Freq: Two times a day (BID) | ORAL | Status: DC
  Administered 2015-06-25 – 2015-06-26 (×3): 40 mg via ORAL
  Filled 2015-06-24 (×3): qty 1

## 2015-06-24 MED ORDER — ASPIRIN EC 81 MG PO TBEC
81.0000 mg | DELAYED_RELEASE_TABLET | Freq: Every day | ORAL | Status: DC
  Administered 2015-06-25 – 2015-06-26 (×2): 81 mg via ORAL
  Filled 2015-06-24 (×2): qty 1

## 2015-06-24 MED ORDER — FERROUS SULFATE 325 (65 FE) MG PO TABS
325.0000 mg | ORAL_TABLET | Freq: Every day | ORAL | Status: DC
  Administered 2015-06-25 – 2015-06-26 (×2): 325 mg via ORAL
  Filled 2015-06-24 (×2): qty 1

## 2015-06-24 MED ORDER — VITAMINS A & D EX OINT
TOPICAL_OINTMENT | Freq: Every day | CUTANEOUS | Status: DC
  Administered 2015-06-25 – 2015-06-26 (×2): via TOPICAL

## 2015-06-24 MED ORDER — SODIUM BICARBONATE 650 MG PO TABS: 650.0000 mg | ORAL_TABLET | Freq: Two times a day (BID) | ORAL | Status: DC

## 2015-06-24 MED ORDER — HEPARIN SODIUM (PORCINE) 5000 UNIT/ML IJ SOLN
5000.0000 [IU] | Freq: Two times a day (BID) | INTRAMUSCULAR | Status: DC
  Administered 2015-06-24 – 2015-06-25 (×3): 5000 [IU] via SUBCUTANEOUS
  Filled 2015-06-24 (×3): qty 1

## 2015-06-24 MED ORDER — LIDOCAINE 5 % EX PTCH
1.0000 | MEDICATED_PATCH | Freq: Every day | CUTANEOUS | Status: DC
  Administered 2015-06-25 – 2015-06-26 (×2): 1 via TRANSDERMAL
  Filled 2015-06-24 (×3): qty 1

## 2015-06-24 MED ORDER — MICONAZOLE NITRATE 2 % EX CREA
1.0000 "application " | TOPICAL_CREAM | Freq: Every day | CUTANEOUS | Status: DC
  Administered 2015-06-24 – 2015-06-26 (×3): 1 via TOPICAL
  Filled 2015-06-24: qty 14

## 2015-06-24 MED ORDER — BRIMONIDINE TARTRATE 0.2 % OP SOLN
1.0000 [drp] | Freq: Two times a day (BID) | OPHTHALMIC | Status: DC
  Administered 2015-06-24 – 2015-06-26 (×5): 1 [drp] via OPHTHALMIC
  Filled 2015-06-24: qty 5

## 2015-06-24 MED ORDER — ONDANSETRON HCL 4 MG/2ML IJ SOLN: 4.0000 mg | Freq: Four times a day (QID) | INTRAMUSCULAR | Status: DC | PRN

## 2015-06-24 MED ORDER — ACETAMINOPHEN 650 MG RE SUPP: 650.0000 mg | Freq: Four times a day (QID) | RECTAL | Status: DC | PRN

## 2015-06-24 MED ORDER — FERROUS SULFATE 325 (65 FE) MG PO TBEC: 325.0000 mg | DELAYED_RELEASE_TABLET | Freq: Every day | ORAL | Status: DC

## 2015-06-24 MED ORDER — METOPROLOL SUCCINATE ER 100 MG PO TB24
200.0000 mg | ORAL_TABLET | Freq: Every day | ORAL | Status: DC
  Administered 2015-06-25 – 2015-06-26 (×2): 200 mg via ORAL
  Filled 2015-06-24 (×2): qty 2

## 2015-06-24 MED ORDER — SODIUM CHLORIDE 0.9 % IJ SOLN
3.0000 mL | INTRAMUSCULAR | Status: DC | PRN
  Administered 2015-06-25: 3 mL via INTRAVENOUS
  Filled 2015-06-24: qty 10

## 2015-06-24 MED ORDER — HYDROCODONE-ACETAMINOPHEN 5-325 MG PO TABS
1.0000 | ORAL_TABLET | Freq: Four times a day (QID) | ORAL | Status: DC | PRN
  Administered 2015-06-24 – 2015-06-26 (×2): 1 via ORAL
  Filled 2015-06-24 (×2): qty 1

## 2015-06-24 MED ORDER — ISOSORBIDE MONONITRATE ER 30 MG PO TB24
60.0000 mg | ORAL_TABLET | Freq: Every day | ORAL | Status: DC
  Administered 2015-06-25 – 2015-06-26 (×2): 60 mg via ORAL
  Filled 2015-06-24 (×2): qty 2

## 2015-06-24 MED ORDER — RENA-VITE PO TABS
1.0000 | ORAL_TABLET | Freq: Every day | ORAL | Status: DC
  Administered 2015-06-24 – 2015-06-26 (×3): 1 via ORAL
  Filled 2015-06-24 (×3): qty 1

## 2015-06-24 MED ORDER — ONDANSETRON HCL 4 MG PO TABS: 4.0000 mg | ORAL_TABLET | Freq: Four times a day (QID) | ORAL | Status: DC | PRN

## 2015-06-24 MED ORDER — VITAMINS A & D EX OINT
1.0000 "application " | TOPICAL_OINTMENT | Freq: Every day | CUTANEOUS | Status: DC
  Administered 2015-06-24: 1 via TOPICAL
  Filled 2015-06-24 (×2): qty 5

## 2015-06-24 MED ORDER — TOLNAFTATE 1 % EX AERP: 1.0000 | INHALATION_SPRAY | Freq: Every day | CUTANEOUS | Status: DC

## 2015-06-24 MED ORDER — SODIUM CHLORIDE 0.9 % IV SOLN: 250.0000 mL | INTRAVENOUS | Status: DC | PRN

## 2015-06-24 MED ORDER — SODIUM CHLORIDE 0.9 % IJ SOLN
3.0000 mL | Freq: Two times a day (BID) | INTRAMUSCULAR | Status: DC
  Administered 2015-06-24 – 2015-06-26 (×4): 3 mL via INTRAVENOUS

## 2015-06-24 NOTE — Progress Notes (Signed)
Heparin 5000 units subcutaneous q 8 hours was ordered for DVT prophylaxis.   Order was changed to heparin 5000 units q 12 hours due to the patient being on hemodialysis per anticoagulation protocol.   Jodelle RedMary M Nelson Julson 4:10 PM

## 2015-06-24 NOTE — Progress Notes (Signed)
[facility]  ROUNDING NOTE   Subjective:   Patient seems to have pulled out his tunnelled hemodialysis catheter. No hemodialysis today.   Objective:  Vital signs in last 24 hours:  Temp:  [97.9 F (36.6 C)] 97.9 F (36.6 C) (12/17 1129) Pulse Rate:  [87-95] 87 (12/17 1542) Resp:  [16-20] 17 (12/17 1542) BP: (109-131)/(68-75) 131/68 mmHg (12/17 1542) SpO2:  [91 %-96 %] 96 % (12/17 1542) Weight:  [108.863 kg (240 lb)] 108.863 kg (240 lb) (12/17 1129)  Weight change:  Filed Weights   06/24/15 1129  Weight: 108.863 kg (240 lb)    Intake/Output:     Intake/Output this shift:     Physical Exam: General: NAD  Head: Normocephalic, atraumatic. Moist oral mucosal membranes  Eyes: Anicteric, PERRL  Neck: Supple, trachea midline  Lungs:  Clear to auscultation  Heart: Regular rate and rhythm  Abdomen:  Soft, nontender,   Extremities: no peripheral edema.  Neurologic: Nonfocal, moving all four extremities  Skin: No lesions  Access: none    Basic Metabolic Panel:  Recent Labs Lab 06/24/15 1301  NA 139  K 3.6  CL 101  CO2 30  GLUCOSE 131*  BUN 34*  CREATININE 3.08*  CALCIUM 8.8*    Liver Function Tests: No results for input(s): AST, ALT, ALKPHOS, BILITOT, PROT, ALBUMIN in the last 168 hours. No results for input(s): LIPASE, AMYLASE in the last 168 hours. No results for input(s): AMMONIA in the last 168 hours.  CBC:  Recent Labs Lab 06/24/15 1301  WBC 8.8  HGB 10.2*  HCT 33.9*  MCV 82.0  PLT 281    Cardiac Enzymes: No results for input(s): CKTOTAL, CKMB, CKMBINDEX, TROPONINI in the last 168 hours.  BNP: Invalid input(s): POCBNP  CBG: No results for input(s): GLUCAP in the last 168 hours.  Microbiology: Results for orders placed or performed during the hospital encounter of 06/23/15  Surgical pcr screen     Status: None   Collection Time: 06/23/15 11:52 AM  Result Value Ref Range Status   MRSA, PCR NEGATIVE NEGATIVE Final   Staphylococcus aureus NEGATIVE NEGATIVE Final    Comment:        The Xpert SA Assay (FDA approved for NASAL specimens in patients over 79 years of age), is one component of a comprehensive surveillance program.  Test performance has been validated by [facility] for patients greater than or equal to 666 year old. It is not intended to diagnose infection nor to guide or monitor treatment.     Coagulation Studies:  Recent Labs  06/23/15 1152 06/24/15 1301  LABPROT 14.0 14.1  INR 1.06 1.07    Urinalysis: No results for input(s): COLORURINE, LABSPEC, PHURINE, GLUCOSEU, HGBUR, BILIRUBINUR, KETONESUR, PROTEINUR, UROBILINOGEN, NITRITE, LEUKOCYTESUR in the last 72 hours.  Invalid input(s): APPERANCEUR    Imaging: Dg Chest Portable 1 View  06/24/2015  CLINICAL DATA:  81 year old male with retracted dialysis catheter. EXAM: PORTABLE CHEST 1 VIEW COMPARISON:  06/04/2015 FINDINGS: The right-sided dialysis catheter has been retracted with tip in the region of the right subclavian vein. Cardiomegaly is again noted. There is no evidence of focal airspace disease, pulmonary edema, suspicious pulmonary nodule/mass, pleural effusion, or pneumothorax. No acute bony abnormalities are identified. IMPRESSION: Retracted right dialysis catheter with tip in the region of the right subclavian vein. Electronically Signed   By: Harmon PierJeffrey  Hu M.D.   On: 06/24/2015 13:07     Medications:     . [START ON 06/25/2015] allopurinol  100 mg  Oral Daily  . aspirin EC  81 mg Oral Daily  . atorvastatin  20 mg Oral Daily  . brimonidine  1 drop Left Eye BID  . diltiazem  180 mg Oral Daily  . [START ON 06/25/2015] ferrous sulfate  325 mg Oral Q breakfast  . furosemide  40 mg Oral BID  . heparin  5,000 Units Subcutaneous Q12H  . [START ON 06/25/2015] isosorbide mononitrate  60 mg Oral Daily  . latanoprost  1 drop Both Eyes QHS  . lidocaine  1 patch Transdermal Daily  . metoprolol  200 mg Oral Daily  .  miconazole  1 application Topical Daily  . multivitamin  1 tablet Oral QHS  . pregabalin  75 mg Oral Daily  . senna-docusate  2 tablet Oral QHS  . sodium bicarbonate  650 mg Oral BID  . sodium chloride  3 mL Intravenous Q12H  . tolnaftate   Topical BID  . vitamin A & D  1 application Topical Daily   sodium chloride, acetaminophen **OR** acetaminophen, HYDROcodone-acetaminophen, ondansetron **OR** ondansetron (ZOFRAN) IV, sodium chloride  Assessment/ Plan:  [name]. is a 81 y.o. black  male with right eye blindness, diastolic congestive heart failure, hypertension, gout, glaucoma, peripheral neuropathy, atrial fibrillation, anemia, carotid stenosis status post bilateral CEA, CVA, peripheral vascular disease who was admitted to [facility] on 06/24/2015  [facility] Nephrology/ [facility]/ TTS  1. End Stage Renal Disease: missed dialysis today due to complicated dialysis access. Cuff is completely out.  - Discussed case with Dr. Gilda Crease, new tunneled catheter for Monday.  - No acute indication for dialysis currently. Will monitor closely for dialysis need. - no indication to continue sodium bicarbonate, will discontinue   2. Anemia of chronic kidney disease: Hemoglobin 10.2 - Micera as outpatient. - Fe sulfate   3. Secondary Hyperparathyroidism:  - currently not on a binder.   4. Hypertension: blood pressure at goal.  - diltiazem, furosemide, imdur, metoprolol,    LOS: 0 Rodnesha Elie 12/17/20167:05 PM

## 2015-06-24 NOTE — ED Notes (Signed)
MD Patel at bedside.

## 2015-06-24 NOTE — ED Notes (Signed)
Lab called regarding add on co-ags, will add on at this time.

## 2015-06-24 NOTE — ED Notes (Signed)
MD Stafford at bedside. 

## 2015-06-24 NOTE — ED Provider Notes (Signed)
[facility] Emergency Department Provider Note  ____________________________________________  Time seen: 12:15 PM  I have reviewed the triage vital signs and the nursing notes.   HISTORY  Chief Complaint Vascular Access Problem    HPI Sean BoutonHolt Sowles Jr. is a 81 y.o. male who sent to the ED from his dialysis clinic due to a dislodged access catheter. The patient has a right IJ tunneled catheter through which she normally receives his dialysis. He goes Tuesday Thursday Saturday. He reports that he last went to days ago on Thursday. However, records sent from the dialysis center suggest that his last dialysis session was 5 days ago. Patient denies any chest pain shortness of breath or fever.     Past Medical History  Diagnosis Date  . Chronic renal failure, stage 4 (severe) (HCC)   . Hypertension   . CHF (congestive heart failure) (HCC)   . Gout   . Glaucoma   . Neuropathy (HCC)   . Stroke (HCC)   . Peripheral vascular disease (HCC)   . Coronary artery disease   . A-fib (HCC)   . Dysrhythmia   . Blindness of right eye   . Anemia     iron deficiency     Patient Active Problem List   Diagnosis Date Noted  . Pressure ulcer 05/31/2015  . Sepsis (HCC) 05/30/2015  . Hypoxia 05/30/2015  . Acute respiratory failure (HCC) 04/08/2015  . CHF (congestive heart failure) (HCC) 03/04/2015     Past Surgical History  Procedure Laterality Date  . Vascular surgery Bilateral     Carotid Endarterectomy  . Eye surgery Left     Cataract Extraction  . Av fistula placement Left 04/06/2015    Procedure: ARTERIOVENOUS (AV) FISTULA CREATION;  Surgeon: Annice NeedyJason S Dew, MD;  Location: [facility] ORS;  Service: Vascular;  Laterality: Left;  . Peripheral vascular catheterization N/A 04/12/2015    Procedure: Dialysis/Perma Catheter Insertion;  Surgeon: Annice NeedyJason S Dew, MD;  Location: [facility] INVASIVE CV LAB;  Service: Cardiovascular;  Laterality: N/A;  . Peripheral vascular catheterization  N/A 05/18/2015    Procedure: Dialysis/Perma Catheter Insertion;  Surgeon: Annice NeedyJason S Dew, MD;  Location: [facility] INVASIVE CV LAB;  Service: Cardiovascular;  Laterality: N/A;  . Peripheral vascular catheterization Left 05/29/2015    Procedure: A/V Shuntogram/Fistulagram;  Surgeon: Annice NeedyJason S Dew, MD;  Location: [facility] INVASIVE CV LAB;  Service: Cardiovascular;  Laterality: Left;  . Peripheral vascular catheterization N/A 05/29/2015    Procedure: A/V Shunt Intervention;  Surgeon: Annice NeedyJason S Dew, MD;  Location: [facility] INVASIVE CV LAB;  Service: Cardiovascular;  Laterality: N/A;     Current Outpatient Rx  Name  Route  Sig  Dispense  Refill  . acetaminophen (TYLENOL) 325 MG tablet   Oral   Take 650 mg by mouth every 6 (six) hours as needed. Reported on 06/23/2015         . allopurinol (ZYLOPRIM) 100 MG tablet   Oral   Take 100 mg by mouth daily.         Marland Kitchen. amoxicillin-clavulanate (AUGMENTIN) 875-125 MG tablet   Oral   Take 1 tablet by mouth 2 (two) times daily.   4 tablet   0   . aspirin EC 81 MG tablet   Oral   Take 81 mg by mouth daily. Reported on 06/23/2015         . atorvastatin (LIPITOR) 20 MG tablet   Oral   Take 1 tablet (20 mg total) by mouth daily.   30 tablet  0   . b complex-vitamin c-folic acid (NEPHRO-VITE) 0.8 MG TABS tablet   Oral   Take 1 tablet by mouth daily.         . brimonidine (ALPHAGAN) 0.2 % ophthalmic solution   Left Eye   Place 1 drop into the left eye 2 (two) times daily.         . clopidogrel (PLAVIX) 75 MG tablet   Oral   Take 75 mg by mouth daily.         Marland Kitchen diltiazem (CARDIZEM CD) 180 MG 24 hr capsule   Oral   Take 1 capsule (180 mg total) by mouth daily.   30 capsule   0   . ferrous sulfate 325 (65 FE) MG EC tablet   Oral   Take 325 mg by mouth daily.         . furosemide (LASIX) 40 MG tablet   Oral   Take 1 tablet (40 mg total) by mouth 2 (two) times daily.   30 tablet   0   . HYDROcodone-acetaminophen (NORCO/VICODIN) 5-325 MG  tablet   Oral   Take 1 tablet by mouth every 6 (six) hours as needed for moderate pain.   5 tablet   0   . isosorbide mononitrate (IMDUR) 60 MG 24 hr tablet   Oral   Take 60 mg by mouth daily.         Marland Kitchen latanoprost (XALATAN) 0.005 % ophthalmic solution   Both Eyes   Place 1 drop into both eyes at bedtime.         . lidocaine (LIDODERM) 5 %   Transdermal   Place 1 patch onto the skin daily. Remove & Discard patch within 12 hours or as directed by MD         . metoprolol (TOPROL-XL) 200 MG 24 hr tablet   Oral   Take 200 mg by mouth daily.         . miconazole (MICOTIN) 2 % cream   Topical   Apply 1 application topically daily. Clean and apply to groin         . pregabalin (LYRICA) 75 MG capsule   Oral   Take 75 mg by mouth daily.          Marland Kitchen senna-docusate (SENOKOT-S) 8.6-50 MG per tablet   Oral   Take 2 tablets by mouth at bedtime.         . sodium bicarbonate 650 MG tablet   Oral   Take 650 mg by mouth 2 (two) times daily.         Marland Kitchen tolnaftate (TINACTIN) 1 % spray   Topical   Apply 1 spray topically daily. To feet.         . Vitamins A & D (VITAMIN A & D) ointment   Topical   Apply 1 application topically daily. To feet.         . warfarin (COUMADIN) 5 MG tablet   Oral   Take 1 tablet (5 mg total) by mouth daily at 6 PM. Patient not taking: Reported on 05/30/2015   30 tablet   0      Allergies Review of patient's allergies indicates no known allergies.   Family History  Problem Relation Age of Onset  . Diabetes Mother     Social History Social History  Substance Use Topics  . Smoking status: Former Smoker -- 0.25 packs/day for 15 years    Types: Cigarettes    Quit  date: 09/06/2006  . Smokeless tobacco: Never Used  . Alcohol Use: No    Review of Systems  Constitutional:   No fever or chills. No weight changes Eyes:   No blurry vision or double vision.  ENT:   No sore throat. Cardiovascular:   No chest  pain. Respiratory:   No dyspnea or cough. Gastrointestinal:   Negative for abdominal pain, vomiting and diarrhea.  No BRBPR or melena. Genitourinary:   Negative for dysuria, urinary retention, bloody urine, or difficulty urinating. Musculoskeletal:   Negative for back pain. No joint swelling or pain. Skin:   Negative for rash. Neurological:   Negative for headaches, focal weakness or numbness. Psychiatric:  No anxiety or depression.   Endocrine:  No hot/cold intolerance, changes in energy, or sleep difficulty.  10-point ROS otherwise negative.  ____________________________________________   PHYSICAL EXAM:  VITAL SIGNS: ED Triage Vitals  Enc Vitals Group     BP 06/24/15 1129 128/74 mmHg     Pulse Rate 06/24/15 1129 92     Resp 06/24/15 1129 20     Temp 06/24/15 1129 97.9 F (36.6 C)     Temp Source 06/24/15 1129 Oral     SpO2 06/24/15 1129 94 %     Weight 06/24/15 1129 240 lb (108.863 kg)     Height 06/24/15 1129  (1.88 m)     Head Cir --      Peak Flow --      Pain Score 06/24/15 1132 0     Pain Loc --      Pain Edu? --      Excl. in GC? --      Constitutional:   Alert and oriented. Well appearing and in no distress. Eyes:   No scleral icterus. No conjunctival pallor. PERRL. EOMI ENT   Head:   Normocephalic and atraumatic.   Nose:   No congestion/rhinnorhea. No septal hematoma   Mouth/Throat:   MMM, no pharyngeal erythema. No peritonsillar mass. No uvula shift.   Neck:   No stridor. No SubQ emphysema. No meningismus. Hematological/Lymphatic/Immunilogical:   No cervical lymphadenopathy. Cardiovascular:   RRR. Normal and symmetric distal pulses are present in all extremities. No murmurs, rubs, or gallops. Right upper chest wall shows a tunneled right IJ catheter that has been retracted with approximately 16cm of catheter length newly exposed. The catheter is only palpable subcutaneously right under the insertion site through the skin, likely almost  entirely removed. Respiratory:   Normal respiratory effort without tachypnea nor retractions. Breath sounds are clear and equal bilaterally. No wheezes/rales/rhonchi. Gastrointestinal:   Soft and nontender. No distention. There is no CVA tenderness.  No rebound, rigidity, or guarding. Genitourinary:   deferred Musculoskeletal:   Nontender with normal range of motion in all extremities. No joint effusions.  No lower extremity tenderness.  No edema. Neurologic:   Normal speech and language.  CN 2-10 normal. Motor grossly intact. No pronator drift.  Normal gait. No gross focal neurologic deficits are appreciated.  Skin:    Skin is warm, dry and intact. No rash noted.  No petechiae, purpura, or bullae. Psychiatric:   Mood and affect are normal. Speech and behavior are normal. Patient exhibits appropriate insight and judgment.  ____________________________________________    LABS (pertinent positives/negatives) (all labs ordered are listed, but only abnormal results are displayed) Labs Reviewed  BASIC METABOLIC PANEL - Abnormal; Notable for the following:    Glucose, Bld 131 (*)    BUN 34 (*)  Creatinine, Ser 3.08 (*)    Calcium 8.8 (*)    GFR calc non Af Amer 18 (*)    GFR calc Af Amer 21 (*)    All other components within normal limits  CBC - Abnormal; Notable for the following:    RBC 4.14 (*)    Hemoglobin 10.2 (*)    HCT 33.9 (*)    MCH 24.6 (*)    MCHC 30.0 (*)    RDW 22.5 (*)    All other components within normal limits  PROTIME-INR  APTT   ____________________________________________   EKG  Interpreted by me Atrial fibrillation rate controlled with a rate of 105. Normal axis, normal intervals. Poor R-wave progression in anterior precordial leads. Normal ST segments and T waves. There are 3 PVCs on the strip.  ____________________________________________    RADIOLOGY  Chest x-ray shows clear lungs, right dialysis catheter has been retracted with the tip in the  region of the subclavian vein.  ____________________________________________   PROCEDURES   ____________________________________________   INITIAL IMPRESSION / ASSESSMENT AND PLAN / ED COURSE  Pertinent labs & imaging results that were available during my care of the patient were reviewed by me and considered in my medical decision making (see chart for details).  Patient presents with dialysis access issue. No acute complaints. No evidence of volume overload, but he is mildly tachycardic which may be due to his atrial fibrillation which would be appropriately rate controlled. There is a discrepancy between the records sent with the patient and his report as to when his last dialysis session was. It may have been 2 days ago, it may have been 5 days ago. We will check labs and plan to hospitalize for access management.  ----------------------------------------- 1:49 PM on 06/24/2015 -----------------------------------------  Labs are unremarkable, EKG and vitals are stable. I discussed the case with the on-call vascular interventionists who reports that they're unable to replace dialysis access lines this would have to be done by vascular surgery. I discussed the case with the hospitalist for hospitalization pending access replacement.   ____________________________________________   FINAL CLINICAL IMPRESSION(S) / ED DIAGNOSES  Final diagnoses:  Problem with dialysis access, initial encounter [facility])  Chronic atrial fibrillation (HCC)      Sharman Cheek, MD 06/24/15 1350

## 2015-06-24 NOTE — ED Notes (Addendum)
Pt to ED via EMS from [facility] after pt arrived this am for dialysis when staff noticed dialysis cath has come out. Upon arrival pt has cath still in place, approx. 12 in sticking out. No bleeding or drainage noted. Pt denies any pain at this time. Pt did not receive dialysis today due to this issue. Pt AAOx4, vitals wnl, no acute distress noted. Catheter secured by Tegaderm at this time.

## 2015-06-24 NOTE — ED Notes (Signed)
[facility] notified by this RN regarding pt's status. Verbalized understanding at this time.

## 2015-06-24 NOTE — H&P (Addendum)
[facility] [place] [facility]   PATIENT NAME: Sean Delgado Frei    MR#:  [id number]  DATE OF BIRTH:  15-Nov-1933  DATE OF ADMISSION:  06/24/2015  PRIMARY CARE PHYSICIAN: Bobbye MortonSHARON A REILLY, MD   REQUESTING/REFERRING PHYSICIAN: Sharman CheekPhillip Stafford M.D.  CHIEF COMPLAINT:   Chief Complaint  Patient presents with  . Vascular Access Problem    HISTORY OF PRESENT ILLNESS: Sean Delgado [place]t  is a 81 y.o. male with a known history of end-stage renal disease on hemodialysis, hypertension, congestive heart failure type unknown, gout, history of CVA, peripheral vascular disease, coronary artery disease, atrial fibrillation presents to the emergency room after being sent from hemodialysis center because his dialysis catheter came out partially. Therefore they sent him here. Patient has hemodialysis on Tuesday Thursday and Saturday. He was not able to get his hemodialysis. It is unclear when his last hemodialysis was. Patient otherwise denies any shortness of breath no chest pains or palpitations. He was recently hospitalized for aspiration pneumonia. PAST MEDICAL HISTORY:   Past Medical History  Diagnosis Date  . ESRD (end stage renal disease) (HCC)   . Hypertension   . CHF (congestive heart failure) (HCC)   . Gout   . Glaucoma   . Neuropathy (HCC)   . Stroke (HCC)   . Peripheral vascular disease (HCC)   . Coronary artery disease   . A-fib (HCC)   . Dysrhythmia   . Blindness of right eye   . Anemia     iron deficiency    PAST SURGICAL HISTORY:  Past Surgical History  Procedure Laterality Date  . Vascular surgery Bilateral     Carotid Endarterectomy  . Eye surgery Left     Cataract Extraction  . Av fistula placement Left 04/06/2015    Procedure: ARTERIOVENOUS (AV) FISTULA CREATION;  Surgeon: Annice NeedyJason S Dew, MD;  Location: [facility] ORS;  Service: Vascular;  Laterality: Left;  . Peripheral vascular catheterization N/A 04/12/2015    Procedure: Dialysis/Perma Catheter Insertion;   Surgeon: Annice NeedyJason S Dew, MD;  Location: [facility] INVASIVE CV LAB;  Service: Cardiovascular;  Laterality: N/A;  . Peripheral vascular catheterization N/A 05/18/2015    Procedure: Dialysis/Perma Catheter Insertion;  Surgeon: Annice NeedyJason S Dew, MD;  Location: [facility] INVASIVE CV LAB;  Service: Cardiovascular;  Laterality: N/A;  . Peripheral vascular catheterization Left 05/29/2015    Procedure: A/V Shuntogram/Fistulagram;  Surgeon: Annice NeedyJason S Dew, MD;  Location: [facility] INVASIVE CV LAB;  Service: Cardiovascular;  Laterality: Left;  . Peripheral vascular catheterization N/A 05/29/2015    Procedure: A/V Shunt Intervention;  Surgeon: Annice NeedyJason S Dew, MD;  Location: [facility] INVASIVE CV LAB;  Service: Cardiovascular;  Laterality: N/A;    SOCIAL HISTORY:  Social History  Substance Use Topics  . Smoking status: Former Smoker -- 0.25 packs/day for 15 years    Types: Cigarettes    Quit date: 09/06/2006  . Smokeless tobacco: Never Used  . Alcohol Use: No    FAMILY HISTORY:  Family History  Problem Relation Age of Onset  . Diabetes Mother     DRUG ALLERGIES: No Known Allergies  REVIEW OF SYSTEMS:   CONSTITUTIONAL: No fever, fatigue or weakness.  EYES: No blurred or double vision.  EARS, NOSE, AND THROAT: No tinnitus or ear pain.  RESPIRATORY: No cough, shortness of breath, wheezing or hemoptysis.  CARDIOVASCULAR: No chest pain, orthopnea, edema.  GASTROINTESTINAL: No nausea, vomiting, diarrhea or abdominal pain.  GENITOURINARY: No dysuria, hematuria.  ENDOCRINE: No polyuria, nocturia,  HEMATOLOGY: No anemia, easy bruising  or bleeding SKIN: No rash or lesion. MUSCULOSKELETAL: No joint pain or arthritis.   NEUROLOGIC: No tingling, numbness, weakness.  PSYCHIATRY: No anxiety or depression.   MEDICATIONS AT HOME:    Medication List    ASK your doctor about these medications        acetaminophen 325 MG tablet  Commonly known as:  TYLENOL  Take 650 mg by mouth every 6 (six) hours as needed. Reported on 06/23/2015      allopurinol 100 MG tablet  Commonly known as:  ZYLOPRIM  Take 100 mg by mouth daily.     amoxicillin-clavulanate 875-125 MG tablet  Commonly known as:  AUGMENTIN  Take 1 tablet by mouth 2 (two) times daily.     aspirin EC 81 MG tablet  Take 81 mg by mouth daily. Reported on 06/23/2015     atorvastatin 20 MG tablet  Commonly known as:  LIPITOR  Take 1 tablet (20 mg total) by mouth daily.     b complex-vitamin c-folic acid 0.8 MG Tabs tablet  Take 1 tablet by mouth daily.     brimonidine 0.2 % ophthalmic solution  Commonly known as:  ALPHAGAN  Place 1 drop into the left eye 2 (two) times daily.     clopidogrel 75 MG tablet  Commonly known as:  PLAVIX  Take 75 mg by mouth daily.     diltiazem 180 MG 24 hr capsule  Commonly known as:  CARDIZEM CD  Take 1 capsule (180 mg total) by mouth daily.     ferrous sulfate 325 (65 FE) MG EC tablet  Take 325 mg by mouth daily.     furosemide 40 MG tablet  Commonly known as:  LASIX  Take 1 tablet (40 mg total) by mouth 2 (two) times daily.     HYDROcodone-acetaminophen 5-325 MG tablet  Commonly known as:  NORCO/VICODIN  Take 1 tablet by mouth every 6 (six) hours as needed for moderate pain.     isosorbide mononitrate 60 MG 24 hr tablet  Commonly known as:  IMDUR  Take 60 mg by mouth daily.     latanoprost 0.005 % ophthalmic solution  Commonly known as:  XALATAN  Place 1 drop into both eyes at bedtime.     lidocaine 5 %  Commonly known as:  LIDODERM  Place 1 patch onto the skin daily. Remove & Discard patch within 12 hours or as directed by MD     metoprolol 200 MG 24 hr tablet  Commonly known as:  TOPROL-XL  Take 200 mg by mouth daily.     miconazole 2 % cream  Commonly known as:  MICOTIN  Apply 1 application topically daily. Clean and apply to groin     pregabalin 75 MG capsule  Commonly known as:  LYRICA  Take 75 mg by mouth daily.     senna-docusate 8.6-50 MG tablet  Commonly known as:  Senokot-S  Take 2  tablets by mouth at bedtime.     sodium bicarbonate 650 MG tablet  Take 650 mg by mouth 2 (two) times daily.     tolnaftate 1 % spray  Commonly known as:  TINACTIN  Apply 1 spray topically daily. To feet.     vitamin A & D ointment  Apply 1 application topically daily. To feet.     warfarin 5 MG tablet  Commonly known as:  COUMADIN  Take 1 tablet (5 mg total) by mouth daily at 6 PM.  PHYSICAL EXAMINATION:   VITAL SIGNS: Blood pressure 109/68, pulse 92, temperature 97.9 F (36.6 C), temperature source Oral, resp. rate 19, height 6\' 2"  (1.88 m), weight 108.863 kg (240 lb), SpO2 91 %.  GENERAL:  81 y.o.-year-old patient lying in the bed with chronically ill-appearing.  EYES: Pupils equal, round, reactive to light and accommodation. No scleral icterus. Extraocular muscles intact.  HEENT: Head atraumatic, normocephalic. Oropharynx and nasopharynx clear.  NECK:  Supple, no jugular venous distention. No thyroid enlargement, no tenderness.  LUNGS: Normal breath sounds bilaterally, no wheezing, rales,rhonchi or crepitation. No use of accessory muscles of respiration.  CARDIOVASCULAR: S1, S2 normal. No murmurs, rubs, or gallops.  ABDOMEN: Soft, nontender, nondistended. Bowel sounds present. No organomegaly or mass.  EXTREMITIES: No pedal edema, cyanosis, or clubbing.  NEUROLOGIC: Cranial nerves II through XII are intact. Muscle strength 5/5 in all extremities. Sensation intact. Gait not checked.  PSYCHIATRIC: The patient is alert and oriented x 3.  SKIN: No obvious rash, lesion, or ulcer.   LABORATORY PANEL:   CBC  Recent Labs Lab 06/24/15 1301  WBC 8.8  HGB 10.2*  HCT 33.9*  PLT 281  MCV 82.0  MCH 24.6*  MCHC 30.0*  RDW 22.5*   ------------------------------------------------------------------------------------------------------------------  Chemistries   Recent Labs Lab 06/24/15 1301  NA 139  K 3.6  CL 101  CO2 30  GLUCOSE 131*  BUN 34*   CREATININE 3.08*  CALCIUM 8.8*   ------------------------------------------------------------------------------------------------------------------ estimated creatinine clearance is 25.6 mL/min (by C-G formula based on Cr of 3.08). ------------------------------------------------------------------------------------------------------------------ No results for input(s): TSH, T4TOTAL, T3FREE, THYROIDAB in the last 72 hours.  Invalid input(s): FREET3   Coagulation profile  Recent Labs Lab 06/23/15 1152 06/24/15 1301  INR 1.06 1.07   ------------------------------------------------------------------------------------------------------------------- No results for input(s): DDIMER in the last 72 hours. -------------------------------------------------------------------------------------------------------------------  Cardiac Enzymes No results for input(s): CKMB, TROPONINI, MYOGLOBIN in the last 168 hours.  Invalid input(s): CK ------------------------------------------------------------------------------------------------------------------ Invalid input(s): POCBNP  ---------------------------------------------------------------------------------------------------------------  Urinalysis    Component Value Date/Time   Lavenia Atlas NEGATIVE 05/30/2015 0940     RADIOLOGY: Dg Chest Portable 1 View  06/24/2015  CLINICAL DATA:  81 year old male with retracted dialysis catheter. EXAM: PORTABLE CHEST 1 VIEW COMPARISON:  06/04/2015 FINDINGS: The right-sided dialysis catheter has been retracted with tip in the region of the right subclavian vein. Cardiomegaly is again noted. There is no evidence of focal airspace disease, pulmonary edema, suspicious pulmonary nodule/mass, pleural effusion, or pneumothorax. No acute bony abnormalities are identified. IMPRESSION: Retracted right dialysis catheter with tip in the region of the right subclavian vein. Electronically Signed   By: Harmon Pier M.D.    On: 06/24/2015 13:07    EKG: Orders placed or performed during the hospital encounter of 06/24/15  . ED EKG  . ED EKG    IMPRESSION AND PLAN: Patient is a 81 year old African-American male with end-stage renal disease is being admitted for loss of hemodialysis access.  1. Poor hemodialysis access: I will consult vascular surgery patient will need another hemodialysis. He is scheduled to have a fistula placement according to him.  2. Hypertension continue Toprol-XL.  3. Chronic atrial fibrillation continue diltiazem and metoprolol. Will hold Coumadin due to need for procedure. Can resume Coumadin postprocedure.  4. End-stage renal disease nephrology consult  5. History of CVA hold aspirin and Plavix for procedure for now.  6. Miscellaneous we'll do heparin for DVT prophylaxis for now.    All the records are reviewed and case discussed with ED provider. Management plans  discussed with the patient, family and they are in agreement.  CODE STATUS: Full    TOTAL TIME TAKING CARE OF THIS PATIENT: 55 minutes.    Auburn Bilberry M.D on 06/24/2015 at 2:50 PM  Between 7am to 6pm - Pager - [phone]  After 6pm go to [email or web address] - password [email or web address] [facility]  [facility]  Office  [phone]  CC: Primary care physician; Bobbye Morton, MD

## 2015-06-24 NOTE — Care Management Obs Status (Addendum)
MEDICARE OBSERVATION STATUS NOTIFICATION   Patient Details  Name: Sean BoutonHolt Mccoin Jr. MRN: [id number] Date of Birth: 08/16/1933   Medicare Observation Status Notification Given:  Yes Contact precautions copy left with patient.    Caren MacadamMichelle Kelvin Burpee, RN 06/24/2015, 6:43 PM

## 2015-06-25 LAB — CBC
HEMATOCRIT: 29.6 % — AB (ref 40.0–52.0)
Hemoglobin: 8.9 g/dL — ABNORMAL LOW (ref 13.0–18.0)
MCH: 24.4 pg — ABNORMAL LOW (ref 26.0–34.0)
MCHC: 30 g/dL — ABNORMAL LOW (ref 32.0–36.0)
MCV: 81.3 fL (ref 80.0–100.0)
PLATELETS: 243 10*3/uL (ref 150–440)
RBC: 3.64 MIL/uL — AB (ref 4.40–5.90)
RDW: 22 % — AB (ref 11.5–14.5)
WBC: 7.9 10*3/uL (ref 3.8–10.6)

## 2015-06-25 LAB — RENAL FUNCTION PANEL
ANION GAP: 7 (ref 5–15)
Albumin: 2.1 g/dL — ABNORMAL LOW (ref 3.5–5.0)
BUN: 39 mg/dL — AB (ref 6–20)
CALCIUM: 8.3 mg/dL — AB (ref 8.9–10.3)
CO2: 27 mmol/L (ref 22–32)
Chloride: 103 mmol/L (ref 101–111)
Creatinine, Ser: 3.07 mg/dL — ABNORMAL HIGH (ref 0.61–1.24)
GFR calc Af Amer: 21 mL/min — ABNORMAL LOW (ref >60)
GFR calc non Af Amer: 18 mL/min — ABNORMAL LOW (ref >60)
GLUCOSE: 90 mg/dL (ref 65–99)
POTASSIUM: 3.5 mmol/L (ref 3.5–5.1)
Phosphorus: 4.2 mg/dL (ref 2.5–4.6)
SODIUM: 137 mmol/L (ref 135–145)

## 2015-06-25 MED ORDER — SODIUM CHLORIDE 0.9 % IV SOLN
INTRAVENOUS | Status: DC
  Administered 2015-06-26: via INTRAVENOUS

## 2015-06-25 MED ORDER — CEFAZOLIN SODIUM 1-5 GM-% IV SOLN
1.0000 g | INTRAVENOUS | Status: DC
  Filled 2015-06-25: qty 50

## 2015-06-25 NOTE — Consult Note (Signed)
[facility] Vascular Consult Note  MRN : [id number]  Sean Delgado. is a 81 y.o. (Oct 03, 1933) male who presents with chief complaint of  Chief Complaint  Patient presents with  . Vascular Access Problem  .  History of Present Illness:  Sean Delgado is a 81 y.o. male with a known history of end-stage renal disease on hemodialysis, hypertension, congestive heart failure type unknown, gout, history of CVA, peripheral vascular disease, coronary artery disease, atrial fibrillation presents to the emergency room after being sent from hemodialysis center because his dialysis catheter came out partially. Patient has hemodialysis on Tuesday Thursday and Saturday. It is unclear when his last hemodialysis was. Patient otherwise denies any shortness of breath no chest pains or palpitations. He was recently hospitalized for aspiration pneumonia. He does not remember or know why his permcath has becomes dislodged.  Vascular surgery was consulted by primary team, Dr. Allena Katz for removal and further access recommendations.  Current Facility-Administered Medications  Medication Dose Route Frequency Provider Last Rate Last Dose  . 0.9 %  sodium chloride infusion  250 mL Intravenous PRN Auburn Bilberry, MD      . acetaminophen (TYLENOL) tablet 650 mg  650 mg Oral Q6H PRN Auburn Bilberry, MD       Or  . acetaminophen (TYLENOL) suppository 650 mg  650 mg Rectal Q6H PRN Auburn Bilberry, MD      . allopurinol (ZYLOPRIM) tablet 100 mg  100 mg Oral Daily Auburn Bilberry, MD   100 mg at 06/25/15 0918  . aspirin EC tablet 81 mg  81 mg Oral Daily Auburn Bilberry, MD   81 mg at 06/25/15 1324  . atorvastatin (LIPITOR) tablet 20 mg  20 mg Oral Daily Auburn Bilberry, MD   20 mg at 06/25/15 4010  . brimonidine (ALPHAGAN) 0.2 % ophthalmic solution 1 drop  1 drop Left Eye BID Auburn Bilberry, MD   1 drop at 06/25/15 0919  . diltiazem (CARDIZEM CD) 24 hr capsule 180 mg  180 mg Oral Daily Auburn Bilberry, MD    180 mg at 06/25/15 0918  . ferrous sulfate tablet 325 mg  325 mg Oral Q breakfast Cindi Carbon, RPH   325 mg at 06/25/15 2725  . furosemide (LASIX) tablet 40 mg  40 mg Oral BID Auburn Bilberry, MD   40 mg at 06/25/15 0918  . heparin injection 5,000 Units  5,000 Units Subcutaneous Q12H Auburn Bilberry, MD   5,000 Units at 06/25/15 [phone]  . HYDROcodone-acetaminophen (NORCO/VICODIN) 5-325 MG per tablet 1 tablet  1 tablet Oral Q6H PRN Auburn Bilberry, MD   1 tablet at 06/24/15 2135  . isosorbide mononitrate (IMDUR) 24 hr tablet 60 mg  60 mg Oral Daily Auburn Bilberry, MD   60 mg at 06/25/15 0917  . latanoprost (XALATAN) 0.005 % ophthalmic solution 1 drop  1 drop Both Eyes QHS Auburn Bilberry, MD   1 drop at 06/24/15 2221  . lidocaine (LIDODERM) 5 % 1 patch  1 patch Transdermal Daily Auburn Bilberry, MD   1 patch at 06/25/15 [phone]  . metoprolol succinate (TOPROL-XL) 24 hr tablet 200 mg  200 mg Oral Daily Auburn Bilberry, MD   200 mg at 06/25/15 0917  . miconazole (MICOTIN) 2 % cream 1 application  1 application Topical Daily Auburn Bilberry, MD   1 application at 06/25/15 [phone]  . multivitamin (RENA-VIT) tablet 1 tablet  1 tablet Oral QHS Auburn Bilberry, MD   1 tablet at 06/24/15 2135  . ondansetron (  ZOFRAN) tablet 4 mg  4 mg Oral Q6H PRN Auburn Bilberry, MD       Or  . ondansetron (ZOFRAN) injection 4 mg  4 mg Intravenous Q6H PRN Auburn Bilberry, MD      . pregabalin (LYRICA) capsule 75 mg  75 mg Oral Daily Auburn Bilberry, MD   75 mg at 06/25/15 0918  . senna-docusate (Senokot-S) tablet 2 tablet  2 tablet Oral QHS Auburn Bilberry, MD   2 tablet at 06/24/15 2135  . sodium chloride 0.9 % injection 3 mL  3 mL Intravenous Q12H Auburn Bilberry, MD   3 mL at 06/25/15 0921  . sodium chloride 0.9 % injection 3 mL  3 mL Intravenous PRN Auburn Bilberry, MD   3 mL at 06/25/15 0921  . tolnaftate (TINACTIN) 1 % powder   Topical BID Cindi Carbon, [facility]      . vitamin A & D ointment   Topical Daily Auburn Bilberry, MD        Past  Medical History  Diagnosis Date  . ESRD (end stage renal disease) (HCC)   . Hypertension   . CHF (congestive heart failure) (HCC)   . Gout   . Glaucoma   . Neuropathy (HCC)   . Stroke (HCC)   . Peripheral vascular disease (HCC)   . Coronary artery disease   . A-fib (HCC)   . Dysrhythmia   . Blindness of right eye   . Anemia     iron deficiency    Past Surgical History  Procedure Laterality Date  . Vascular surgery Bilateral     Carotid Endarterectomy  . Eye surgery Left     Cataract Extraction  . Av fistula placement Left 04/06/2015    Procedure: ARTERIOVENOUS (AV) FISTULA CREATION;  Surgeon: Annice Needy, MD;  Location: [facility] ORS;  Service: Vascular;  Laterality: Left;  . Peripheral vascular catheterization N/A 04/12/2015    Procedure: Dialysis/Perma Catheter Insertion;  Surgeon: Annice Needy, MD;  Location: [facility] INVASIVE CV LAB;  Service: Cardiovascular;  Laterality: N/A;  . Peripheral vascular catheterization N/A 05/18/2015    Procedure: Dialysis/Perma Catheter Insertion;  Surgeon: Annice Needy, MD;  Location: [facility] INVASIVE CV LAB;  Service: Cardiovascular;  Laterality: N/A;  . Peripheral vascular catheterization Left 05/29/2015    Procedure: A/V Shuntogram/Fistulagram;  Surgeon: Annice Needy, MD;  Location: [facility] INVASIVE CV LAB;  Service: Cardiovascular;  Laterality: Left;  . Peripheral vascular catheterization N/A 05/29/2015    Procedure: A/V Shunt Intervention;  Surgeon: Annice Needy, MD;  Location: [facility] INVASIVE CV LAB;  Service: Cardiovascular;  Laterality: N/A;    Social History Social History  Substance Use Topics  . Smoking status: Former Smoker -- 0.25 packs/day for 15 years    Types: Cigarettes    Quit date: 09/06/2006  . Smokeless tobacco: Never Used  . Alcohol Use: No    Family History Family History  Problem Relation Age of Onset  . Diabetes Mother   Denies any family history of bleeding disorders, PAD or autoimmune disorders.  No Known  Allergies   REVIEW OF SYSTEMS (Negative unless checked)  Constitutional: [] Weight loss  [] Fever  [] Chills Cardiac: [] Chest pain   [] Chest pressure   [] Palpitations   [] Shortness of breath when laying flat   [] Shortness of breath at rest   [] Shortness of breath with exertion. Vascular:  [] Pain in legs with walking   [] Pain in legs at rest   [] Pain in legs when laying flat   [] Claudication   []   Pain in feet when walking  [] Pain in feet at rest  [] Pain in feet when laying flat   [] History of DVT   [] Phlebitis   [] Swelling in legs   [] Varicose veins   [] Non-healing ulcers Pulmonary:   [] Uses home oxygen   [] Productive cough   [] Hemoptysis   [] Wheeze  [] COPD   [] Asthma Neurologic:  [] Dizziness  [] Blackouts   [] Seizures   [x] History of stroke   [x] History of TIA  [] Aphasia   [] Temporary blindness   [] Dysphagia   [x] Weakness or numbness in arms   [] Weakness or numbness in legs Musculoskeletal:  [] Arthritis   [] Joint swelling   [] Joint pain   [] Low back pain Hematologic:  [] Easy bruising  [] Easy bleeding   [] Hypercoagulable state   [] Anemic  [] Hepatitis Gastrointestinal:  [] Blood in stool   [] Vomiting blood  [] Gastroesophageal reflux/heartburn   [] Difficulty swallowing. Genitourinary:  [x] Chronic kidney disease   [] Difficult urination  [] Frequent urination  [] Burning with urination   [] Blood in urine Skin:  [] Rashes   [] Ulcers   [] Wounds Psychological:  [] History of anxiety   []  History of major depression.  Physical Examination  Filed Vitals:   06/24/15 1501 06/24/15 1542 06/24/15 2009 06/25/15 0445  BP: 113/75 131/68 128/67 123/75  Pulse: 95 87 57 119  Temp:   98 F (36.7 C) 98.6 F (37 C)  TempSrc:   Oral Oral  Resp: 16 17 20 20   Height:      Weight:      SpO2: 94% 96% 94% 93%   Body mass index is 30.8 kg/(m^2). Gen:  WD/WN, NAD Head: [place]/AT, No temporalis wasting. Prominent temp pulse not noted. Ear/Nose/Throat: Hearing grossly intact, nares w/o erythema or drainage, oropharynx w/o  Erythema/Exudate Eyes: Blindness Neck: Supple, no nuchal rigidity.  No bruit or JVD.  Chest: Right IJ permcath barely intact, track without any drainage, erythema, or infection noted. Majority of catheter hanging out of patients chest.  Pulmonary:  Good air movement, clear to auscultation bilaterally.  Cardiac: RRR, normal S1, S2, no Murmurs, rubs or gallops. Vascular:  Vessel Right Left  Radial Palpable Palpable  Ulnar Palpable Palpable  Brachial Palpable Palpable  Carotid Palpable, without bruit Palpable, without bruit  Aorta Not palpable N/A  Femoral Palpable Palpable  Popliteal Palpable Palpable  PT Palpable Palpable  DP Palpable Palpable   Gastrointestinal: soft, non-tender/non-distended. No guarding/reflex. No masses, surgical incisions, or scars. Musculoskeletal: M/S 5/5 throughout.  Extremities without ischemic changes.  No deformity or atrophy. No edema. Neurologic: CN 2-12 intact. Pain and light touch intact in extremities.  Symmetrical.  Speech is fluent. Motor exam as listed above. Psychiatric: Judgment intact, Mood & affect appropriate for pt's clinical situation. Dermatologic: No rashes or ulcers noted.  No cellulitis or open wounds. Lymph : No Cervical, Axillary, or Inguinal lymphadenopathy.  CBC Lab Results  Component Value Date   WBC 7.9 06/25/2015   HGB 8.9* 06/25/2015   HCT 29.6* 06/25/2015   MCV 81.3 06/25/2015   PLT 243 06/25/2015   BMET    Component Value Date/Time   NA 137 06/25/2015 0509   K 3.5 06/25/2015 0509   CL 103 06/25/2015 0509   CO2 27 06/25/2015 0509   GLUCOSE 90 06/25/2015 0509   BUN 39* 06/25/2015 0509   CREATININE 3.07* 06/25/2015 0509   CALCIUM 8.3* 06/25/2015 0509   GFRNONAA 18* 06/25/2015 0509   GFRAA 21* 06/25/2015 0509   Estimated Creatinine Clearance: 25.6 mL/min (by C-G formula based on Cr of 3.07).  COAG Lab  Results  Component Value Date   INR 1.07 06/24/2015   INR 1.06 06/23/2015   INR 1.27 05/30/2015    Radiology Dg Chest 2 View  06/04/2015  CLINICAL DATA:  Short of breath, dialysis patient EXAM: CHEST  2 VIEW COMPARISON:  05/30/2015 FINDINGS: RIGHT hemi dialysis catheter unchanged. Stable enlarged cardiac silhouette. No effusion, infiltrate, pneumothorax. Degenerative osteophytosis of the thoracic spine. IMPRESSION: Cardiomegaly without acute cardiopulmonary findings. Electronically Signed   By: Genevive Bi M.D.   On: 06/04/2015 10:09   Mr Hip Right Wo Contrast  06/02/2015  CLINICAL DATA:  The patient slipped off the seated his walker 3 weeks ago. Right hip pain and tenderness. Initial encounter. EXAM: MR OF THE RIGHT HIP WITHOUT CONTRAST TECHNIQUE: Multiplanar, multisequence MR imaging was performed. No intravenous contrast was administered. COMPARISON:  None. FINDINGS: Bones: There is no fracture or dislocation. No avascular necrosis of the femoral heads is seen. Marrow signal is unremarkable. Articular cartilage and labrum Articular cartilage:  Minimally degenerated. Labrum:  Unremarkable. Joint or bursal effusion Joint effusion:  None. Bursae:  Unremarkable. Muscles and tendons Muscles and tendons:  Intact. Other findings Miscellaneous:  None. IMPRESSION: Negative for fracture. No finding to explain the patient's symptoms. Mild degenerative change is seen about the hips. Electronically Signed   By: Drusilla Kanner M.D.   On: 06/02/2015 15:17   Dg Chest Portable 1 View  06/24/2015  CLINICAL DATA:  81 year old male with retracted dialysis catheter. EXAM: PORTABLE CHEST 1 VIEW COMPARISON:  06/04/2015 FINDINGS: The right-sided dialysis catheter has been retracted with tip in the region of the right subclavian vein. Cardiomegaly is again noted. There is no evidence of focal airspace disease, pulmonary edema, suspicious pulmonary nodule/mass, pleural effusion, or pneumothorax. No acute bony abnormalities are identified. IMPRESSION: Retracted right dialysis catheter with tip in the region of the  right subclavian vein. Electronically Signed   By: Harmon Pier M.D.   On: 06/24/2015 13:07   Dg Chest Port 1 View  05/30/2015  CLINICAL DATA:  Fever and atrial fibrillation EXAM: PORTABLE CHEST 1 VIEW COMPARISON:  May 29, 2015 FINDINGS: Central catheter tip is at the cavoatrial junction. No pneumothorax. There is increase in airspace consolidation in the left lower lobe with left effusion. Right lung is clear. Heart is mildly enlarged with pulmonary vascular within normal limits. There is atherosclerotic calcification in the aorta. No adenopathy appreciable. IMPRESSION: Increase in left lower lobe consolidation with left effusion. Suspect pneumonia left lower lobe. Right lung clear. Heart prominent but stable. No pneumothorax. Electronically Signed   By: Bretta Bang III M.D.   On: 05/30/2015 09:26   Dg Chest Port 1 View  05/29/2015  CLINICAL DATA:  Hypoxia postprocedure, congestion, cough EXAM: PORTABLE CHEST 1 VIEW COMPARISON:  Portable chest x-ray of 05/16/2015 FINDINGS: There is vague indistinctness of the left hemidiaphragm as noted previously and atelectasis and/or effusion cannot be excluded. Mild volume loss is noted at the right lung base. Cardiomegaly is stable and minimal pulmonary vascular congestion cannot be excluded. A large bore right central venous line is noted with tip at the expected SVC -RA junction. No pneumothorax is seen. IMPRESSION: 1. Bibasilar opacities left-greater-than-right most consistent with atelectasis. Cannot exclude small effusion at the left lung base. 2. Stable cardiomegaly. Question mild pulmonary vascular congestion. Electronically Signed   By: Dwyane Dee M.D.   On: 05/29/2015 12:24   Assessment/Plan 81 year old male with right IJ permcath who presents with catheter almost dislodged from chest. 1) Patient recently  had AV fistula creation which still needs time to mature. 2) Permcath easily removed from chest as it was barely intact. 3) Will schedule  patient for permcath placement tomorrow with Dr. Wyn Quaker as this is how he will be maintained until his fistula matures.    Yafet Cline A Ame Heagle, PA-C  06/25/2015 12:11 PM

## 2015-06-25 NOTE — Progress Notes (Signed)
[facility]  ROUNDING NOTE   Subjective:   Resting comfortably.  Last dialysis was Thursday.  Permcath to be exchanged tomorrow.   Objective:  Vital signs in last 24 hours:  Temp:  [97.9 F (36.6 C)-98.6 F (37 C)] 98.6 F (37 C) (12/18 0445) Pulse Rate:  [57-119] 119 (12/18 0445) Resp:  [16-20] 20 (12/18 0445) BP: (109-131)/(67-75) 123/75 mmHg (12/18 0445) SpO2:  [91 %-96 %] 93 % (12/18 0445) Weight:  [108.863 kg (240 lb)] 108.863 kg (240 lb) (12/17 1129)  Weight change:  Filed Weights   06/24/15 1129  Weight: 108.863 kg (240 lb)    Intake/Output: I/O last 3 completed shifts: In: 359 [P.O.:356; I.V.:3] Out: 0    Intake/Output this shift:     Physical Exam: General: NAD  Head: Normocephalic, atraumatic. Moist oral mucosal membranes  Eyes: Anicteric, PERRL  Neck: Supple, trachea midline  Lungs:  Clear to auscultation  Heart: Regular rate and rhythm  Abdomen:  Soft, nontender,   Extremities: no peripheral edema.  Neurologic: Nonfocal, moving all four extremities  Skin: No lesions  Access: None - IRIJ permcath is pulled out    Basic Metabolic Panel:  Recent Labs Lab 06/24/15 1301 06/25/15 0509  NA 139 137  K 3.6 3.5  CL 101 103  CO2 30 27  GLUCOSE 131* 90  BUN 34* 39*  CREATININE 3.08* 3.07*  CALCIUM 8.8* 8.3*  PHOS  --  4.2    Liver Function Tests:  Recent Labs Lab 06/25/15 0509  ALBUMIN 2.1*   No results for input(s): LIPASE, AMYLASE in the last 168 hours. No results for input(s): AMMONIA in the last 168 hours.  CBC:  Recent Labs Lab 06/24/15 1301 06/25/15 0509  WBC 8.8 7.9  HGB 10.2* 8.9*  HCT 33.9* 29.6*  MCV 82.0 81.3  PLT 281 243    Cardiac Enzymes: No results for input(s): CKTOTAL, CKMB, CKMBINDEX, TROPONINI in the last 168 hours.  BNP: Invalid input(s): POCBNP  CBG: No results for input(s): GLUCAP in the last 168 hours.  Microbiology: Results for orders placed or performed during the hospital  encounter of 06/24/15  MRSA PCR Screening     Status: None   Collection Time: 06/24/15  3:48 PM  Result Value Ref Range Status   MRSA by PCR NEGATIVE NEGATIVE Final    Comment:        The GeneXpert MRSA Assay (FDA approved for NASAL specimens only), is one component of a comprehensive MRSA colonization surveillance program. It is not intended to diagnose MRSA infection nor to guide or monitor treatment for MRSA infections.     Coagulation Studies:  Recent Labs  06/23/15 1152 06/24/15 1301  LABPROT 14.0 14.1  INR 1.06 1.07    Urinalysis: No results for input(s): COLORURINE, LABSPEC, PHURINE, GLUCOSEU, HGBUR, BILIRUBINUR, KETONESUR, PROTEINUR, UROBILINOGEN, NITRITE, LEUKOCYTESUR in the last 72 hours.  Invalid input(s): APPERANCEUR    Imaging: Dg Chest Portable 1 View  06/24/2015  CLINICAL DATA:  81 year old male with retracted dialysis catheter. EXAM: PORTABLE CHEST 1 VIEW COMPARISON:  06/04/2015 FINDINGS: The right-sided dialysis catheter has been retracted with tip in the region of the right subclavian vein. Cardiomegaly is again noted. There is no evidence of focal airspace disease, pulmonary edema, suspicious pulmonary nodule/mass, pleural effusion, or pneumothorax. No acute bony abnormalities are identified. IMPRESSION: Retracted right dialysis catheter with tip in the region of the right subclavian vein. Electronically Signed   By: Harmon Pier M.D.   On: 06/24/2015 13:07  Medications:     . allopurinol  100 mg Oral Daily  . aspirin EC  81 mg Oral Daily  . atorvastatin  20 mg Oral Daily  . brimonidine  1 drop Left Eye BID  . diltiazem  180 mg Oral Daily  . ferrous sulfate  325 mg Oral Q breakfast  . furosemide  40 mg Oral BID  . heparin  5,000 Units Subcutaneous Q12H  . isosorbide mononitrate  60 mg Oral Daily  . latanoprost  1 drop Both Eyes QHS  . lidocaine  1 patch Transdermal Daily  . metoprolol  200 mg Oral Daily  . miconazole  1 application Topical  Daily  . multivitamin  1 tablet Oral QHS  . pregabalin  75 mg Oral Daily  . senna-docusate  2 tablet Oral QHS  . sodium chloride  3 mL Intravenous Q12H  . tolnaftate   Topical BID  . vitamin A & D   Topical Daily   sodium chloride, acetaminophen **OR** acetaminophen, HYDROcodone-acetaminophen, ondansetron **OR** ondansetron (ZOFRAN) IV, sodium chloride  Assessment/ Plan:  Mr. Lowell BoutonHolt Mowrey Jr. is a 81 y.o. black  male with right eye blindness, diastolic congestive heart failure, hypertension, gout, glaucoma, peripheral neuropathy, atrial fibrillation, anemia, carotid stenosis status post bilateral CEA, CVA, peripheral vascular disease who [facility]  [facility]  1. End Stage Renal Disease: missed dialysis today due to complicated dialysis access. Cuff is completely out.  - Discussed case with Dr. Gilda CreaseSchnier, new tunneled catheter for Monday.  - No acute indication for dialysis currently. Will monitor closely for dialysis need. Potassium at goal.   2. Anemia of chronic kidney disease: Hemoglobin 8.9 - Micera as outpatient. - Fe sulfate   3. Secondary Hyperparathyroidism:  - currently not on a binder.   4. Hypertension: blood pressure at goal.  - diltiazem, furosemide, imdur, metoprolol   LOS: 1 Sean Delgado 12/18/201610:36 AM

## 2015-06-25 NOTE — Progress Notes (Signed)
[facility] [place] at [facility]   PATIENT NAME: Sean Delgado    MR#:  [id number]  DATE OF BIRTH:  [birth year]  SUBJECTIVE:  Came in after patient's PermCath was partially pulled out. Unable to perform hemodialysis. Patient does not know how that happened. He is doing well. No complaints.  REVIEW OF SYSTEMS:   Review of Systems  Constitutional: Negative for fever, chills and weight loss.  HENT: Negative for ear discharge, ear pain and nosebleeds.   Eyes: Negative for blurred vision, pain and discharge.  Respiratory: Negative for sputum production, shortness of breath, wheezing and stridor.   Cardiovascular: Negative for chest pain, palpitations, orthopnea and PND.  Gastrointestinal: Negative for nausea, vomiting, abdominal pain and diarrhea.  Genitourinary: Negative for urgency and frequency.  Musculoskeletal: Negative for back pain and joint pain.  Neurological: Negative for sensory change, speech change, focal weakness and weakness.  Psychiatric/Behavioral: Negative for depression and hallucinations. The patient is not nervous/anxious.    Tolerating Diet: Tolerating PT:   DRUG ALLERGIES:  No Known Allergies  VITALS:  Blood pressure 125/53, pulse 94, temperature 98.6 F (37 C), temperature source Oral, resp. rate 20, height  (1.88 m), weight 108.863 kg (240 lb), SpO2 90 %.  PHYSICAL EXAMINATION:   Physical Exam  GENERAL:  81 y.o.-year-old patient lying in the bed with no acute distress.  EYES: Pupils equal, round, reactive to light and accommodation. No scleral icterus. Extraocular muscles intact.  HEENT: Head atraumatic, normocephalic. Oropharynx and nasopharynx clear.  NECK:  Supple, no jugular venous distention. No thyroid enlargement, no tenderness. Right upper chest PermCath present. LUNGS: Normal breath sounds bilaterally, no wheezing, rales, rhonchi. No use of accessory muscles of respiration.  CARDIOVASCULAR: S1, S2 normal. No  murmurs, rubs, or gallops.  ABDOMEN: Soft, nontender, nondistended. Bowel sounds present. No organomegaly or mass.  EXTREMITIES: No cyanosis, clubbing or edema b/l.    NEUROLOGIC: Cranial nerves II through XII are intact. No focal Motor or sensory deficits b/l.   PSYCHIATRIC: The patient is alert and oriented x 3.  SKIN: No obvious rash, lesion, or ulcer.    LABORATORY PANEL:   CBC  Recent Labs Lab 06/25/15 0509  WBC 7.9  HGB 8.9*  HCT 29.6*  PLT 243    Chemistries   Recent Labs Lab 06/25/15 0509  NA 137  K 3.5  CL 103  CO2 27  GLUCOSE 90  BUN 39*  CREATININE 3.07*  CALCIUM 8.3*    Cardiac Enzymes No results for input(s): TROPONINI in the last 168 hours.  RADIOLOGY:  Dg Chest Portable 1 View  06/24/2015  CLINICAL DATA:  81 year old male with retracted dialysis catheter. EXAM: PORTABLE CHEST 1 VIEW COMPARISON:  06/04/2015 FINDINGS: The right-sided dialysis catheter has been retracted with tip in the region of the right subclavian vein. Cardiomegaly is again noted. There is no evidence of focal airspace disease, pulmonary edema, suspicious pulmonary nodule/mass, pleural effusion, or pneumothorax. No acute bony abnormalities are identified. IMPRESSION: Retracted right dialysis catheter with tip in the region of the right subclavian vein. Electronically Signed   By: Harmon Pier M.D.   On: 06/24/2015 13:07   ASSESSMENT AND PLAN:   81 year old African-American male with end-stage renal disease is being admitted for loss of hemodialysis access.  1. Poor hemodialysis access: Patient to get PermCath tomorrow. We'll resume dialysis thereafter.  2. Hypertension continue Toprol-XL.  3. Chronic atrial fibrillation continue diltiazem and metoprolol. Will hold Coumadin due to need for procedure. Can resume  Coumadin postprocedure.  4. End-stage renal disease nephrology consult  5. History of CVA hold aspirin and Plavix for procedure for now.  6. Miscellaneous we'll do  heparin for DVT prophylaxis for now.  Case discussed with Care Management/Social Worker. Management plans discussed with the patient, family and they are in agreement.  CODE STATUS: full  DVT Prophylaxis: heparin  TOTAL TIME TAKING CARE OF THIS PATIENT: 25 minutes.  >50% time spent on counselling and coordination of care  POSSIBLE D/C IN 1-2 DAYS, DEPENDING ON CLINICAL CONDITION.   Flecia Shutter M.D on 06/25/2015 at 12:56 PM  Between 7am to 6pm - Pager - [phone]  After 6pm go to [email or web address] - password [email or web address] [facility]  [facility]  Office  [phone]  CC: Primary care physician; Bobbye MortonSHARON A REILLY, MD

## 2015-06-26 ENCOUNTER — Encounter
Admission: EM | Disposition: A | Discharge status: Skilled Nursing Facility | Payer: Self-pay | Source: Home / Self Care | Attending: Internal Medicine

## 2015-06-26 HISTORY — PX: PERIPHERAL VASCULAR CATHETERIZATION: SHX172C

## 2015-06-26 LAB — BASIC METABOLIC PANEL
ANION GAP: 9 (ref 5–15)
BUN: 42 mg/dL — ABNORMAL HIGH (ref 6–20)
CALCIUM: 8.5 mg/dL — AB (ref 8.9–10.3)
CO2: 26 mmol/L (ref 22–32)
Chloride: 105 mmol/L (ref 101–111)
Creatinine, Ser: 2.99 mg/dL — ABNORMAL HIGH (ref 0.61–1.24)
GFR, EST AFRICAN AMERICAN: 21 mL/min — AB (ref >60)
GFR, EST NON AFRICAN AMERICAN: 18 mL/min — AB (ref >60)
Glucose, Bld: 88 mg/dL (ref 65–99)
Potassium: 3.7 mmol/L (ref 3.5–5.1)
SODIUM: 140 mmol/L (ref 135–145)

## 2015-06-26 LAB — CBC
HEMATOCRIT: 30.5 % — AB (ref 40.0–52.0)
Hemoglobin: 9.2 g/dL — ABNORMAL LOW (ref 13.0–18.0)
MCH: 24.4 pg — AB (ref 26.0–34.0)
MCHC: 30.3 g/dL — AB (ref 32.0–36.0)
MCV: 80.7 fL (ref 80.0–100.0)
Platelets: 302 10*3/uL (ref 150–440)
RBC: 3.78 MIL/uL — ABNORMAL LOW (ref 4.40–5.90)
RDW: 21.9 % — AB (ref 11.5–14.5)
WBC: 5.4 10*3/uL (ref 3.8–10.6)

## 2015-06-26 SURGERY — DIALYSIS/PERMA CATHETER INSERTION
Anesthesia: Moderate Sedation | Wound class: Clean

## 2015-06-26 MED ORDER — FENTANYL CITRATE (PF) 100 MCG/2ML IJ SOLN
INTRAMUSCULAR | Status: DC | PRN
  Administered 2015-06-26: 25 ug via INTRAVENOUS

## 2015-06-26 MED ORDER — MIDAZOLAM HCL 5 MG/5ML IJ SOLN
INTRAMUSCULAR | Status: AC
  Filled 2015-06-26: qty 5

## 2015-06-26 MED ORDER — HEPARIN (PORCINE) IN NACL 2-0.9 UNIT/ML-% IJ SOLN
INTRAMUSCULAR | Status: AC
  Filled 2015-06-26: qty 500

## 2015-06-26 MED ORDER — LIDOCAINE-EPINEPHRINE (PF) 1 %-1:200000 IJ SOLN
INTRAMUSCULAR | Status: AC
  Filled 2015-06-26: qty 30

## 2015-06-26 MED ORDER — FENTANYL CITRATE (PF) 100 MCG/2ML IJ SOLN
INTRAMUSCULAR | Status: AC
  Filled 2015-06-26: qty 2

## 2015-06-26 MED ORDER — MIDAZOLAM HCL 2 MG/2ML IJ SOLN
INTRAMUSCULAR | Status: DC | PRN
  Administered 2015-06-26: 1 mg via INTRAVENOUS

## 2015-06-26 MED ORDER — HEPARIN SODIUM (PORCINE) 10000 UNIT/ML IJ SOLN
INTRAMUSCULAR | Status: AC
  Filled 2015-06-26: qty 1

## 2015-06-26 MED ORDER — SODIUM CHLORIDE 0.9 % IV SOLN
INTRAVENOUS | Status: DC
  Administered 2015-06-26: 11:00:00 via INTRAVENOUS

## 2015-06-26 MED ORDER — CHLORHEXIDINE GLUCONATE CLOTH 2 % EX PADS
6.0000 | MEDICATED_PAD | Freq: Once | CUTANEOUS | Status: AC
  Administered 2015-06-26: 6 via TOPICAL

## 2015-06-26 SURGICAL SUPPLY — 10 items
BIOPATCH WHT 1IN DISK W/4.0 H (GAUZE/BANDAGES/DRESSINGS) ×3 IMPLANT
CATH PALINDROME RT-P 15FX28CM (CATHETERS) ×3 IMPLANT
DERMABOND ADVANCED (GAUZE/BANDAGES/DRESSINGS) ×2
DERMABOND ADVANCED .7 DNX12 (GAUZE/BANDAGES/DRESSINGS) ×1 IMPLANT
PACK ANGIOGRAPHY (CUSTOM PROCEDURE TRAY) ×3 IMPLANT
SUT MNCRL 4-0 (SUTURE) ×2
SUT MNCRL 4-0 27XMFL (SUTURE) ×1
SUT PROLENE 0 CT 1 30 (SUTURE) ×3 IMPLANT
SUTURE MNCRL 4-0 27XMF (SUTURE) ×1 IMPLANT
TOWEL OR 17X26 4PK STRL BLUE (TOWEL DISPOSABLE) ×3 IMPLANT

## 2015-06-26 NOTE — Progress Notes (Signed)
[facility] - [place] at [facility]   PATIENT NAME: Sean KussmaulHolt Delgado    MR#:  [id number]  DATE OF BIRTH:  25-Jul-1933  SUBJECTIVE:  Came in after patient's PermCath was partially pulled out. Unable to perform hemodialysis. Patient does not know how that happened. He is doing well. No complaints. dter in the room  REVIEW OF SYSTEMS:   Review of Systems  Constitutional: Negative for fever, chills and weight loss.  HENT: Negative for ear discharge, ear pain and nosebleeds.   Eyes: Negative for blurred vision, pain and discharge.  Respiratory: Negative for sputum production, shortness of breath, wheezing and stridor.   Cardiovascular: Negative for chest pain, palpitations, orthopnea and PND.  Gastrointestinal: Negative for nausea, vomiting, abdominal pain and diarrhea.  Genitourinary: Negative for urgency and frequency.  Musculoskeletal: Negative for back pain and joint pain.  Neurological: Negative for sensory change, speech change, focal weakness and weakness.  Psychiatric/Behavioral: Negative for depression and hallucinations. The patient is not nervous/anxious.    Tolerating Diet:npo Tolerating PT: bedbound  DRUG ALLERGIES:  No Known Allergies  VITALS:  Blood pressure 139/54, pulse 99, temperature 98.2 F (36.8 C), temperature source Oral, resp. rate 18, height 6\' 2"  (1.88 m), weight 108.863 kg (240 lb), SpO2 93 %.  PHYSICAL EXAMINATION:   Physical Exam  GENERAL:  81 y.o.-year-old patient lying in the bed with no acute distress.  EYES: Pupils equal, round, reactive to light and accommodation. No scleral icterus. Extraocular muscles intact.  HEENT: Head atraumatic, normocephalic. Oropharynx and nasopharynx clear.  NECK:  Supple, no jugular venous distention. No thyroid enlargement, no tenderness. Right upper chest PermCath present. LUNGS: Normal breath sounds bilaterally, no wheezing, rales, rhonchi. No use of accessory muscles of respiration.   CARDIOVASCULAR: S1, S2 normal. No murmurs, rubs, or gallops.  ABDOMEN: Soft, nontender, nondistended. Bowel sounds present. No organomegaly or mass.  EXTREMITIES: No cyanosis, clubbing or edema b/l.    NEUROLOGIC: Cranial nerves II through XII are intact. No focal Motor or sensory deficits b/l.   PSYCHIATRIC: The patient is alert and oriented x 3.  SKIN: chronic buttock pressure sore  LABORATORY PANEL:   CBC  Recent Labs Lab 06/25/15 0509  WBC 7.9  HGB 8.9*  HCT 29.6*  PLT 243    Chemistries   Recent Labs Lab 06/26/15 0519  NA 140  K 3.7  CL 105  CO2 26  GLUCOSE 88  BUN 42*  CREATININE 2.99*  CALCIUM 8.5*    Cardiac Enzymes No results for input(s): TROPONINI in the last 168 hours.  RADIOLOGY:  Dg Chest Portable 1 View  06/24/2015  CLINICAL DATA:  81 year old male with retracted dialysis catheter. EXAM: PORTABLE CHEST 1 VIEW COMPARISON:  06/04/2015 FINDINGS: The right-sided dialysis catheter has been retracted with tip in the region of the right subclavian vein. Cardiomegaly is again noted. There is no evidence of focal airspace disease, pulmonary edema, suspicious pulmonary nodule/mass, pleural effusion, or pneumothorax. No acute bony abnormalities are identified. IMPRESSION: Retracted right dialysis catheter with tip in the region of the right subclavian vein. Electronically Signed   By: Harmon PierJeffrey  Hu M.D.   On: 06/24/2015 13:07   ASSESSMENT AND PLAN:   81 year old African-American male with end-stage renal disease is being admitted for loss of hemodialysis access.  1. Poor hemodialysis access: Patient to get PermCath today. We'll resume dialysis thereafter per nephrology.  2. Hypertension continue Toprol-XL.  3. Chronic atrial fibrillation continue diltiazem and metoprolol. Will hold Coumadin due to need for procedure.  Can resume Coumadin postprocedure.  4. End-stage renal disease nephrology consult  5. History of CVA hold aspirin and Plavix for procedure for  now.  6. Miscellaneous heparin for DVT prophylaxis  Case discussed with Care Management/Social Worker. Management plans discussed with the patient, family and they are in agreement.  CODE STATUS: full  DVT Prophylaxis: heparin  TOTAL TIME TAKING CARE OF THIS PATIENT: 25 minutes.  >50% time spent on counselling and coordination of care with pt and dter    Floriene Jeschke M.D on 06/26/2015 at 9:44 AM  Between 7am to 6pm - Pager - [phone]  After 6pm go to [email or web address] - password [email or web address] [facility]  [place] [facility]  Office  [phone]  CC: Primary care physician; Bobbye Morton, MD

## 2015-06-26 NOTE — Progress Notes (Signed)
[facility]  ROUNDING NOTE   Subjective:   Patient seen prior to beginning of dialysis New PermCath placed today  Objective:  Vital signs in last 24 hours:  Temp:  [97.2 F (36.2 C)-98.6 F (37 C)] 97.2 F (36.2 C) (12/19 1224) Pulse Rate:  [99-118] 118 (12/19 1324) Resp:  [16-24] 24 (12/19 1324) BP: (113-139)/(54-72) 123/68 mmHg (12/19 1324) SpO2:  [92 %-93 %] 92 % (12/19 1324)  Weight change:  Filed Weights   06/24/15 1129  Weight: 108.863 kg (240 lb)    Intake/Output: I/O last 3 completed shifts: In: 432 [P.O.:386; I.V.:46] Out: 0    Intake/Output this shift:     Physical Exam: General: NAD  Head: Normocephalic, atraumatic. Moist oral mucosal membranes  Eyes: Anicteric,   Neck: Supple, trachea midline  Lungs:  Clear to auscultation  Heart: Regular rate and rhythm  Abdomen:  Soft, nontender,   Extremities: no peripheral edema.  Neurologic: Nonfocal, moving all four extremities  Skin: No lesions  access Left IJ PermCath. Dr Wyn Quaker    .Basic Metabolic Panel:  Recent Labs Lab 06/24/15 1301 06/25/15 0509 06/26/15 0519  NA 139 137 140  K 3.6 3.5 3.7  CL 101 103 105  CO2 GLUCOSE 131* 90 88  BUN 34* 39* 42*  CREATININE 3.08* 3.07* 2.99*  CALCIUM 8.8* 8.3* 8.5*  PHOS  --  4.2  --     Liver Function Tests:  Recent Labs Lab 06/25/15 0509  ALBUMIN 2.1*   No results for input(s): LIPASE, AMYLASE in the last 168 hours. No results for input(s): AMMONIA in the last 168 hours.  CBC:  Recent Labs Lab 06/24/15 1301 06/25/15 0509  WBC 8.8 7.9  HGB 10.2* 8.9*  HCT 33.9* 29.6*  MCV 82.0 81.3  PLT 281 243    Cardiac Enzymes: No results for input(s): CKTOTAL, CKMB, CKMBINDEX, TROPONINI in the last 168 hours.  BNP: Invalid input(s): POCBNP  CBG: No results for input(s): GLUCAP in the last 168 hours.  Microbiology: Results for orders placed or performed during the hospital encounter of 06/24/15  MRSA PCR Screening      Status: None   Collection Time: 06/24/15  3:48 PM  Result Value Ref Range Status   MRSA by PCR NEGATIVE NEGATIVE Final    Comment:        The GeneXpert MRSA Assay (FDA approved for NASAL specimens only), is one component of a comprehensive MRSA colonization surveillance program. It is not intended to diagnose MRSA infection nor to guide or monitor treatment for MRSA infections.     Coagulation Studies:  Recent Labs  06/24/15 1301  LABPROT 14.1  INR 1.07    Urinalysis: No results for input(s): COLORURINE, LABSPEC, PHURINE, GLUCOSEU, HGBUR, BILIRUBINUR, KETONESUR, PROTEINUR, UROBILINOGEN, NITRITE, LEUKOCYTESUR in the last 72 hours.  Invalid input(s): APPERANCEUR    Imaging: No results found.   Medications:   . sodium chloride 10 mL/hr at 06/26/15 0009   . allopurinol  100 mg Oral Daily  . aspirin EC  81 mg Oral Daily  . atorvastatin  20 mg Oral Daily  . brimonidine  1 drop Left Eye BID  .  ceFAZolin (ANCEF) IV  1 g Intravenous On Call  . diltiazem  180 mg Oral Daily  . ferrous sulfate  325 mg Oral Q breakfast  . furosemide  40 mg Oral BID  . heparin  5,000 Units Subcutaneous Q12H  . isosorbide mononitrate  60 mg Oral Daily  . latanoprost  1 drop Both Eyes QHS  . lidocaine  1 patch Transdermal Daily  . metoprolol  200 mg Oral Daily  . miconazole  1 application Topical Daily  . multivitamin  1 tablet Oral QHS  . pregabalin  75 mg Oral Daily  . senna-docusate  2 tablet Oral QHS  . sodium chloride  3 mL Intravenous Q12H  . tolnaftate   Topical BID  . vitamin A & D   Topical Daily   sodium chloride, acetaminophen **OR** acetaminophen, HYDROcodone-acetaminophen, ondansetron **OR** ondansetron (ZOFRAN) IV, sodium chloride  Assessment/ Plan:  Mr. Sean BoutonHolt Fresquez Jr. is a 81 y.o. black  male with right eye blindness, diastolic congestive heart failure, hypertension, gout, glaucoma, peripheral neuropathy, atrial fibrillation, anemia, carotid stenosis status post  bilateral CEA, CVA, peripheral vascular disease who was [facility] to [facility] 06[facility]24/2015  [facility] TTS  1. End Stage Renal Disease: missed dialysis today due to complicated dialysis access. Cuff is completely out.  -  Dialysis today via new PermCath   2. Anemia of chronic kidney disease: Hemoglobin 8.9 - Micera as outpatient. - Fe sulfate   3. Secondary Hyperparathyroidism:  - currently not on a binder.   4. Hypertension: blood pressure at goal.  - diltiazem, furosemide, imdur, metoprolol   LOS: 2 Sean Delgado 12/19/20163:52 PM

## 2015-06-26 NOTE — Progress Notes (Signed)
Initial Nutrition Assessment     INTERVENTION:  Meals and snacks: Await diet progression following procedure.  Recommend once able to take solid foods renal diet   NUTRITION DIAGNOSIS:   Inadequate oral intake related to acute illness as evidenced by NPO status.    GOAL:   Patient will meet greater than or equal to 90% of their needs    MONITOR:    (Energy intake, Electrolyte and renal profile)  REASON FOR ASSESSMENT:    (dialysis pt)    ASSESSMENT:      Pt admitted with permcath partially being pulled out.  Planning re-insertion today  Past Medical History  Diagnosis Date  . ESRD (end stage renal disease) (HCC)   . Hypertension   . CHF (congestive heart failure) (HCC)   . Gout   . Glaucoma   . Neuropathy (HCC)   . Stroke (HCC)   . Peripheral vascular disease (HCC)   . Coronary artery disease   . A-fib (HCC)   . Dysrhythmia   . Blindness of right eye   . Anemia     iron deficiency    Current Nutrition: NPO  Food/Nutrition-Related History: pt not in room at this time.  Per MST normal intake prior to admission   Scheduled Medications:  . [MAR Hold] allopurinol  100 mg Oral Daily  . [MAR Hold] aspirin EC  81 mg Oral Daily  . [MAR Hold] atorvastatin  20 mg Oral Daily  . [MAR Hold] brimonidine  1 drop Left Eye BID  .  ceFAZolin (ANCEF) IV  1 g Intravenous On Call  . [MAR Hold] diltiazem  180 mg Oral Daily  . [MAR Hold] ferrous sulfate  325 mg Oral Q breakfast  . [MAR Hold] furosemide  40 mg Oral BID  . [MAR Hold] heparin  5,000 Units Subcutaneous Q12H  . [MAR Hold] isosorbide mononitrate  60 mg Oral Daily  . [MAR Hold] latanoprost  1 drop Both Eyes QHS  . [MAR Hold] lidocaine  1 patch Transdermal Daily  . [MAR Hold] metoprolol  200 mg Oral Daily  . [MAR Hold] miconazole  1 application Topical Daily  . [MAR Hold] multivitamin  1 tablet Oral QHS  . [MAR Hold] pregabalin  75 mg Oral Daily  . [MAR Hold] senna-docusate  2 tablet Oral QHS  . [MAR Hold]  sodium chloride  3 mL Intravenous Q12H  . [MAR Hold] tolnaftate   Topical BID  . [MAR Hold] vitamin A & D   Topical Daily    Continuous Medications:  . sodium chloride 10 mL/hr at 06/26/15 0009  . sodium chloride 20 mL/hr at 06/26/15 1104     Electrolyte/Renal Profile and Glucose Profile:   Recent Labs Lab 06/24/15 1301 06/25/15 0509 06/26/15 0519  NA 139 137 140  K 3.6 3.5 3.7  CL 101 103 105  CO2 30 27 26   BUN 34* 39* 42*  CREATININE 3.08* 3.07* 2.99*  CALCIUM 8.8* 8.3* 8.5*  PHOS  --  4.2  --   GLUCOSE 131* 90 88   Protein Profile:  Recent Labs Lab 06/25/15 0509  ALBUMIN 2.1*    Gastrointestinal Profile: Last BM:12/17   Nutrition-Focused Physical Exam Findings:  Unable to complete Nutrition-Focused physical exam at this time.     Weight Change: noted wt gain per wt encounters but per MST pt reports wt loss.   Wt Readings from Last 10 Encounters:  06/24/15 240 lb (108.863 kg)  06/23/15 225 lb (102.059 kg)  06/03/15 225 lb  8.5 oz (102.3 kg)  05/18/15 259 lb (117.482 kg)  04/17/15 240 lb 4.8 oz (109 kg)  03/30/15 257 lb (116.574 kg)  03/11/15 239 lb 12.8 oz (108.773 kg)      Diet Order:  Diet NPO time specified  Skin:   reviewed      Height:   Ht Readings from Last 1 Encounters:  06/24/15  (1.88 m)    Weight:   Wt Readings from Last 1 Encounters:  06/24/15 240 lb (108.863 kg)    Ideal Body Weight:     BMI:  Body mass index is 30.8 kg/(m^2).  Estimated Nutritional Needs:   Kcal:  BEE 1864 kcals (IF 1.0-1.2, AF 1.3) 2424 kcals-2907 kcals/d.  Protein:  (1.2-1.5 g/kg) 129-162 g/d  Fluid:  + UOP  EDUCATION NEEDS:   No education needs identified at this time  MODERATE Care Level  Dayleen Beske B. Freida Busman, RD, LDN [phone] (pager)

## 2015-06-26 NOTE — Progress Notes (Signed)
Pt to be d/c back to facility after dialysis treatment.  EMS to be called for transport.  [facility].

## 2015-06-26 NOTE — Discharge Summary (Signed)
[facility] - [place] at [facility]   PATIENT NAME: Sean Delgado    MR#:  [id number]  DATE OF BIRTH:  1934/06/15  DATE OF ADMISSION:  06/24/2015 ADMITTING PHYSICIAN: Auburn Bilberry, MD  DATE OF DISCHARGE: 06/26/15  PRIMARY CARE PHYSICIAN: Bobbye Morton, MD    ADMISSION DIAGNOSIS:  Chronic atrial fibrillation (HCC) [I48.2] Problem with dialysis access, initial encounter (HCC) [T82.898A]  DISCHARGE DIAGNOSIS:  Malfunctioning of HD catheter status post PermCath placement End-stage renal disease on hemodialysis  SECONDARY DIAGNOSIS:   Past Medical History  Diagnosis Date  . ESRD (end stage renal disease) (HCC)   . Hypertension   . CHF (congestive heart failure) (HCC)   . Gout   . Glaucoma   . Neuropathy (HCC)   . Stroke (HCC)   . Peripheral vascular disease (HCC)   . Coronary artery disease   . A-fib (HCC)   . Dysrhythmia   . Blindness of right eye   . Anemia     iron deficiency    HOSPITAL COURSE:  81 year old African-American male with end-stage renal disease is being admitted for loss of hemodialysis access.  1. Poor hemodialysis access: Patient s/p PermCath today. We'll resume dialysis thereafter per nephrology.  2. Hypertension continue Toprol-XL.  3. Chronic atrial fibrillation continue diltiazem and metoprolol.  -will resume Coumadin at d/c  4. End-stage renal disease nephrology consult appreciated Pt will resume his routine HD -he will get HD prior to d/c  5. History of CVA -resume aspirin and Plavix   6. Miscellaneous heparin for DVT prophylaxis If pt remains stable will d/c him after HD today D/w dter CONSULTS OBTAINED:  Treatment Team:  Auburn Bilberry, MD Lamont Dowdy, MD Renford Dills, MD Annice Needy, MD  DRUG ALLERGIES:  No Known Allergies  DISCHARGE MEDICATIONS:   Current Discharge Medication List    CONTINUE these medications which have NOT CHANGED   Details  acetaminophen (TYLENOL) 325 MG tablet  Take 650 mg by mouth every 6 (six) hours as needed for mild pain or moderate pain. Reported on 06/23/2015    allopurinol (ZYLOPRIM) 100 MG tablet Take 100 mg by mouth daily.    aspirin EC 81 MG tablet Take 81 mg by mouth daily. Reported on 06/23/2015    atorvastatin (LIPITOR) 20 MG tablet Take 1 tablet (20 mg total) by mouth daily. Qty: 30 tablet, Refills: 0    b complex-vitamin c-folic acid (NEPHRO-VITE) 0.8 MG TABS tablet Take 1 tablet by mouth daily.    brimonidine (ALPHAGAN) 0.2 % ophthalmic solution Place 1 drop into the left eye 2 (two) times daily.    clopidogrel (PLAVIX) 75 MG tablet Take 75 mg by mouth daily.    diltiazem (CARDIZEM CD) 180 MG 24 hr capsule Take 1 capsule (180 mg total) by mouth daily. Qty: 30 capsule, Refills: 0    ferrous sulfate 325 (65 FE) MG EC tablet Take 325 mg by mouth daily.    furosemide (LASIX) 40 MG tablet Take 1 tablet (40 mg total) by mouth 2 (two) times daily. Qty: 30 tablet, Refills: 0    HYDROcodone-acetaminophen (NORCO/VICODIN) 5-325 MG tablet Take 1 tablet by mouth every 6 (six) hours as needed for moderate pain. Qty: 5 tablet, Refills: 0    isosorbide mononitrate (IMDUR) 60 MG 24 hr tablet Take 60 mg by mouth daily.    latanoprost (XALATAN) 0.005 % ophthalmic solution Place 1 drop into both eyes at bedtime.    lidocaine (LIDODERM) 5 % Place 1 patch  onto the skin daily. Apply to right knee at 6am. Remove & Discard patch within 12 hours or as directed by MD    metoprolol (TOPROL-XL) 200 MG 24 hr tablet Take 200 mg by mouth daily.    pregabalin (LYRICA) 75 MG capsule Take 75 mg by mouth daily.     senna (SENOKOT) 8.6 MG TABS tablet Take 17.2 mg by mouth at bedtime. *note dose*    sodium bicarbonate 650 MG tablet Take 650 mg by mouth 2 (two) times daily.    warfarin (COUMADIN) 5 MG tablet Take 5 mg by mouth daily at 6 PM.      STOP taking these medications     miconazole (MICOTIN) 2 % cream      senna-docusate (SENOKOT-S) 8.6-50  MG per tablet         If you experience worsening of your admission symptoms, develop shortness of breath, life threatening emergency, suicidal or homicidal thoughts you must seek medical attention immediately by calling 911 or calling your MD immediately  if symptoms less severe.  You Must read complete instructions/literature along with all the possible adverse reactions/side effects for all the Medicines you take and that have been prescribed to you. Take any new Medicines after you have completely understood and accept all the possible adverse reactions/side effects.   Please note  You were cared for by a hospitalist during your hospital stay. If you have any questions about your discharge medications or the care you received while you were in the hospital after you are discharged, you can call the unit and asked to speak with the hospitalist on call if the hospitalist that took care of you is not available. Once you are discharged, your primary care physician will handle any further medical issues. Please note that NO REFILLS for any discharge medications will be authorized once you are discharged, as it is imperative that you return to your primary care physician (or establish a relationship with a primary care physician if you do not have one) for your aftercare needs so that they can reassess your need for medications and monitor your lab values. Today   SUBJECTIVE   Doing well  VITAL SIGNS:  Blood pressure 123/68, pulse 118, temperature 97.2 F (36.2 C), temperature source Oral, resp. rate 24, height 6\' 2"  (1.88 m), weight 108.863 kg (240 lb), SpO2 92 %.  I/O:    Intake/Output Summary (Last 24 hours) at 06/26/15 1528 Last data filed at 06/26/15 1015  Gross per 24 hour  Intake     43 ml  Output      0 ml  Net     43 ml    PHYSICAL EXAMINATION:  GENERAL:  81 y.o.-year-old patient lying in the bed with no acute distress.  EYES: Pupils equal, round, reactive to light and  accommodation. No scleral icterus. Extraocular muscles intact.  HEENT: Head atraumatic, normocephalic. Oropharynx and nasopharynx clear.  NECK:  Supple, no jugular venous distention. No thyroid enlargement, no tenderness.  LUNGS: Normal breath sounds bilaterally, no wheezing, rales,rhonchi or crepitation. No use of accessory muscles of respiration.  CARDIOVASCULAR: S1, S2 normal. No murmurs, rubs, or gallops.  ABDOMEN: Soft, non-tender, non-distended. Bowel sounds present. No organomegaly or mass.  EXTREMITIES: No pedal edema, cyanosis, or clubbing.  NEUROLOGIC: Cranial nerves II through XII are intact. Muscle strength 5/5 in all extremities. Sensation intact. Gait not checked.  PSYCHIATRIC: The patient is alert and oriented x 3.  SKIN: No obvious rash, lesion, or ulcer.  DATA REVIEW:   CBC   Recent Labs Lab 06/25/15 0509  WBC 7.9  HGB 8.9*  HCT 29.6*  PLT 243    Chemistries   Recent Labs Lab 06/26/15 0519  NA 140  K 3.7  CL 105  CO2 26  GLUCOSE 88  BUN 42*  CREATININE 2.99*  CALCIUM 8.5*    Microbiology Results   Recent Results (from the past 240 hour(s))  Surgical pcr screen     Status: None   Collection Time: 06/23/15 11:52 AM  Result Value Ref Range Status   MRSA, PCR NEGATIVE NEGATIVE Final   Staphylococcus aureus NEGATIVE NEGATIVE Final    Comment:        The Xpert SA Assay (FDA approved for NASAL specimens in patients over 55 years of age), is one component of a comprehensive surveillance program.  Test performance has been validated by [facility] for patients greater than or equal to 64 year old. It is not intended to diagnose infection nor to guide or monitor treatment.   MRSA PCR Screening     Status: None   Collection Time: 06/24/15  3:48 PM  Result Value Ref Range Status   MRSA by PCR NEGATIVE NEGATIVE Final    Comment:        The GeneXpert MRSA Assay (FDA approved for NASAL specimens only), is one component of a comprehensive MRSA  colonization surveillance program. It is not intended to diagnose MRSA infection nor to guide or monitor treatment for MRSA infections.     RADIOLOGY:  No results found.   Management plans discussed with the patient, family and they are in agreement.  CODE STATUS:     Code Status Orders        Start     Ordered   06/24/15 1608  Full code   Continuous     06/24/15 1607      TOTAL TIME TAKING CARE OF THIS PATIENT: 40 minutes.    Haide Klinker M.D on 06/26/2015 at 3:28 PM  Between 7am to 6pm - Pager - [phone] After 6pm go to [email or web address] - password [email or web address] [facility]  [place] [facility]  Office  [phone]  CC: Primary care physician; Bobbye Morton, MD

## 2015-06-26 NOTE — Progress Notes (Signed)
[facility].  Patient to go to dialysis prior to returning to the floor.  VSS.  Patient denies any pain and no distress noted.  Box lunch ordered and patient will eat and then go to dialysis.  Will continue to monitor.

## 2015-06-26 NOTE — H&P (Signed)
  [facility] History & Physical Update  The patient was interviewed and re-examined.  The patient's previous History and Physical has been reviewed and is unchanged.  There is no change in the plan of care. We plan to proceed with the scheduled procedure.  Aeon Koors, MD  06/26/2015, 12:50 PM

## 2015-06-26 NOTE — Clinical Social Work Note (Signed)
Patient to discharge today after dialysis this evening. CSW has [facility] and Dr. Allena KatzPatel has spoken to patient's family to notify that he will [facility] and Dr. Allena KatzPatel has stated family is in agreement. [facility] Epic [facility]. Patient to transport via EMS.  York SpanielMonica Fantasy Donald MSW,LCSW [phone]

## 2015-06-26 NOTE — Op Note (Signed)
OPERATIVE NOTE    PRE-OPERATIVE DIAGNOSIS: 1. ESRD   POST-OPERATIVE DIAGNOSIS: same as above  PROCEDURE: 1. Ultrasound guidance for vascular access to the left internal jugular vein 2. Fluoroscopic guidance for placement of catheter 3. Placement of a 27 cm tip to cuff tunneled hemodialysis catheter via the left internal jugular vein  SURGEON: Festus BarrenJason Dew, MD  ANESTHESIA:  Local/MCS  ESTIMATED BLOOD LOSS: minimal  FINDING(S): 1.  Patent left internal jugular vein  SPECIMEN(S):  None  INDICATIONS:   Sean BoutonHolt Hommel Jr. is a 81 y.o. male who presents with ESRD and loss of his previous dialysis access. He is scheduled to have a fistula placed later this week, but this will not be useable for several weeks.  The patient needs long term dialysis access for their ESRD, and a Permcath is necessary.  Risks and benefits are discussed and informed consent is obtained.    DESCRIPTION: After obtaining full informed written consent, the patient was brought back to the vascular suited. The patient's left neck and chest were sterilely prepped and draped in a sterile surgical field was created.  The left internal jugular vein was visualized with ultrasound and found to be patent. It was then accessed under direct ultrasound guidance and a permanent image was recorded. A wire was placed. After skin nick and dilatation, the peel-away sheath was placed over the wire. I then turned my attention to an area under the clavicle. Approximately 1-2 fingerbreadths below the clavicle a small counterincision was created and tunneled from the subclavicular incision to the access site. Using fluoroscopic guidance, a 27 centimeter tip to cuff tunneled hemodialysis catheter was selected, and tunneled from the subclavicular incision to the access site. It was then placed through the peel-away sheath and the peel-away sheath was removed. Using fluoroscopic guidance the catheter tips were parked in the right atrium. The  appropriate distal connectors were placed. It withdrew blood well and flushed easily with heparinized saline and a concentrated heparin solution was then placed. It was secured to the chest wall with 2 Prolene sutures. The access incision was closed single 4-0 Monocryl. A 4-0 Monocryl pursestring suture was placed around the exit site. Sterile dressings were placed. The patient tolerated the procedure well and was taken to the recovery room in stable condition.  COMPLICATIONS: None  CONDITION: Stable  DEW,JASON  06/26/2015, 1:22 PM

## 2015-06-26 NOTE — Discharge Instructions (Signed)
Tunneled Catheter Insertion °Catheters are thin, flexible tubes that are inserted into a vein to provide access to the bloodstream. A tunneled catheter is used when a person's bloodstream needs to be accessed many times over a long period, usually longer than 30 days. The catheter provides a painless method of drawing blood, giving blood products, removing waste products from the blood (hemodialysis), and giving medicines. Tunneled catheters can be placed in different parts of the body depending on how they will be used. These catheters are secure and easy to access. A part of the catheter is tunneled under the skin. This is done to decrease the risk of infection.  °There are various types of tunneled catheters. The specific one used will depend on your needs. The catheter can be used right after insertion. °LET YOUR HEALTH CARE PROVIDER KNOW ABOUT:  °· Any allergies you have. °· All medicines you are taking, including vitamins, herbs, eyedrops, and over-the-counter medicines and creams.   °· Previous problems you or members of your family have had with the use of anesthetics.   °· Any blood disorders you have had. °· Possibility of pregnancy, if this applies.   °· Other health problems you have. Also, let your health care provider know if you have a pacemaker. °RISKS AND COMPLICATIONS °Generally, tunneled catheter insertion is a safe procedure. However, as with any surgical procedure, complications can occur. Possible complications include: °· Damage to the blood vessel.   °· Bruising or bleeding at the site of puncture.   °· Introduction of the catheter into an artery instead of a vein.   °· Skin infection at the site of catheter insertion.   °· Bloodstream infection, especially if your white blood cell count is low.   °· Developing a kink in the catheter so it does not work properly.   °· Developing a hole or crack in the catheter.   °· Blockage of the catheter.   °· Getting air in the catheter. °· Blood clots  around the catheter or in the vein near the catheter. °· Disturbance in the normal heart rhythm (rare). This is usually temporary.   °· A collapsed lung during insertion (rare).   °BEFORE THE PROCEDURE  °· You may need to have blood tests done before the day of the procedure.   °· Do not eat or drink anything for at least 8 hours before the procedure or as directed by your health care provider.   °· Ask your health care provider about changing or stopping your regular medicines. °· Avoid wearing jewelry the day of the procedure.   °· Make plans to have someone drive you home after the procedure. You should not drive immediately after the procedure.   °PROCEDURE °· You will be asked to lie on your back. °· A regular intravenous (IV) access tube may be put into a vein in your hand or arm. During the procedure, medicine can flow directly into your body through the IV tube. °· Small monitors will be put on your body. They are used to check your heart, blood pressure, and oxygen level. °· The catheter site is usually shaved, cleaned, and covered with a sterile drape. °· You will be given medicine to numb the area where the catheter will be placed (local anesthetic). You may also be given a medicine to help you relax (sedative). °· The health care provider will then make a small incision in the skin, usually in the lower neck. Another small incision is made a little lower, usually on the shoulder or upper chest. Ultrasonography may be used so that the health   care provider can see the vein and can properly guide the catheter placement.  X-ray equipment may also be used to help ensure that the catheter is inserted safely and is placed where it will function most effectively. This equipment allows the health care provider to watch the catheter on a live display while guiding it into place.  Generally, a small guidewire is put into the vein first. A tunnel is created under the skin. The health care provider guides the  movement of the guidewire into a larger vein closer to the heart.  The catheter is pulled through the tunnel and then moved into the larger vein. The cuff on the catheter is located in the tunnel part. The cuff helps to anchor the catheter in place over time. Stitches are used to keep the catheter in place when first put in.  An X-ray may be done to make sure the catheter is in the right place. AFTER THE PROCEDURE   You may stay in a recovery area until the sedation has worn off.  Your heart rate, blood pressure and oxygen level will be monitored.  You may have some pain and swelling in the neck or shoulder. You will likely be given medicine to control this.  If this was done as an outpatient procedure, you may be able to go home the same day.   This information is not intended to replace advice given to you by your health care provider. Make sure you discuss any questions you have with your health care provider.   Document Released: 07/14/2007 Document Revised: 07/15/2014 Document Reviewed: 05/06/2012 Elsevier Interactive Patient Education 2016 [employer]   Resume your HD as before

## 2015-06-27 ENCOUNTER — Encounter: Payer: Self-pay | Admitting: Vascular Surgery

## 2015-06-29 ENCOUNTER — Ambulatory Visit: Payer: Medicare (Managed Care) | Admitting: Anesthesiology

## 2015-06-29 ENCOUNTER — Encounter
Admission: RE | Disposition: A | Discharge status: Skilled Nursing Facility | Payer: Self-pay | Source: Ambulatory Visit | Attending: Vascular Surgery

## 2015-06-29 ENCOUNTER — Ambulatory Visit
Admission: RE | Admit: 2015-06-29 | Discharge: 2015-06-29 | Disposition: A | Discharge status: Skilled Nursing Facility | Payer: Medicare (Managed Care) | Source: Ambulatory Visit | Attending: Vascular Surgery | Admitting: Vascular Surgery

## 2015-06-29 DIAGNOSIS — E7800 Pure hypercholesterolemia, unspecified: Secondary | ICD-10-CM | POA: Insufficient documentation

## 2015-06-29 DIAGNOSIS — Z7902 Long term (current) use of antithrombotics/antiplatelets: Secondary | ICD-10-CM | POA: Diagnosis not present

## 2015-06-29 DIAGNOSIS — I2510 Atherosclerotic heart disease of native coronary artery without angina pectoris: Secondary | ICD-10-CM | POA: Diagnosis not present

## 2015-06-29 DIAGNOSIS — Z87891 Personal history of nicotine dependence: Secondary | ICD-10-CM | POA: Diagnosis not present

## 2015-06-29 DIAGNOSIS — I739 Peripheral vascular disease, unspecified: Secondary | ICD-10-CM | POA: Diagnosis not present

## 2015-06-29 DIAGNOSIS — N186 End stage renal disease: Secondary | ICD-10-CM | POA: Insufficient documentation

## 2015-06-29 DIAGNOSIS — H5441 Blindness, right eye, normal vision left eye: Secondary | ICD-10-CM | POA: Diagnosis not present

## 2015-06-29 DIAGNOSIS — Z7982 Long term (current) use of aspirin: Secondary | ICD-10-CM | POA: Insufficient documentation

## 2015-06-29 DIAGNOSIS — M109 Gout, unspecified: Secondary | ICD-10-CM | POA: Diagnosis not present

## 2015-06-29 DIAGNOSIS — Z8673 Personal history of transient ischemic attack (TIA), and cerebral infarction without residual deficits: Secondary | ICD-10-CM | POA: Insufficient documentation

## 2015-06-29 DIAGNOSIS — Z79899 Other long term (current) drug therapy: Secondary | ICD-10-CM | POA: Diagnosis not present

## 2015-06-29 DIAGNOSIS — I120 Hypertensive chronic kidney disease with stage 5 chronic kidney disease or end stage renal disease: Secondary | ICD-10-CM | POA: Diagnosis present

## 2015-06-29 HISTORY — PX: AV FISTULA PLACEMENT: SHX1204

## 2015-06-29 LAB — POCT I-STAT 4, (NA,K, GLUC, HGB,HCT)
GLUCOSE: 132 mg/dL — AB (ref 65–99)
HEMATOCRIT: 35 % — AB (ref 39.0–52.0)
HEMOGLOBIN: 11.9 g/dL — AB (ref 13.0–17.0)
Potassium: 3.5 mmol/L (ref 3.5–5.1)
Sodium: 137 mmol/L (ref 135–145)

## 2015-06-29 LAB — PROTIME-INR
INR: 1.18
PROTHROMBIN TIME: 15.2 s — AB (ref 11.4–15.0)

## 2015-06-29 SURGERY — ARTERIOVENOUS (AV) FISTULA CREATION
Anesthesia: General | Laterality: Right

## 2015-06-29 MED ORDER — BUPIVACAINE-EPINEPHRINE (PF) 0.5% -1:200000 IJ SOLN
INTRAMUSCULAR | Status: DC | PRN
  Administered 2015-06-29: 9 mL

## 2015-06-29 MED ORDER — FENTANYL CITRATE (PF) 100 MCG/2ML IJ SOLN: 25.0000 ug | INTRAMUSCULAR | Status: DC | PRN

## 2015-06-29 MED ORDER — FAMOTIDINE 20 MG PO TABS
ORAL_TABLET | ORAL | Status: AC
  Filled 2015-06-29: qty 1

## 2015-06-29 MED ORDER — SODIUM CHLORIDE 0.9 % IV SOLN
10000.0000 ug | INTRAVENOUS | Status: DC | PRN
  Administered 2015-06-29: 50 ug/min via INTRAVENOUS

## 2015-06-29 MED ORDER — CEFAZOLIN SODIUM-DEXTROSE 2-3 GM-% IV SOLR
INTRAVENOUS | Status: AC
  Filled 2015-06-29: qty 50

## 2015-06-29 MED ORDER — CEFAZOLIN SODIUM-DEXTROSE 2-3 GM-% IV SOLR
2.0000 g | INTRAVENOUS | Status: AC
  Administered 2015-06-29: 2 g via INTRAVENOUS

## 2015-06-29 MED ORDER — HEPARIN SODIUM (PORCINE) 5000 UNIT/ML IJ SOLN
INTRAMUSCULAR | Status: AC
  Filled 2015-06-29: qty 1

## 2015-06-29 MED ORDER — BUPIVACAINE-EPINEPHRINE (PF) 0.5% -1:200000 IJ SOLN
INTRAMUSCULAR | Status: AC
  Filled 2015-06-29: qty 30

## 2015-06-29 MED ORDER — PAPAVERINE HCL 30 MG/ML IJ SOLN
INTRAMUSCULAR | Status: AC
  Filled 2015-06-29: qty 2

## 2015-06-29 MED ORDER — SODIUM CHLORIDE 0.9 % IV SOLN
INTRAVENOUS | Status: DC
Start: 2015-06-29 — End: 2015-06-29
  Administered 2015-06-29: 12:00:00 via INTRAVENOUS

## 2015-06-29 MED ORDER — FAMOTIDINE 20 MG PO TABS
20.0000 mg | ORAL_TABLET | Freq: Once | ORAL | Status: AC
  Administered 2015-06-29: 20 mg via ORAL

## 2015-06-29 MED ORDER — PROPOFOL 10 MG/ML IV BOLUS
INTRAVENOUS | Status: DC | PRN
  Administered 2015-06-29: 100 mg via INTRAVENOUS

## 2015-06-29 MED ORDER — HEPARIN SODIUM (PORCINE) 1000 UNIT/ML IJ SOLN
INTRAMUSCULAR | Status: DC | PRN
  Administered 2015-06-29: 3000 [IU] via INTRAVENOUS

## 2015-06-29 MED ORDER — ONDANSETRON HCL 4 MG/2ML IJ SOLN: 4.0000 mg | Freq: Once | INTRAMUSCULAR | Status: DC | PRN

## 2015-06-29 MED ORDER — HYDROCODONE-ACETAMINOPHEN 5-325 MG PO TABS
1.0000 | ORAL_TABLET | Freq: Four times a day (QID) | ORAL | Status: DC | PRN
Start: 2015-06-29 — End: 2015-08-04

## 2015-06-29 MED ORDER — LIDOCAINE HCL (CARDIAC) 20 MG/ML IV SOLN
INTRAVENOUS | Status: DC | PRN
  Administered 2015-06-29: 100 mg via INTRAVENOUS

## 2015-06-29 MED ORDER — ONDANSETRON HCL 4 MG/2ML IJ SOLN
INTRAMUSCULAR | Status: DC | PRN
  Administered 2015-06-29: 4 mg via INTRAVENOUS

## 2015-06-29 MED ORDER — GLYCOPYRROLATE 0.2 MG/ML IJ SOLN
INTRAMUSCULAR | Status: DC | PRN
  Administered 2015-06-29: 0.2 mg via INTRAVENOUS

## 2015-06-29 SURGICAL SUPPLY — 47 items
BAG DECANTER FOR FLEXI CONT (MISCELLANEOUS) ×3 IMPLANT
BLADE SURG SZ11 CARB STEEL (BLADE) ×6 IMPLANT
BOOT SUTURE AID YELLOW STND (SUTURE) ×3 IMPLANT
BRUSH SCRUB 4% CHG (MISCELLANEOUS) ×3 IMPLANT
CANISTER SUCT 1200ML W/VALVE (MISCELLANEOUS) ×3 IMPLANT
CHLORAPREP W/TINT 26ML (MISCELLANEOUS) ×3 IMPLANT
CLIP SPRNG 6MM S-JAW DBL (CLIP) ×3
ELECT CAUTERY BLADE 6.4 (BLADE) ×3 IMPLANT
GEL ULTRASOUND 20GR AQUASONIC (MISCELLANEOUS) IMPLANT
GLOVE BIO SURGEON STRL SZ7 (GLOVE) ×3 IMPLANT
GOWN STRL REUS W/ TWL LRG LVL3 (GOWN DISPOSABLE) ×1 IMPLANT
GOWN STRL REUS W/ TWL XL LVL3 (GOWN DISPOSABLE) ×1 IMPLANT
GOWN STRL REUS W/TWL LRG LVL3 (GOWN DISPOSABLE) ×2
GOWN STRL REUS W/TWL XL LVL3 (GOWN DISPOSABLE) ×2
HEMOSTAT SURGICEL 2X3 (HEMOSTASIS) ×3 IMPLANT
IV NS 500ML (IV SOLUTION) ×2
IV NS 500ML BAXH (IV SOLUTION) ×1 IMPLANT
KIT RM TURNOVER STRD PROC AR (KITS) ×3 IMPLANT
LABEL OR SOLS (LABEL) ×3 IMPLANT
LIQUID BAND (GAUZE/BANDAGES/DRESSINGS) ×3 IMPLANT
LOOP RED MAXI  1X406MM (MISCELLANEOUS) ×2
LOOP VESSEL MAXI 1X406 RED (MISCELLANEOUS) ×1 IMPLANT
LOOP VESSEL MINI 0.8X406 BLUE (MISCELLANEOUS) ×1 IMPLANT
LOOPS BLUE MINI 0.8X406MM (MISCELLANEOUS) ×2
NEEDLE FILTER BLUNT 18X 1/2SAF (NEEDLE) ×2
NEEDLE FILTER BLUNT 18X1 1/2 (NEEDLE) ×1 IMPLANT
NEEDLE HYPO 30X.5 LL (NEEDLE) IMPLANT
NS IRRIG 500ML POUR BTL (IV SOLUTION) ×3 IMPLANT
PACK EXTREMITY ARMC (MISCELLANEOUS) ×3 IMPLANT
PAD GROUND ADULT SPLIT (MISCELLANEOUS) ×3 IMPLANT
PAD PREP 24X41 OB/GYN DISP (PERSONAL CARE ITEMS) ×3 IMPLANT
SOLUTION CELL SAVER (CLIP) ×1 IMPLANT
STOCKINETTE STRL 4IN 9604848 (GAUZE/BANDAGES/DRESSINGS) ×3 IMPLANT
SUT MNCRL AB 4-0 PS2 18 (SUTURE) ×3 IMPLANT
SUT PROLENE 6 0 BV (SUTURE) ×9 IMPLANT
SUT SILK 2 0 (SUTURE) ×2
SUT SILK 2-0 18XBRD TIE 12 (SUTURE) ×1 IMPLANT
SUT SILK 3 0 (SUTURE) ×2
SUT SILK 3-0 18XBRD TIE 12 (SUTURE) ×1 IMPLANT
SUT SILK 4 0 (SUTURE)
SUT SILK 4-0 18XBRD TIE 12 (SUTURE) IMPLANT
SUT VIC AB 3-0 SH 27 (SUTURE) ×4
SUT VIC AB 3-0 SH 27X BRD (SUTURE) ×2 IMPLANT
SYR 20CC LL (SYRINGE) ×3 IMPLANT
SYR 3ML LL SCALE MARK (SYRINGE) ×3 IMPLANT
SYR TB 1ML 27GX1/2 LL (SYRINGE) IMPLANT
TOWEL OR 17X26 4PK STRL BLUE (TOWEL DISPOSABLE) IMPLANT

## 2015-06-29 NOTE — Discharge Instructions (Signed)

## 2015-06-29 NOTE — Op Note (Signed)
[facility]   OPERATIVE NOTE   PROCEDURE: right brachiocephalic arteriovenous fistula placement  PRE-OPERATIVE DIAGNOSIS: 1.  ESRD      2. HTN     3. History of stroke  POST-OPERATIVE DIAGNOSIS: 1. Same as above  SURGEON: Festus BarrenJason Dew, MD  ASSISTANT(S): none  ANESTHESIA: general  ESTIMATED BLOOD LOSS: minimal  FINDING(S): Adequate cephalic vein for fistula creation  SPECIMEN(S):  none  INDICATIONS:   Sean BoutonHolt Mayor Jr. is a 81 y.o. male who presents with renal failure in need of pemanent dialysis acces.  The patient is scheduled for right arm AVF placement.  The patient is aware the risks include but are not limited to: bleeding, infection, steal syndrome, nerve damage, ischemic monomelic neuropathy, failure to mature, and need for additional procedures.  The patient is aware of the risks of the procedure and elects to proceed forward.  DESCRIPTION: After full informed written consent was obtained from the patient, the patient was brought back to the operating room and placed supine upon the operating table.  Prior to induction, the patient received IV antibiotics.   After obtaining adequate anesthesia, the patient was then prepped and draped in the standard fashion for a right arm access procedure.  I made a curvilinear incision at the level of the antecubital fossa and dissected through the subcutaneous tissue and fascia to gain exposure of the brachial artery.  This was noted to be patent and adequate in size for fistula creation.  This was dissected out proximally and distally and prepared for control with vessel loops .  I then dissected out the cephalic vein.  This was noted to be patent and marginal in size for fistula creation. I used dilators up to 3.5 mm and ligated several side branches. I then gave the patient 3000 units of intravenous heparin.  The vein was marked for orientation and the distal segment of the vein was ligated with a  2-0 silk, and the  vein was transected.  I then instilled the heparinized saline into the vein and clamped it.  At this point, I reset my exposure of the brachial artery and pulled up control on the vessel loops.  I made an arteriotomy with a #11 blade, and then I extended the arteriotomy with a Potts scissor.  I injected heparinized saline proximal and distal to this arteriotomy.  The vein was then sewn to the artery in an end-to-side configuration with a running stitch of 6-0 Prolene.  Prior to completing this anastomosis, I allowed the vein and artery to backbleed.  There was no evidence of clot from any vessels.  I completed the anastomosis in the usual fashion and then released all vessel loops and clamps.  There was a palpable  thrill in the venous outflow, and there was a palpable pulse in the artery distal to the anastomosis.  At this point, I irrigated out the surgical wound.  Surgicel was placed. There was no further active bleeding.  The subcutaneous tissue was reapproximated with a running stitch of 3-0 Vicryl.  The skin was then closed with a 4-0 Monocryl suture.  The skin was then cleaned, dried, and reinforced with Dermabond.  The patient tolerated this procedure well and was taken to the recovery room in stable condition  COMPLICATIONS: None  CONDITION: Stable   DEW,JASON    06/29/2015, 3:45 PM

## 2015-06-29 NOTE — Anesthesia Preprocedure Evaluation (Signed)
Anesthesia Evaluation  Patient identified by MRN, date of birth, ID band Patient awake    Reviewed: Allergy & Precautions, H&P , NPO status , [name] Chart, lab work & pertinent test results, reviewed documented beta blocker date and time   Airway Mallampati: III  TM Distance: >3 FB Neck ROM: full    Dental no notable dental hx.    Pulmonary neg pulmonary ROS, former smoker,    Pulmonary exam normal breath sounds clear to auscultation       Cardiovascular Exercise Tolerance: Good hypertension, + CAD, + Peripheral Vascular Disease and +CHF  negative cardio ROS  + dysrhythmias Atrial Fibrillation  Rhythm:regular Rate:Normal     Neuro/Psych Neuropathy...blindness of right eye CVA, Residual Symptoms negative neurological ROS  negative psych ROS   GI/Hepatic negative GI ROS, Neg liver ROS,   Endo/Other  negative endocrine ROS  Renal/GU ESRFRenal diseasenegative Renal ROS  negative genitourinary   Musculoskeletal negative musculoskeletal ROS (+)   Abdominal   Peds  Hematology negative hematology ROS (+) anemia ,   Anesthesia Other Findings gout  Reproductive/Obstetrics negative OB ROS                             Anesthesia Physical  Anesthesia Plan  ASA: IV  Anesthesia Plan: General   Post-op Pain Management:    Induction: Intravenous  Airway Management Planned: LMA  Additional Equipment:   Intra-op Plan:   Post-operative Plan: Extubation in OR  Informed Consent: I have reviewed the patients History and Physical, chart, labs and discussed the procedure including the risks, benefits and alternatives for the proposed anesthesia with the patient or authorized representative who has indicated his/her understanding and acceptance.   Dental advisory given  Plan Discussed with: CRNA and Surgeon  Anesthesia Plan Comments:        Anesthesia Quick Evaluation

## 2015-06-29 NOTE — OR Nursing (Signed)
Report given to Chaya JanHeather Joyce RN.

## 2015-06-29 NOTE — Anesthesia Procedure Notes (Signed)
Procedure Name: LMA Insertion Date/Time: 06/29/2015 2:21 PM Performed by: Sean Delgado, Sean Frein Pre-anesthesia Checklist: Patient identified, Patient being monitored, Timeout performed, Emergency Drugs available and Suction available Patient Re-evaluated:Patient Re-evaluated prior to inductionOxygen Delivery Method: Circle system utilized Preoxygenation: Pre-oxygenation with 100% oxygen Intubation Type: IV induction Ventilation: Mask ventilation without difficulty LMA: LMA inserted LMA Size: 4.5 Tube type: Oral Number of attempts: 1 Placement Confirmation: positive ETCO2 and breath sounds checked- equal and bilateral Tube secured with: Tape Dental Injury: Teeth and Oropharynx as per pre-operative assessment

## 2015-06-29 NOTE — H&P (Signed)
[facility] History & Physical Update  The patient was interviewed and re-examined.  The patient's previous History and Physical has been reviewed and is unchanged.  There is no change in the plan of care. We plan to proceed with the scheduled procedure.  Phynix Horton, MD  06/29/2015, 1:38 PM

## 2015-06-29 NOTE — Transfer of Care (Signed)
Immediate Anesthesia Transfer of Care Note  Patient: Sean BoutonHolt Gehl Jr.  Procedure(s) Performed: Procedure(s): BRACHIAL CEPHALIC AV FISTULA (Right)  Patient Location: PACU  Anesthesia Type:General  Level of Consciousness: sedated  Airway & Oxygen Therapy: Patient Spontanous Breathing and Patient connected to face mask  Post-op Assessment: Report given to RN  Post vital signs: Reviewed and stable  Last Vitals:  Filed Vitals:   06/29/15 1058 06/29/15 1552  BP: 90/53 129/86  Pulse: 89 110  Temp: 36.4 C 36.7 C  Resp: 16 19    Complications: No apparent anesthesia complications

## 2015-06-29 NOTE — OR Nursing (Signed)
Patient is visually impaired and unable to sign consents.    Patient gave verbal consent for procedure and asked that his niece Percell Locusnore Burnette sign his consents.

## 2015-06-29 NOTE — OR Nursing (Signed)
Flushed with 10 ml of 5000 units in 500 ml NaCl

## 2015-06-29 NOTE — OR Nursing (Signed)
Patient had 1 incontinent stool in the operating room. Patient was cleaned and new incontinence brief was placed.

## 2015-06-29 NOTE — Anesthesia Postprocedure Evaluation (Signed)
Anesthesia Post Note  Patient: Sean BoutonHolt Kolasa Jr.  Procedure(s) Performed: Procedure(s) (LRB): BRACHIAL CEPHALIC AV FISTULA (Right)  Patient location during evaluation: PACU Anesthesia Type: General Level of consciousness: awake and alert Pain management: pain level controlled Vital Signs Assessment: post-procedure vital signs reviewed and stable Respiratory status: spontaneous breathing, nonlabored ventilation, respiratory function stable and patient connected to nasal cannula oxygen Cardiovascular status: blood pressure returned to baseline and stable Postop Assessment: no signs of nausea or vomiting Anesthetic complications: no    Last Vitals:  Filed Vitals:   06/29/15 1637 06/29/15 1645  BP: 98/70 105/58  Pulse: 117 112  Temp:    Resp: 21 20    Last Pain:  Filed Vitals:   06/29/15 1732  PainSc: 9                  Joseph K Piscitello

## 2015-06-30 ENCOUNTER — Encounter: Payer: Self-pay | Admitting: Vascular Surgery

## 2015-07-22 ENCOUNTER — Emergency Department: Payer: Medicare (Managed Care)

## 2015-07-22 ENCOUNTER — Inpatient Hospital Stay
Admission: EM | Admit: 2015-07-22 | Discharge: 2015-08-04 | DRG: 314 | Disposition: A | Discharge status: Skilled Nursing Facility | Payer: Medicare (Managed Care) | Attending: Internal Medicine | Admitting: Internal Medicine

## 2015-07-22 ENCOUNTER — Encounter: Payer: Self-pay | Admitting: Emergency Medicine

## 2015-07-22 DIAGNOSIS — Z992 Dependence on renal dialysis: Secondary | ICD-10-CM

## 2015-07-22 DIAGNOSIS — N2581 Secondary hyperparathyroidism of renal origin: Secondary | ICD-10-CM | POA: Diagnosis present

## 2015-07-22 DIAGNOSIS — R0602 Shortness of breath: Secondary | ICD-10-CM

## 2015-07-22 DIAGNOSIS — Z8673 Personal history of transient ischemic attack (TIA), and cerebral infarction without residual deficits: Secondary | ICD-10-CM

## 2015-07-22 DIAGNOSIS — Z87891 Personal history of nicotine dependence: Secondary | ICD-10-CM | POA: Diagnosis not present

## 2015-07-22 DIAGNOSIS — I9589 Other hypotension: Secondary | ICD-10-CM

## 2015-07-22 DIAGNOSIS — D631 Anemia in chronic kidney disease: Secondary | ICD-10-CM | POA: Diagnosis present

## 2015-07-22 DIAGNOSIS — I5030 Unspecified diastolic (congestive) heart failure: Secondary | ICD-10-CM | POA: Diagnosis present

## 2015-07-22 DIAGNOSIS — E876 Hypokalemia: Secondary | ICD-10-CM | POA: Diagnosis present

## 2015-07-22 DIAGNOSIS — H409 Unspecified glaucoma: Secondary | ICD-10-CM | POA: Diagnosis present

## 2015-07-22 DIAGNOSIS — D509 Iron deficiency anemia, unspecified: Secondary | ICD-10-CM | POA: Diagnosis present

## 2015-07-22 DIAGNOSIS — T8242XA Displacement of vascular dialysis catheter, initial encounter: Secondary | ICD-10-CM | POA: Diagnosis present

## 2015-07-22 DIAGNOSIS — R059 Cough, unspecified: Secondary | ICD-10-CM

## 2015-07-22 DIAGNOSIS — E785 Hyperlipidemia, unspecified: Secondary | ICD-10-CM | POA: Diagnosis present

## 2015-07-22 DIAGNOSIS — A4102 Sepsis due to Methicillin resistant Staphylococcus aureus: Secondary | ICD-10-CM | POA: Diagnosis present

## 2015-07-22 DIAGNOSIS — Z7982 Long term (current) use of aspirin: Secondary | ICD-10-CM | POA: Diagnosis not present

## 2015-07-22 DIAGNOSIS — I4891 Unspecified atrial fibrillation: Secondary | ICD-10-CM | POA: Diagnosis present

## 2015-07-22 DIAGNOSIS — J690 Pneumonitis due to inhalation of food and vomit: Secondary | ICD-10-CM | POA: Diagnosis present

## 2015-07-22 DIAGNOSIS — T827XXA Infection and inflammatory reaction due to other cardiac and vascular devices, implants and grafts, initial encounter: Principal | ICD-10-CM | POA: Diagnosis present

## 2015-07-22 DIAGNOSIS — J9600 Acute respiratory failure, unspecified whether with hypoxia or hypercapnia: Secondary | ICD-10-CM | POA: Diagnosis present

## 2015-07-22 DIAGNOSIS — E440 Moderate protein-calorie malnutrition: Secondary | ICD-10-CM | POA: Diagnosis present

## 2015-07-22 DIAGNOSIS — Y95 Nosocomial condition: Secondary | ICD-10-CM | POA: Diagnosis present

## 2015-07-22 DIAGNOSIS — A047 Enterocolitis due to Clostridium difficile: Secondary | ICD-10-CM | POA: Diagnosis not present

## 2015-07-22 DIAGNOSIS — M109 Gout, unspecified: Secondary | ICD-10-CM | POA: Diagnosis present

## 2015-07-22 DIAGNOSIS — I132 Hypertensive heart and chronic kidney disease with heart failure and with stage 5 chronic kidney disease, or end stage renal disease: Secondary | ICD-10-CM | POA: Diagnosis present

## 2015-07-22 DIAGNOSIS — A419 Sepsis, unspecified organism: Secondary | ICD-10-CM | POA: Diagnosis present

## 2015-07-22 DIAGNOSIS — R05 Cough: Secondary | ICD-10-CM

## 2015-07-22 DIAGNOSIS — N186 End stage renal disease: Secondary | ICD-10-CM | POA: Diagnosis present

## 2015-07-22 DIAGNOSIS — I2510 Atherosclerotic heart disease of native coronary artery without angina pectoris: Secondary | ICD-10-CM | POA: Diagnosis present

## 2015-07-22 DIAGNOSIS — Z515 Encounter for palliative care: Secondary | ICD-10-CM | POA: Diagnosis not present

## 2015-07-22 DIAGNOSIS — L89311 Pressure ulcer of right buttock, stage 1: Secondary | ICD-10-CM | POA: Diagnosis present

## 2015-07-22 DIAGNOSIS — J189 Pneumonia, unspecified organism: Secondary | ICD-10-CM

## 2015-07-22 DIAGNOSIS — Z7902 Long term (current) use of antithrombotics/antiplatelets: Secondary | ICD-10-CM | POA: Diagnosis not present

## 2015-07-22 DIAGNOSIS — H5441 Blindness, right eye, normal vision left eye: Secondary | ICD-10-CM | POA: Diagnosis present

## 2015-07-22 DIAGNOSIS — Z833 Family history of diabetes mellitus: Secondary | ICD-10-CM | POA: Diagnosis not present

## 2015-07-22 DIAGNOSIS — Y838 Other surgical procedures as the cause of abnormal reaction of the patient, or of later complication, without mention of misadventure at the time of the procedure: Secondary | ICD-10-CM | POA: Diagnosis present

## 2015-07-22 DIAGNOSIS — I959 Hypotension, unspecified: Secondary | ICD-10-CM | POA: Diagnosis present

## 2015-07-22 DIAGNOSIS — I739 Peripheral vascular disease, unspecified: Secondary | ICD-10-CM | POA: Diagnosis present

## 2015-07-22 DIAGNOSIS — R52 Pain, unspecified: Secondary | ICD-10-CM

## 2015-07-22 DIAGNOSIS — R6521 Severe sepsis with septic shock: Secondary | ICD-10-CM | POA: Diagnosis not present

## 2015-07-22 DIAGNOSIS — M25551 Pain in right hip: Secondary | ICD-10-CM | POA: Diagnosis present

## 2015-07-22 DIAGNOSIS — G629 Polyneuropathy, unspecified: Secondary | ICD-10-CM | POA: Diagnosis present

## 2015-07-22 DIAGNOSIS — R0902 Hypoxemia: Secondary | ICD-10-CM

## 2015-07-22 DIAGNOSIS — T829XXA Unspecified complication of cardiac and vascular prosthetic device, implant and graft, initial encounter: Secondary | ICD-10-CM

## 2015-07-22 LAB — BASIC METABOLIC PANEL
Anion gap: 13 (ref 5–15)
Anion gap: 15 (ref 5–15)
BUN: 38 mg/dL — AB (ref 6–20)
BUN: 39 mg/dL — AB (ref 6–20)
CHLORIDE: 98 mmol/L — AB (ref 101–111)
CHLORIDE: 99 mmol/L — AB (ref 101–111)
CO2: 24 mmol/L (ref 22–32)
CO2: 24 mmol/L (ref 22–32)
CREATININE: 5.53 mg/dL — AB (ref 0.61–1.24)
CREATININE: 5.67 mg/dL — AB (ref 0.61–1.24)
Calcium: 7.7 mg/dL — ABNORMAL LOW (ref 8.9–10.3)
Calcium: 7.9 mg/dL — ABNORMAL LOW (ref 8.9–10.3)
GFR calc Af Amer: 10 mL/min — ABNORMAL LOW (ref >60)
GFR calc Af Amer: 10 mL/min — ABNORMAL LOW (ref >60)
GFR calc non Af Amer: 9 mL/min — ABNORMAL LOW (ref >60)
GFR calc non Af Amer: 9 mL/min — ABNORMAL LOW (ref >60)
GLUCOSE: 220 mg/dL — AB (ref 65–99)
GLUCOSE: 163 mg/dL — AB (ref 65–99)
POTASSIUM: 2.4 mmol/L — AB (ref 3.5–5.1)
Potassium: 2.3 mmol/L — CL (ref 3.5–5.1)
SODIUM: 135 mmol/L (ref 135–145)
SODIUM: 138 mmol/L (ref 135–145)

## 2015-07-22 LAB — CBC WITH DIFFERENTIAL/PLATELET
Basophils Absolute: 0.1 10*3/uL (ref 0–0.1)
Basophils Relative: 1 %
EOS ABS: 0 10*3/uL (ref 0–0.7)
Eosinophils Relative: 0 %
HCT: 28.7 % — ABNORMAL LOW (ref 40.0–52.0)
HEMOGLOBIN: 8.6 g/dL — AB (ref 13.0–18.0)
LYMPHS ABS: 0.8 10*3/uL — AB (ref 1.0–3.6)
LYMPHS PCT: 7 %
MCH: 24.8 pg — AB (ref 26.0–34.0)
MCHC: 30.1 g/dL — AB (ref 32.0–36.0)
MCV: 82.3 fL (ref 80.0–100.0)
Monocytes Absolute: 0.9 10*3/uL (ref 0.2–1.0)
Monocytes Relative: 7 %
NEUTROS PCT: 85 %
Neutro Abs: 10.9 10*3/uL — ABNORMAL HIGH (ref 1.4–6.5)
Platelets: 304 10*3/uL (ref 150–440)
RBC: 3.49 MIL/uL — ABNORMAL LOW (ref 4.40–5.90)
RDW: 23.7 % — ABNORMAL HIGH (ref 11.5–14.5)
WBC: 12.9 10*3/uL — ABNORMAL HIGH (ref 3.8–10.6)

## 2015-07-22 MED ORDER — PIPERACILLIN-TAZOBACTAM 3.375 G IVPB
3.3750 g | Freq: Two times a day (BID) | INTRAVENOUS | Status: DC
  Administered 2015-07-22 – 2015-07-24 (×4): 3.375 g via INTRAVENOUS
  Filled 2015-07-22 (×5): qty 50

## 2015-07-22 MED ORDER — NALOXONE HCL 2 MG/2ML IJ SOSY
PREFILLED_SYRINGE | INTRAMUSCULAR | Status: AC
  Administered 2015-07-22: 2 mg via INTRAVENOUS
  Filled 2015-07-22: qty 2

## 2015-07-22 MED ORDER — POTASSIUM CHLORIDE CRYS ER 20 MEQ PO TBCR: 40.0000 meq | EXTENDED_RELEASE_TABLET | Freq: Once | ORAL | Status: DC

## 2015-07-22 MED ORDER — SODIUM CHLORIDE 0.9 % IV SOLN
2000.0000 mg | Freq: Once | INTRAVENOUS | Status: AC
  Administered 2015-07-22: 2000 mg via INTRAVENOUS
  Filled 2015-07-22: qty 2000

## 2015-07-22 MED ORDER — FERROUS SULFATE 325 (65 FE) MG PO TABS
325.0000 mg | ORAL_TABLET | Freq: Every day | ORAL | Status: DC
  Administered 2015-07-23 – 2015-08-04 (×9): 325 mg via ORAL
  Filled 2015-07-22 (×9): qty 1

## 2015-07-22 MED ORDER — LATANOPROST 0.005 % OP SOLN
1.0000 [drp] | Freq: Every day | OPHTHALMIC | Status: DC
  Administered 2015-07-22 – 2015-08-03 (×12): 1 [drp] via OPHTHALMIC
  Filled 2015-07-22: qty 2.5

## 2015-07-22 MED ORDER — OXYCODONE-ACETAMINOPHEN 5-325 MG PO TABS
2.0000 | ORAL_TABLET | Freq: Once | ORAL | Status: AC
  Administered 2015-07-22: 2 via ORAL
  Filled 2015-07-22: qty 2

## 2015-07-22 MED ORDER — PREGABALIN 75 MG PO CAPS
75.0000 mg | ORAL_CAPSULE | Freq: Every day | ORAL | Status: DC
  Administered 2015-07-23 – 2015-08-04 (×9): 75 mg via ORAL
  Filled 2015-07-22 (×9): qty 1

## 2015-07-22 MED ORDER — SENNA 8.6 MG PO TABS
17.2000 mg | ORAL_TABLET | Freq: Every day | ORAL | Status: DC
  Administered 2015-07-22 – 2015-07-30 (×8): 17.2 mg via ORAL
  Filled 2015-07-22 (×8): qty 2

## 2015-07-22 MED ORDER — ATORVASTATIN CALCIUM 20 MG PO TABS
20.0000 mg | ORAL_TABLET | Freq: Every day | ORAL | Status: DC
  Administered 2015-07-22 – 2015-08-04 (×11): 20 mg via ORAL
  Filled 2015-07-22 (×11): qty 1

## 2015-07-22 MED ORDER — SODIUM CHLORIDE 0.9 % IV SOLN
Freq: Once | INTRAVENOUS | Status: AC
  Administered 2015-07-22: 19:00:00 via INTRAVENOUS

## 2015-07-22 MED ORDER — DILTIAZEM HCL ER COATED BEADS 180 MG PO CP24
180.0000 mg | ORAL_CAPSULE | Freq: Every day | ORAL | Status: DC
  Administered 2015-07-22 – 2015-07-28 (×4): 180 mg via ORAL
  Filled 2015-07-22 (×5): qty 1

## 2015-07-22 MED ORDER — NALOXONE HCL 2 MG/2ML IJ SOSY
2.0000 mg | PREFILLED_SYRINGE | INTRAMUSCULAR | Status: DC | PRN
  Administered 2015-07-22: 2 mg via INTRAVENOUS
  Filled 2015-07-22: qty 2

## 2015-07-22 MED ORDER — ASPIRIN EC 81 MG PO TBEC
81.0000 mg | DELAYED_RELEASE_TABLET | Freq: Every day | ORAL | Status: DC
  Administered 2015-07-22 – 2015-08-04 (×11): 81 mg via ORAL
  Filled 2015-07-22 (×11): qty 1

## 2015-07-22 MED ORDER — CLOPIDOGREL BISULFATE 75 MG PO TABS
75.0000 mg | ORAL_TABLET | Freq: Every day | ORAL | Status: DC
  Administered 2015-07-23 – 2015-08-04 (×10): 75 mg via ORAL
  Filled 2015-07-22 (×10): qty 1

## 2015-07-22 MED ORDER — ALLOPURINOL 100 MG PO TABS
100.0000 mg | ORAL_TABLET | Freq: Every day | ORAL | Status: DC
  Administered 2015-07-23 – 2015-08-04 (×8): 100 mg via ORAL
  Filled 2015-07-22 (×8): qty 1

## 2015-07-22 MED ORDER — HEPARIN SODIUM (PORCINE) 5000 UNIT/ML IJ SOLN
5000.0000 [IU] | Freq: Three times a day (TID) | INTRAMUSCULAR | Status: DC
  Administered 2015-07-22 – 2015-08-04 (×36): 5000 [IU] via SUBCUTANEOUS
  Filled 2015-07-22 (×36): qty 1

## 2015-07-22 MED ORDER — VANCOMYCIN HCL IN DEXTROSE 1-5 GM/200ML-% IV SOLN
1000.0000 mg | INTRAVENOUS | Status: DC
  Filled 2015-07-22 (×5): qty 200

## 2015-07-22 MED ORDER — PIPERACILLIN-TAZOBACTAM 3.375 G IVPB
3.3750 g | Freq: Once | INTRAVENOUS | Status: DC
  Filled 2015-07-22: qty 50

## 2015-07-22 MED ORDER — POTASSIUM CHLORIDE CRYS ER 20 MEQ PO TBCR
40.0000 meq | EXTENDED_RELEASE_TABLET | Freq: Once | ORAL | Status: AC
  Administered 2015-07-22: 40 meq via ORAL
  Filled 2015-07-22: qty 2

## 2015-07-22 NOTE — ED Notes (Signed)
[facility] out of IV Zosyn. Pharmacy notified.

## 2015-07-22 NOTE — ED Notes (Signed)
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 The L side per family has been discontinued, the right is not in use. Pt receiving dialysis via permacath to L chest. Per MD, ok to leave in peripheral IV to R upper arm. No thrill or bruit present on either side.

## 2015-07-22 NOTE — H&P (Signed)
[facility]   PATIENT NAME: Sean Delgado    MR#:  [id number]  DATE OF BIRTH:  20-Jun-1934  DATE OF ADMISSION:  07/22/2015  PRIMARY CARE PHYSICIAN: Bobbye MortonSHARON A REILLY, MD   REQUESTING/REFERRING PHYSICIAN: malinda  CHIEF COMPLAINT:   Chief Complaint  Patient presents with  . Vascular Access Problem    dialysis catheter pulled out    HISTORY OF PRESENT ILLNESS: Sean Delgado  is a 82 y.o. male with a known history of end-stage renal disease on hemodialysis, hypertension, CHF, glaucoma, neuropathy, stroke, peripheral vascular disease, coronary artery disease, A. fib, right eye blindness- lives in a nursing home. For last 2-3 days he has increasing cough without some sputum production and he was somewhat confused and altered mental status so they decided to send him to emergency room. Patient went to dialysis Center today as is his regular dialysis day but they did not do any dialysis because of the above-mentioned complaint and send him to emergency room. They also noted that his permacath stitches out off and it is pulled out to half way. In ER patient was noted to have increased white cell count, tachycardia, some hypoxia, pneumonia and some pleural effusion on chest x-ray. Also had some hypotension.  He is given his admission for sepsis with pneumonia.  [name] daughter present in the room during the admission and she is also giving most of the history.  PAST MEDICAL HISTORY:   Past Medical History  Diagnosis Date  . ESRD (end stage renal disease) (HCC)   . Hypertension   . CHF (congestive heart failure) (HCC)   . Gout   . Glaucoma   . Neuropathy (HCC)   . Stroke (HCC)   . Peripheral vascular disease (HCC)   . Coronary artery disease   . A-fib (HCC)   . Dysrhythmia   . Blindness of right eye   . Anemia     iron deficiency    PAST SURGICAL HISTORY: Past Surgical History  Procedure Laterality Date  . Vascular surgery Bilateral      Carotid Endarterectomy  . Eye surgery Left     Cataract Extraction  . Av fistula placement Left 04/06/2015    Procedure: ARTERIOVENOUS (AV) FISTULA CREATION;  Surgeon: Annice NeedyJason S Dew, MD;  Location: [facility] ORS;  Service: Vascular;  Laterality: Left;  . Peripheral vascular catheterization N/A 04/12/2015    Procedure: Dialysis/Perma Catheter Insertion;  Surgeon: Annice NeedyJason S Dew, MD;  Location: [facility] INVASIVE CV LAB;  Service: Cardiovascular;  Laterality: N/A;  . Peripheral vascular catheterization N/A 05/18/2015    Procedure: Dialysis/Perma Catheter Insertion;  Surgeon: Annice NeedyJason S Dew, MD;  Location: [facility] INVASIVE CV LAB;  Service: Cardiovascular;  Laterality: N/A;  . Peripheral vascular catheterization Left 05/29/2015    Procedure: A/V Shuntogram/Fistulagram;  Surgeon: Annice NeedyJason S Dew, MD;  Location: [facility] INVASIVE CV LAB;  Service: Cardiovascular;  Laterality: Left;  . Peripheral vascular catheterization N/A 05/29/2015    Procedure: A/V Shunt Intervention;  Surgeon: Annice NeedyJason S Dew, MD;  Location: [facility] INVASIVE CV LAB;  Service: Cardiovascular;  Laterality: N/A;  . Peripheral vascular catheterization N/A 06/26/2015    Procedure: Dialysis/Perma Catheter Insertion;  Surgeon: Annice NeedyJason S Dew, MD;  Location: [facility] INVASIVE CV LAB;  Service: Cardiovascular;  Laterality: N/A;  . Av fistula placement Right 06/29/2015    Procedure: BRACHIAL CEPHALIC AV FISTULA;  Surgeon: Annice NeedyJason S Dew, MD;  Location: [facility] ORS;  Service: Vascular;  Laterality: Right;    SOCIAL HISTORY:  Social  History  Substance Use Topics  . Smoking status: Former Smoker -- 0.25 packs/day for 15 years    Types: Cigarettes    Quit date: 09/06/2006  . Smokeless tobacco: Never Used  . Alcohol Use: No    FAMILY HISTORY:  Family History  Problem Relation Age of Onset  . Diabetes Mother     DRUG ALLERGIES: No Known Allergies  REVIEW OF SYSTEMS:   CONSTITUTIONAL: No fever, fatigue or weakness.  EYES: No blurred or double vision.  EARS, NOSE, AND  THROAT: No tinnitus or ear pain.  RESPIRATORY: Positive for cough, shortness of breath, no wheezing or hemoptysis.  CARDIOVASCULAR: No chest pain, orthopnea, edema.  GASTROINTESTINAL: No nausea, vomiting, diarrhea or abdominal pain.  GENITOURINARY: No dysuria, hematuria.  ENDOCRINE: No polyuria, nocturia,  HEMATOLOGY: No anemia, easy bruising or bleeding SKIN: No rash or lesion. MUSCULOSKELETAL: No joint pain or arthritis.   NEUROLOGIC: No tingling, numbness, weakness.  PSYCHIATRY: No anxiety or depression.   MEDICATIONS AT HOME:  Prior to Admission medications   Medication Sig Start Date End Date Taking? Authorizing Provider  acetaminophen (TYLENOL) 325 MG tablet Take 650 mg by mouth every 6 (six) hours as needed for mild pain or moderate pain. Reported on 06/23/2015    Historical Provider, MD  allopurinol (ZYLOPRIM) 100 MG tablet Take 100 mg by mouth daily.    Historical Provider, MD  aspirin EC 81 MG tablet Take 81 mg by mouth daily. Reported on 06/23/2015    Historical Provider, MD  atorvastatin (LIPITOR) 20 MG tablet Take 1 tablet (20 mg total) by mouth daily. 04/17/15   Enid Baas, MD  b complex-vitamin c-folic acid (NEPHRO-VITE) 0.8 MG TABS tablet Take 1 tablet by mouth daily.    Historical Provider, MD  brimonidine (ALPHAGAN) 0.2 % ophthalmic solution Place 1 drop into the left eye 2 (two) times daily.    Historical Provider, MD  clopidogrel (PLAVIX) 75 MG tablet Take 75 mg by mouth daily.    Historical Provider, MD  diltiazem (CARDIZEM CD) 180 MG 24 hr capsule Take 1 capsule (180 mg total) by mouth daily. 04/17/15   Enid Baas, MD  ferrous sulfate 325 (65 FE) MG EC tablet Take 325 mg by mouth daily.    Historical Provider, MD  furosemide (LASIX) 40 MG tablet Take 1 tablet (40 mg total) by mouth 2 (two) times daily. 04/17/15   Enid Baas, MD  HYDROcodone-acetaminophen (NORCO) 5-325 MG tablet Take 1 tablet by mouth every 6 (six) hours as needed for moderate pain.  06/29/15   Annice Needy, MD  HYDROcodone-acetaminophen (NORCO/VICODIN) 5-325 MG tablet Take 1 tablet by mouth every 6 (six) hours as needed for moderate pain. 06/05/15   Delfino Lovett, MD  isosorbide mononitrate (IMDUR) 60 MG 24 hr tablet Take 60 mg by mouth daily.    Historical Provider, MD  latanoprost (XALATAN) 0.005 % ophthalmic solution Place 1 drop into both eyes at bedtime.    Historical Provider, MD  lidocaine (LIDODERM) 5 % Place 1 patch onto the skin daily. Apply to right knee at 6am. Remove & Discard patch within 12 hours or as directed by MD    Historical Provider, MD  metoprolol (TOPROL-XL) 200 MG 24 hr tablet Take 200 mg by mouth daily.    Historical Provider, MD  pregabalin (LYRICA) 75 MG capsule Take 75 mg by mouth daily.     Historical Provider, MD  senna (SENOKOT) 8.6 MG TABS tablet Take 17.2 mg by mouth at bedtime. *note dose*  Historical Provider, MD  sodium bicarbonate 650 MG tablet Take 650 mg by mouth 2 (two) times daily.    Historical Provider, MD      PHYSICAL EXAMINATION:   VITAL SIGNS: Blood pressure 91/50, pulse 76, temperature 98.3 F (36.8 C), temperature source Oral, resp. rate 20, height 6\' 1"  (1.854 m), weight 98.204 kg (216 lb 8 oz), SpO2 92 %.  GENERAL:  82 y.o.-year-old patient lying in the bed with no acute distress.  EYES: Pupils equal, round, reactive to light and accommodation. No scleral icterus. Extraocular muscles intact.  HEENT: Head atraumatic, normocephalic. Oropharynx and nasopharynx clear.  NECK:  Supple, no jugular venous distention. No thyroid enlargement, no tenderness.  LUNGS: Normal breath sounds bilaterally, no wheezing, some crepitation. No use of accessory muscles of respiration. Using nasal cannula oxygen supplementation. CARDIOVASCULAR: S1, S2 normal. No murmurs, rubs, or gallops.  ABDOMEN: Soft, nontender, nondistended. Bowel sounds present. No organomegaly or mass.  EXTREMITIES: No pedal edema, cyanosis, or clubbing.  NEUROLOGIC:  Cranial nerves II through XII are intact. Muscle strength 4/5 in upper extremities 3/5 in lower extremities. Sensation intact. Gait not checked.  PSYCHIATRIC: The patient is alert and oriented x 3.  SKIN: No obvious rash, lesion, or ulcer.   LABORATORY PANEL:   CBC  Recent Labs Lab 07/22/15 1506  WBC 12.9*  HGB 8.6*  HCT 28.7*  PLT 304  MCV 82.3  MCH 24.8*  MCHC 30.1*  RDW 23.7*  LYMPHSABS 0.8*  MONOABS 0.9  EOSABS 0.0  BASOSABS 0.1   ------------------------------------------------------------------------------------------------------------------  Chemistries   Recent Labs Lab 07/22/15 1506 07/22/15 1628  NA 135 138  K 2.3* 2.4*  CL 98* 99*  CO2 24 24  GLUCOSE 220* 163*  BUN 38* 39*  CREATININE 5.53* 5.67*  CALCIUM 7.7* 7.9*   ------------------------------------------------------------------------------------------------------------------ estimated creatinine clearance is 13 mL/min (by C-G formula based on Cr of 5.67). ------------------------------------------------------------------------------------------------------------------ No results for input(s): TSH, T4TOTAL, T3FREE, THYROIDAB in the last 72 hours.  Invalid input(s): FREET3   Coagulation profile No results for input(s): INR, PROTIME in the last 168 hours. ------------------------------------------------------------------------------------------------------------------- No results for input(s): DDIMER in the last 72 hours. -------------------------------------------------------------------------------------------------------------------  Cardiac Enzymes No results for input(s): CKMB, TROPONINI, MYOGLOBIN in the last 168 hours.  Invalid input(s): CK ------------------------------------------------------------------------------------------------------------------ Invalid input(s):  POCBNP  ---------------------------------------------------------------------------------------------------------------  Urinalysis    Component Value Date/Time   Lavenia Atlas NEGATIVE 05/30/2015 0940     RADIOLOGY: Dg Chest 1 View  07/22/2015  CLINICAL DATA:  Pt with end stage renal disease. Pt to ED via EMS from [facility] c/o dialysis catheter having been pulled out approximately 5-6 inches. Pt is verbally unresponsive except to basic commands. EXAM: CHEST  1 VIEW COMPARISON:  06/24/2015 FINDINGS: The tunneled left IJ hemodialysis catheter has its tips in the proximal right atrium. No pneumothorax. Confluent Opacities in the left mid and lower lung probably combination of effusion and adjacent parenchymal consolidation/ atelectasis. Right lung clear. Heart size difficult to assess due to adjacent opacities. No effusion. Atheromatous aorta. IMPRESSION: 1. Left IJ dialysis catheter projects in expected location. 2. Left lower lung consolidation/atelectasis with effusion. Electronically Signed   By: Corlis Leak M.D.   On: 07/22/2015 14:35    EKG: Orders placed or performed during the hospital encounter of 06/24/15  . ED EKG  . ED EKG    IMPRESSION AND PLAN:  * Sepsis secondary to pneumonia most likely aspiration.   IV vancomycin and Zosyn for now as patient is a dialysis patient and he  is from nursing home.  Collect sputum sample.  Sepsis is evident by hypotension, tachypnea, hypoxia, elevated white cell count.  Currently blood pressure is slightly stable we'll continue monitoring.  * Aspiration pneumonia  As mentioned above frank and Zosyn for now and we can change quickly if patient has improvement in condition.  Follow the cultures.  Patient was advised to have only honey thick liquids but he says that sometimes he drink regular liquids and daughter also agrees with that.  We'll get speech and swallow evaluation again over year.  * hypokalemia   replace oral  potassium once and follow her as patient is a dialysis patient.  * End-stage renal disease on hemodialysis  Dialysis days are Tuesday Thursday and Saturday.  He missed dialysis today as his permacath is half way pulled out.  He does not appear in fluid overload and currently hypotensive so he does not require emergent dialysis.  I place a nephrology consult to manage with this issue.  * hypertension   hold his medications as blood pressure is running on lower normal side.  All the records are reviewed and case discussed with ED provider. Management plans discussed with the patient, family and they are in agreement.  CODE STATUS: Code Status History    Date Active Date Inactive Code Status Order ID Comments User Context   06/24/2015  4:07 PM 06/27/2015 12:37 AM Full Code [id number]  Auburn Bilberry, MD Inpatient   05/30/2015  5:22 PM 06/05/2015  6:42 PM Full Code [id number]  Trudee Kuster, RN Inpatient   05/30/2015  1:59 PM 05/30/2015  5:22 PM DNR [id number]  Shaune Pollack, MD Inpatient   05/29/2015  3:57 PM 05/29/2015  7:32 PM Full Code [id number]  Annice Needy, MD Inpatient   04/08/2015  8:31 PM 04/17/2015 11:42 PM Full Code [id number]  Gracelyn Nurse, MD Inpatient   03/04/2015  4:13 PM 03/11/2015  7:00 PM DNR [id number]  Houston Siren, MD Inpatient       TOTAL TIME TAKING CARE OF THIS PATIENT: 50 critical care minutes.   condition is critical because of presence of sepsis. Discussed with his daughter present in the room.   Altamese Dilling M.D on 07/22/2015   Between 7am to 6pm - Pager - [phone]  After 6pm go to [email or web address] - password [email or web address]  [facility]  Office  [phone]  CC: Primary care physician; Bobbye Morton, MD   Note: This dictation was prepared with Dragon dictation along with smaller phrase technology. Any transcriptional errors that result from this process are unintentional.

## 2015-07-22 NOTE — ED Notes (Signed)
Pt's next of kin Percell Locusnore Burnette phone # [phone] (home). She asked to be notified of patient changes and when he is ready to be transferred. Pt in agreement.

## 2015-07-22 NOTE — ED Notes (Signed)
[facility] c/o dialysis catheter having been pulled out approximately 5-6 inches. Per EMS pt stays [facility] and has some confusion.

## 2015-07-22 NOTE — ED Notes (Signed)
Pt is Tue-Thurs-Sat schedule for dialysis. States he does not know how line came out.

## 2015-07-22 NOTE — Progress Notes (Signed)
ANTIBIOTIC CONSULT NOTE - INITIAL  Pharmacy Consult for piperacillin/tazobactam and vancomycin Indication: pneumonia  No Known Allergies  Patient Measurements: Height: 6\' 1"  (185.4 cm) Weight: 216 lb 8 oz (98.204 kg) IBW/kg (Calculated) : 79.9 Adjusted Body Weight: 87 kg  Vital Signs: Temp: 98 F (36.7 C) (01/14 2030) Temp Source: Oral (01/14 2030) BP: 108/53 mmHg (01/14 2030) Pulse Rate: 89 (01/14 2030) Intake/Output from previous day:   Intake/Output from this shift:    Labs:  Recent Labs  07/22/15 1506 07/22/15 1628  WBC 12.9*  --   HGB 8.6*  --   PLT 304  --   CREATININE 5.53* 5.67*   Estimated Creatinine Clearance: 13 mL/min (by C-G formula based on Cr of 5.67). No results for input(s): VANCOTROUGH, VANCOPEAK, VANCORANDOM, GENTTROUGH, GENTPEAK, GENTRANDOM, TOBRATROUGH, TOBRAPEAK, TOBRARND, AMIKACINPEAK, AMIKACINTROU, AMIKACIN in the last 72 hours.   Microbiology: Recent Results (from the past 720 hour(s))  Surgical pcr screen     Status: None   Collection Time: 06/23/15 11:52 AM  Result Value Ref Range Status   MRSA, PCR NEGATIVE NEGATIVE Final   Staphylococcus aureus NEGATIVE NEGATIVE Final    Comment:        The Xpert SA Assay (FDA approved for NASAL specimens in patients over 80 years of age), is one component of a comprehensive surveillance program.  Test performance has been validated by [facility] for patients greater than or equal to 80 year old. It is not intended to diagnose infection nor to guide or monitor treatment.   MRSA PCR Screening     Status: None   Collection Time: 06/24/15  3:48 PM  Result Value Ref Range Status   MRSA by PCR NEGATIVE NEGATIVE Final    Comment:        The GeneXpert MRSA Assay (FDA approved for NASAL specimens only), is one component of a comprehensive MRSA colonization surveillance program. It is not intended to diagnose MRSA infection nor to guide or monitor treatment for MRSA infections.     Assessment: Pharmacy consulted to dose vancomycin and piperacillin/tazobactam in this 82 year old male being admitted with pneumonia. Patient is on HD with a normal schedule of TTS. No plans to dialyze patient tonight at this time per notes.   Adjusted body weight = 87 kg  Goal of Therapy:  Vancomycin trough level 15-20 mcg/ml  Plan:  Measure antibiotic drug levels at steady state Follow up culture results  Start piperacillin/tazobactam 3.375 g IV q 12 hours  Give vancomycin loading dose of 2 g x1 tonight (~22 mg/kg) Will order vancomycin 1000 mg to be given at the end of dialysis on TTS  Pharmacy to follow HD schedule and order vancomycin trough prior to 3rd HD session. Thank you for the consult.  Jodelle RedMary M Evamae Rowen 07/22/2015,8:53 PM

## 2015-07-22 NOTE — ED Notes (Signed)
Pt given x1 dose Narcan IV per MD order d/t hypotension and unresponsiveness. Pt now alert, answering questions appropriately. BP 94/50.

## 2015-07-22 NOTE — ED Provider Notes (Signed)
I go into the room to check on the patient. Patient is very somnolent with hypotension blood pressure in the low 80s. We gave him some Narcan and he wakes up at his blood pressure only goes up into the low 90s. He is unusual for him per his family. Chest x-ray actually shows what appears to be a left-sided infiltrate and/or effusion. He does have an elevated white blood count. I reports he's been coughing a lot here lately and has been given water instead of thickened water like exposed to. I will admit him for possible pneumonia.  Arnaldo NatalPaul F Elyshia Kumagai, MD 07/22/15 [phone]

## 2015-07-22 NOTE — ED Provider Notes (Signed)
[facility] Emergency Department Provider Note     Time seen: ----------------------------------------- 2:51 PM on 07/22/2015 -----------------------------------------    I have reviewed the triage vital signs and the nursing notes.   HISTORY  Chief Complaint Vascular Access Problem    HPI Sean Nest. is a 82 y.o. male who presents ER from [facility]. According to report his dialysis catheter has been pulled out slightly. Patient denies any complaints, Sean Delgado states he's had some confusion. Patient states she does not know how the line came out.   Past Medical History  Diagnosis Date  . ESRD (end stage renal disease) (HCC)   . Hypertension   . CHF (congestive heart failure) (HCC)   . Gout   . Glaucoma   . Neuropathy (HCC)   . Stroke (HCC)   . Peripheral vascular disease (HCC)   . Coronary artery disease   . A-fib (HCC)   . Dysrhythmia   . Blindness of right eye   . Anemia     iron deficiency    Patient Active Problem List   Diagnosis Date Noted  . Hemodialysis catheter dysfunction (HCC) 06/24/2015  . Pressure ulcer 05/31/2015  . Sepsis (HCC) 05/30/2015  . Hypoxia 05/30/2015  . Acute respiratory failure (HCC) 04/08/2015  . CHF (congestive heart failure) (HCC) 03/04/2015    Past Surgical History  Procedure Laterality Date  . Vascular surgery Bilateral     Carotid Endarterectomy  . Eye surgery Left     Cataract Extraction  . Av fistula placement Left 04/06/2015    Procedure: ARTERIOVENOUS (AV) FISTULA CREATION;  Surgeon: Annice Needy, MD;  Location: [facility] ORS;  Service: Vascular;  Laterality: Left;  . Peripheral vascular catheterization N/A 04/12/2015    Procedure: Dialysis/Perma Catheter Insertion;  Surgeon: Annice Needy, MD;  Location: [facility] INVASIVE CV LAB;  Service: Cardiovascular;  Laterality: N/A;  . Peripheral vascular catheterization N/A 05/18/2015    Procedure: Dialysis/Perma Catheter Insertion;   Surgeon: Annice Needy, MD;  Location: [facility] INVASIVE CV LAB;  Service: Cardiovascular;  Laterality: N/A;  . Peripheral vascular catheterization Left 05/29/2015    Procedure: A/V Shuntogram/Fistulagram;  Surgeon: Annice Needy, MD;  Location: [facility] INVASIVE CV LAB;  Service: Cardiovascular;  Laterality: Left;  . Peripheral vascular catheterization N/A 05/29/2015    Procedure: A/V Shunt Intervention;  Surgeon: Annice Needy, MD;  Location: [facility] INVASIVE CV LAB;  Service: Cardiovascular;  Laterality: N/A;  . Peripheral vascular catheterization N/A 06/26/2015    Procedure: Dialysis/Perma Catheter Insertion;  Surgeon: Annice Needy, MD;  Location: [facility] INVASIVE CV LAB;  Service: Cardiovascular;  Laterality: N/A;  . Av fistula placement Right 06/29/2015    Procedure: BRACHIAL CEPHALIC AV FISTULA;  Surgeon: Annice Needy, MD;  Location: [facility] ORS;  Service: Vascular;  Laterality: Right;    Allergies Review of patient's allergies indicates no known allergies.  Social History Social History  Substance Use Topics  . Smoking status: Former Smoker -- 0.25 packs/day for 15 years    Types: Cigarettes    Quit date: 09/06/2006  . Smokeless tobacco: Never Used  . Alcohol Use: No    Review of Systems Constitutional: Negative for fever. Eyes: Negative for visual changes. ENT: Negative for sore throat. Cardiovascular: Negative for chest pain. Respiratory: Negative for shortness of breath. Gastrointestinal: Negative for abdominal pain, vomiting and diarrhea. Genitourinary: Negative for dysuria. Musculoskeletal: Negative for back pain. Skin: Negative for rash. Neurological: Negative for headaches, focal weakness or numbness.  10-point ROS  otherwise negative.  ____________________________________________   PHYSICAL EXAM:  VITAL SIGNS: ED Triage Vitals  Enc Vitals Group     BP 07/22/15 1402 117/62 mmHg     Pulse Rate 07/22/15 1402 76     Resp 07/22/15 1402 27     Temp 07/22/15 1402 98.3 F (36.8 C)      Temp Source 07/22/15 1402 Oral     SpO2 07/22/15 1402 93 %     Weight 07/22/15 1402 216 lb 8 oz (98.204 kg)     Height 07/22/15 1402 6\' 1"  (1.854 m)     Head Cir --      Peak Flow --      Pain Score --      Pain Loc --      Pain Edu? --      Excl. in GC? --    Constitutional: Alert, Well appearing and in no distress. Eyes: Conjunctivae are normal. PERRL. Normal extraocular movements. ENT   Head: Normocephalic and atraumatic.   Nose: No congestion/rhinnorhea.   Mouth/Throat: Mucous membranes are moist.   Neck: No stridor. Cardiovascular: Normal rate, regular rhythm. Normal and symmetric distal pulses are present in all extremities. No murmurs, rubs, or gallops. Respiratory: Normal respiratory effort without tachypnea nor retractions. Breath sounds are clear and equal bilaterally.  Gastrointestinal: Soft nontender nondistended. Positive bowel sounds Musculoskeletal: Nontender with normal range of motion in all extremities. Dialysis catheter present in the left upper chest wall Neurologic:  Normal speech and language. No gross focal neurologic deficits are appreciated.  Skin:  Skin is warm, dry and intact. No rash noted. Psychiatric: Mood and affect are normal. Speech and behavior are normal. Patient exhibits appropriate insight and judgment. ____________________________________________  ED COURSE:  Pertinent labs & imaging results that were available during my care of the patient were reviewed by me and considered in my medical decision making (see chart for details). The sutures that hold the dialysis catheter in place are not intact. I will discussed with nephrology. ____________________________________________    LABS (pertinent positives/negatives)  Labs Reviewed  CBC WITH DIFFERENTIAL/PLATELET - Abnormal; Notable for the following:    WBC 12.9 (*)    RBC 3.49 (*)    Hemoglobin 8.6 (*)    HCT 28.7 (*)    MCH 24.8 (*)    MCHC 30.1 (*)    RDW 23.7 (*)     Neutro Abs 10.9 (*)    Lymphs Abs 0.8 (*)    All other components within normal limits  BASIC METABOLIC PANEL - Abnormal; Notable for the following:    Potassium 2.3 (*)    Chloride 98 (*)    Glucose, Bld 220 (*)    BUN 38 (*)    Creatinine, Ser 5.53 (*)    Calcium 7.7 (*)    GFR calc non Af Amer 9 (*)    GFR calc Af Amer 10 (*)    All other components within normal limits  BASIC METABOLIC PANEL    RADIOLOGY Images were viewed by me  Chest x-ray IMPRESSION: 1. Left IJ dialysis catheter projects in expected location. 2. Left lower lung consolidation/atelectasis with effusion. ____________________________________________  FINAL ASSESSMENT AND PLAN  Dialysis catheter problem,  Plan: Patient with labs and imaging as dictated above. Patient is in no acute distress, I placed Steri-Strips around the IJ insertion site. Patient did not receive dialysis today, according to the nephrologist we can check his labs and if his electrolytes look okay can be dialysed on Monday. His  final labs are pending at this time. Patient is checked out to Dr. Darnelle CatalanMalinda.   Emily FilbertWilliams, Jahmya Onofrio E, MD   Emily FilbertJonathan E Jearlene Bridwell, MD 07/22/15 [phone]

## 2015-07-23 ENCOUNTER — Inpatient Hospital Stay: Payer: Medicare (Managed Care)

## 2015-07-23 ENCOUNTER — Encounter: Payer: Self-pay | Admitting: Nurse Practitioner

## 2015-07-23 LAB — BLOOD CULTURE ID PANEL (REFLEXED)
Acinetobacter baumannii: NOT DETECTED
CARBAPENEM RESISTANCE: NOT DETECTED
Candida albicans: NOT DETECTED
Candida glabrata: NOT DETECTED
Candida krusei: NOT DETECTED
Candida parapsilosis: NOT DETECTED
Candida tropicalis: NOT DETECTED
Enterobacter cloacae complex: NOT DETECTED
Enterobacteriaceae species: NOT DETECTED
Enterococcus species: NOT DETECTED
Escherichia coli: NOT DETECTED
HAEMOPHILUS INFLUENZAE: NOT DETECTED
Klebsiella oxytoca: NOT DETECTED
Klebsiella pneumoniae: NOT DETECTED
Listeria monocytogenes: NOT DETECTED
METHICILLIN RESISTANCE: DETECTED — AB
NEISSERIA MENINGITIDIS: NOT DETECTED
PROTEUS SPECIES: NOT DETECTED
PSEUDOMONAS AERUGINOSA: NOT DETECTED
SERRATIA MARCESCENS: NOT DETECTED
STAPHYLOCOCCUS AUREUS BCID: DETECTED — AB
STAPHYLOCOCCUS SPECIES: DETECTED — AB
Streptococcus agalactiae: NOT DETECTED
Streptococcus pneumoniae: NOT DETECTED
Streptococcus pyogenes: NOT DETECTED
Streptococcus species: NOT DETECTED
Vancomycin resistance: NOT DETECTED

## 2015-07-23 LAB — BASIC METABOLIC PANEL
ANION GAP: 10 (ref 5–15)
BUN: 42 mg/dL — ABNORMAL HIGH (ref 6–20)
CALCIUM: 7.5 mg/dL — AB (ref 8.9–10.3)
CO2: 27 mmol/L (ref 22–32)
Chloride: 100 mmol/L — ABNORMAL LOW (ref 101–111)
Creatinine, Ser: 5.83 mg/dL — ABNORMAL HIGH (ref 0.61–1.24)
GFR calc Af Amer: 10 mL/min — ABNORMAL LOW (ref >60)
GFR, EST NON AFRICAN AMERICAN: 8 mL/min — AB (ref >60)
GLUCOSE: 100 mg/dL — AB (ref 65–99)
Potassium: 2.7 mmol/L — CL (ref 3.5–5.1)
Sodium: 137 mmol/L (ref 135–145)

## 2015-07-23 LAB — CBC
HCT: 29.5 % — ABNORMAL LOW (ref 40.0–52.0)
HEMOGLOBIN: 8.9 g/dL — AB (ref 13.0–18.0)
MCH: 26 pg (ref 26.0–34.0)
MCHC: 30.3 g/dL — AB (ref 32.0–36.0)
MCV: 85.9 fL (ref 80.0–100.0)
Platelets: 268 10*3/uL (ref 150–440)
RBC: 3.43 MIL/uL — ABNORMAL LOW (ref 4.40–5.90)
RDW: 24.1 % — AB (ref 11.5–14.5)
WBC: 10.4 10*3/uL (ref 3.8–10.6)

## 2015-07-23 LAB — MAGNESIUM: MAGNESIUM: 1.9 mg/dL (ref 1.7–2.4)

## 2015-07-23 LAB — MRSA PCR SCREENING: MRSA BY PCR: POSITIVE — AB

## 2015-07-23 MED ORDER — POTASSIUM CHLORIDE CRYS ER 20 MEQ PO TBCR
40.0000 meq | EXTENDED_RELEASE_TABLET | ORAL | Status: AC
  Administered 2015-07-23 (×2): 40 meq via ORAL
  Filled 2015-07-23 (×2): qty 2

## 2015-07-23 MED ORDER — POTASSIUM CHLORIDE 10 MEQ/100ML IV SOLN
10.0000 meq | INTRAVENOUS | Status: AC
  Administered 2015-07-23 (×4): 10 meq via INTRAVENOUS
  Filled 2015-07-23 (×4): qty 100

## 2015-07-23 MED ORDER — MAGNESIUM SULFATE 2 GM/50ML IV SOLN
2.0000 g | Freq: Once | INTRAVENOUS | Status: AC
  Administered 2015-07-23: 2 g via INTRAVENOUS
  Filled 2015-07-23: qty 50

## 2015-07-23 NOTE — Progress Notes (Signed)
Patient ID: Sean Kille., male   DOB: 1934-02-03, 82 y.o.   MRN: [id number] [facility] PROGRESS NOTE  PCP: Bobbye Morton, MD  HPI/Subjective: Patient with some cough. Patient with right hip pain. States his breathing is okay. As per the daughter, she is not sure how strict they are with the thickened liquids at the facility.  Objective: Filed Vitals:   07/23/15 0608 07/23/15 0624  BP: 95/39 98/48  Pulse: 62   Temp: 98 F (36.7 C)   Resp: 18     Filed Weights   07/22/15 1402 07/22/15 2030  Weight: 98.204 kg (216 lb 8 oz) 93.26 kg (205 lb 9.6 oz)    ROS: Review of Systems  Constitutional: Negative for fever and chills.  Eyes: Negative for blurred vision.  Respiratory: Positive for cough. Negative for shortness of breath.   Cardiovascular: Negative for chest pain.  Gastrointestinal: Negative for nausea, vomiting, abdominal pain, diarrhea and constipation.  Genitourinary: Negative for dysuria.  Musculoskeletal: Positive for joint pain.  Neurological: Negative for dizziness and headaches.   Exam: Physical Exam  HENT:  Nose: No mucosal edema.  Mouth/Throat: No oropharyngeal exudate or posterior oropharyngeal edema.  Eyes: Conjunctivae, EOM and lids are normal. Pupils are equal, round, and reactive to light.  Neck: No JVD present. Carotid bruit is not present. No edema present. No thyroid mass and no thyromegaly present.  Cardiovascular: S1 normal and S2 normal.  Exam reveals no gallop.   Murmur heard.  Systolic murmur is present with a grade of 2/6  Pulses:      Dorsalis pedis pulses are 2+ on the right side, and 2+ on the left side.  Respiratory: No respiratory distress. He has no wheezes. He has rhonchi in the right middle field, the right lower field, the left middle field and the left lower field. He has no rales.  GI: Soft. Bowel sounds are normal. There is no tenderness.  Musculoskeletal:       Right ankle: He exhibits swelling.       Left ankle:  He exhibits swelling.  Pain when flexing the right hip.  Lymphadenopathy:    He has no cervical adenopathy.  Neurological: He is alert.  Skin: Skin is warm. Nails show no clubbing.  Stage I decubitus as per nurse on the right buttock. Dry skin on the lower extremities.  Psychiatric: He has a normal mood and affect.      Data Reviewed: Basic Metabolic Panel:  Recent Labs Lab 07/22/15 1506 07/22/15 1628 07/23/15 0451  NA 135 138 137  K 2.3* 2.4* 2.7*  CL 98* 99* 100*  CO2 24 24 27   GLUCOSE 220* 163* 100*  BUN 38* 39* 42*  CREATININE 5.53* 5.67* 5.83*  CALCIUM 7.7* 7.9* 7.5*  MG  --   --  1.9  CBC:  Recent Labs Lab 07/22/15 1506 07/23/15 0451  WBC 12.9* 10.4  NEUTROABS 10.9*  --   HGB 8.6* 8.9*  HCT 28.7* 29.5*  MCV 82.3 85.9  PLT 304 268    Recent Results (from the past 240 hour(s))  MRSA PCR Screening     Status: Abnormal   Collection Time: 07/23/15  5:24 AM  Result Value Ref Range Status   MRSA by PCR POSITIVE (A) NEGATIVE Final    Comment:        The GeneXpert MRSA Assay (FDA approved for NASAL specimens only), is one component of a comprehensive MRSA colonization surveillance program. It is not intended to diagnose MRSA  infection nor to guide or monitor treatment for MRSA infections. CRITICAL RESULT CALLED TO, READ BACK BY AND VERIFIED WITH: ALISHA BABB ON 07/23/15 AT 0735 BY QSD      Studies: Dg Chest 1 View  07/22/2015  CLINICAL DATA:  Pt with end stage renal disease. Pt to ED via EMS from [facility] c/o dialysis catheter having been pulled out approximately 5-6 inches. Pt is verbally unresponsive except to basic commands. EXAM: CHEST  1 VIEW COMPARISON:  06/24/2015 FINDINGS: The tunneled left IJ hemodialysis catheter has its tips in the proximal right atrium. No pneumothorax. Confluent Opacities in the left mid and lower lung probably combination of effusion and adjacent parenchymal consolidation/ atelectasis. Right lung clear. Heart  size difficult to assess due to adjacent opacities. No effusion. Atheromatous aorta. IMPRESSION: 1. Left IJ dialysis catheter projects in expected location. 2. Left lower lung consolidation/atelectasis with effusion. Electronically Signed   By: Corlis Leak  Hassell M.D.   On: 07/22/2015 14:35    Scheduled Meds: . allopurinol  100 mg Oral Daily  . aspirin EC  81 mg Oral Daily  . atorvastatin  20 mg Oral Daily  . clopidogrel  75 mg Oral Daily  . diltiazem  180 mg Oral Daily  . ferrous sulfate  325 mg Oral Daily  . heparin  5,000 Units Subcutaneous 3 times per day  . latanoprost  1 drop Both Eyes QHS  . piperacillin-tazobactam (ZOSYN)  IV  3.375 g Intravenous Q12H  . pregabalin  75 mg Oral Daily  . senna  17.2 mg Oral QHS  . [START ON 07/25/2015] vancomycin  1,000 mg Intravenous Q T,Th,Sa-HD    Assessment/Plan:  1. Clinical sepsis, aspiration pneumonia. Hypotension, tachypnea, hypoxia and leukocytosis. Patient placed on IV vancomycin and Zosyn. Swallow evaluation dysphagia diet ordered. 2. Hypokalemia and hypomagnesemia replace magnesium IV and potassium IV and orally 3. End-stage renal disease on dialysis Tuesday Thursday and Saturday. Permacath removed. Nephrology consultation. Will need vascular consultation for access 4. Hypotension hold his blood pressure medications 5. Peripheral vascular disease on aspirin and Plavix 6. Hyperlipidemia unspecified on atorvastatin 7. History of gout on allopurinol 8. Glaucoma unspecified continue latanoprost 9. Anemia of chronic disease Procrit with dialysis and continue iron supplementation  Code Status:     Code Status Orders        Start     Ordered   07/22/15 2038  Full code   Continuous     07/22/15 2038    Code Status History    Date Active Date Inactive Code Status Order ID Comments User Context   06/24/2015  4:07 PM 06/27/2015 12:37 AM Full Code [id number]  Auburn BilberryShreyang Patel, MD Inpatient   05/30/2015  5:22 PM 06/05/2015  6:42 PM Full Code  [id number]  Trudee KusterBrandi R Mansfield, RN Inpatient   05/30/2015  1:59 PM 05/30/2015  5:22 PM DNR [id number]  Shaune PollackQing Chen, MD Inpatient   05/29/2015  3:57 PM 05/29/2015  7:32 PM Full Code [id number]  Annice NeedyJason S Dew, MD Inpatient   04/08/2015  8:31 PM 04/17/2015 11:42 PM Full Code [id number]  Gracelyn NurseJohn D Johnston, MD Inpatient   03/04/2015  4:13 PM 03/11/2015  7:00 PM DNR [id number]  Houston SirenVivek J Sainani, MD Inpatient     Family Communication: Spoke with daughter on phone [facility]  Consultants:  Nephrology  Vascular surgery  Antibiotics:  Vancomycin  Zosyn  Time spent: 25 minutes  Alford HighlandWIETING, Elyce Zollinger  [facility]

## 2015-07-23 NOTE — Progress Notes (Signed)
Discovered two lidoderm patches on patient - one on left knee and one on left buttock. Removed both patches. The patch on his knee was labeled 1/14.

## 2015-07-23 NOTE — Progress Notes (Signed)
Subjective:  Patient known to our practice from previous admissions. He dialyzes at [facility] Tuesday/Thursday/Saturday. His last dialysis was on Thursday. On Saturday, he was sent from the dialysis center because his PermCath was out of place and had slighted out about 4-5 cm. He had a new PermCath placed in December At present, he does not have any acute shortness of breath. He denies cough or sputum His chest x-ray shows that left IJ dialysis catheter is in the expected location. Left lower lung appears to have consolidation Patient does not have any fever He did have slightly elevated WBC count Potassium level is low at 2.7   Objective:  Vital signs in last 24 hours:  Temp:  [98 F (36.7 C)-98.3 F (36.8 C)] 98 F (36.7 C) (01/15 0608) Pulse Rate:  [62-104] 62 (01/15 0608) Resp:  [15-27] 18 (01/15 0608) BP: (78-126)/(39-62) 98/48 mmHg (01/15 0624) SpO2:  [90 %-95 %] 93 % (01/15 0608) Weight:  [93.26 kg (205 lb 9.6 oz)-98.204 kg (216 lb 8 oz)] 93.26 kg (205 lb 9.6 oz) (01/14 2030)  Weight change:  Filed Weights   07/22/15 1402 07/22/15 2030  Weight: 98.204 kg (216 lb 8 oz) 93.26 kg (205 lb 9.6 oz)    Intake/Output:    Intake/Output Summary (Last 24 hours) at 07/23/15 1046 Last data filed at 07/23/15 0940  Gross per 24 hour  Intake    250 ml  Output      0 ml  Net    250 ml     Physical Exam: General:  no acute distress, laying in the bed   HEENT  anicteric, moist oral mucous membranes   Neck  supple   Pulm/lungs  decreased breath sounds at bases, mild left basilar crackles   CVS/Heart  no rub or gallop   Abdomen:   soft, nontender, nondistended   Extremities:  no peripheral edema   Neurologic:  alert, oriented   Skin:  no acute rashes   Access:  left IJ PermCath, appears out of place        Basic Metabolic Panel:   Recent Labs Lab 07/22/15 1506 07/22/15 1628 07/23/15 0451  NA 135 138 137  K 2.3* 2.4* 2.7*  CL 98* 99* 100*  CO2 24 24  27   GLUCOSE 220* 163* 100*  BUN 38* 39* 42*  CREATININE 5.53* 5.67* 5.83*  CALCIUM 7.7* 7.9* 7.5*  MG  --   --  1.9     CBC:  Recent Labs Lab 07/22/15 1506 07/23/15 0451  WBC 12.9* 10.4  NEUTROABS 10.9*  --   HGB 8.6* 8.9*  HCT 28.7* 29.5*  MCV 82.3 85.9  PLT 304 268      Microbiology:  Recent Results (from the past 720 hour(s))  Surgical pcr screen     Status: None   Collection Time: 06/23/15 11:52 AM  Result Value Ref Range Status   MRSA, PCR NEGATIVE NEGATIVE Final   Staphylococcus aureus NEGATIVE NEGATIVE Final    Comment:        The Xpert SA Assay (FDA approved for NASAL specimens in patients over 14 years of age), is one component of a comprehensive surveillance program.  Test performance has been validated by [facility] for patients greater than or equal to 31 year old. It is not intended to diagnose infection nor to guide or monitor treatment.   MRSA PCR Screening     Status: None   Collection Time: 06/24/15  3:48 PM  Result Value Ref  Range Status   MRSA by PCR NEGATIVE NEGATIVE Final    Comment:        The GeneXpert MRSA Assay (FDA approved for NASAL specimens only), is one component of a comprehensive MRSA colonization surveillance program. It is not intended to diagnose MRSA infection nor to guide or monitor treatment for MRSA infections.   MRSA PCR Screening     Status: Abnormal   Collection Time: 07/23/15  5:24 AM  Result Value Ref Range Status   MRSA by PCR POSITIVE (A) NEGATIVE Final    Comment:        The GeneXpert MRSA Assay (FDA approved for NASAL specimens only), is one component of a comprehensive MRSA colonization surveillance program. It is not intended to diagnose MRSA infection nor to guide or monitor treatment for MRSA infections. CRITICAL RESULT CALLED TO, READ BACK BY AND VERIFIED WITH: ALISHA BABB ON 07/23/15 AT 0735 BY QSD     Coagulation Studies: No results for input(s): LABPROT, INR in the last 72  hours.  Urinalysis: No results for input(s): COLORURINE, LABSPEC, PHURINE, GLUCOSEU, HGBUR, BILIRUBINUR, KETONESUR, PROTEINUR, UROBILINOGEN, NITRITE, LEUKOCYTESUR in the last 72 hours.  Invalid input(s): APPERANCEUR    Imaging: Dg Chest 1 View  07/22/2015  CLINICAL DATA:  Pt with end stage renal disease. Pt to ED via EMS from [facility] c/o dialysis catheter having been pulled out approximately 5-6 inches. Pt is verbally unresponsive except to basic commands. EXAM: CHEST  1 VIEW COMPARISON:  06/24/2015 FINDINGS: The tunneled left IJ hemodialysis catheter has its tips in the proximal right atrium. No pneumothorax. Confluent Opacities in the left mid and lower lung probably combination of effusion and adjacent parenchymal consolidation/ atelectasis. Right lung clear. Heart size difficult to assess due to adjacent opacities. No effusion. Atheromatous aorta. IMPRESSION: 1. Left IJ dialysis catheter projects in expected location. 2. Left lower lung consolidation/atelectasis with effusion. Electronically Signed   By: Corlis Leak M.D.   On: 07/22/2015 14:35     Medications:     . allopurinol  100 mg Oral Daily  . aspirin EC  81 mg Oral Daily  . atorvastatin  20 mg Oral Daily  . clopidogrel  75 mg Oral Daily  . diltiazem  180 mg Oral Daily  . ferrous sulfate  325 mg Oral Daily  . heparin  5,000 Units Subcutaneous 3 times per day  . latanoprost  1 drop Both Eyes QHS  . magnesium sulfate 1 - 4 g bolus IVPB  2 g Intravenous Once  . piperacillin-tazobactam (ZOSYN)  IV  3.375 g Intravenous Q12H  . pregabalin  75 mg Oral Daily  . senna  17.2 mg Oral QHS  . [START ON 07/25/2015] vancomycin  1,000 mg Intravenous Q T,Th,Sa-HD   naLOXone (NARCAN)  injection  Assessment/ Plan:  82 y.o. african [place] male  with right eye blindness, diastolic congestive heart failure, hypertension, gout, glaucoma, peripheral neuropathy, atrial fibrillation, anemia, carotid stenosis status post bilateral  CEA, CVA, peripheral vascular disease  [facility] TTS  1. End Stage Renal Disease: missed dialysis today due to complicated dialysis access. IJ perncath appears to have slided out.  - Will need vascular consult on Monday to arrange for a catheter exchange - keep patient NPO after midnight  2. Anemia of chronic kidney disease: Hemoglobin 8.9 - low dose EPO with HD  3. Secondary Hyperparathyroidism:  - Monitor phosphorus this admission  4. Hypokalemia - low dose kcl replacement - expected to correct  with normal diet   LOS: 1 Javi Bollman 1/15/201710:46 AM

## 2015-07-23 NOTE — Progress Notes (Signed)
Notified Dr. Sheryle Hailiamond of critical K of 2.7 (increased from 2.4) and that patient has no tele orders.

## 2015-07-23 NOTE — Progress Notes (Signed)
Pharmacy Antibiotic Follow-up Note  Sean Delgado. is a 82 y.o. year-old male admitted on 07/22/2015.  The patient is currently on day 2 of Vancomycin and Zosyn for sepsis/pneumonia.  Assessment/Plan: Spoke Dr. Renae GlossWieting regarding BCID results of Staph Aureus mec A gene positive (MRSA).  MD would like to continue current antibiotic therapy with Vancomycin and Zosyn IV as patient may have aspiration pneumonia.    Temp (24hrs), Avg:98.1 F (36.7 C), Min:98 F (36.7 C), Max:98.3 F (36.8 C)   Recent Labs Lab 07/22/15 1506 07/23/15 0451  WBC 12.9* 10.4    Recent Labs Lab 07/22/15 1506 07/22/15 1628 07/23/15 0451  CREATININE 5.53* 5.67* 5.83*   Estimated Creatinine Clearance: 11.6 mL/min (by C-G formula based on Cr of 5.83).    No Known Allergies  Antimicrobials this admission: Vanc 1/14 >> Zosyn 1/14 >>   Levels/dose changes this admission: Vancomycin 2 gm IV once with HD on 1/14 then 1 gm qHD session  Microbiology results: 1/14 BCx: Staph Aureus mecA gene positive  1/14 MRSA PCR: positive  Thank you for allowing pharmacy to be a part of this patient's care.  Zelina Jimerson G PharmD 07/23/2015 1:35 PM

## 2015-07-24 LAB — POTASSIUM: Potassium: 4.1 mmol/L (ref 3.5–5.1)

## 2015-07-24 LAB — MAGNESIUM: MAGNESIUM: 2.4 mg/dL (ref 1.7–2.4)

## 2015-07-24 LAB — PHOSPHORUS: Phosphorus: 7 mg/dL — ABNORMAL HIGH (ref 2.5–4.6)

## 2015-07-24 MED ORDER — SODIUM CHLORIDE 0.9 % IV SOLN
3.0000 g | INTRAVENOUS | Status: DC
  Administered 2015-07-24: 3 g via INTRAVENOUS
  Filled 2015-07-24 (×2): qty 3

## 2015-07-24 MED ORDER — PENTAFLUOROPROP-TETRAFLUOROETH EX AERO: 1.0000 "application " | INHALATION_SPRAY | CUTANEOUS | Status: DC | PRN

## 2015-07-24 MED ORDER — SODIUM CHLORIDE 0.9 % IV SOLN: 100.0000 mL | INTRAVENOUS | Status: DC | PRN

## 2015-07-24 MED ORDER — LIDOCAINE-PRILOCAINE 2.5-2.5 % EX CREA
1.0000 "application " | TOPICAL_CREAM | CUTANEOUS | Status: DC | PRN
  Filled 2015-07-24: qty 5

## 2015-07-24 MED ORDER — HEPARIN SODIUM (PORCINE) 1000 UNIT/ML DIALYSIS
1000.0000 [IU] | INTRAMUSCULAR | Status: DC | PRN
  Filled 2015-07-24: qty 1

## 2015-07-24 MED ORDER — LIDOCAINE HCL (PF) 1 % IJ SOLN
5.0000 mL | INTRAMUSCULAR | Status: DC | PRN
  Filled 2015-07-24: qty 5

## 2015-07-24 MED ORDER — ALTEPLASE 2 MG IJ SOLR
2.0000 mg | Freq: Once | INTRAMUSCULAR | Status: AC | PRN
  Administered 2015-07-29: 4 mg
  Filled 2015-07-24 (×4): qty 2

## 2015-07-24 NOTE — Progress Notes (Signed)
Subjective:  Left IJ PermCath completely dislodged. There was some serous drainage noted from the perm catheter tract.   Objective:  Vital signs in last 24 hours:  Temp:  [97.8 F (36.6 C)-98.8 F (37.1 C)] 98.3 F (36.8 C) (01/16 1355) Pulse Rate:  [83-112] 112 (01/16 1355) Resp:  [16-17] 16 (01/16 1355) BP: (96-110)/(40-53) 107/53 mmHg (01/16 1355) SpO2:  [92 %-93 %] 92 % (01/16 1355)  Weight change:  Filed Weights   07/22/15 1402 07/22/15 2030  Weight: 98.204 kg (216 lb 8 oz) 93.26 kg (205 lb 9.6 oz)    Intake/Output:    Intake/Output Summary (Last 24 hours) at 07/24/15 1458 Last data filed at 07/23/15 2131  Gross per 24 hour  Intake    475 ml  Output      0 ml  Net    475 ml     Physical Exam: General:  no acute distress, laying in the bed   HEENT  anicteric, moist oral mucous membranes   Neck  supple   Pulm/lungs  basilar rales normal effort  CVS/Heart  S1S2 no rubs  Abdomen:   soft, nontender, nondistended   Extremities:  no peripheral edema   Neurologic:  alert, oriented   Skin:  no acute rashes   Access:  left IJ PermCath, completely dislodged       Basic Metabolic Panel:   Recent Labs Lab 07/22/15 1506 07/22/15 1628 07/23/15 0451 07/24/15 0505  NA 135 138 137  --   K 2.3* 2.4* 2.7* 4.1  CL 98* 99* 100*  --   CO2 24 24 27   --   GLUCOSE 220* 163* 100*  --   BUN 38* 39* 42*  --   CREATININE 5.53* 5.67* 5.83*  --   CALCIUM 7.7* 7.9* 7.5*  --   MG  --   --  1.9 2.4     CBC:  Recent Labs Lab 07/22/15 1506 07/23/15 0451  WBC 12.9* 10.4  NEUTROABS 10.9*  --   HGB 8.6* 8.9*  HCT 28.7* 29.5*  MCV 82.3 85.9  PLT 304 268      Microbiology:  Recent Results (from the past 720 hour(s))  MRSA PCR Screening     Status: None   Collection Time: 06/24/15  3:48 PM  Result Value Ref Range Status   MRSA by PCR NEGATIVE NEGATIVE Final    Comment:        The GeneXpert MRSA Assay (FDA approved for NASAL specimens only), is one component  of a comprehensive MRSA colonization surveillance program. It is not intended to diagnose MRSA infection nor to guide or monitor treatment for MRSA infections.   Culture, blood (routine x 2)     Status: None (Preliminary result)   Collection Time: 07/22/15  6:21 PM  Result Value Ref Range Status   Specimen Description BLOOD LEFT HAND  Final   Special Requests BOTTLES DRAWN AEROBIC AND ANAEROBIC  4CC  Final   Culture  Setup Time   Final    GRAM POSITIVE COCCI IN CLUSTERS AEROBIC BOTTLE ONLY CRITICAL RESULT CALLED TO, READ BACK BY AND VERIFIED WITH: CRYSTAL SCARPENA 07/23/15 1330 MLM    Culture   Final    METHICILLIN RESISTANT STAPHYLOCOCCUS AUREUS AEROBIC BOTTLE ONLY SUSCEPTIBILITIES TO FOLLOW    Report Status PENDING  Incomplete  Culture, blood (routine x 2)     Status: None (Preliminary result)   Collection Time: 07/22/15  6:21 PM  Result Value Ref Range Status   Specimen Description  BLOOD LEFT HAND  Final   Special Requests   Final    BOTTLES DRAWN AEROBIC AND ANAEROBIC  AER 2CC ANA 4CC   Culture  Setup Time   Final    GRAM POSITIVE COCCI IN CLUSTERS AEROBIC BOTTLE ONLY CRITICAL RESULT CALLED TO, READ BACK BY AND VERIFIED WITH: CRYSTAL SCARPENA 07/23/15 1400 MLM    Culture   Final    STAPHYLOCOCCUS AUREUS SUSCEPTIBILITIES TO FOLLOW    Report Status PENDING  Incomplete  Blood Culture ID Panel (Reflexed)     Status: Abnormal   Collection Time: 07/22/15  6:21 PM  Result Value Ref Range Status   Enterococcus species NOT DETECTED NOT DETECTED Final   Listeria monocytogenes NOT DETECTED NOT DETECTED Final   Staphylococcus species DETECTED (A) NOT DETECTED Final   Staphylococcus aureus DETECTED (A) NOT DETECTED Final    Comment: CRITICAL RESULT CALLED TO, READ BACK BY AND VERIFIED WITH: CRYSTAL SCARPENA 07/23/15 1330 ABOUT STAPH SPECIES,STAPH AUREUS, AND MecA DETECTED MLM    Streptococcus species NOT DETECTED NOT DETECTED Final   Streptococcus agalactiae NOT DETECTED NOT  DETECTED Final   Streptococcus pneumoniae NOT DETECTED NOT DETECTED Final   Streptococcus pyogenes NOT DETECTED NOT DETECTED Final   Acinetobacter baumannii NOT DETECTED NOT DETECTED Final   Enterobacteriaceae species NOT DETECTED NOT DETECTED Final   Enterobacter cloacae complex NOT DETECTED NOT DETECTED Final   Escherichia coli NOT DETECTED NOT DETECTED Final   Klebsiella oxytoca NOT DETECTED NOT DETECTED Final   Klebsiella pneumoniae NOT DETECTED NOT DETECTED Final   Proteus species NOT DETECTED NOT DETECTED Final   Serratia marcescens NOT DETECTED NOT DETECTED Final   Haemophilus influenzae NOT DETECTED NOT DETECTED Final   Neisseria meningitidis NOT DETECTED NOT DETECTED Final   Pseudomonas aeruginosa NOT DETECTED NOT DETECTED Final   Candida albicans NOT DETECTED NOT DETECTED Final   Candida glabrata NOT DETECTED NOT DETECTED Final   Candida krusei NOT DETECTED NOT DETECTED Final   Candida parapsilosis NOT DETECTED NOT DETECTED Final   Candida tropicalis NOT DETECTED NOT DETECTED Final   Carbapenem resistance NOT DETECTED NOT DETECTED Final   Methicillin resistance DETECTED (A) NOT DETECTED Final   Vancomycin resistance NOT DETECTED NOT DETECTED Final  MRSA PCR Screening     Status: Abnormal   Collection Time: 07/23/15  5:24 AM  Result Value Ref Range Status   MRSA by PCR POSITIVE (A) NEGATIVE Final    Comment:        The GeneXpert MRSA Assay (FDA approved for NASAL specimens only), is one component of a comprehensive MRSA colonization surveillance program. It is not intended to diagnose MRSA infection nor to guide or monitor treatment for MRSA infections. CRITICAL RESULT CALLED TO, READ BACK BY AND VERIFIED WITH: ALISHA BABB ON 07/23/15 AT 0735 BY QSD     Coagulation Studies: No results for input(s): LABPROT, INR in the last 72 hours.  Urinalysis: No results for input(s): COLORURINE, LABSPEC, PHURINE, GLUCOSEU, HGBUR, BILIRUBINUR, KETONESUR, PROTEINUR, UROBILINOGEN,  NITRITE, LEUKOCYTESUR in the last 72 hours.  Invalid input(s): APPERANCEUR    Imaging: Dg Hip Port Unilat With Pelvis 1v Right  07/23/2015  CLINICAL DATA:  Pt is in renal stage failure. Unable to transport downstairs so to be done portable. Best images obtained due to condition. Previous MR right hip on 11-25. Pt is complaining of pain and is unable to move in any way. EXAM: DG HIP (WITH OR WITHOUT PELVIS) 1V PORT RIGHT COMPARISON:  None. FINDINGS: Hips are located. No  evidence of pelvic fracture or sacral fracture. Dedicated view of the RIGHT hip demonstrates no femoral neck fracture. IMPRESSION: No fracture or dislocation. Electronically Signed   By: Genevive Bi M.D.   On: 07/23/2015 14:45     Medications:     . allopurinol  100 mg Oral Daily  . ampicillin-sulbactam (UNASYN) IV  3 g Intravenous Q24H  . aspirin EC  81 mg Oral Daily  . atorvastatin  20 mg Oral Daily  . clopidogrel  75 mg Oral Daily  . diltiazem  180 mg Oral Daily  . ferrous sulfate  325 mg Oral Daily  . heparin  5,000 Units Subcutaneous 3 times per day  . latanoprost  1 drop Both Eyes QHS  . pregabalin  75 mg Oral Daily  . senna  17.2 mg Oral QHS  . [START ON 07/25/2015] vancomycin  1,000 mg Intravenous Q T,Th,Sa-HD   naLOXone (NARCAN)  injection  Assessment/ Plan:  82 y.o. african [place] male  with right eye blindness, diastolic congestive heart failure, hypertension, gout, glaucoma, peripheral neuropathy, atrial fibrillation, anemia, carotid stenosis status post bilateral CEA, CVA, peripheral vascular disease  [facility]/ TTS  1. End Stage Renal Disease: missed dialysis today due to complicated dialysis access. IJ perncath appears to have completely dislodged.  - PermCath completely dislodged.  Vascular has been consulted for placement of temporary dialysis catheter.  Patient will have hemodialysis thereafter.  2. Anemia of chronic kidney disease: Hemoglobin 8.9 -  hemoglobin currently 8.9.  Continue to monitor closely.  3. Secondary Hyperparathyroidism:  - check phosphorus and intact PTH today.  4. Hypokalemia - potassium currently up to 4.1 from 2.7.  Continue to monitor.   LOS: 2 Khira Cudmore 1/16/20172:58 PM

## 2015-07-24 NOTE — Progress Notes (Signed)
Pharmacy Antibiotic Follow-up Note  Sean BoutonHolt Shreeve Jr. is a 82 y.o. year-old male admitted on 07/22/2015.  The patient is currently on day 3 of Vancomycin and day 1 Unasyn (previously received 2 days of Zosyn) for MRSA bacteremia and aspiration pneumonia.  Assessment/Plan: Spoke Dr. Renae GlossWieting regarding BCID results of Staph Aureus mec A gene positive (MRSA).  MD would like to continue current antibiotic therapy with Vancomycin and Zosyn IV as patient may have aspiration pneumonia.    Temp (24hrs), Avg:98.3 F (36.8 C), Min:97.8 F (36.6 C), Max:98.8 F (37.1 C)   Recent Labs Lab 07/22/15 1506 07/23/15 0451  WBC 12.9* 10.4     Recent Labs Lab 07/22/15 1506 07/22/15 1628 07/23/15 0451  CREATININE 5.53* 5.67* 5.83*   Estimated Creatinine Clearance: 11.6 mL/min (by C-G formula based on Cr of 5.83).    No Known Allergies  Antimicrobials this admission: Vanc 1/14 >> Unasyn 1/16 >> Zosyn 1/14 >> 1/16   Levels/dose changes this admission: Vancomycin 2 gm IV once with HD on 1/14 then 1 gm qHD session  Will continue Unasyn   Microbiology results: 1/14 BCx: Staph Aureus mecA gene positive  1/14 MRSA PCR: positive  Thank you for allowing pharmacy to be a part of this patient's care.  Ardeth Repetto L PharmD 07/24/2015 4:09 PM

## 2015-07-24 NOTE — Care Management (Signed)
[facility]  CSW aware of admission

## 2015-07-24 NOTE — Evaluation (Signed)
Clinical/Bedside Swallow Evaluation Patient Details  Name: Sean Delgado. MRN: [id number] Date of Birth: 06-17-1934  Today's Date: 07/24/2015 Time: SLP Start Time (ACUTE ONLY): 0932 SLP Stop Time (ACUTE ONLY): 1004 SLP Time Calculation (min) (ACUTE ONLY): 32 min  Past Medical History:  Past Medical History  Diagnosis Date  . ESRD (end stage renal disease) (HCC)   . Hypertension   . CHF (congestive heart failure) (HCC)   . Gout   . Glaucoma   . Neuropathy (HCC)   . Stroke (HCC)   . Peripheral vascular disease (HCC)   . Coronary artery disease   . A-fib (HCC)   . Dysrhythmia   . Blindness of right eye   . Anemia     iron deficiency   Past Surgical History:  Past Surgical History  Procedure Laterality Date  . Vascular surgery Bilateral     Carotid Endarterectomy  . Eye surgery Left     Cataract Extraction  . Av fistula placement Left 04/06/2015    Procedure: ARTERIOVENOUS (AV) FISTULA CREATION;  Surgeon: Annice Needy, MD;  Location: [facility] ORS;  Service: Vascular;  Laterality: Left;  . Peripheral vascular catheterization N/A 04/12/2015    Procedure: Dialysis/Perma Catheter Insertion;  Surgeon: Annice Needy, MD;  Location: [facility] INV[facility]IVE CV LAB;  Service: Cardiovascular;  Laterality: N/A;  . Peripheral vascular catheterization N/A 05/18/2015    Procedure: Dialysis/Perma Catheter Insertion;  Surgeon: Annice Needy, MD;  Location: [facility] CV LAB;  Service: Cardiovascular;  Laterality: N/A;  . Peripheral vascular catheterization Left 05/29/2015    Procedure: A/V Shuntogram/Fistulagram;  Surgeon: Annice Needy, MD;  Location: [facility] CV LAB;  Service: Cardiovascular;  Laterality: Left;  . Peripheral vascular catheterization N/A 05/29/2015    Procedure: A/V Shunt Intervention;  Surgeon: Annice Needy, MD;  Location: [facility] CV [facility];  Service: Cardiovascular;  Laterality: N/A;  . Peripheral vascular catheterization N/A 06/26/2015    Procedure: Dialysis/Perma Catheter Insertion;   Surgeon: Annice Needy, MD;  Location: [facility] CV [facility];  Service: Cardiovascular;  Laterality: N/A;  . Av fistula placement Right 06/29/2015    Procedure: BRACHIAL CEPHALIC AV FISTULA;  Surgeon: Annice Needy, MD;  Location: [facility] ORS;  Service: Vascular;  Laterality: Right;   HPI:      Assessment / Plan / Recommendation Clinical Impression  pt presents with a mod to severe oral pharyngeal dysphagia charactorized by coughing and throat clear with trials of honey thick liquids. pt with no overt ssx aspiraiton with puree trials. pt has a known hx of dysphagia and has been on honey thick liquids for past 3 mos and admits to not following through with this diet at nursing home in which he resides. ST educated on aspiraiton risk and pna.    Aspiration Risk  Moderate aspiration risk    Diet Recommendation Dysphagia 1 (Puree);Honey-thick liquid   Liquid Administration via: Cup Medication Administration: Crushed with puree Supervision: Patient able to self feed Compensations: Slow rate;Small sips/bites;Follow solids with liquid Postural Changes: Seated upright at 90 degrees    Other  Recommendations Oral Care Recommendations: Oral care BID   Follow up Recommendations       Frequency and Duration min 2x/week  1 week       Prognosis Prognosis for Safe Diet Advancement: Fair Barriers to Reach Goals: Severity of deficits;Time post onset      Swallow Study   General Date of Onset: 07/24/15 Type of Study: Bedside Swallow Evaluation Diet Prior to this  Study: NPO Temperature Spikes Noted: No Respiratory Status: Room air History of Recent Intubation: No Behavior/Cognition: Alert;Cooperative;Pleasant mood Oral Cavity Assessment: Within Functional Limits Oral Care Completed by SLP: No Oral Cavity - Dentition: Adequate natural dentition Vision: Functional for self-feeding Self-Feeding Abilities: Able to feed self Patient Positioning: Upright in bed Baseline Vocal Quality:  Normal Volitional Cough: Strong Volitional Swallow: Able to elicit    Oral/Motor/Sensory Function Overall Oral Motor/Sensory Function: Mild impairment Lingual Strength: Reduced Lingual Sensation: Reduced   Ice Chips Ice chips: Not tested   Thin Liquid Thin Liquid: Not tested    Nectar Thick Nectar Thick Liquid: Not tested   Honey Thick Honey Thick Liquid: Impaired Presentation: Cup Oral Phase Impairments: Reduced labial seal Oral Phase Functional Implications: Prolonged oral transit Pharyngeal Phase Impairments: Throat Clearing - Delayed;Cough - Immediate;Cough - Delayed;Throat Clearing - Immediate   Puree Puree: Within functional limits Presentation: Spoon;Self Fed   Solid   GO   Solid: Not tested        Meredith PelStacie Harris Sauber 07/24/2015,11:33 AM

## 2015-07-24 NOTE — Progress Notes (Signed)
Hemodialysis start 

## 2015-07-24 NOTE — Consult Note (Signed)
[facility] Vascular Consult Note  MRN : [id number]  Sean BoutonHolt Hofbauer Jr. is a 82 y.o. (01-01-1934) male who presents with chief complaint of  Chief Complaint  Patient presents with  . Vascular Access Problem    dialysis catheter pulled out  .  History of Present Illness: Patient is admitted with bacteremia, permcath infection, and lethargy.  We are asked by Dr. Cherylann RatelLateef of Nephrology service and Dr. Renae GlossWieting from the Hospitalist service to see the patient.  He has ESRD and had an AVF placed about 3-4 weeks ago.  Had an attempted AVF in left arm that did not mature.  He has been catheter dependent for his dialysis so far.  His left jugular catheter had drainage and he was admitted with lethargy and fever.  Found to be bacteremic, likely with MRSA.  His catheter has completely come out at this point.  We are asked to secure new dialysis access both now, and for future planning at discharge.  He provides little of the history and is pretty sleepy at this time.    Current Facility-Administered Medications  Medication Dose Route Frequency Provider Last Rate Last Dose  . allopurinol (ZYLOPRIM) tablet 100 mg  100 mg Oral Daily Altamese DillingVaibhavkumar Vachhani, MD   100 mg at 07/23/15 0947  . Ampicillin-Sulbactam (UNASYN) 3 g in sodium chloride 0.9 % 100 mL IVPB  3 g Intravenous Q24H Bertram SavinMichael L Simpson, RPH      . aspirin EC tablet 81 mg  81 mg Oral Daily Altamese DillingVaibhavkumar Vachhani, MD   81 mg at 07/23/15 0947  . atorvastatin (LIPITOR) tablet 20 mg  20 mg Oral Daily Altamese DillingVaibhavkumar Vachhani, MD   20 mg at 07/23/15 0947  . clopidogrel (PLAVIX) tablet 75 mg  75 mg Oral Daily Altamese DillingVaibhavkumar Vachhani, MD   75 mg at 07/23/15 0946  . diltiazem (CARDIZEM CD) 24 hr capsule 180 mg  180 mg Oral Daily Altamese DillingVaibhavkumar Vachhani, MD   Stopped at 07/23/15 1000  . ferrous sulfate tablet 325 mg  325 mg Oral Daily Altamese DillingVaibhavkumar Vachhani, MD   325 mg at 07/23/15 0947  . heparin injection 5,000 Units  5,000 Units Subcutaneous 3  times per day Altamese DillingVaibhavkumar Vachhani, MD   5,000 Units at 07/24/15 0617  . latanoprost (XALATAN) 0.005 % ophthalmic solution 1 drop  1 drop Both Eyes QHS Altamese DillingVaibhavkumar Vachhani, MD   1 drop at 07/23/15 2131  . naloxone [facility](NARCAN) injection 2 mg  2 mg Intravenous PRN Arnaldo NatalPaul F Malinda, MD   2 mg at 07/22/15 1746  . pregabalin (LYRICA) capsule 75 mg  75 mg Oral Daily Altamese DillingVaibhavkumar Vachhani, MD   75 mg at 07/23/15 0947  . senna (SENOKOT) tablet 17.2 mg  17.2 mg Oral QHS Altamese DillingVaibhavkumar Vachhani, MD   17.2 mg at 07/23/15 2130  . [START ON 07/25/2015] vancomycin (VANCOCIN) IVPB 1000 mg/200 mL premix  1,000 mg Intravenous Q T,Th,Sa-HD Cindi CarbonMary M Swayne, [facility] CenterRPH        Past Medical History  Diagnosis Date  . ESRD (end stage renal disease) (HCC)   . Hypertension   . CHF (congestive heart failure) (HCC)   . Gout   . Glaucoma   . Neuropathy (HCC)   . Stroke (HCC)   . Peripheral vascular disease (HCC)   . Coronary artery disease   . A-fib (HCC)   . Dysrhythmia   . Blindness of right eye   . Anemia     iron deficiency    Past Surgical History  Procedure Laterality  Date  . Vascular surgery Bilateral     Carotid Endarterectomy  . Eye surgery Left     Cataract Extraction  . Av fistula placement Left 04/06/2015    Procedure: ARTERIOVENOUS (AV) FISTULA CREATION;  Surgeon: Annice Needy, MD;  Location: [facility] ORS;  Service: Vascular;  Laterality: Left;  . Peripheral vascular catheterization N/A 04/12/2015    Procedure: Dialysis/Perma Catheter Insertion;  Surgeon: Annice Needy, MD;  Location: [facility] INVASIVE CV LAB;  Service: Cardiovascular;  Laterality: N/A;  . Peripheral vascular catheterization N/A 05/18/2015    Procedure: Dialysis/Perma Catheter Insertion;  Surgeon: Annice Needy, MD;  Location: [facility] INVASIVE CV LAB;  Service: Cardiovascular;  Laterality: N/A;  . Peripheral vascular catheterization Left 05/29/2015    Procedure: A/V Shuntogram/Fistulagram;  Surgeon: Annice Needy, MD;  Location: [facility] INVASIVE CV LAB;   Service: Cardiovascular;  Laterality: Left;  . Peripheral vascular catheterization N/A 05/29/2015    Procedure: A/V Shunt Intervention;  Surgeon: Annice Needy, MD;  Location: [facility] INVASIVE CV LAB;  Service: Cardiovascular;  Laterality: N/A;  . Peripheral vascular catheterization N/A 06/26/2015    Procedure: Dialysis/Perma Catheter Insertion;  Surgeon: Annice Needy, MD;  Location: [facility] INVASIVE CV LAB;  Service: Cardiovascular;  Laterality: N/A;  . Av fistula placement Right 06/29/2015    Procedure: BRACHIAL CEPHALIC AV FISTULA;  Surgeon: Annice Needy, MD;  Location: [facility] ORS;  Service: Vascular;  Laterality: Right;    Social History Social History  Substance Use Topics  . Smoking status: Former Smoker -- 0.25 packs/day for 15 years    Types: Cigarettes    Quit date: 09/06/2006  . Smokeless tobacco: Never Used  . Alcohol Use: No    Family History Family History  Problem Relation Age of Onset  . Diabetes Mother   no bleeding or clotting disorders.  No history of aneurysms  No Known Allergies   REVIEW OF SYSTEMS (Negative unless checked)  Constitutional: [] Weight loss  [x] Fever  [x] Chills Cardiac: [] Chest pain   [] Chest pressure   [] Palpitations   [] Shortness of breath when laying flat   [] Shortness of breath at rest   [] Shortness of breath with exertion. Vascular:  [] Pain in legs with walking   [] Pain in legs at rest   [] Pain in legs when laying flat   [] Claudication   [] Pain in feet when walking  [] Pain in feet at rest  [] Pain in feet when laying flat   [] History of DVT   [] Phlebitis   [] Swelling in legs   [] Varicose veins   [] Non-healing ulcers Pulmonary:   [] Uses home oxygen   [] Productive cough   [] Hemoptysis   [] Wheeze  [] COPD   [] Asthma Neurologic:  [] Dizziness  [] Blackouts   [] Seizures   [x] History of stroke   [] History of TIA  [] Aphasia   [] Temporary blindness   [] Dysphagia   [] Weakness or numbness in arms   [] Weakness or numbness in legs Musculoskeletal:  [] Arthritis   [] Joint  swelling   [] Joint pain   [] Low back pain Hematologic:  [] Easy bruising  [] Easy bleeding   [] Hypercoagulable state   [] Anemic  [] Hepatitis Gastrointestinal:  [] Blood in stool   [] Vomiting blood  [] Gastroesophageal reflux/heartburn   [] Difficulty swallowing. Genitourinary:  [x] Chronic kidney disease   [] Difficult urination  [] Frequent urination  [] Burning with urination   [] Blood in urine Skin:  [] Rashes   [] Ulcers   [] Wounds Psychological:  [] History of anxiety   []  History of major depression.  Physical Examination  Filed Vitals:   07/23/15 1327  07/23/15 2220 07/24/15 0621 07/24/15 1355  BP: 91/42 96/40 110/51 107/53  Pulse: 89 83 101 112  Temp: 98.1 F (36.7 C) 97.8 F (36.6 C) 98.8 F (37.1 C) 98.3 F (36.8 C)  TempSrc: Oral Oral Oral Oral  Resp: 17 17 16 16   Height:      Weight:      SpO2: 93% 93% 92% 92%   Body mass index is 27.13 kg/(m^2). Gen:  WD/WN, NAD Head: [place]/AT, No temporalis wasting. Prominent temp pulse not noted. Ear/Nose/Throat: Hearing grossly intact, nares w/o erythema or drainage, oropharynx w/o Erythema/Exudate Eyes: PERRLA, EOMI.  Neck: Supple, no nuchal rigidity.  No JVD.  Pulmonary:  Good air movement, equal bilaterally.  Cardiac: somewhat irregular Vascular:  Vessel Right Left  Radial Palpable Palpable                                   Gastrointestinal: soft, non-tender/non-distended. No guarding/reflex. No masses, surgical incisions, or scars. Musculoskeletal: M/S 5/5 throughout.  Extremities without ischemic changes.  No deformity or atrophy. Mild to moderate bilateral edema. Neurologic: moves all four, but is lethargic.  CN appear intact Psychiatric: patient lethargic.  Follows some commands. Dermatologic: No rashes or ulcers noted.  No cellulitis or open wounds. Lymph : No Cervical, Axillary, or Inguinal lymphadenopathy.      CBC Lab Results  Component Value Date   WBC 10.4 07/23/2015   HGB 8.9* 07/23/2015   HCT 29.5* 07/23/2015    MCV 85.9 07/23/2015   PLT 268 07/23/2015    BMET    Component Value Date/Time   NA 137 07/23/2015 0451   K 4.1 07/24/2015 0505   CL 100* 07/23/2015 0451   CO2 27 07/23/2015 0451   GLUCOSE 100* 07/23/2015 0451   BUN 42* 07/23/2015 0451   CREATININE 5.83* 07/23/2015 0451   CALCIUM 7.5* 07/23/2015 0451   GFRNONAA 8* 07/23/2015 0451   GFRAA 10* 07/23/2015 0451   Estimated Creatinine Clearance: 11.6 mL/min (by C-G formula based on Cr of 5.83).  COAG Lab Results  Component Value Date   INR 1.18 06/29/2015   INR 1.07 06/24/2015   INR 1.06 06/23/2015    Radiology Dg Chest 1 View  07/22/2015  CLINICAL DATA:  Pt with end stage renal disease. Pt to ED via EMS from [facility] c/o dialysis catheter having been pulled out approximately 5-6 inches. Pt is verbally unresponsive except to basic commands. EXAM: CHEST  1 VIEW COMPARISON:  06/24/2015 FINDINGS: The tunneled left IJ hemodialysis catheter has its tips in the proximal right atrium. No pneumothorax. Confluent Opacities in the left mid and lower lung probably combination of effusion and adjacent parenchymal consolidation/ atelectasis. Right lung clear. Heart size difficult to assess due to adjacent opacities. No effusion. Atheromatous aorta. IMPRESSION: 1. Left IJ dialysis catheter projects in expected location. 2. Left lower lung consolidation/atelectasis with effusion. Electronically Signed   By: Corlis Leak M.D.   On: 07/22/2015 14:35   Dg Hip Port Unilat With Pelvis 1v Right  07/23/2015  CLINICAL DATA:  Pt is in renal stage failure. Unable to transport downstairs so to be done portable. Best images obtained due to condition. Previous MR right hip on 11-25. Pt is complaining of pain and is unable to move in any way. EXAM: DG HIP (WITH OR WITHOUT PELVIS) 1V PORT RIGHT COMPARISON:  None. FINDINGS: Hips are located. No evidence of pelvic fracture or sacral fracture. Dedicated  view of the RIGHT hip demonstrates no femoral neck  fracture. IMPRESSION: No fracture or dislocation. Electronically Signed   By: Genevive Bi M.D.   On: 07/23/2015 14:45     Assessment/Plan 1. Bacteremia. Permcath was the likely source and this is now out. Will need to be infection free before new permcath is placed. Continue IV ABX 2. ESRD. Missed dialysis today due to dislodged permcath.  Will place temp cath today for use during his hospitalizations.  AVF not useable at this point.  Will need Permcath prior to discharge once infection resolved 3. CAS. S/p bilateral stents many years ago.  General cognitive decline recently.  Recheck as outpatient  4. Complication of dialysis access.  Permcath infection.  It is now out.     Beyonka Pitney, MD  07/24/2015 3:16 PM

## 2015-07-24 NOTE — Care Management Important Message (Signed)
Important Message  Patient Details  Name: Sean BoutonHolt Carlton Jr. MRN: [id number] Date of Birth: 01-Jan-1934   Medicare Important Message Given:  Yes    Chapman FitchBOWEN, Tawonda Legaspi T, RN 07/24/2015, 3:56 PM

## 2015-07-24 NOTE — Op Note (Signed)
  OPERATIVE NOTE   PROCEDURE: 1. Ultrasound guidance for vascular access right femoral vein 2. Placement of a 30 cm Trialysis type dialysis catheter, right femoral vein  PRE-OPERATIVE DIAGNOSIS: 1. ESRD 2. Bacteremia and permcath infection with AVF not mature for use  POST-OPERATIVE DIAGNOSIS: Same  SURGEON: Festus BarrenJason Raciel Caffrey, MD  ASSISTANT(S): None  ANESTHESIA: local  ESTIMATED BLOOD LOSS: Minimal   FINDING(S): 1. None  SPECIMEN(S): None  INDICATIONS:  Patient is a 82 yo male who presents with bacteremia and a Permcath infection.  Permcath is out, and he needs a temporary catheter for dialysis while his infection clears. This was done urgently as the patient could not consent, and the Nephrologist needed dialysis access immediately, so he and I agreed this was medically necessary.  DESCRIPTION: After obtaining full informed written consent, the patient was laid flat in the bed. The right groin was sterilely prepped and draped in a sterile surgical field was created. The right femoral vein was visualized with ultrasound and found to be widely patent. It was then accessed under direct guidance without difficulty with a Seldinger needle and a permanent image was recorded. A J-wire was then placed. After skin nick and dilatation, a 30 cm triple lumen dialysis catheter was placed over the wire and the wire was removed. The lumens withdrew dark red nonpulsatile blood and flushed easily with sterile saline. The catheter was secured to the skin with 3 nylon sutures. Sterile dressing was placed.  COMPLICATIONS: None  CONDITION: Stable  Aloma Boch 07/24/2015 4:02 PM

## 2015-07-24 NOTE — Progress Notes (Signed)
Hemodialysis completed. 

## 2015-07-24 NOTE — Progress Notes (Signed)
Patient ID: Sean Fuston., male   DOB: 04-Jun-1934, 82 y.o.   MRN: [id number] [facility] PROGRESS NOTE  PCP: Bobbye Morton, MD  HPI/Subjective: Patient complaining of right hip pain. Offers complaints of some cough. States he has no problems swallowing. No shortness of breath.  Objective: Filed Vitals:   07/24/15 0621 07/24/15 1355  BP: 110/51 107/53  Pulse: 101 112  Temp: 98.8 F (37.1 C) 98.3 F (36.8 C)  Resp: 16 16    Filed Weights   07/22/15 1402 07/22/15 2030  Weight: 98.204 kg (216 lb 8 oz) 93.26 kg (205 lb 9.6 oz)    ROS: Review of Systems  Constitutional: Negative for fever and chills.  Eyes: Negative for blurred vision.  Respiratory: Positive for cough. Negative for shortness of breath.   Cardiovascular: Negative for chest pain.  Gastrointestinal: Negative for nausea, vomiting, abdominal pain, diarrhea and constipation.  Genitourinary: Negative for dysuria.  Musculoskeletal: Positive for joint pain.  Neurological: Negative for dizziness and headaches.   Exam: Physical Exam  HENT:  Nose: No mucosal edema.  Mouth/Throat: No oropharyngeal exudate or posterior oropharyngeal edema.  Eyes: Conjunctivae, EOM and lids are normal. Pupils are equal, round, and reactive to light.  Neck: No JVD present. Carotid bruit is not present. No edema present. No thyroid mass and no thyromegaly present.  Cardiovascular: S1 normal and S2 normal.  Exam reveals no gallop.   Murmur heard.  Systolic murmur is present with a grade of 2/6  Pulses:      Dorsalis pedis pulses are 2+ on the right side, and 2+ on the left side.  Respiratory: No respiratory distress. He has no wheezes. He has rhonchi in the right middle field, the right lower field, the left middle field and the left lower field. He has no rales.  GI: Soft. Bowel sounds are normal. There is no tenderness.  Musculoskeletal:       Right ankle: He exhibits swelling.       Left ankle: He exhibits swelling.   Pain when flexing the right hip.  Lymphadenopathy:    He has no cervical adenopathy.  Neurological: He is alert.  Skin: Skin is warm. Nails show no clubbing.  Stage I decubitus as per nurse on the right buttock. Dry skin on the lower extremities.  Psychiatric: He has a normal mood and affect.      Data Reviewed: Basic Metabolic Panel:  Recent Labs Lab 07/22/15 1506 07/22/15 1628 07/23/15 0451 07/24/15 0505  NA 135 138 137  --   K 2.3* 2.4* 2.7* 4.1  CL 98* 99* 100*  --   CO2 24 24 27   --   GLUCOSE 220* 163* 100*  --   BUN 38* 39* 42*  --   CREATININE 5.53* 5.67* 5.83*  --   CALCIUM 7.7* 7.9* 7.5*  --   MG  --   --  1.9 2.4  CBC:  Recent Labs Lab 07/22/15 1506 07/23/15 0451  WBC 12.9* 10.4  NEUTROABS 10.9*  --   HGB 8.6* 8.9*  HCT 28.7* 29.5*  MCV 82.3 85.9  PLT 304 268    Recent Results (from the past 240 hour(s))  Culture, blood (routine x 2)     Status: None (Preliminary result)   Collection Time: 07/22/15  6:21 PM  Result Value Ref Range Status   Specimen Description BLOOD LEFT HAND  Final   Special Requests BOTTLES DRAWN AEROBIC AND ANAEROBIC  4CC  Final   Culture  Setup  Time   Final    GRAM POSITIVE COCCI IN CLUSTERS AEROBIC BOTTLE ONLY CRITICAL RESULT CALLED TO, READ BACK BY AND VERIFIED WITH: CRYSTAL SCARPENA 07/23/15 1330 MLM    Culture   Final    METHICILLIN RESISTANT STAPHYLOCOCCUS AUREUS AEROBIC BOTTLE ONLY SUSCEPTIBILITIES TO FOLLOW    Report Status PENDING  Incomplete  Culture, blood (routine x 2)     Status: None (Preliminary result)   Collection Time: 07/22/15  6:21 PM  Result Value Ref Range Status   Specimen Description BLOOD LEFT HAND  Final   Special Requests   Final    BOTTLES DRAWN AEROBIC AND ANAEROBIC  AER 2CC ANA 4CC   Culture  Setup Time   Final    GRAM POSITIVE COCCI IN CLUSTERS AEROBIC BOTTLE ONLY CRITICAL RESULT CALLED TO, READ BACK BY AND VERIFIED WITH: CRYSTAL SCARPENA 07/23/15 1400 MLM    Culture   Final     STAPHYLOCOCCUS AUREUS SUSCEPTIBILITIES TO FOLLOW    Report Status PENDING  Incomplete  Blood Culture ID Panel (Reflexed)     Status: Abnormal   Collection Time: 07/22/15  6:21 PM  Result Value Ref Range Status   Enterococcus species NOT DETECTED NOT DETECTED Final   Listeria monocytogenes NOT DETECTED NOT DETECTED Final   Staphylococcus species DETECTED (A) NOT DETECTED Final   Staphylococcus aureus DETECTED (A) NOT DETECTED Final    Comment: CRITICAL RESULT CALLED TO, READ BACK BY AND VERIFIED WITH: CRYSTAL SCARPENA 07/23/15 1330 ABOUT STAPH SPECIES,STAPH AUREUS, AND MecA DETECTED MLM    Streptococcus species NOT DETECTED NOT DETECTED Final   Streptococcus agalactiae NOT DETECTED NOT DETECTED Final   Streptococcus pneumoniae NOT DETECTED NOT DETECTED Final   Streptococcus pyogenes NOT DETECTED NOT DETECTED Final   Acinetobacter baumannii NOT DETECTED NOT DETECTED Final   Enterobacteriaceae species NOT DETECTED NOT DETECTED Final   Enterobacter cloacae complex NOT DETECTED NOT DETECTED Final   Escherichia coli NOT DETECTED NOT DETECTED Final   Klebsiella oxytoca NOT DETECTED NOT DETECTED Final   Klebsiella pneumoniae NOT DETECTED NOT DETECTED Final   Proteus species NOT DETECTED NOT DETECTED Final   Serratia marcescens NOT DETECTED NOT DETECTED Final   Haemophilus influenzae NOT DETECTED NOT DETECTED Final   Neisseria meningitidis NOT DETECTED NOT DETECTED Final   Pseudomonas aeruginosa NOT DETECTED NOT DETECTED Final   Candida albicans NOT DETECTED NOT DETECTED Final   Candida glabrata NOT DETECTED NOT DETECTED Final   Candida krusei NOT DETECTED NOT DETECTED Final   Candida parapsilosis NOT DETECTED NOT DETECTED Final   Candida tropicalis NOT DETECTED NOT DETECTED Final   Carbapenem resistance NOT DETECTED NOT DETECTED Final   Methicillin resistance DETECTED (A) NOT DETECTED Final   Vancomycin resistance NOT DETECTED NOT DETECTED Final  MRSA PCR Screening     Status: Abnormal    Collection Time: 07/23/15  5:24 AM  Result Value Ref Range Status   MRSA by PCR POSITIVE (A) NEGATIVE Final    Comment:        The GeneXpert MRSA Assay (FDA approved for NASAL specimens only), is one component of a comprehensive MRSA colonization surveillance program. It is not intended to diagnose MRSA infection nor to guide or monitor treatment for MRSA infections. CRITICAL RESULT CALLED TO, READ BACK BY AND VERIFIED WITH: ALISHA BABB ON 07/23/15 AT 0735 BY QSD      Studies: Dg Hip Port Unilat With Pelvis 1v Right  07/23/2015  CLINICAL DATA:  Pt is in renal stage failure. Unable to transport  downstairs so to be done portable. Best images obtained due to condition. Previous MR right hip on 11-25. Pt is complaining of pain and is unable to move in any way. EXAM: DG HIP (WITH OR WITHOUT PELVIS) 1V PORT RIGHT COMPARISON:  None. FINDINGS: Hips are located. No evidence of pelvic fracture or sacral fracture. Dedicated view of the RIGHT hip demonstrates no femoral neck fracture. IMPRESSION: No fracture or dislocation. Electronically Signed   By: Genevive BiStewart  Edmunds M.D.   On: 07/23/2015 14:45    Scheduled Meds: . allopurinol  100 mg Oral Daily  . ampicillin-sulbactam (UNASYN) IV  3 g Intravenous Q24H  . aspirin EC  81 mg Oral Daily  . atorvastatin  20 mg Oral Daily  . clopidogrel  75 mg Oral Daily  . diltiazem  180 mg Oral Daily  . ferrous sulfate  325 mg Oral Daily  . heparin  5,000 Units Subcutaneous 3 times per day  . latanoprost  1 drop Both Eyes QHS  . pregabalin  75 mg Oral Daily  . senna  17.2 mg Oral QHS  . [START ON 07/25/2015] vancomycin  1,000 mg Intravenous Q T,Th,Sa-HD    Assessment/Plan:  1. Staph aureus sepsis, aspiration pneumonia. Hypotension, tachypnea, hypoxia and leukocytosis. Patient placed on IV vancomycin and Unasyn. Likely source of fever is dialysis catheter left chest that was removed. Repeat blood cultures tomorrow. 2. Hypokalemia and hypomagnesemia  replaced 3. End-stage renal disease on dialysis Tuesday Thursday and Saturday. Permacath removed today. Temporary catheter until sepsis clears. We will need permacath again. 4. Hypotension hold his blood pressure medications 5. Peripheral vascular disease on aspirin and Plavix 6. Hyperlipidemia unspecified on atorvastatin 7. History of gout on allopurinol 8. Glaucoma unspecified continue latanoprost 9. Anemia of chronic disease Procrit with dialysis and continue iron supplementation  Code Status:     Code Status Orders        Start     Ordered   07/22/15 2038  Full code   Continuous     07/22/15 2038    Code Status History    Date Active Date Inactive Code Status Order ID Comments User Context   06/24/2015  4:07 PM 06/27/2015 12:37 AM Full Code [id number]  Auburn BilberryShreyang Patel, MD Inpatient   05/30/2015  5:22 PM 06/05/2015  6:42 PM Full Code [id number]  Trudee KusterBrandi R Mansfield, RN Inpatient   05/30/2015  1:59 PM 05/30/2015  5:22 PM DNR [id number]  Shaune PollackQing Chen, MD Inpatient   05/29/2015  3:57 PM 05/29/2015  7:32 PM Full Code [id number]  Annice NeedyJason S Dew, MD Inpatient   04/08/2015  8:31 PM 04/17/2015 11:42 PM Full Code [id number]  Gracelyn NurseJohn D Johnston, MD Inpatient   03/04/2015  4:13 PM 03/11/2015  7:00 PM DNR [id number]  Houston SirenVivek J Sainani, MD Inpatient     Family Communication: Spoke with daughter on phone [facility]  Consultants:  Nephrology  Vascular surgery  Antibiotics:  Vancomycin  Unasyn  Time spent: 24 minutes  Alford HighlandWIETING, Kataya Guimont  [facility]

## 2015-07-24 NOTE — Clinical Social Work Note (Signed)
[facility] and that [facility] can return when time for discharge. CSW full assessment to follow.  York SpanielMonica Zeriyah Wain MSW,LCSW [phone]

## 2015-07-25 ENCOUNTER — Inpatient Hospital Stay
Admit: 2015-07-25 | Discharge: 2015-07-25 | Disposition: A | Discharge status: Home / Self Care | Payer: Medicare (Managed Care) | Attending: Infectious Diseases | Admitting: Infectious Diseases

## 2015-07-25 DIAGNOSIS — E440 Moderate protein-calorie malnutrition: Secondary | ICD-10-CM | POA: Insufficient documentation

## 2015-07-25 LAB — HEPATITIS B SURFACE ANTIGEN: Hepatitis B Surface Ag: NEGATIVE

## 2015-07-25 LAB — PARATHYROID HORMONE, INTACT (NO CA): PTH: 124 pg/mL — AB (ref 15–65)

## 2015-07-25 MED ORDER — EPOETIN ALFA 10000 UNIT/ML IJ SOLN
10000.0000 [IU] | INTRAMUSCULAR | Status: DC
  Administered 2015-07-26 – 2015-08-03 (×5): 10000 [IU] via INTRAVENOUS
  Filled 2015-07-25 (×3): qty 1

## 2015-07-25 NOTE — Consult Note (Signed)
[facility] Infectious Disease     Reason for Consult: Bacteremia    Referring Physician: Estanislado Spire Date of Admission:  07/22/2015   Active Problems:   Acute respiratory failure (HCC)   Sepsis (HCC)   Aspiration pneumonia (HCC)   Malnutrition of moderate degree  HPI: Sean Nguyen. is a 82 y.o. male history of end-stage renal disease on hemodialysis, hypertension, CHF, glaucoma, neuropathy, stroke, peripheral vascular disease, coronary artery disease, A. fib, right eye blindness- lives in a nursing home admitted with lethargy, leukocytosis, hypoxia and dislocation of his HD cath.  BCX + 2/2 MRSA.  HD cath removed 1/16.  FU bcx 1/17 NGTD He denies any fevers, chills, ns, cough.  Past Medical History  Diagnosis Date  . ESRD (end stage renal disease) ([place])   . Hypertension   . CHF (congestive heart failure) ([place])   . Gout   . Glaucoma   . Neuropathy ([place])   . Stroke ([place])   . Peripheral vascular disease ([place])   . Coronary artery disease   . A-fib ([place])   . Dysrhythmia   . Blindness of right eye   . Anemia     iron deficiency   Past Surgical History  Procedure Laterality Date  . Vascular surgery Bilateral     Carotid Endarterectomy  . Eye surgery Left     Cataract Extraction  . Av fistula placement Left 04/06/2015    Procedure: ARTERIOVENOUS (AV) FISTULA CREATION;  Surgeon: Algernon Huxley, MD;  Location: [facility];  Service: Vascular;  Laterality: Left;  . Peripheral vascular catheterization N/A 04/12/2015    Procedure: Dialysis/Perma Catheter Insertion;  Surgeon: Algernon Huxley, MD;  Location: [place] [facility];  Service: Cardiovascular;  Laterality: N/A;  . Peripheral vascular catheterization N/A 05/18/2015    Procedure: Dialysis/Perma Catheter Insertion;  Surgeon: Algernon Huxley, MD;  Location: [place] [facility];  Service: Cardiovascular;  Laterality: N/A;  . Peripheral vascular catheterization Left 05/29/2015    Procedure: A/V Shuntogram/Fistulagram;  Surgeon: Algernon Huxley, MD;   Location: [place] [facility];  Service: Cardiovascular;  Laterality: Left;  . Peripheral vascular catheterization N/A 05/29/2015    Procedure: A/V Shunt Intervention;  Surgeon: Algernon Huxley, MD;  Location: [place] [facility];  Service: Cardiovascular;  Laterality: N/A;  . Peripheral vascular catheterization N/A 06/26/2015    Procedure: Dialysis/Perma Catheter Insertion;  Surgeon: Algernon Huxley, MD;  Location: [place] [facility];  Service: Cardiovascular;  Laterality: N/A;  . Av fistula placement Right 06/29/2015    Procedure: BRACHIAL CEPHALIC AV FISTULA;  Surgeon: Algernon Huxley, MD;  Location: [facility];  Service: Vascular;  Laterality: Right;   Social History  Substance Use Topics  . Smoking status: Former Smoker -- 0.25 packs/day for 15 years    Types: Cigarettes    Quit date: 09/06/2006  . Smokeless tobacco: Never Used  . Alcohol Use: No   Family History  Problem Relation Age of Onset  . Diabetes Mother     Allergies: No Known Allergies  Current antibiotics: Antibiotics Given (last 72 hours)    Date/Time Action Medication Dose Rate   07/22/15 2150 Given   vancomycin (VANCOCIN) 2,000 mg in sodium chloride 0.9 % 500 mL IVPB 2,000 mg 250 mL/hr   07/22/15 2150 Given   piperacillin-tazobactam (ZOSYN) IVPB 3.375 g 3.375 g 12.5 mL/hr   07/23/15 1336 Given   piperacillin-tazobactam (ZOSYN) IVPB 3.375 g 3.375 g 12.5 mL/hr   07/23/15 2131 Given   piperacillin-tazobactam (  ZOSYN) IVPB 3.375 g 3.375 g 12.5 mL/hr   07/24/15 1000 Given   piperacillin-tazobactam (ZOSYN) IVPB 3.375 g 3.375 g 12.5 mL/hr   07/24/15 2136 Given   Ampicillin-Sulbactam (UNASYN) 3 g in sodium chloride 0.9 % 100 mL IVPB 3 g 100 mL/hr      MEDICATIONS: . allopurinol  100 mg Oral Daily  . ampicillin-sulbactam (UNASYN) IV  3 g Intravenous Q24H  . aspirin EC  81 mg Oral Daily  . atorvastatin  20 mg Oral Daily  . clopidogrel  75 mg Oral Daily  . diltiazem  180 mg Oral Daily  . epoetin (EPOGEN/PROCRIT)  injection  10,000 Units Intravenous Q T,Th,Sa-HD  . ferrous sulfate  325 mg Oral Daily  . heparin  5,000 Units Subcutaneous 3 times per day  . latanoprost  1 drop Both Eyes QHS  . pregabalin  75 mg Oral Daily  . senna  17.2 mg Oral QHS  . vancomycin  1,000 mg Intravenous Q T,Th,Sa-HD    Review of Systems - 11 systems reviewed and negative per HPI   OBJECTIVE: Temp:  [97.7 F (36.5 C)-98.5 F (36.9 C)] 98.2 F (36.8 C) (01/17 0981) Pulse Rate:  [62-131] 111 (01/17 0638) Resp:  [16-24] 24 (01/17 0638) BP: (88-125)/(48-74) 98/48 mmHg (01/17 0638) SpO2:  [92 %-100 %] 95 % (01/17 1914) Weight:  [92.1 kg (203 lb 0.7 oz)-94.4 kg (208 lb 1.8 oz)] 92.1 kg (203 lb 0.7 oz) (01/17 7829) Physical Exam  Constitutional: He is oriented to person, place, and time. Appears chronically ill HENT: anicteric Mouth/Throat: Oropharynx is clear and dry. No oropharyngeal exudate.  Cardiovascular: Normal rate, regular rhythm and normal heart sounds. Distant HD Pulmonary/Chest: Effort normal and breath sounds normal. No respiratory distress. He has no wheezes.  Abdominal: Soft. Bowel sounds are normal. He exhibits no distension. There is no tenderness.  Lymphadenopathy: He has no cervical adenopathy.  Neurological: He is alert and oriented to person, place, and time.  Skin: dry scaly skin  Psychiatric: He has a normal mood and affect. His behavior is normal.  L chest wall HD cath site covered- nontender  LABS: Results for orders placed or performed during the hospital encounter of 07/22/15 (from the past 48 hour(s))  Potassium     Status: None   Collection Time: 07/24/15  5:05 AM  Result Value Ref Range   Potassium 4.1 3.5 - 5.1 mmol/L  Magnesium     Status: None   Collection Time: 07/24/15  5:05 AM  Result Value Ref Range   Magnesium 2.4 1.7 - 2.4 mg/dL  Hepatitis B surface antigen     Status: None   Collection Time: 07/24/15 12:42 PM  Result Value Ref Range   Hepatitis B Surface Ag Negative  Negative    Comment: (NOTE) Performed At: [facility] [place], [place] [id number] Lindon Romp MD HQ:[id number]   Phosphorus     Status: Abnormal   Collection Time: 07/24/15  5:12 PM  Result Value Ref Range   Phosphorus 7.0 (H) 2.5 - 4.6 mg/dL  Parathyroid hormone, intact (no Ca)     Status: Abnormal   Collection Time: 07/24/15  5:12 PM  Result Value Ref Range   PTH 124 (H) 15 - 65 pg/mL    Comment: (NOTE) Performed At: [facility] [address] [place], [place] [id number] Lindon Romp MD VO:[id number]    No components found for: ESR, C REACTIVE PROTEIN MICRO: Recent Results (from the past 720 hour(s))  Culture,  blood (routine x 2)     Status: None (Preliminary result)   Collection Time: 07/22/15  6:21 PM  Result Value Ref Range Status   Specimen Description BLOOD LEFT HAND  Final   Special Requests BOTTLES DRAWN AEROBIC AND ANAEROBIC  4CC  Final   Culture  Setup Time   Final    GRAM POSITIVE COCCI IN CLUSTERS AEROBIC BOTTLE ONLY CRITICAL RESULT CALLED TO, READ BACK BY AND VERIFIED WITH: CRYSTAL [place] 07/23/15 1330 MLM    Culture   Final    METHICILLIN RESISTANT STAPHYLOCOCCUS AUREUS AEROBIC BOTTLE ONLY    Report Status PENDING  Incomplete   Organism ID, Bacteria METHICILLIN RESISTANT STAPHYLOCOCCUS AUREUS  Final      Susceptibility   Methicillin resistant staphylococcus aureus - MIC*    CIPROFLOXACIN >=8 RESISTANT Resistant     ERYTHROMYCIN <=0.25 SENSITIVE Sensitive     GENTAMICIN <=0.5 SENSITIVE Sensitive     OXACILLIN >=4 RESISTANT Resistant     VANCOMYCIN 1 SENSITIVE Sensitive     TRIMETH/SULFA <=10 SENSITIVE Sensitive     CLINDAMYCIN <=0.25 SENSITIVE Sensitive     CEFOXITIN SCREEN Value in next row Resistant      POSITIVECEFOXITIN SCREEN - This test may be used to predict mecA-mediated oxacillin resistance, and it is based on the cefoxitin disk screen test.  The cefoxitin screen and oxacillin work in combination to  determine the final interpretation reported for oxacillin.     Inducible Clindamycin Value in next row Sensitive      POSITIVECEFOXITIN SCREEN - This test may be used to predict mecA-mediated oxacillin resistance, and it is based on the cefoxitin disk screen test.  The cefoxitin screen and oxacillin work in combination to determine the final interpretation reported for oxacillin.     TETRACYCLINE Value in next row Sensitive      SENSITIVE<=1    * METHICILLIN RESISTANT STAPHYLOCOCCUS AUREUS  Culture, blood (routine x 2)     Status: None (Preliminary result)   Collection Time: 07/22/15  6:21 PM  Result Value Ref Range Status   Specimen Description BLOOD LEFT HAND  Final   Special Requests   Final    BOTTLES DRAWN AEROBIC AND ANAEROBIC  AER 2CC ANA 4CC   Culture  Setup Time   Final    GRAM POSITIVE COCCI IN CLUSTERS IN BOTH AEROBIC AND ANAEROBIC BOTTLES CRITICAL RESULT CALLED TO, READ BACK BY AND VERIFIED WITH: CRYSTAL [place] 07/23/15 1400 MLM    Culture METHICILLIN RESISTANT STAPHYLOCOCCUS AUREUS  Final   Report Status PENDING  Incomplete   Organism ID, Bacteria METHICILLIN RESISTANT STAPHYLOCOCCUS AUREUS  Final      Susceptibility   Methicillin resistant staphylococcus aureus - MIC*    CIPROFLOXACIN >=8 RESISTANT Resistant     ERYTHROMYCIN 0.5 SENSITIVE Sensitive     GENTAMICIN <=0.5 SENSITIVE Sensitive     OXACILLIN >=4 RESISTANT Resistant     VANCOMYCIN 1 SENSITIVE Sensitive     TRIMETH/SULFA <=10 SENSITIVE Sensitive     CLINDAMYCIN <=0.25 SENSITIVE Sensitive     CEFOXITIN SCREEN Value in next row Resistant      POSITIVECEFOXITIN SCREEN - This test may be used to predict mecA-mediated oxacillin resistance, and it is based on the cefoxitin disk screen test.  The cefoxitin screen and oxacillin work in combination to determine the final interpretation reported for oxacillin.     Inducible Clindamycin Value in next row Sensitive      POSITIVECEFOXITIN SCREEN - This test may be used to  predict  mecA-mediated oxacillin resistance, and it is based on the cefoxitin disk screen test.  The cefoxitin screen and oxacillin work in combination to determine the final interpretation reported for oxacillin.     TETRACYCLINE Value in next row Sensitive      SENSITIVE<=1    * METHICILLIN RESISTANT STAPHYLOCOCCUS AUREUS  Blood Culture ID Panel (Reflexed)     Status: Abnormal   Collection Time: 07/22/15  6:21 PM  Result Value Ref Range Status   Enterococcus species NOT DETECTED NOT DETECTED Final   Listeria monocytogenes NOT DETECTED NOT DETECTED Final   Staphylococcus species DETECTED (A) NOT DETECTED Final   Staphylococcus aureus DETECTED (A) NOT DETECTED Final    Comment: CRITICAL RESULT CALLED TO, READ BACK BY AND VERIFIED WITH: CRYSTAL [place] 07/23/15 1330 ABOUT STAPH SPECIES,STAPH AUREUS, AND MecA DETECTED MLM    Streptococcus species NOT DETECTED NOT DETECTED Final   Streptococcus agalactiae NOT DETECTED NOT DETECTED Final   Streptococcus pneumoniae NOT DETECTED NOT DETECTED Final   Streptococcus pyogenes NOT DETECTED NOT DETECTED Final   Acinetobacter baumannii NOT DETECTED NOT DETECTED Final   Enterobacteriaceae species NOT DETECTED NOT DETECTED Final   Enterobacter cloacae complex NOT DETECTED NOT DETECTED Final   Escherichia coli NOT DETECTED NOT DETECTED Final   Klebsiella oxytoca NOT DETECTED NOT DETECTED Final   Klebsiella pneumoniae NOT DETECTED NOT DETECTED Final   Proteus species NOT DETECTED NOT DETECTED Final   Serratia marcescens NOT DETECTED NOT DETECTED Final   Haemophilus influenzae NOT DETECTED NOT DETECTED Final   Neisseria meningitidis NOT DETECTED NOT DETECTED Final   Pseudomonas aeruginosa NOT DETECTED NOT DETECTED Final   Candida albicans NOT DETECTED NOT DETECTED Final   Candida glabrata NOT DETECTED NOT DETECTED Final   Candida krusei NOT DETECTED NOT DETECTED Final   Candida parapsilosis NOT DETECTED NOT DETECTED Final   Candida tropicalis NOT  DETECTED NOT DETECTED Final   Carbapenem resistance NOT DETECTED NOT DETECTED Final   Methicillin resistance DETECTED (A) NOT DETECTED Final   Vancomycin resistance NOT DETECTED NOT DETECTED Final  MRSA PCR Screening     Status: Abnormal   Collection Time: 07/23/15  5:24 AM  Result Value Ref Range Status   MRSA by PCR POSITIVE (A) NEGATIVE Final    Comment:        The GeneXpert MRSA Assay (FDA approved for NASAL specimens only), is one component of a comprehensive MRSA colonization surveillance program. It is not intended to diagnose MRSA infection nor to guide or monitor treatment for MRSA infections. CRITICAL RESULT CALLED TO, READ BACK BY AND VERIFIED WITH: ALISHA BABB ON 07/23/15 AT 0735 BY QSD     IMAGING: Dg Chest 1 View  07/22/2015  CLINICAL DATA:  Pt with end stage renal disease. Pt to ED via EMS [facility] c/o dialysis catheter having been pulled out approximately 5-6 inches. Pt is verbally unresponsive except to basic commands. EXAM: CHEST  1 VIEW COMPARISON:  06/24/2015 FINDINGS: The tunneled left IJ hemodialysis catheter has its tips in the proximal right atrium. No pneumothorax. Confluent Opacities in the left mid and lower lung probably combination of effusion and adjacent parenchymal consolidation/ atelectasis. Right lung clear. Heart size difficult to assess due to adjacent opacities. No effusion. Atheromatous aorta. IMPRESSION: 1. Left IJ dialysis catheter projects in expected location. 2. Left lower lung consolidation/atelectasis with effusion. Electronically Signed   By: Lucrezia Europe M.D.   On: 07/22/2015 14:35   Dg Hip Port Unilat With Pelvis 1v Right  07/23/2015  CLINICAL DATA:  Pt is in renal stage failure. Unable to transport downstairs so to be done portable. Best images obtained due to condition. Previous MR right hip on 11-25. Pt is complaining of pain and is unable to move in any way. EXAM: DG HIP (WITH OR WITHOUT PELVIS) 1V PORT RIGHT  COMPARISON:  None. FINDINGS: Hips are located. No evidence of pelvic fracture or sacral fracture. Dedicated view of the RIGHT hip demonstrates no femoral neck fracture. IMPRESSION: No fracture or dislocation. Electronically Signed   By: Suzy Bouchard M.D.   On: 07/23/2015 14:45    Assessment:   Sean Delgado. is a 82 y.o. male with ESRD on HD, admitted with lethargy and found to have MRSA bacteremia likely related to his neck line which had backed out some.  He has LLL consolidation. Atelectasis but per my discussion with him had no cough, sob.   Recommendations Cont vanco Stop unasyn Check Echo Repeat bcx pending Will need min 2 weeks IV vanco - can be given at HD  Thank you very much for allowing me to participate in the care of this patient. Please call with questions.   Cheral Marker. Ola Spurr, MD

## 2015-07-25 NOTE — Progress Notes (Signed)
Patient ID: Sean Liddy., male   DOB: 1934/06/05, 82 y.o.   MRN: [id number] [facility] PROGRESS NOTE  PCP: Bobbye Morton, MD  HPI/Subjective: Patient has no complaints   Objective: Filed Vitals:   07/24/15 2056 07/25/15 0638  BP: 117/74 98/48  Pulse: 85 111  Temp: 97.7 F (36.5 C) 98.2 F (36.8 C)  Resp: 18 24    Filed Weights   07/24/15 1655 07/24/15 2031 07/25/15 0638  Weight: 94.4 kg (208 lb 1.8 oz) 93.9 kg (207 lb 0.2 oz) 92.1 kg (203 lb 0.7 oz)    ROS: Review of Systems  Constitutional: Negative for fever and chills.  Eyes: Negative for blurred vision.  Respiratory: Negative for shortness of breath.   Cardiovascular: Negative for chest pain.  Gastrointestinal: Negative for nausea, vomiting, abdominal pain, diarrhea and constipation.  Genitourinary: Negative for dysuria.  Neurological: Negative for dizziness and headaches.   Exam: Physical Exam  HENT:  Nose: No mucosal edema.  Mouth/Throat: No oropharyngeal exudate or posterior oropharyngeal edema.  Eyes: Conjunctivae, EOM and lids are normal. Pupils are equal, round, and reactive to light.  Neck: No JVD present. Carotid bruit is not present. No edema present. No thyroid mass and no thyromegaly present.  Cardiovascular: S1 normal and S2 normal.  Exam reveals no gallop.   Murmur heard.  Systolic murmur is present with a grade of 2/6  Pulses:      Dorsalis pedis pulses are 2+ on the right side, and 2+ on the left side.  Respiratory: No respiratory distress. He has no wheezes. He has rhonchi in the right middle field, the right lower field, the left middle field and the left lower field. He has no rales.  GI: Soft. Bowel sounds are normal. There is no tenderness.  Musculoskeletal:       Right ankle: He exhibits swelling.       Left ankle: He exhibits swelling.  Pain when flexing the right hip.  Lymphadenopathy:    He has no cervical adenopathy.  Neurological: He is alert.  Skin: Skin is  warm. Nails show no clubbing.  Stage I decubitus as per nurse on the right buttock. Dry skin on the lower extremities.  Psychiatric: He has a normal mood and affect.      Data Reviewed: Basic Metabolic Panel:  Recent Labs Lab 07/22/15 1506 07/22/15 1628 07/23/15 0451 07/24/15 0505 07/24/15 1712  NA 135 138 137  --   --   K 2.3* 2.4* 2.7* 4.1  --   CL 98* 99* 100*  --   --   CO2 --   --   GLUCOSE 220* 163* 100*  --   --   BUN 38* 39* 42*  --   --   CREATININE 5.53* 5.67* 5.83*  --   --   CALCIUM 7.7* 7.9* 7.5*  --   --   MG  --   --  1.9 2.4  --   PHOS  --   --   --   --  7.0*  CBC:  Recent Labs Lab 07/22/15 1506 07/23/15 0451  WBC 12.9* 10.4  NEUTROABS 10.9*  --   HGB 8.6* 8.9*  HCT 28.7* 29.5*  MCV 82.3 85.9  PLT 304 268    Recent Results (from the past 240 hour(s))  Culture, blood (routine x 2)     Status: None (Preliminary result)   Collection Time: 07/22/15  6:21 PM  Result Value Ref Range Status  Specimen Description BLOOD LEFT HAND  Final   Special Requests BOTTLES DRAWN AEROBIC AND ANAEROBIC  4CC  Final   Culture  Setup Time   Final    GRAM POSITIVE COCCI IN CLUSTERS AEROBIC BOTTLE ONLY CRITICAL RESULT CALLED TO, READ BACK BY AND VERIFIED WITH: CRYSTAL [facility] 07/23/15 1330 MLM    Culture   Final    METHICILLIN RESISTANT STAPHYLOCOCCUS AUREUS AEROBIC BOTTLE ONLY    Report Status PENDING  Incomplete   Organism ID, Bacteria METHICILLIN RESISTANT STAPHYLOCOCCUS AUREUS  Final      Susceptibility   Methicillin resistant staphylococcus aureus - MIC*    CIPROFLOXACIN >=8 RESISTANT Resistant     ERYTHROMYCIN <=0.25 SENSITIVE Sensitive     GENTAMICIN <=0.5 SENSITIVE Sensitive     OXACILLIN >=4 RESISTANT Resistant     VANCOMYCIN 1 SENSITIVE Sensitive     TRIMETH/SULFA <=10 SENSITIVE Sensitive     CLINDAMYCIN <=0.25 SENSITIVE Sensitive     CEFOXITIN SCREEN Value in next row Resistant      POSITIVECEFOXITIN SCREEN - This test may be used to  predict mecA-mediated oxacillin resistance, and it is based on the cefoxitin disk screen test.  The cefoxitin screen and oxacillin work in combination to determine the final interpretation reported for oxacillin.     Inducible Clindamycin Value in next row Sensitive      POSITIVECEFOXITIN SCREEN - This test may be used to predict mecA-mediated oxacillin resistance, and it is based on the cefoxitin disk screen test.  The cefoxitin screen and oxacillin work in combination to determine the final interpretation reported for oxacillin.     TETRACYCLINE Value in next row Sensitive      SENSITIVE<=1    * METHICILLIN RESISTANT STAPHYLOCOCCUS AUREUS  Culture, blood (routine x 2)     Status: None (Preliminary result)   Collection Time: 07/22/15  6:21 PM  Result Value Ref Range Status   Specimen Description BLOOD LEFT HAND  Final   Special Requests   Final    BOTTLES DRAWN AEROBIC AND ANAEROBIC  AER 2CC ANA 4CC   Culture  Setup Time   Final    GRAM POSITIVE COCCI IN CLUSTERS IN BOTH AEROBIC AND ANAEROBIC BOTTLES CRITICAL RESULT CALLED TO, READ BACK BY AND VERIFIED WITH: CRYSTAL SCARPENA 07/23/15 1400 MLM    Culture METHICILLIN RESISTANT STAPHYLOCOCCUS AUREUS  Final   Report Status PENDING  Incomplete   Organism ID, Bacteria METHICILLIN RESISTANT STAPHYLOCOCCUS AUREUS  Final      Susceptibility   Methicillin resistant staphylococcus aureus - MIC*    CIPROFLOXACIN >=8 RESISTANT Resistant     ERYTHROMYCIN 0.5 SENSITIVE Sensitive     GENTAMICIN <=0.5 SENSITIVE Sensitive     OXACILLIN >=4 RESISTANT Resistant     VANCOMYCIN 1 SENSITIVE Sensitive     TRIMETH/SULFA <=10 SENSITIVE Sensitive     CLINDAMYCIN <=0.25 SENSITIVE Sensitive     CEFOXITIN SCREEN Value in next row Resistant      POSITIVECEFOXITIN SCREEN - This test may be used to predict mecA-mediated oxacillin resistance, and it is based on the cefoxitin disk screen test.  The cefoxitin screen and oxacillin work in combination to determine the final  interpretation reported for oxacillin.     Inducible Clindamycin Value in next row Sensitive      POSITIVECEFOXITIN SCREEN - This test may be used to predict mecA-mediated oxacillin resistance, and it is based on the cefoxitin disk screen test.  The cefoxitin screen and oxacillin work in combination to determine the final interpretation reported  for oxacillin.     TETRACYCLINE Value in next row Sensitive      SENSITIVE<=1    * METHICILLIN RESISTANT STAPHYLOCOCCUS AUREUS  Blood Culture ID Panel (Reflexed)     Status: Abnormal   Collection Time: 07/22/15  6:21 PM  Result Value Ref Range Status   Enterococcus species NOT DETECTED NOT DETECTED Final   Listeria monocytogenes NOT DETECTED NOT DETECTED Final   Staphylococcus species DETECTED (A) NOT DETECTED Final   Staphylococcus aureus DETECTED (A) NOT DETECTED Final    Comment: CRITICAL RESULT CALLED TO, READ BACK BY AND VERIFIED WITH: CRYSTAL SCARPENA 07/23/15 1330 ABOUT STAPH SPECIES,STAPH AUREUS, AND MecA DETECTED MLM    Streptococcus species NOT DETECTED NOT DETECTED Final   Streptococcus agalactiae NOT DETECTED NOT DETECTED Final   Streptococcus pneumoniae NOT DETECTED NOT DETECTED Final   Streptococcus pyogenes NOT DETECTED NOT DETECTED Final   Acinetobacter baumannii NOT DETECTED NOT DETECTED Final   Enterobacteriaceae species NOT DETECTED NOT DETECTED Final   Enterobacter cloacae complex NOT DETECTED NOT DETECTED Final   Escherichia coli NOT DETECTED NOT DETECTED Final   Klebsiella oxytoca NOT DETECTED NOT DETECTED Final   Klebsiella pneumoniae NOT DETECTED NOT DETECTED Final   Proteus species NOT DETECTED NOT DETECTED Final   Serratia marcescens NOT DETECTED NOT DETECTED Final   Haemophilus influenzae NOT DETECTED NOT DETECTED Final   Neisseria meningitidis NOT DETECTED NOT DETECTED Final   Pseudomonas aeruginosa NOT DETECTED NOT DETECTED Final   Candida albicans NOT DETECTED NOT DETECTED Final   Candida glabrata NOT DETECTED NOT  DETECTED Final   Candida krusei NOT DETECTED NOT DETECTED Final   Candida parapsilosis NOT DETECTED NOT DETECTED Final   Candida tropicalis NOT DETECTED NOT DETECTED Final   Carbapenem resistance NOT DETECTED NOT DETECTED Final   Methicillin resistance DETECTED (A) NOT DETECTED Final   Vancomycin resistance NOT DETECTED NOT DETECTED Final  MRSA PCR Screening     Status: Abnormal   Collection Time: 07/23/15  5:24 AM  Result Value Ref Range Status   MRSA by PCR POSITIVE (A) NEGATIVE Final    Comment:        The GeneXpert MRSA Assay (FDA approved for NASAL specimens only), is one component of a comprehensive MRSA colonization surveillance program. It is not intended to diagnose MRSA infection nor to guide or monitor treatment for MRSA infections. CRITICAL RESULT CALLED TO, READ BACK BY AND VERIFIED WITH: ALISHA BABB ON 07/23/15 AT 0735 BY QSD      Studies: No results found.  Scheduled Meds: . allopurinol  100 mg Oral Daily  . aspirin EC  81 mg Oral Daily  . atorvastatin  20 mg Oral Daily  . clopidogrel  75 mg Oral Daily  . diltiazem  180 mg Oral Daily  . epoetin (EPOGEN/PROCRIT) injection  10,000 Units Intravenous Q T,Th,Sa-HD  . ferrous sulfate  325 mg Oral Daily  . heparin  5,000 Units Subcutaneous 3 times per day  . latanoprost  1 drop Both Eyes QHS  . pregabalin  75 mg Oral Daily  . senna  17.2 mg Oral QHS  . vancomycin  1,000 mg Intravenous Q T,Th,Sa-HD    Assessment/Plan:  1. Staph aureus sepsis with MRSA bacteremia, Likely source of fever is dialysis catheter left chest that was removed.  Continue vancomycin during HD for 2 weeks and discontinue Unasyn per Dr. Sampson Goon. F/u Repeated blood cultures and echo.  He has LLL consolidation and atelectasis but no aspiration pneumonia. 2. Hypokalemia and hypomagnesemia,  replaced, improved. 3. End-stage renal disease on dialysis Tuesday Thursday and Saturday. Permacath removed, on temporary catheter until sepsis clears.   need permacath again. 4. Hypotension hold his blood pressure medications 5. Peripheral vascular disease on aspirin and Plavix 6. Hyperlipidemia unspecified on atorvastatin 7. History of gout on allopurinol 8. Glaucoma unspecified continue latanoprost 9. Anemia of chronic disease Procrit with dialysis and continue iron supplementation  Code Status:     Code Status Orders        Start     Ordered   07/22/15 2038  Full code   Continuous     07/22/15 2038    Code Status History    Date Active Date Inactive Code Status Order ID Comments User Context   06/24/2015  4:07 PM 06/27/2015 12:37 AM Full Code [id number]  Auburn Bilberry, MD Inpatient   05/30/2015  5:22 PM 06/05/2015  6:42 PM Full Code [id number]  Trudee Kuster, RN Inpatient   05/30/2015  1:59 PM 05/30/2015  5:22 PM DNR [id number]  Shaune Pollack, MD Inpatient   05/29/2015  3:57 PM 05/29/2015  7:32 PM Full Code [id number]  Annice Needy, MD Inpatient   04/08/2015  8:31 PM 04/17/2015 11:42 PM Full Code [id number]  Gracelyn Nurse, MD Inpatient   03/04/2015  4:13 PM 03/11/2015  7:00 PM DNR [id number]  Houston Siren, MD Inpatient      Greater than 50% time was spent on coordination of care and face-to-face counseling. Disposition Plan: possible discharge back to SNF in 2 days. Discussed with Dr. Sampson Goon.  Consultants:  Nephrology  Vascular surgery  Antibiotics:  Vancomycin  Unasyn  Time spent: 38 minutes  Shaune Pollack  [facility] Hospitalists

## 2015-07-25 NOTE — Progress Notes (Addendum)
Initial Nutrition Assessment  DOCUMENTATION CODES:   Non-severe (moderate) malnutrition in context of chronic illness  INTERVENTION:  Meals and snacks: Cater to pt preferences Medical Nutrition Supplement: Will add honey thick mightyshake and magic cup BID for added nutrition   NUTRITION DIAGNOSIS:   Increased nutrient needs related to wound healing, chronic illness as evidenced by estimated needs.    GOAL:   Patient will meet greater than or equal to 90% of their needs    MONITOR:    (Energy intake, Electrolyte and renal profile)  REASON FOR ASSESSMENT:    (diet order)    ASSESSMENT:      Pt with bacteremia, permacath removed.  Possible aspiration pneumonia.  Noted per SLP note pt has not been compliant to honey thick liquid diet.   Past Medical History  Diagnosis Date  . ESRD (end stage renal disease) (HCC)   . Hypertension   . CHF (congestive heart failure) (HCC)   . Gout   . Glaucoma   . Neuropathy (HCC)   . Stroke (HCC)   . Peripheral vascular disease (HCC)   . Coronary artery disease   . A-fib (HCC)   . Dysrhythmia   . Blindness of right eye   . Anemia     iron deficiency    Current Nutrition: limited documented intake.  Pt reports good appetite. Spoke with CNA and reports ate 100% of waffle and juice and few bites of cream of wheat.   Food/Nutrition-Related History: Pt reports good appetite prior to admission   Scheduled Medications:  . allopurinol  100 mg Oral Daily  . ampicillin-sulbactam (UNASYN) IV  3 g Intravenous Q24H  . aspirin EC  81 mg Oral Daily  . atorvastatin  20 mg Oral Daily  . clopidogrel  75 mg Oral Daily  . diltiazem  180 mg Oral Daily  . ferrous sulfate  325 mg Oral Daily  . heparin  5,000 Units Subcutaneous 3 times per day  . latanoprost  1 drop Both Eyes QHS  . pregabalin  75 mg Oral Daily  . senna  17.2 mg Oral QHS  . vancomycin  1,000 mg Intravenous Q T,Th,Sa-HD       Electrolyte/Renal Profile and Glucose  Profile:   Recent Labs Lab 07/22/15 1506 07/22/15 1628 07/23/15 0451 07/24/15 0505 07/24/15 1712  NA 135 138 137  --   --   K 2.3* 2.4* 2.7* 4.1  --   CL 98* 99* 100*  --   --   CO2 --   --   BUN 38* 39* 42*  --   --   CREATININE 5.53* 5.67* 5.83*  --   --   CALCIUM 7.7* 7.9* 7.5*  --   --   MG  --   --  1.9 2.4  --   PHOS  --   --   --   --  7.0*  GLUCOSE 220* 163* 100*  --   --     Gastrointestinal Profile: Last BM:1/17   Nutrition-Focused Physical Exam Findings: Nutrition-Focused physical exam completed. Findings are no fat depletion, normal-mild/moderate muscle depletion, and none edema.      Weight Change: 21% of wt loss in the last 4 months    Diet Order:  DIET - DYS 1 Room service appropriate?: Yes; Fluid consistency:: Honey Thick  Skin:   pressure ulcer stage II buttocks   Height:   Ht Readings from Last 1 Encounters:  07/22/15  (1.854 m)  Weight:   Wt Readings from Last 1 Encounters:  07/25/15 203 lb 0.7 oz (92.1 kg)    Ideal Body Weight:     BMI:  Body mass index is 26.79 kg/(m^2).  Estimated Nutritional Needs:   Kcal:  BEE 1688 kcals (IF 1.1-1.3, AF 1.2) 1610-9604 kcals/d.   Protein:  (1.2-1.5 g/kg) 110-138 g/d  Fluid:  (1000 + UOP)  EDUCATION NEEDS:   No education needs identified at this time  MODERATE Care Level  Chicquita Mendel B. Freida Busman, RD, LDN [phone] (pager) Weekend/On-Call pager [phone])

## 2015-07-25 NOTE — Progress Notes (Signed)
*  PRELIMINARY RESULTS* Echocardiogram 2D Echocardiogram has been performed.  Sean Delgado 07/25/2015, 9:49 PM

## 2015-07-25 NOTE — Clinical Social Work Note (Signed)
Clinical Social Work Assessment  Patient Details  Name: Sean Delgado. MRN: [id number] Date of Birth: 05/03/1934  Date of referral:  07/25/15               Reason for consult:  Facility Placement                Permission sought to share information with:  Facility Industrial/product designer granted to share information::  Yes, Verbal Permission Granted  Name::        Agency::     Relationship::     Contact Information:     Housing/Transportation Living arrangements for the past 2 months:  Skilled Nursing Facility Source of Information:  Other (Comment Required) (neice) Patient Interpreter Needed:  None Criminal Activity/Legal Involvement Pertinent to Current Situation/Hospitalization:  No - Comment as needed Significant Relationships:  Adult Children (neice) Lives with:  Facility Resident Do you feel safe going back to the place where you live?  Yes Need for family participation in patient care:  No (Coment)  Care giving concerns:  Patient is a long term resident at [facility].   Social Worker assessment / plan:  CSW spoke with Tiffany at [facility] and patient is a long term resident with them and they can take patient back at discharge. CSW touched base with patient's niece: Sean Delgado: [phone] and she confirmed the above and that family does wish for patient to return to [facility] when time. FL2 initiated in epic and sent for signature.  Employment status:  Retired Database administrator PT Recommendations:  Not assessed at this time Information / Referral to community resources:  Skilled Nursing Facility  Patient/Family's Response to care:  [name] niece was Adult nurse of CSW contact.  Patient/Family's Understanding of and Emotional Response to Diagnosis, Current Treatment, and Prognosis:  [name] niece supportive of patient's return to [facility].  Emotional Assessment Appearance:  Appears stated age Attitude/Demeanor/Rapport:  Unable to  Assess Affect (typically observed):  Unable to Assess Orientation:  Oriented to Self Alcohol / Substance use:  Not Applicable Psych involvement (Current and /or in the community):  No (Comment)  Discharge Needs  Concerns to be addressed:  Care Coordination Readmission within the last 30 days:  Yes Current discharge risk:  None Barriers to Discharge:  No Barriers Identified   York Spaniel, LCSW 07/25/2015, 10:06 AM

## 2015-07-25 NOTE — Progress Notes (Signed)
Subjective:  Patient completed hemodialysis yesterday. Temporary right femoral dialysis catheter was placed. Patient resting comfortably at the moment.    Objective:  Vital signs in last 24 hours:  Temp:  [97.7 F (36.5 C)-98.5 F (36.9 C)] 98.2 F (36.8 C) (01/17 1610) Pulse Rate:  [62-131] 111 (01/17 0638) Resp:  [16-24] 24 (01/17 0638) BP: (88-125)/(48-74) 98/48 mmHg (01/17 0638) SpO2:  [92 %-100 %] 95 % (01/17 9604) Weight:  [92.1 kg (203 lb 0.7 oz)-94.4 kg (208 lb 1.8 oz)] 92.1 kg (203 lb 0.7 oz) (01/17 0638)  Weight change:  Filed Weights   07/24/15 1655 07/24/15 2031 07/25/15 5409  Weight: 94.4 kg (208 lb 1.8 oz) 93.9 kg (207 lb 0.2 oz) 92.1 kg (203 lb 0.7 oz)    Intake/Output:    Intake/Output Summary (Last 24 hours) at 07/25/15 0940 Last data filed at 07/25/15 0031  Gross per 24 hour  Intake    100 ml  Output   1000 ml  Net   -900 ml     Physical Exam: General:  no acute distress, laying in the bed   HEENT  anicteric, moist oral mucous membranes   Neck  supple   Pulm/lungs  clear to auscultation bilateral, normal effort  CVS/Heart  S1S2 no rubs  Abdomen:   soft, nontender, nondistended   Extremities:  no peripheral edema   Neurologic:  alert, oriented   Skin:  no acute rashes   Access:  temporary right femoral dialysis catheter        Basic Metabolic Panel:   Recent Labs Lab 07/22/15 1506 07/22/15 1628 07/23/15 0451 07/24/15 0505 07/24/15 1712  NA 135 138 137  --   --   K 2.3* 2.4* 2.7* 4.1  --   CL 98* 99* 100*  --   --   CO2 24 24 27   --   --   GLUCOSE 220* 163* 100*  --   --   BUN 38* 39* 42*  --   --   CREATININE 5.53* 5.67* 5.83*  --   --   CALCIUM 7.7* 7.9* 7.5*  --   --   MG  --   --  1.9 2.4  --   PHOS  --   --   --   --  7.0*     CBC:  Recent Labs Lab 07/22/15 1506 07/23/15 0451  WBC 12.9* 10.4  NEUTROABS 10.9*  --   HGB 8.6* 8.9*  HCT 28.7* 29.5*  MCV 82.3 85.9  PLT 304 268      Microbiology:  Recent  Results (from the past 720 hour(s))  Culture, blood (routine x 2)     Status: None (Preliminary result)   Collection Time: 07/22/15  6:21 PM  Result Value Ref Range Status   Specimen Description BLOOD LEFT HAND  Final   Special Requests BOTTLES DRAWN AEROBIC AND ANAEROBIC  4CC  Final   Culture  Setup Time   Final    GRAM POSITIVE COCCI IN CLUSTERS AEROBIC BOTTLE ONLY CRITICAL RESULT CALLED TO, READ BACK BY AND VERIFIED WITH: CRYSTAL SCARPENA 07/23/15 1330 MLM    Culture   Final    METHICILLIN RESISTANT STAPHYLOCOCCUS AUREUS AEROBIC BOTTLE ONLY    Report Status PENDING  Incomplete   Organism ID, Bacteria METHICILLIN RESISTANT STAPHYLOCOCCUS AUREUS  Final      Susceptibility   Methicillin resistant staphylococcus aureus - MIC*    CIPROFLOXACIN >=8 RESISTANT Resistant     ERYTHROMYCIN <=0.25 SENSITIVE Sensitive  GENTAMICIN <=0.5 SENSITIVE Sensitive     OXACILLIN >=4 RESISTANT Resistant     VANCOMYCIN 1 SENSITIVE Sensitive     TRIMETH/SULFA <=10 SENSITIVE Sensitive     CLINDAMYCIN <=0.25 SENSITIVE Sensitive     CEFOXITIN SCREEN Value in next row Resistant      POSITIVECEFOXITIN SCREEN - This test may be used to predict mecA-mediated oxacillin resistance, and it is based on the cefoxitin disk screen test.  The cefoxitin screen and oxacillin work in combination to determine the final interpretation reported for oxacillin.     Inducible Clindamycin Value in next row Sensitive      POSITIVECEFOXITIN SCREEN - This test may be used to predict mecA-mediated oxacillin resistance, and it is based on the cefoxitin disk screen test.  The cefoxitin screen and oxacillin work in combination to determine the final interpretation reported for oxacillin.     TETRACYCLINE Value in next row Sensitive      SENSITIVE<=1    * METHICILLIN RESISTANT STAPHYLOCOCCUS AUREUS  Culture, blood (routine x 2)     Status: None (Preliminary result)   Collection Time: 07/22/15  6:21 PM  Result Value Ref Range Status    Specimen Description BLOOD LEFT HAND  Final   Special Requests   Final    BOTTLES DRAWN AEROBIC AND ANAEROBIC  AER 2CC ANA 4CC   Culture  Setup Time   Final    GRAM POSITIVE COCCI IN CLUSTERS IN BOTH AEROBIC AND ANAEROBIC BOTTLES CRITICAL RESULT CALLED TO, READ BACK BY AND VERIFIED WITH: CRYSTAL SCARPENA 07/23/15 1400 MLM    Culture METHICILLIN RESISTANT STAPHYLOCOCCUS AUREUS  Final   Report Status PENDING  Incomplete   Organism ID, Bacteria METHICILLIN RESISTANT STAPHYLOCOCCUS AUREUS  Final      Susceptibility   Methicillin resistant staphylococcus aureus - MIC*    CIPROFLOXACIN >=8 RESISTANT Resistant     ERYTHROMYCIN 0.5 SENSITIVE Sensitive     GENTAMICIN <=0.5 SENSITIVE Sensitive     OXACILLIN >=4 RESISTANT Resistant     VANCOMYCIN 1 SENSITIVE Sensitive     TRIMETH/SULFA <=10 SENSITIVE Sensitive     CLINDAMYCIN <=0.25 SENSITIVE Sensitive     CEFOXITIN SCREEN Value in next row Resistant      POSITIVECEFOXITIN SCREEN - This test may be used to predict mecA-mediated oxacillin resistance, and it is based on the cefoxitin disk screen test.  The cefoxitin screen and oxacillin work in combination to determine the final interpretation reported for oxacillin.     Inducible Clindamycin Value in next row Sensitive      POSITIVECEFOXITIN SCREEN - This test may be used to predict mecA-mediated oxacillin resistance, and it is based on the cefoxitin disk screen test.  The cefoxitin screen and oxacillin work in combination to determine the final interpretation reported for oxacillin.     TETRACYCLINE Value in next row Sensitive      SENSITIVE<=1    * METHICILLIN RESISTANT STAPHYLOCOCCUS AUREUS  Blood Culture ID Panel (Reflexed)     Status: Abnormal   Collection Time: 07/22/15  6:21 PM  Result Value Ref Range Status   Enterococcus species NOT DETECTED NOT DETECTED Final   Listeria monocytogenes NOT DETECTED NOT DETECTED Final   Staphylococcus species DETECTED (A) NOT DETECTED Final   Staphylococcus  aureus DETECTED (A) NOT DETECTED Final    Comment: CRITICAL RESULT CALLED TO, READ BACK BY AND VERIFIED WITH: CRYSTAL SCARPENA 07/23/15 1330 ABOUT STAPH SPECIES,STAPH AUREUS, AND MecA DETECTED MLM    Streptococcus species NOT DETECTED NOT DETECTED Final  Streptococcus agalactiae NOT DETECTED NOT DETECTED Final   Streptococcus pneumoniae NOT DETECTED NOT DETECTED Final   Streptococcus pyogenes NOT DETECTED NOT DETECTED Final   Acinetobacter baumannii NOT DETECTED NOT DETECTED Final   Enterobacteriaceae species NOT DETECTED NOT DETECTED Final   Enterobacter cloacae complex NOT DETECTED NOT DETECTED Final   Escherichia coli NOT DETECTED NOT DETECTED Final   Klebsiella oxytoca NOT DETECTED NOT DETECTED Final   Klebsiella pneumoniae NOT DETECTED NOT DETECTED Final   Proteus species NOT DETECTED NOT DETECTED Final   Serratia marcescens NOT DETECTED NOT DETECTED Final   Haemophilus influenzae NOT DETECTED NOT DETECTED Final   Neisseria meningitidis NOT DETECTED NOT DETECTED Final   Pseudomonas aeruginosa NOT DETECTED NOT DETECTED Final   Candida albicans NOT DETECTED NOT DETECTED Final   Candida glabrata NOT DETECTED NOT DETECTED Final   Candida krusei NOT DETECTED NOT DETECTED Final   Candida parapsilosis NOT DETECTED NOT DETECTED Final   Candida tropicalis NOT DETECTED NOT DETECTED Final   Carbapenem resistance NOT DETECTED NOT DETECTED Final   Methicillin resistance DETECTED (A) NOT DETECTED Final   Vancomycin resistance NOT DETECTED NOT DETECTED Final  MRSA PCR Screening     Status: Abnormal   Collection Time: 07/23/15  5:24 AM  Result Value Ref Range Status   MRSA by PCR POSITIVE (A) NEGATIVE Final    Comment:        The GeneXpert MRSA Assay (FDA approved for NASAL specimens only), is one component of a comprehensive MRSA colonization surveillance program. It is not intended to diagnose MRSA infection nor to guide or monitor treatment for MRSA infections. CRITICAL RESULT CALLED  TO, READ BACK BY AND VERIFIED WITH: ALISHA BABB ON 07/23/15 AT 0735 BY QSD     Coagulation Studies: No results for input(s): LABPROT, INR in the last 72 hours.  Urinalysis: No results for input(s): COLORURINE, LABSPEC, PHURINE, GLUCOSEU, HGBUR, BILIRUBINUR, KETONESUR, PROTEINUR, UROBILINOGEN, NITRITE, LEUKOCYTESUR in the last 72 hours.  Invalid input(s): APPERANCEUR    Imaging: Dg Hip Port Unilat With Pelvis 1v Right  07/23/2015  CLINICAL DATA:  Pt is in renal stage failure. Unable to transport downstairs so to be done portable. Best images obtained due to condition. Previous MR right hip on 11-25. Pt is complaining of pain and is unable to move in any way. EXAM: DG HIP (WITH OR WITHOUT PELVIS) 1V PORT RIGHT COMPARISON:  None. FINDINGS: Hips are located. No evidence of pelvic fracture or sacral fracture. Dedicated view of the RIGHT hip demonstrates no femoral neck fracture. IMPRESSION: No fracture or dislocation. Electronically Signed   By: Genevive Bi M.D.   On: 07/23/2015 14:45     Medications:     . allopurinol  100 mg Oral Daily  . ampicillin-sulbactam (UNASYN) IV  3 g Intravenous Q24H  . aspirin EC  81 mg Oral Daily  . atorvastatin  20 mg Oral Daily  . clopidogrel  75 mg Oral Daily  . diltiazem  180 mg Oral Daily  . ferrous sulfate  325 mg Oral Daily  . heparin  5,000 Units Subcutaneous 3 times per day  . latanoprost  1 drop Both Eyes QHS  . pregabalin  75 mg Oral Daily  . senna  17.2 mg Oral QHS  . vancomycin  1,000 mg Intravenous Q T,Th,Sa-HD   sodium chloride, sodium chloride, alteplase, heparin, lidocaine (PF), lidocaine-prilocaine, naLOXone (NARCAN)  injection, pentafluoroprop-tetrafluoroeth  Assessment/ Plan:  82 y.o. african [place] male  with right eye blindness, diastolic congestive heart failure,  hypertension, gout, glaucoma, peripheral neuropathy, atrial fibrillation, anemia, carotid stenosis status post bilateral CEA, CVA, peripheral vascular disease  [facility]phrology/ [facility]/ TTS  1. End Stage Renal Disease/MRSA sepsis:  IJ perncath appears to have completely dislodged.  - Patient completed dialysis yesterday. We will plan for dialysis again tomorrow and then place him back on his regular schedule on Thursday.  Continue vancomycin.  2. Anemia of chronic kidney disease: Hemoglobin 8.9 - Hemoglobin remains low at 8.9. We will restart the patient on Epogen.  3. Secondary Hyperparathyroidism:  - Phosphorus high at 7.0. This was secondary to missed dialysis. Continue to monitor phosphorus.  4. Hypokalemia - Hypokalemia improved. Potassium 4.1 at last check.   LOS: 3 Sean Delgado 1/17/20179:40 AM

## 2015-07-25 NOTE — NC FL2 (Addendum)
[place] MEDICAID [id number] LEVEL OF CARE SCREENING TOOL     IDENTIFICATION  Patient Name: Sean Delgado. Birthdate: 1934/05/26 Sex: male Admission Date (Current Location): 07/22/2015  [place] and [place] Number:  Chiropodist and Address:  [facility], [address] [place], [place] [address]      Provider Number: [id number]  Attending Physician Name and Address:  Shaune Pollack, MD  Relative Name and Phone Number:       Current Level of Care: Hospital Recommended Level of Care: Skilled Nursing Facility Prior Approval Number:    Date Approved/Denied:   PASRR Number:    Discharge Plan: SNF    Current Diagnoses: Patient Active Problem List   Diagnosis Date Noted  . Malnutrition of moderate degree 07/25/2015  . Aspiration pneumonia (HCC) 07/22/2015  . Hemodialysis catheter dysfunction (HCC) 06/24/2015  . Pressure ulcer 05/31/2015  . Sepsis (HCC) 05/30/2015  . Hypoxia 05/30/2015  . Acute respiratory failure (HCC) 04/08/2015  . CHF (congestive heart failure) (HCC) 03/04/2015    Orientation RESPIRATION BLADDER Height & Weight       O2, Normal (2 liters) Incontinent   203 lbs.  BEHAVIORAL SYMPTOMS/MOOD NEUROLOGICAL BOWEL NUTRITION STATUS     (none) Incontinent Diet  AMBULATORY STATUS COMMUNICATION OF NEEDS Skin   Extensive Assist Verbally                         Personal Care Assistance Level of Assistance  Dressing, Bathing Bathing Assistance: Limited assistance   Dressing Assistance: Limited assistance     Functional Limitations Info             SPECIAL CARE FACTORS FREQUENCY                       Contractures Contractures Info: Not present    Additional Factors Info  Code Status, Allergies, Isolation Precautions Code Status Info: Full Code Allergies Info: NKA     Isolation Precautions Info: MRSA   Enteric precautions (UV disinfection)   Current Medications (07/25/2015):  This is the current hospital  active medication list Current Facility-Administered Medications  Medication Dose Route Frequency Provider Last Rate Last Dose  . 0.9 %  sodium chloride infusion  100 mL Intravenous PRN Munsoor Lateef, MD      . 0.9 %  sodium chloride infusion  100 mL Intravenous PRN Munsoor Lateef, MD      . allopurinol (ZYLOPRIM) tablet 100 mg  100 mg Oral Daily Altamese Dilling, MD   100 mg at 07/25/15 0951  . alteplase (CATHFLO ACTIVASE) injection 2 mg  2 mg Intracatheter Once PRN Munsoor Lateef, MD      . Ampicillin-Sulbactam (UNASYN) 3 g in sodium chloride 0.9 % 100 mL IVPB  3 g Intravenous Q24H Bertram Savin, RPH   3 g at 07/24/15 2136  . aspirin EC tablet 81 mg  81 mg Oral Daily Altamese Dilling, MD   81 mg at 07/25/15 0950  . atorvastatin (LIPITOR) tablet 20 mg  20 mg Oral Daily Altamese Dilling, MD   20 mg at 07/25/15 0950  . clopidogrel (PLAVIX) tablet 75 mg  75 mg Oral Daily Altamese Dilling, MD   75 mg at 07/25/15 0950  . diltiazem (CARDIZEM CD) 24 hr capsule 180 mg  180 mg Oral Daily Altamese Dilling, MD   180 mg at 07/24/15 2137  . epoetin alfa (EPOGEN,PROCRIT) injection 10,000 Units  10,000 Units Intravenous Q T,Th,Sa-HD Munsoor  Lateef, MD      . ferrous sulfate tablet 325 mg  325 mg Oral Daily Altamese Dilling, MD   325 mg at 07/25/15 0950  . heparin injection 1,000 Units  1,000 Units Dialysis PRN Munsoor Lateef, MD      . heparin injection 5,000 Units  5,000 Units Subcutaneous 3 times per day Altamese Dilling, MD   5,000 Units at 07/25/15 0551  . latanoprost (XALATAN) 0.005 % ophthalmic solution 1 drop  1 drop Both Eyes QHS Altamese Dilling, MD   1 drop at 07/24/15 2136  . lidocaine (PF) (XYLOCAINE) 1 % injection 5 mL  5 mL Intradermal PRN Munsoor Lateef, MD      . lidocaine-prilocaine (EMLA) cream 1 application  1 application Topical PRN Munsoor Lateef, MD      . naloxone (NARCAN) injection 2 mg  2 mg Intravenous PRN Arnaldo Natal, MD   2 mg at  07/22/15 1746  . pentafluoroprop-tetrafluoroeth (GEBAUERS) aerosol 1 application  1 application Topical PRN Munsoor Lateef, MD      . pregabalin (LYRICA) capsule 75 mg  75 mg Oral Daily Altamese Dilling, MD   75 mg at 07/25/15 0951  . senna (SENOKOT) tablet 17.2 mg  17.2 mg Oral QHS Altamese Dilling, MD   17.2 mg at 07/24/15 2136  . vancomycin (VANCOCIN) IVPB 1000 mg/200 mL premix  1,000 mg Intravenous Q T,Th,Sa-HD Cindi Carbon, [facility]         Discharge Medications: Please see discharge summary for a list of discharge medications.  Relevant Imaging Results:  Relevant Lab Results:   Additional Information    York Spaniel, LCSW

## 2015-07-26 LAB — PHOSPHORUS: Phosphorus: 5.9 mg/dL — ABNORMAL HIGH (ref 2.5–4.6)

## 2015-07-26 MED ORDER — OXYCODONE-ACETAMINOPHEN 5-325 MG PO TABS
1.0000 | ORAL_TABLET | Freq: Four times a day (QID) | ORAL | Status: DC | PRN
  Administered 2015-07-26 – 2015-07-28 (×2): 1 via ORAL
  Filled 2015-07-26 (×3): qty 1

## 2015-07-26 MED ORDER — BENZONATATE 100 MG PO CAPS
100.0000 mg | ORAL_CAPSULE | Freq: Three times a day (TID) | ORAL | Status: DC | PRN
  Administered 2015-07-26 – 2015-07-27 (×2): 100 mg via ORAL
  Filled 2015-07-26 (×2): qty 1

## 2015-07-26 MED ORDER — VANCOMYCIN HCL IN DEXTROSE 1-5 GM/200ML-% IV SOLN
1000.0000 mg | INTRAVENOUS | Status: AC
  Administered 2015-07-26: 1000 mg via INTRAVENOUS
  Filled 2015-07-26: qty 200

## 2015-07-26 NOTE — Progress Notes (Signed)
Subjective:  Patient completed hemodialysis today. Tolerated well. Repeat blood cultures still pending.    Objective:  Vital signs in last 24 hours:  Temp:  [97.9 F (36.6 C)-98.3 F (36.8 C)] 97.9 F (36.6 C) (01/18 1338) Pulse Rate:  [106-135] 106 (01/18 1338) Resp:  [17] 17 (01/18 1338) BP: (112-137)/(56-74) 125/59 mmHg (01/18 1338) SpO2:  [88 %-95 %] 88 % (01/18 1338)  Weight change:  Filed Weights   07/24/15 1655 07/24/15 2031 07/25/15 1610  Weight: 94.4 kg (208 lb 1.8 oz) 93.9 kg (207 lb 0.2 oz) 92.1 kg (203 lb 0.7 oz)    Intake/Output:    Intake/Output Summary (Last 24 hours) at 07/26/15 1344 Last data filed at 07/26/15 0854  Gross per 24 hour  Intake    480 ml  Output      0 ml  Net    480 ml     Physical Exam: General:  no acute distress, laying in the bed   HEENT  anicteric, moist oral mucous membranes   Neck  supple   Pulm/lungs  clear to auscultation bilateral, normal effort  CVS/Heart  S1S2 no rubs  Abdomen:   soft, nontender, nondistended   Extremities:  no peripheral edema   Neurologic:  alert, oriented   Skin:  no acute rashes   Access:  temporary right femoral dialysis catheter        Basic Metabolic Panel:   Recent Labs Lab 07/22/15 1506 07/22/15 1628 07/23/15 0451 07/24/15 0505 07/24/15 1712 07/26/15 0537  NA 135 138 137  --   --   --   K 2.3* 2.4* 2.7* 4.1  --   --   CL 98* 99* 100*  --   --   --   CO2 --   --   --   GLUCOSE 220* 163* 100*  --   --   --   BUN 38* 39* 42*  --   --   --   CREATININE 5.53* 5.67* 5.83*  --   --   --   CALCIUM 7.7* 7.9* 7.5*  --   --   --   MG  --   --  1.9 2.4  --   --   PHOS  --   --   --   --  7.0* 5.9*     CBC:  Recent Labs Lab 07/22/15 1506 07/23/15 0451  WBC 12.9* 10.4  NEUTROABS 10.9*  --   HGB 8.6* 8.9*  HCT 28.7* 29.5*  MCV 82.3 85.9  PLT 304 268      Microbiology:  Recent Results (from the past 720 hour(s))  Culture, blood (routine x 2)     Status: None  (Preliminary result)   Collection Time: 07/22/15  6:21 PM  Result Value Ref Range Status   Specimen Description BLOOD LEFT HAND  Final   Special Requests BOTTLES DRAWN AEROBIC AND ANAEROBIC  4CC  Final   Culture  Setup Time   Final    GRAM POSITIVE COCCI IN CLUSTERS AEROBIC BOTTLE ONLY CRITICAL RESULT CALLED TO, READ BACK BY AND VERIFIED WITH: CRYSTAL SCARPENA 07/23/15 1330 MLM    Culture   Final    METHICILLIN RESISTANT STAPHYLOCOCCUS AUREUS AEROBIC BOTTLE ONLY    Report Status PENDING  Incomplete   Organism ID, Bacteria METHICILLIN RESISTANT STAPHYLOCOCCUS AUREUS  Final      Susceptibility   Methicillin resistant staphylococcus aureus - MIC*    CIPROFLOXACIN >=8 RESISTANT Resistant  ERYTHROMYCIN <=0.25 SENSITIVE Sensitive     GENTAMICIN <=0.5 SENSITIVE Sensitive     OXACILLIN >=4 RESISTANT Resistant     VANCOMYCIN 1 SENSITIVE Sensitive     TRIMETH/SULFA <=10 SENSITIVE Sensitive     CLINDAMYCIN <=0.25 SENSITIVE Sensitive     CEFOXITIN SCREEN Value in next row Resistant      POSITIVECEFOXITIN SCREEN - This test may be used to predict mecA-mediated oxacillin resistance, and it is based on the cefoxitin disk screen test.  The cefoxitin screen and oxacillin work in combination to determine the final interpretation reported for oxacillin.     Inducible Clindamycin Value in next row Sensitive      POSITIVECEFOXITIN SCREEN - This test may be used to predict mecA-mediated oxacillin resistance, and it is based on the cefoxitin disk screen test.  The cefoxitin screen and oxacillin work in combination to determine the final interpretation reported for oxacillin.     TETRACYCLINE Value in next row Sensitive      SENSITIVE<=1    * METHICILLIN RESISTANT STAPHYLOCOCCUS AUREUS  Culture, blood (routine x 2)     Status: None (Preliminary result)   Collection Time: 07/22/15  6:21 PM  Result Value Ref Range Status   Specimen Description BLOOD LEFT HAND  Final   Special Requests   Final    BOTTLES  DRAWN AEROBIC AND ANAEROBIC  AER 2CC ANA 4CC   Culture  Setup Time   Final    GRAM POSITIVE COCCI IN CLUSTERS IN BOTH AEROBIC AND ANAEROBIC BOTTLES CRITICAL RESULT CALLED TO, READ BACK BY AND VERIFIED WITH: CRYSTAL SCARPENA 07/23/15 1400 MLM    Culture METHICILLIN RESISTANT STAPHYLOCOCCUS AUREUS  Final   Report Status PENDING  Incomplete   Organism ID, Bacteria METHICILLIN RESISTANT STAPHYLOCOCCUS AUREUS  Final      Susceptibility   Methicillin resistant staphylococcus aureus - MIC*    CIPROFLOXACIN >=8 RESISTANT Resistant     ERYTHROMYCIN 0.5 SENSITIVE Sensitive     GENTAMICIN <=0.5 SENSITIVE Sensitive     OXACILLIN >=4 RESISTANT Resistant     VANCOMYCIN 1 SENSITIVE Sensitive     TRIMETH/SULFA <=10 SENSITIVE Sensitive     CLINDAMYCIN <=0.25 SENSITIVE Sensitive     CEFOXITIN SCREEN Value in next row Resistant      POSITIVECEFOXITIN SCREEN - This test may be used to predict mecA-mediated oxacillin resistance, and it is based on the cefoxitin disk screen test.  The cefoxitin screen and oxacillin work in combination to determine the final interpretation reported for oxacillin.     Inducible Clindamycin Value in next row Sensitive      POSITIVECEFOXITIN SCREEN - This test may be used to predict mecA-mediated oxacillin resistance, and it is based on the cefoxitin disk screen test.  The cefoxitin screen and oxacillin work in combination to determine the final interpretation reported for oxacillin.     TETRACYCLINE Value in next row Sensitive      SENSITIVE<=1    * METHICILLIN RESISTANT STAPHYLOCOCCUS AUREUS  Blood Culture ID Panel (Reflexed)     Status: Abnormal   Collection Time: 07/22/15  6:21 PM  Result Value Ref Range Status   Enterococcus species NOT DETECTED NOT DETECTED Final   Listeria monocytogenes NOT DETECTED NOT DETECTED Final   Staphylococcus species DETECTED (A) NOT DETECTED Final   Staphylococcus aureus DETECTED (A) NOT DETECTED Final    Comment: CRITICAL RESULT CALLED TO, READ  BACK BY AND VERIFIED WITH: CRYSTAL SCARPENA 07/23/15 1330 ABOUT STAPH SPECIES,STAPH AUREUS, AND MecA DETECTED MLM  Streptococcus species NOT DETECTED NOT DETECTED Final   Streptococcus agalactiae NOT DETECTED NOT DETECTED Final   Streptococcus pneumoniae NOT DETECTED NOT DETECTED Final   Streptococcus pyogenes NOT DETECTED NOT DETECTED Final   Acinetobacter baumannii NOT DETECTED NOT DETECTED Final   Enterobacteriaceae species NOT DETECTED NOT DETECTED Final   Enterobacter cloacae complex NOT DETECTED NOT DETECTED Final   Escherichia coli NOT DETECTED NOT DETECTED Final   Klebsiella oxytoca NOT DETECTED NOT DETECTED Final   Klebsiella pneumoniae NOT DETECTED NOT DETECTED Final   Proteus species NOT DETECTED NOT DETECTED Final   Serratia marcescens NOT DETECTED NOT DETECTED Final   Haemophilus influenzae NOT DETECTED NOT DETECTED Final   Neisseria meningitidis NOT DETECTED NOT DETECTED Final   Pseudomonas aeruginosa NOT DETECTED NOT DETECTED Final   Candida albicans NOT DETECTED NOT DETECTED Final   Candida glabrata NOT DETECTED NOT DETECTED Final   Candida krusei NOT DETECTED NOT DETECTED Final   Candida parapsilosis NOT DETECTED NOT DETECTED Final   Candida tropicalis NOT DETECTED NOT DETECTED Final   Carbapenem resistance NOT DETECTED NOT DETECTED Final   Methicillin resistance DETECTED (A) NOT DETECTED Final   Vancomycin resistance NOT DETECTED NOT DETECTED Final  MRSA PCR Screening     Status: Abnormal   Collection Time: 07/23/15  5:24 AM  Result Value Ref Range Status   MRSA by PCR POSITIVE (A) NEGATIVE Final    Comment:        The GeneXpert MRSA Assay (FDA approved for NASAL specimens only), is one component of a comprehensive MRSA colonization surveillance program. It is not intended to diagnose MRSA infection nor to guide or monitor treatment for MRSA infections. CRITICAL RESULT CALLED TO, READ BACK BY AND VERIFIED WITH: ALISHA BABB ON 07/23/15 AT 0735 BY QSD      Coagulation Studies: No results for input(s): LABPROT, INR in the last 72 hours.  Urinalysis: No results for input(s): COLORURINE, LABSPEC, PHURINE, GLUCOSEU, HGBUR, BILIRUBINUR, KETONESUR, PROTEINUR, UROBILINOGEN, NITRITE, LEUKOCYTESUR in the last 72 hours.  Invalid input(s): APPERANCEUR    Imaging: No results found.   Medications:     . allopurinol  100 mg Oral Daily  . aspirin EC  81 mg Oral Daily  . atorvastatin  20 mg Oral Daily  . clopidogrel  75 mg Oral Daily  . diltiazem  180 mg Oral Daily  . epoetin (EPOGEN/PROCRIT) injection  10,000 Units Intravenous Q T,Th,Sa-HD  . ferrous sulfate  325 mg Oral Daily  . heparin  5,000 Units Subcutaneous 3 times per day  . latanoprost  1 drop Both Eyes QHS  . pregabalin  75 mg Oral Daily  . senna  17.2 mg Oral QHS  . vancomycin  1,000 mg Intravenous Q T,Th,Sa-HD  . vancomycin  1,000 mg Intravenous STAT   sodium chloride, sodium chloride, alteplase, heparin, lidocaine (PF), lidocaine-prilocaine, naLOXone (NARCAN)  injection, pentafluoroprop-tetrafluoroeth  Assessment/ Plan:  82 y.o. african [place] male  with right eye blindness, diastolic congestive heart failure, hypertension, gout, glaucoma, peripheral neuropathy, atrial fibrillation, anemia, carotid stenosis status post bilateral CEA, CVA, peripheral vascular disease  [facility]/ [facility]/ TTS  1. End Stage Renal Disease/MRSA sepsis:  IJ perncath appears to have completely dislodged.  - patient had dialysis today.  We will get him back on his regular schedule tomorrow.  We are awaiting negative blood cultures prior to replacing a PermCath.  2. Anemia of chronic kidney disease: Hemoglobin 8.9 - continue Epogen 10,000 units IV with dialysis  3. Secondary  Hyperparathyroidism:  - phosphorus down to 5.9 today.  4. Hypokalemia -resolved at the moment.  Most recent potassium 4.1   LOS: 4 Jadin Creque 1/18/20171:44 PM

## 2015-07-26 NOTE — Progress Notes (Signed)
[facility] CLINIC INFECTIOUS DISEASE PROGRESS NOTE Date of Admission:  07/22/2015     ID: [name]. is a 82 y.o. male with  MRSA bacteremia, HD patient  Active Problems:   Acute respiratory failure (HCC)   Sepsis (HCC)   Aspiration pneumonia (HCC)   Malnutrition of moderate degree   Subjective: Had HD today through R groin HD temp cath. Denies fever, pain Cx neg 1/17   ROS  Eleven systems are reviewed and negative except per hpi  Medications:  Antibiotics Given (last 72 hours)    Date/Time Action Medication Dose Rate   07/23/15 2131 Given   piperacillin-tazobactam (ZOSYN) IVPB 3.375 g 3.375 g 12.5 mL/hr   07/24/15 1000 Given   piperacillin-tazobactam (ZOSYN) IVPB 3.375 g 3.375 g 12.5 mL/hr   07/24/15 2136 Given   Ampicillin-Sulbactam (UNASYN) 3 g in sodium chloride 0.9 % 100 mL IVPB 3 g 100 mL/hr   07/26/15 1346 Given   vancomycin (VANCOCIN) IVPB 1000 mg/200 mL premix 1,000 mg 200 mL/hr     . allopurinol  100 mg Oral Daily  . aspirin EC  81 mg Oral Daily  . atorvastatin  20 mg Oral Daily  . clopidogrel  75 mg Oral Daily  . diltiazem  180 mg Oral Daily  . epoetin (EPOGEN/PROCRIT) injection  10,000 Units Intravenous Q T,Th,Sa-HD  . ferrous sulfate  325 mg Oral Daily  . heparin  5,000 Units Subcutaneous 3 times per day  . latanoprost  1 drop Both Eyes QHS  . pregabalin  75 mg Oral Daily  . senna  17.2 mg Oral QHS  . vancomycin  1,000 mg Intravenous Q T,Th,Sa-HD  . vancomycin  1,000 mg Intravenous STAT    Objective: Vital signs in last 24 hours: Temp:  [97.9 F (36.6 C)-98.3 F (36.8 C)] 97.9 F (36.6 C) (01/18 1338) Pulse Rate:  [106-135] 106 (01/18 1338) Resp:  [17] 17 (01/18 1338) BP: (112-137)/(56-74) 125/59 mmHg (01/18 1338) SpO2:  [88 %-95 %] 88 % (01/18 1338) Weight:  [92.9 kg (204 lb 12.9 oz)] 92.9 kg (204 lb 12.9 oz) (01/18 1338) Constitutional: He is oriented to person, place, and time. Appears chronically ill HENT: anicteric Mouth/Throat:  Oropharynx is clear and dry. No oropharyngeal exudate.  Cardiovascular: Normal rate, regular rhythm and normal heart sounds. Distant HD Pulmonary/Chest: Effort normal and breath sounds normal. No respiratory distress. He has no wheezes.  Abdominal: Soft. Bowel sounds are normal. He exhibits no distension. There is no tenderness.  Lymphadenopathy: He has no cervical adenopathy.  Neurological: He is alert and oriented to person, place, and time.  Skin: dry scaly skin  Psychiatric: He has a normal mood and affect. His behavior is normal.  L chest wall HD cath site covered- nontender R groin temp cath in place  Lab Results  Recent Labs  07/24/15 0505  K 4.1    Microbiology: Results for orders placed or performed during the hospital encounter of 07/22/15  Culture, blood (routine x 2)     Status: None (Preliminary result)   Collection Time: 07/22/15  6:21 PM  Result Value Ref Range Status   Specimen Description BLOOD LEFT HAND  Final   Special Requests BOTTLES DRAWN AEROBIC AND ANAEROBIC  4CC  Final   Culture  Setup Time   Final    GRAM POSITIVE COCCI IN CLUSTERS AEROBIC BOTTLE ONLY CRITICAL RESULT CALLED TO, READ BACK BY AND VERIFIED WITH: CRYSTAL [facility] 07/23/15 1330 MLM    Culture   Final  METHICILLIN RESISTANT STAPHYLOCOCCUS AUREUS AEROBIC BOTTLE ONLY    Report Status PENDING  Incomplete   Organism ID, Bacteria METHICILLIN RESISTANT STAPHYLOCOCCUS AUREUS  Final      Susceptibility   Methicillin resistant staphylococcus aureus - MIC*    CIPROFLOXACIN >=8 RESISTANT Resistant     ERYTHROMYCIN <=0.25 SENSITIVE Sensitive     GENTAMICIN <=0.5 SENSITIVE Sensitive     OXACILLIN >=4 RESISTANT Resistant     VANCOMYCIN 1 SENSITIVE Sensitive     TRIMETH/SULFA <=10 SENSITIVE Sensitive     CLINDAMYCIN <=0.25 SENSITIVE Sensitive     CEFOXITIN SCREEN Value in next row Resistant      POSITIVECEFOXITIN SCREEN - This test may be used to predict mecA-mediated oxacillin resistance, and  it is based on the cefoxitin disk screen test.  The cefoxitin screen and oxacillin work in combination to determine the final interpretation reported for oxacillin.     Inducible Clindamycin Value in next row Sensitive      POSITIVECEFOXITIN SCREEN - This test may be used to predict mecA-mediated oxacillin resistance, and it is based on the cefoxitin disk screen test.  The cefoxitin screen and oxacillin work in combination to determine the final interpretation reported for oxacillin.     TETRACYCLINE Value in next row Sensitive      SENSITIVE<=1    * METHICILLIN RESISTANT STAPHYLOCOCCUS AUREUS  Culture, blood (routine x 2)     Status: None (Preliminary result)   Collection Time: 07/22/15  6:21 PM  Result Value Ref Range Status   Specimen Description BLOOD LEFT HAND  Final   Special Requests   Final    BOTTLES DRAWN AEROBIC AND ANAEROBIC  AER 2CC ANA 4CC   Culture  Setup Time   Final    GRAM POSITIVE COCCI IN CLUSTERS IN BOTH AEROBIC AND ANAEROBIC BOTTLES CRITICAL RESULT CALLED TO, READ BACK BY AND VERIFIED WITH: CRYSTAL SCARPENA 07/23/15 1400 MLM    Culture METHICILLIN RESISTANT STAPHYLOCOCCUS AUREUS  Final   Report Status PENDING  Incomplete   Organism ID, Bacteria METHICILLIN RESISTANT STAPHYLOCOCCUS AUREUS  Final      Susceptibility   Methicillin resistant staphylococcus aureus - MIC*    CIPROFLOXACIN >=8 RESISTANT Resistant     ERYTHROMYCIN 0.5 SENSITIVE Sensitive     GENTAMICIN <=0.5 SENSITIVE Sensitive     OXACILLIN >=4 RESISTANT Resistant     VANCOMYCIN 1 SENSITIVE Sensitive     TRIMETH/SULFA <=10 SENSITIVE Sensitive     CLINDAMYCIN <=0.25 SENSITIVE Sensitive     CEFOXITIN SCREEN Value in next row Resistant      POSITIVECEFOXITIN SCREEN - This test may be used to predict mecA-mediated oxacillin resistance, and it is based on the cefoxitin disk screen test.  The cefoxitin screen and oxacillin work in combination to determine the final interpretation reported for oxacillin.      Inducible Clindamycin Value in next row Sensitive      POSITIVECEFOXITIN SCREEN - This test may be used to predict mecA-mediated oxacillin resistance, and it is based on the cefoxitin disk screen test.  The cefoxitin screen and oxacillin work in combination to determine the final interpretation reported for oxacillin.     TETRACYCLINE Value in next row Sensitive      SENSITIVE<=1    * METHICILLIN RESISTANT STAPHYLOCOCCUS AUREUS  Blood Culture ID Panel (Reflexed)     Status: Abnormal   Collection Time: 07/22/15  6:21 PM  Result Value Ref Range Status   Enterococcus species NOT DETECTED NOT DETECTED Final   Listeria monocytogenes  NOT DETECTED NOT DETECTED Final   Staphylococcus species DETECTED (A) NOT DETECTED Final   Staphylococcus aureus DETECTED (A) NOT DETECTED Final    Comment: CRITICAL RESULT CALLED TO, READ BACK BY AND VERIFIED WITH: CRYSTAL SCARPENA 07/23/15 1330 ABOUT STAPH SPECIES,STAPH AUREUS, AND MecA DETECTED MLM    Streptococcus species NOT DETECTED NOT DETECTED Final   Streptococcus agalactiae NOT DETECTED NOT DETECTED Final   Streptococcus pneumoniae NOT DETECTED NOT DETECTED Final   Streptococcus pyogenes NOT DETECTED NOT DETECTED Final   Acinetobacter baumannii NOT DETECTED NOT DETECTED Final   Enterobacteriaceae species NOT DETECTED NOT DETECTED Final   Enterobacter cloacae complex NOT DETECTED NOT DETECTED Final   Escherichia coli NOT DETECTED NOT DETECTED Final   Klebsiella oxytoca NOT DETECTED NOT DETECTED Final   Klebsiella pneumoniae NOT DETECTED NOT DETECTED Final   Proteus species NOT DETECTED NOT DETECTED Final   Serratia marcescens NOT DETECTED NOT DETECTED Final   Haemophilus influenzae NOT DETECTED NOT DETECTED Final   Neisseria meningitidis NOT DETECTED NOT DETECTED Final   Pseudomonas aeruginosa NOT DETECTED NOT DETECTED Final   Candida albicans NOT DETECTED NOT DETECTED Final   Candida glabrata NOT DETECTED NOT DETECTED Final   Candida krusei NOT  DETECTED NOT DETECTED Final   Candida parapsilosis NOT DETECTED NOT DETECTED Final   Candida tropicalis NOT DETECTED NOT DETECTED Final   Carbapenem resistance NOT DETECTED NOT DETECTED Final   Methicillin resistance DETECTED (A) NOT DETECTED Final   Vancomycin resistance NOT DETECTED NOT DETECTED Final  MRSA PCR Screening     Status: Abnormal   Collection Time: 07/23/15  5:24 AM  Result Value Ref Range Status   MRSA by PCR POSITIVE (A) NEGATIVE Final    Comment:        The GeneXpert MRSA Assay (FDA approved for NASAL specimens only), is one component of a comprehensive MRSA colonization surveillance program. It is not intended to diagnose MRSA infection nor to guide or monitor treatment for MRSA infections. CRITICAL RESULT CALLED TO, READ BACK BY AND VERIFIED WITH: ALISHA BABB ON 07/23/15 AT 0735 BY QSD     Studies/Results:  Dg Chest 1 View  07/22/2015  CLINICAL DATA:  Pt with end stage renal disease. Pt to ED via EMS from [facility] c/o dialysis catheter having been pulled out approximately 5-6 inches. Pt is verbally unresponsive except to basic commands. EXAM: CHEST  1 VIEW COMPARISON:  06/24/2015 FINDINGS: The tunneled left IJ hemodialysis catheter has its tips in the proximal right atrium. No pneumothorax. Confluent Opacities in the left mid and lower lung probably combination of effusion and adjacent parenchymal consolidation/ atelectasis. Right lung clear. Heart size difficult to assess due to adjacent opacities. No effusion. Atheromatous aorta. IMPRESSION: 1. Left IJ dialysis catheter projects in expected location. 2. Left lower lung consolidation/atelectasis with effusion. Electronically Signed   By: Corlis Leak M.D.   On: 07/22/2015 14:35   Dg Hip Port Unilat With Pelvis 1v Right  07/23/2015  CLINICAL DATA:  Pt is in renal stage failure. Unable to transport downstairs so to be done portable. Best images obtained due to condition. Previous MR right hip on 11-25. Pt  is complaining of pain and is unable to move in any way. EXAM: DG HIP (WITH OR WITHOUT PELVIS) 1V PORT RIGHT COMPARISON:  None. FINDINGS: Hips are located. No evidence of pelvic fracture or sacral fracture. Dedicated view of the RIGHT hip demonstrates no femoral neck fracture. IMPRESSION: No fracture or dislocation. Electronically Signed   By:  Genevive Bi M.D.   On: 07/23/2015 14:45    Assessment:  Brysyn Brandenberger. is a 82 y.o. male with ESRD on HD, admitted with lethargy and found to have MRSA bacteremia likely related to his neck line which had backed out some. He has LLL consolidation/atelectasis but per my discussion with him had no cough, sob.   Recommendations Cont vanco Check Echo Repeat bcx pending - if neg x 48 hrs can replace permacath Will need min 2 weeks IV vanco - can be given at HD Thank you very much for the consult. Will follow with you.  Tammy Wickliffe   07/26/2015, 2:13 PM

## 2015-07-26 NOTE — Progress Notes (Signed)
Speech Therapy Note: reviewed chart notes; pt remains on a dysphagia diet. Pt is currently off floor at Dialysis. ST will f/u w/ toleration of the diet next 1-2 days. Rec. Continue w/ current diet and aspiration precautions w/ feeding assistance at meals.

## 2015-07-26 NOTE — Progress Notes (Signed)
Pharmacy Antibiotic Follow-up Note  Sean Delgado. is a 82 y.o. year-old male admitted on 07/22/2015.  The patient is currently on day 5 of Vancomycin   for MRSA bacteremia and aspiration pneumonia.  Assessment/Plan: Patient received HD on 1/16 and today on 1/18.  No Vancomycin doses administered per MAR.  Will order Vancomycin 1 gm IV Stat to be given today after HD session.  Per nephrology notes, continue patient on Tues/Thurs/Sat schedule starting tomorrow (1/19). Trough will need to drawn prior to 3rd HD session on 1/21 (Saturday) unless schedule is changes.   Temp (24hrs), Avg:98.1 F (36.7 C), Min:97.9 F (36.6 C), Max:98.3 F (36.8 C)   Recent Labs Lab 07/22/15 1506 07/23/15 0451  WBC 12.9* 10.4     Recent Labs Lab 07/22/15 1506 07/22/15 1628 07/23/15 0451  CREATININE 5.53* 5.67* 5.83*   Estimated Creatinine Clearance: 11.6 mL/min (by C-G formula based on Cr of 5.83).    No Known Allergies  Antimicrobials this admission: Vanc 1/14 >> Unasyn 1/16 >>1/17 Zosyn 1/14 >> 1/16   Levels/dose changes this admission: Vancomycin 2 gm IV once with HD on 1/14 then 1 gm qHD session   Microbiology results: 1/14 BCx: Staph Aureus mecA gene positive  1/14 MRSA PCR: positive  Thank you for allowing pharmacy to be a part of this patient's care.  Alyviah Crandle G PharmD 07/26/2015 1:44 PM

## 2015-07-26 NOTE — Progress Notes (Addendum)
Patient ID: Sean Danzy., male   DOB: March 23, 1934, 82 y.o.   MRN: [id number] [facility] PROGRESS NOTE  PCP: Bobbye Morton, MD  HPI/Subjective: Patient has no complaints, s/p HD today.  Objective: Filed Vitals:   07/26/15 1300 07/26/15 1338  BP: 116/70 125/59  Pulse:  106  Temp:  97.9 F (36.6 C)  Resp:  17    Filed Weights   07/24/15 2031 07/25/15 0638 07/26/15 1338  Weight: 93.9 kg (207 lb 0.2 oz) 92.1 kg (203 lb 0.7 oz) 92.9 kg (204 lb 12.9 oz)    ROS: Review of Systems  Constitutional: Negative for fever and chills.  Eyes: Negative for blurred vision.  Respiratory: Negative for shortness of breath.   Cardiovascular: Negative for chest pain.  Gastrointestinal: Negative for nausea, vomiting, abdominal pain, diarrhea and constipation.  Genitourinary: Negative for dysuria.  Neurological: Negative for dizziness and headaches.   Exam: Physical Exam  HENT:  Nose: No mucosal edema.  Mouth/Throat: No oropharyngeal exudate or posterior oropharyngeal edema.  Eyes: Conjunctivae, EOM and lids are normal. Pupils are equal, round, and reactive to light.  Neck: No JVD present. Carotid bruit is not present. No edema present. No thyroid mass and no thyromegaly present.  Cardiovascular: S1 normal and S2 normal.  Exam reveals no gallop.   Murmur heard.  Systolic murmur is present with a grade of 2/6  Pulses:      Dorsalis pedis pulses are 2+ on the right side, and 2+ on the left side.  Respiratory: No respiratory distress. He has no wheezes. He has no rhonchi. He has no rales.  GI: Soft. Bowel sounds are normal. There is no tenderness.  Musculoskeletal:  Pain when flexing the right hip.  Lymphadenopathy:    He has no cervical adenopathy.  Neurological: He is alert.  Skin: Skin is warm. Nails show no clubbing.  Stage I decubitus as per nurse on the right buttock. Dry skin on the lower extremities.  Psychiatric: He has a normal mood and affect.      Data  Reviewed: Basic Metabolic Panel:  Recent Labs Lab 07/22/15 1506 07/22/15 1628 07/23/15 0451 07/24/15 0505 07/24/15 1712 07/26/15 0537  NA 135 138 137  --   --   --   K 2.3* 2.4* 2.7* 4.1  --   --   CL 98* 99* 100*  --   --   --   CO2 24 24 27   --   --   --   GLUCOSE 220* 163* 100*  --   --   --   BUN 38* 39* 42*  --   --   --   CREATININE 5.53* 5.67* 5.83*  --   --   --   CALCIUM 7.7* 7.9* 7.5*  --   --   --   MG  --   --  1.9 2.4  --   --   PHOS  --   --   --   --  7.0* 5.9*  CBC:  Recent Labs Lab 07/22/15 1506 07/23/15 0451  WBC 12.9* 10.4  NEUTROABS 10.9*  --   HGB 8.6* 8.9*  HCT 28.7* 29.5*  MCV 82.3 85.9  PLT 304 268    Recent Results (from the past 240 hour(s))  Culture, blood (routine x 2)     Status: None (Preliminary result)   Collection Time: 07/22/15  6:21 PM  Result Value Ref Range Status   Specimen Description BLOOD LEFT HAND  Final  Special Requests BOTTLES DRAWN AEROBIC AND ANAEROBIC  4CC  Final   Culture  Setup Time   Final    GRAM POSITIVE COCCI IN CLUSTERS AEROBIC BOTTLE ONLY CRITICAL RESULT CALLED TO, READ BACK BY AND VERIFIED WITH: CRYSTAL [facility] 07/23/15 1330 MLM    Culture   Final    METHICILLIN RESISTANT STAPHYLOCOCCUS AUREUS AEROBIC BOTTLE ONLY    Report Status PENDING  Incomplete   Organism ID, Bacteria METHICILLIN RESISTANT STAPHYLOCOCCUS AUREUS  Final      Susceptibility   Methicillin resistant staphylococcus aureus - MIC*    CIPROFLOXACIN >=8 RESISTANT Resistant     ERYTHROMYCIN <=0.25 SENSITIVE Sensitive     GENTAMICIN <=0.5 SENSITIVE Sensitive     OXACILLIN >=4 RESISTANT Resistant     VANCOMYCIN 1 SENSITIVE Sensitive     TRIMETH/SULFA <=10 SENSITIVE Sensitive     CLINDAMYCIN <=0.25 SENSITIVE Sensitive     CEFOXITIN SCREEN Value in next row Resistant      POSITIVECEFOXITIN SCREEN - This test may be used to predict mecA-mediated oxacillin resistance, and it is based on the cefoxitin disk screen test.  The cefoxitin screen and  oxacillin work in combination to determine the final interpretation reported for oxacillin.     Inducible Clindamycin Value in next row Sensitive      POSITIVECEFOXITIN SCREEN - This test may be used to predict mecA-mediated oxacillin resistance, and it is based on the cefoxitin disk screen test.  The cefoxitin screen and oxacillin work in combination to determine the final interpretation reported for oxacillin.     TETRACYCLINE Value in next row Sensitive      SENSITIVE<=1    * METHICILLIN RESISTANT STAPHYLOCOCCUS AUREUS  Culture, blood (routine x 2)     Status: None (Preliminary result)   Collection Time: 07/22/15  6:21 PM  Result Value Ref Range Status   Specimen Description BLOOD LEFT HAND  Final   Special Requests   Final    BOTTLES DRAWN AEROBIC AND ANAEROBIC  AER 2CC ANA 4CC   Culture  Setup Time   Final    GRAM POSITIVE COCCI IN CLUSTERS IN BOTH AEROBIC AND ANAEROBIC BOTTLES CRITICAL RESULT CALLED TO, READ BACK BY AND VERIFIED WITH: CRYSTAL SCARPENA 07/23/15 1400 MLM    Culture METHICILLIN RESISTANT STAPHYLOCOCCUS AUREUS  Final   Report Status PENDING  Incomplete   Organism ID, Bacteria METHICILLIN RESISTANT STAPHYLOCOCCUS AUREUS  Final      Susceptibility   Methicillin resistant staphylococcus aureus - MIC*    CIPROFLOXACIN >=8 RESISTANT Resistant     ERYTHROMYCIN 0.5 SENSITIVE Sensitive     GENTAMICIN <=0.5 SENSITIVE Sensitive     OXACILLIN >=4 RESISTANT Resistant     VANCOMYCIN 1 SENSITIVE Sensitive     TRIMETH/SULFA <=10 SENSITIVE Sensitive     CLINDAMYCIN <=0.25 SENSITIVE Sensitive     CEFOXITIN SCREEN Value in next row Resistant      POSITIVECEFOXITIN SCREEN - This test may be used to predict mecA-mediated oxacillin resistance, and it is based on the cefoxitin disk screen test.  The cefoxitin screen and oxacillin work in combination to determine the final interpretation reported for oxacillin.     Inducible Clindamycin Value in next row Sensitive      POSITIVECEFOXITIN  SCREEN - This test may be used to predict mecA-mediated oxacillin resistance, and it is based on the cefoxitin disk screen test.  The cefoxitin screen and oxacillin work in combination to determine the final interpretation reported for oxacillin.     TETRACYCLINE Value in  next row Sensitive      SENSITIVE<=1    * METHICILLIN RESISTANT STAPHYLOCOCCUS AUREUS  Blood Culture ID Panel (Reflexed)     Status: Abnormal   Collection Time: 07/22/15  6:21 PM  Result Value Ref Range Status   Enterococcus species NOT DETECTED NOT DETECTED Final   Listeria monocytogenes NOT DETECTED NOT DETECTED Final   Staphylococcus species DETECTED (A) NOT DETECTED Final   Staphylococcus aureus DETECTED (A) NOT DETECTED Final    Comment: CRITICAL RESULT CALLED TO, READ BACK BY AND VERIFIED WITH: CRYSTAL SCARPENA 07/23/15 1330 ABOUT STAPH SPECIES,STAPH AUREUS, AND MecA DETECTED MLM    Streptococcus species NOT DETECTED NOT DETECTED Final   Streptococcus agalactiae NOT DETECTED NOT DETECTED Final   Streptococcus pneumoniae NOT DETECTED NOT DETECTED Final   Streptococcus pyogenes NOT DETECTED NOT DETECTED Final   Acinetobacter baumannii NOT DETECTED NOT DETECTED Final   Enterobacteriaceae species NOT DETECTED NOT DETECTED Final   Enterobacter cloacae complex NOT DETECTED NOT DETECTED Final   Escherichia coli NOT DETECTED NOT DETECTED Final   Klebsiella oxytoca NOT DETECTED NOT DETECTED Final   Klebsiella pneumoniae NOT DETECTED NOT DETECTED Final   Proteus species NOT DETECTED NOT DETECTED Final   Serratia marcescens NOT DETECTED NOT DETECTED Final   Haemophilus influenzae NOT DETECTED NOT DETECTED Final   Neisseria meningitidis NOT DETECTED NOT DETECTED Final   Pseudomonas aeruginosa NOT DETECTED NOT DETECTED Final   Candida albicans NOT DETECTED NOT DETECTED Final   Candida glabrata NOT DETECTED NOT DETECTED Final   Candida krusei NOT DETECTED NOT DETECTED Final   Candida parapsilosis NOT DETECTED NOT DETECTED  Final   Candida tropicalis NOT DETECTED NOT DETECTED Final   Carbapenem resistance NOT DETECTED NOT DETECTED Final   Methicillin resistance DETECTED (A) NOT DETECTED Final   Vancomycin resistance NOT DETECTED NOT DETECTED Final  MRSA PCR Screening     Status: Abnormal   Collection Time: 07/23/15  5:24 AM  Result Value Ref Range Status   MRSA by PCR POSITIVE (A) NEGATIVE Final    Comment:        The GeneXpert MRSA Assay (FDA approved for NASAL specimens only), is one component of a comprehensive MRSA colonization surveillance program. It is not intended to diagnose MRSA infection nor to guide or monitor treatment for MRSA infections. CRITICAL RESULT CALLED TO, READ BACK BY AND VERIFIED WITH: ALISHA BABB ON 07/23/15 AT 0735 BY QSD      Studies: No results found.  Scheduled Meds: . allopurinol  100 mg Oral Daily  . aspirin EC  81 mg Oral Daily  . atorvastatin  20 mg Oral Daily  . clopidogrel  75 mg Oral Daily  . diltiazem  180 mg Oral Daily  . epoetin (EPOGEN/PROCRIT) injection  10,000 Units Intravenous Q T,Th,Sa-HD  . ferrous sulfate  325 mg Oral Daily  . heparin  5,000 Units Subcutaneous 3 times per day  . latanoprost  1 drop Both Eyes QHS  . pregabalin  75 mg Oral Daily  . senna  17.2 mg Oral QHS  . vancomycin  1,000 mg Intravenous Q T,Th,Sa-HD    Assessment/Plan:  1. Staph aureus sepsis with MRSA bacteremia, Likely source of fever is dialysis catheter left chest that was removed.  Continue vancomycin during HD for 2 weeks and discontinue Unasyn per Dr. Sampson Goon. If  Repeated blood cultures are negative, may get permacath per Dr. Sampson Goon.  F/u echo report.  He has LLL consolidation and atelectasis but no aspiration pneumonia. 2. Hypokalemia and  hypomagnesemia,  replaced, improved. 3. End-stage renal disease on dialysis Tuesday Thursday and Saturday. Permacath removed, on temporary catheter until sepsis clears.  need permacath again. 4. Hypotension, improved,  hold  his blood pressure medications 5. Peripheral vascular disease on aspirin and Plavix 6. Hyperlipidemia unspecified on atorvastatin 7. History of gout on allopurinol 8. Glaucoma unspecified continue latanoprost 9. Anemia of chronic disease Procrit with dialysis and continue iron supplementation  Code Status:     Code Status Orders        Start     Ordered   07/22/15 2038  Full code   Continuous     07/22/15 2038    Code Status History    Date Active Date Inactive Code Status Order ID Comments User Context   06/24/2015  4:07 PM 06/27/2015 12:37 AM Full Code [id number]  Auburn Bilberry, MD Inpatient   05/30/2015  5:22 PM 06/05/2015  6:42 PM Full Code [id number]  Trudee Kuster, RN Inpatient   05/30/2015  1:59 PM 05/30/2015  5:22 PM DNR [id number]  Shaune Pollack, MD Inpatient   05/29/2015  3:57 PM 05/29/2015  7:32 PM Full Code [id number]  Annice Needy, MD Inpatient   04/08/2015  8:31 PM 04/17/2015 11:42 PM Full Code [id number]  Gracelyn Nurse, MD Inpatient   03/04/2015  4:13 PM 03/11/2015  7:00 PM DNR [id number]  Houston Siren, MD Inpatient     I discussed with the patient daughter. Greater than 50% time was spent on coordination of care and face-to-face counseling. Disposition Plan: possible discharge back to SNF in 2 days. Discussed with Dr. Sampson Goon.  Consultants:  Nephrology  Vascular surgery  Antibiotics:  Vancomycin  Unasyn  Time spent: 42 minutes  Shaune Pollack  [facility]

## 2015-07-27 LAB — CULTURE, BLOOD (ROUTINE X 2)

## 2015-07-27 LAB — PHOSPHORUS: PHOSPHORUS: 4.5 mg/dL (ref 2.5–4.6)

## 2015-07-27 MED ORDER — CEFAZOLIN SODIUM 1-5 GM-% IV SOLN
1.0000 g | INTRAVENOUS | Status: AC
  Administered 2015-07-28: 1 g via INTRAVENOUS
  Filled 2015-07-27: qty 50

## 2015-07-27 MED ORDER — VANCOMYCIN HCL IN DEXTROSE 1-5 GM/200ML-% IV SOLN
1000.0000 mg | Freq: Once | INTRAVENOUS | Status: AC
  Administered 2015-07-27: 1000 mg via INTRAVENOUS
  Filled 2015-07-27: qty 200

## 2015-07-27 NOTE — Progress Notes (Signed)
Hemodialysis start 

## 2015-07-27 NOTE — Progress Notes (Signed)
Subjective:  Pt seen during dialysis.  BFR 400 at the moment.  UF target 1.5kg.  Blood cultures from 1/17 negative.     Objective:  Vital signs in last 24 hours:  Temp:  [97.7 F (36.5 C)-100.6 F (38.1 C)] 97.9 F (36.6 C) (01/19 1215) Pulse Rate:  [106-146] 130 (01/19 1230) Resp:  [17-24] 18 (01/19 1230) BP: (97-137)/(41-60) 137/57 mmHg (01/19 1230) SpO2:  [88 %-98 %] 90 % (01/19 1230) Weight:  [92.3 kg (203 lb 7.8 oz)-92.9 kg (204 lb 12.9 oz)] 92.3 kg (203 lb 7.8 oz) (01/19 1215)  Weight change:  Filed Weights   07/25/15 0638 07/26/15 1338 07/27/15 1215  Weight: 92.1 kg (203 lb 0.7 oz) 92.9 kg (204 lb 12.9 oz) 92.3 kg (203 lb 7.8 oz)    Intake/Output:    Intake/Output Summary (Last 24 hours) at 07/27/15 1304 Last data filed at 07/27/15 0100  Gross per 24 hour  Intake    480 ml  Output  -1000 ml  Net   1480 ml     Physical Exam: General:  no acute distress, laying in the bed   HEENT  anicteric, moist oral mucous membranes   Neck  supple   Pulm/lungs  clear to auscultation bilateral, normal effort  CVS/Heart  S1S2 no rubs  Abdomen:   soft, nontender, nondistended   Extremities:  no peripheral edema   Neurologic:  alert, oriented   Skin:  no acute rashes   Access:  temporary right femoral dialysis catheter        Basic Metabolic Panel:   Recent Labs Lab 07/22/15 1506 07/22/15 1628 07/23/15 0451 07/24/15 0505 07/24/15 1712 07/26/15 0537  NA 135 138 137  --   --   --   K 2.3* 2.4* 2.7* 4.1  --   --   CL 98* 99* 100*  --   --   --   CO2 --   --   --   GLUCOSE 220* 163* 100*  --   --   --   BUN 38* 39* 42*  --   --   --   CREATININE 5.53* 5.67* 5.83*  --   --   --   CALCIUM 7.7* 7.9* 7.5*  --   --   --   MG  --   --  1.9 2.4  --   --   PHOS  --   --   --   --  7.0* 5.9*     CBC:  Recent Labs Lab 07/22/15 1506 07/23/15 0451  WBC 12.9* 10.4  NEUTROABS 10.9*  --   HGB 8.6* 8.9*  HCT 28.7* 29.5*  MCV 82.3 85.9  PLT 304 268       Microbiology:  Recent Results (from the past 720 hour(s))  Culture, blood (routine x 2)     Status: None   Collection Time: 07/22/15  6:21 PM  Result Value Ref Range Status   Specimen Description BLOOD LEFT HAND  Final   Special Requests BOTTLES DRAWN AEROBIC AND ANAEROBIC  4CC  Final   Culture  Setup Time   Final    GRAM POSITIVE COCCI IN CLUSTERS AEROBIC BOTTLE ONLY CRITICAL RESULT CALLED TO, READ BACK BY AND VERIFIED WITH: CRYSTAL [facility] 07/23/15 1330 MLM    Culture   Final    METHICILLIN RESISTANT STAPHYLOCOCCUS AUREUS AEROBIC BOTTLE ONLY    Report Status 07/27/2015 FINAL  Final   Organism ID, Bacteria METHICILLIN RESISTANT STAPHYLOCOCCUS AUREUS  Final      Susceptibility   Methicillin resistant staphylococcus aureus - MIC*    CIPROFLOXACIN >=8 RESISTANT Resistant     ERYTHROMYCIN <=0.25 SENSITIVE Sensitive     GENTAMICIN <=0.5 SENSITIVE Sensitive     OXACILLIN >=4 RESISTANT Resistant     VANCOMYCIN 1 SENSITIVE Sensitive     TRIMETH/SULFA <=10 SENSITIVE Sensitive     CLINDAMYCIN <=0.25 SENSITIVE Sensitive     CEFOXITIN SCREEN Value in next row Resistant      POSITIVECEFOXITIN SCREEN - This test may be used to predict mecA-mediated oxacillin resistance, and it is based on the cefoxitin disk screen test.  The cefoxitin screen and oxacillin work in combination to determine the final interpretation reported for oxacillin.     Inducible Clindamycin Value in next row Sensitive      POSITIVECEFOXITIN SCREEN - This test may be used to predict mecA-mediated oxacillin resistance, and it is based on the cefoxitin disk screen test.  The cefoxitin screen and oxacillin work in combination to determine the final interpretation reported for oxacillin.     TETRACYCLINE Value in next row Sensitive      SENSITIVE<=1    * METHICILLIN RESISTANT STAPHYLOCOCCUS AUREUS  Culture, blood (routine x 2)     Status: None   Collection Time: 07/22/15  6:21 PM  Result Value Ref Range Status    Specimen Description BLOOD LEFT HAND  Final   Special Requests   Final    BOTTLES DRAWN AEROBIC AND ANAEROBIC  AER 2CC ANA 4CC   Culture  Setup Time   Final    GRAM POSITIVE COCCI IN CLUSTERS IN BOTH AEROBIC AND ANAEROBIC BOTTLES CRITICAL RESULT CALLED TO, READ BACK BY AND VERIFIED WITH: CRYSTAL SCARPENA 07/23/15 1400 MLM    Culture METHICILLIN RESISTANT STAPHYLOCOCCUS AUREUS  Final   Report Status 07/27/2015 FINAL  Final   Organism ID, Bacteria METHICILLIN RESISTANT STAPHYLOCOCCUS AUREUS  Final      Susceptibility   Methicillin resistant staphylococcus aureus - MIC*    CIPROFLOXACIN >=8 RESISTANT Resistant     ERYTHROMYCIN 0.5 SENSITIVE Sensitive     GENTAMICIN <=0.5 SENSITIVE Sensitive     OXACILLIN >=4 RESISTANT Resistant     VANCOMYCIN 1 SENSITIVE Sensitive     TRIMETH/SULFA <=10 SENSITIVE Sensitive     CLINDAMYCIN <=0.25 SENSITIVE Sensitive     CEFOXITIN SCREEN Value in next row Resistant      POSITIVECEFOXITIN SCREEN - This test may be used to predict mecA-mediated oxacillin resistance, and it is based on the cefoxitin disk screen test.  The cefoxitin screen and oxacillin work in combination to determine the final interpretation reported for oxacillin.     Inducible Clindamycin Value in next row Sensitive      POSITIVECEFOXITIN SCREEN - This test may be used to predict mecA-mediated oxacillin resistance, and it is based on the cefoxitin disk screen test.  The cefoxitin screen and oxacillin work in combination to determine the final interpretation reported for oxacillin.     TETRACYCLINE Value in next row Sensitive      SENSITIVE<=1    * METHICILLIN RESISTANT STAPHYLOCOCCUS AUREUS  Blood Culture ID Panel (Reflexed)     Status: Abnormal   Collection Time: 07/22/15  6:21 PM  Result Value Ref Range Status   Enterococcus species NOT DETECTED NOT DETECTED Final   Listeria monocytogenes NOT DETECTED NOT DETECTED Final   Staphylococcus species DETECTED (A) NOT DETECTED Final    Staphylococcus aureus DETECTED (A) NOT DETECTED Final  Comment: CRITICAL RESULT CALLED TO, READ BACK BY AND VERIFIED WITH: CRYSTAL SCARPENA 07/23/15 1330 ABOUT STAPH SPECIES,STAPH AUREUS, AND MecA DETECTED MLM    Streptococcus species NOT DETECTED NOT DETECTED Final   Streptococcus agalactiae NOT DETECTED NOT DETECTED Final   Streptococcus pneumoniae NOT DETECTED NOT DETECTED Final   Streptococcus pyogenes NOT DETECTED NOT DETECTED Final   Acinetobacter baumannii NOT DETECTED NOT DETECTED Final   Enterobacteriaceae species NOT DETECTED NOT DETECTED Final   Enterobacter cloacae complex NOT DETECTED NOT DETECTED Final   Escherichia coli NOT DETECTED NOT DETECTED Final   Klebsiella oxytoca NOT DETECTED NOT DETECTED Final   Klebsiella pneumoniae NOT DETECTED NOT DETECTED Final   Proteus species NOT DETECTED NOT DETECTED Final   Serratia marcescens NOT DETECTED NOT DETECTED Final   Haemophilus influenzae NOT DETECTED NOT DETECTED Final   Neisseria meningitidis NOT DETECTED NOT DETECTED Final   Pseudomonas aeruginosa NOT DETECTED NOT DETECTED Final   Candida albicans NOT DETECTED NOT DETECTED Final   Candida glabrata NOT DETECTED NOT DETECTED Final   Candida krusei NOT DETECTED NOT DETECTED Final   Candida parapsilosis NOT DETECTED NOT DETECTED Final   Candida tropicalis NOT DETECTED NOT DETECTED Final   Carbapenem resistance NOT DETECTED NOT DETECTED Final   Methicillin resistance DETECTED (A) NOT DETECTED Final   Vancomycin resistance NOT DETECTED NOT DETECTED Final  MRSA PCR Screening     Status: Abnormal   Collection Time: 07/23/15  5:24 AM  Result Value Ref Range Status   MRSA by PCR POSITIVE (A) NEGATIVE Final    Comment:        The GeneXpert MRSA Assay (FDA approved for NASAL specimens only), is one component of a comprehensive MRSA colonization surveillance program. It is not intended to diagnose MRSA infection nor to guide or monitor treatment for MRSA  infections. CRITICAL RESULT CALLED TO, READ BACK BY AND VERIFIED WITH: ALISHA BABB ON 07/23/15 AT 0735 BY QSD   CULTURE, BLOOD (ROUTINE X 2) w Reflex to PCR ID Panel     Status: None (Preliminary result)   Collection Time: 07/25/15  6:08 AM  Result Value Ref Range Status   Specimen Description BLOOD LEFT AC  Final   Special Requests BOTTLES DRAWN AEROBIC AND ANAEROBIC ANA2ML AER3ML  Final   Culture NO GROWTH 2 DAYS  Final   Report Status PENDING  Incomplete  CULTURE, BLOOD (ROUTINE X 2) w Reflex to PCR ID Panel     Status: None (Preliminary result)   Collection Time: 07/25/15  6:08 AM  Result Value Ref Range Status   Specimen Description BLOOD LEFT WRIST  Final   Special Requests   Final    BOTTLES DRAWN AEROBIC AND ANAEROBIC ANA AER   Culture NO GROWTH 2 DAYS  Final   Report Status PENDING  Incomplete    Coagulation Studies: No results for input(s): LABPROT, INR in the last 72 hours.  Urinalysis: No results for input(s): COLORURINE, LABSPEC, PHURINE, GLUCOSEU, HGBUR, BILIRUBINUR, KETONESUR, PROTEINUR, UROBILINOGEN, NITRITE, LEUKOCYTESUR in the last 72 hours.  Invalid input(s): APPERANCEUR    Imaging: No results found.   Medications:     . allopurinol  100 mg Oral Daily  . aspirin EC  81 mg Oral Daily  . atorvastatin  20 mg Oral Daily  . clopidogrel  75 mg Oral Daily  . diltiazem  180 mg Oral Daily  . epoetin (EPOGEN/PROCRIT) injection  10,000 Units Intravenous Q T,Th,Sa-HD  . ferrous sulfate  325 mg Oral Daily  .  heparin  5,000 Units Subcutaneous 3 times per day  . latanoprost  1 drop Both Eyes QHS  . pregabalin  75 mg Oral Daily  . senna  17.2 mg Oral QHS  . vancomycin  1,000 mg Intravenous Q T,Th,Sa-HD   sodium chloride, sodium chloride, alteplase, benzonatate, heparin, lidocaine (PF), lidocaine-prilocaine, naLOXone (NARCAN)  injection, oxyCODONE-acetaminophen, pentafluoroprop-tetrafluoroeth  Assessment/ Plan:  82 y.o. african [place] male  with right  eye blindness, diastolic congestive heart failure, hypertension, gout, glaucoma, peripheral neuropathy, atrial fibrillation, anemia, carotid stenosis status post bilateral CEA, CVA, peripheral vascular disease  [facility]/ TTS  1. End Stage Renal Disease/MRSA sepsis:  IJ perncath appears to have completely dislodged.  -Pt seen during HD, tolerating well, BFR 400 at the present.  UF target 1.5kg.  2. Anemia of chronic kidney disease: Hemoglobin 8.9 - continue Epogen 10,000 units IV with dialysis  3. Secondary Hyperparathyroidism:  - phos down to 5.9 at present, continue to monitor.  4. Hypokalemia -resolved at the moment.  Most recent potassium 4.1   LOS: 5 Carmie Lanpher 1/19/20171:04 PM

## 2015-07-27 NOTE — Progress Notes (Signed)
Patient ID: Sean Delgado., male   DOB: Nov 12, 1933, 82 y.o.   MRN: [id number] [facility] PROGRESS NOTE  PCP: Bobbye Morton, MD  HPI/Subjective: Patient has no complaints, on HD today.  Objective: Filed Vitals:   07/27/15 1556 07/27/15 1627  BP: 115/70 115/64  Pulse: 128 125  Temp: 97.7 F (36.5 C) 98.5 F (36.9 C)  Resp: 18 18    Filed Weights   07/25/15 0638 07/26/15 1338 07/27/15 1215  Weight: 92.1 kg (203 lb 0.7 oz) 92.9 kg (204 lb 12.9 oz) 92.3 kg (203 lb 7.8 oz)    ROS: Review of Systems  Constitutional: Negative for fever and chills.  Eyes: Negative for blurred vision.  Respiratory: Negative for shortness of breath.   Cardiovascular: Negative for chest pain.  Gastrointestinal: Negative for nausea, vomiting, abdominal pain, diarrhea and constipation.  Genitourinary: Negative for dysuria.  Neurological: Negative for dizziness and headaches.   Exam: Physical Exam  HENT:  Nose: No mucosal edema.  Mouth/Throat: No oropharyngeal exudate or posterior oropharyngeal edema.  Eyes: Conjunctivae, EOM and lids are normal. Pupils are equal, round, and reactive to light.  Neck: No JVD present. Carotid bruit is not present. No edema present. No thyroid mass and no thyromegaly present.  Cardiovascular: S1 normal and S2 normal.  Exam reveals no gallop.   Murmur heard.  Systolic murmur is present with a grade of 2/6  Pulses:      Dorsalis pedis pulses are 2+ on the right side, and 2+ on the left side.  Respiratory: No respiratory distress. He has no wheezes. He has no rhonchi. He has no rales.  GI: Soft. Bowel sounds are normal. There is no tenderness.  Musculoskeletal:  Pain when flexing the right hip.  Lymphadenopathy:    He has no cervical adenopathy.  Neurological: He is alert.  Skin: Skin is warm. Nails show no clubbing.  Stage I decubitus as per nurse on the right buttock. Dry skin on the lower extremities.  Psychiatric: He has a normal mood and  affect.      Data Reviewed: Basic Metabolic Panel:  Recent Labs Lab 07/22/15 1506 07/22/15 1628 07/23/15 0451 07/24/15 0505 07/24/15 1712 07/26/15 0537 07/27/15 1247  NA 135 138 137  --   --   --   --   K 2.3* 2.4* 2.7* 4.1  --   --   --   CL 98* 99* 100*  --   --   --   --   CO2 --   --   --   --   GLUCOSE 220* 163* 100*  --   --   --   --   BUN 38* 39* 42*  --   --   --   --   CREATININE 5.53* 5.67* 5.83*  --   --   --   --   CALCIUM 7.7* 7.9* 7.5*  --   --   --   --   MG  --   --  1.9 2.4  --   --   --   PHOS  --   --   --   --  7.0* 5.9* 4.5  CBC:  Recent Labs Lab 07/22/15 1506 07/23/15 0451  WBC 12.9* 10.4  NEUTROABS 10.9*  --   HGB 8.6* 8.9*  HCT 28.7* 29.5*  MCV 82.3 85.9  PLT 304 268    Recent Results (from the past 240 hour(s))  Culture, blood (routine x  2)     Status: None   Collection Time: 07/22/15  6:21 PM  Result Value Ref Range Status   Specimen Description BLOOD LEFT HAND  Final   Special Requests BOTTLES DRAWN AEROBIC AND ANAEROBIC  4CC  Final   Culture  Setup Time   Final    GRAM POSITIVE COCCI IN CLUSTERS AEROBIC BOTTLE ONLY CRITICAL RESULT CALLED TO, READ BACK BY AND VERIFIED WITH: CRYSTAL SCARPENA 07/23/15 1330 MLM    Culture   Final    METHICILLIN RESISTANT STAPHYLOCOCCUS AUREUS AEROBIC BOTTLE ONLY    Report Status 07/27/2015 FINAL  Final   Organism ID, Bacteria METHICILLIN RESISTANT STAPHYLOCOCCUS AUREUS  Final      Susceptibility   Methicillin resistant staphylococcus aureus - MIC*    CIPROFLOXACIN >=8 RESISTANT Resistant     ERYTHROMYCIN <=0.25 SENSITIVE Sensitive     GENTAMICIN <=0.5 SENSITIVE Sensitive     OXACILLIN >=4 RESISTANT Resistant     VANCOMYCIN 1 SENSITIVE Sensitive     TRIMETH/SULFA <=10 SENSITIVE Sensitive     CLINDAMYCIN <=0.25 SENSITIVE Sensitive     CEFOXITIN SCREEN Value in next row Resistant      POSITIVECEFOXITIN SCREEN - This test may be used to predict mecA-mediated oxacillin resistance, and it  is based on the cefoxitin disk screen test.  The cefoxitin screen and oxacillin work in combination to determine the final interpretation reported for oxacillin.     Inducible Clindamycin Value in next row Sensitive      POSITIVECEFOXITIN SCREEN - This test may be used to predict mecA-mediated oxacillin resistance, and it is based on the cefoxitin disk screen test.  The cefoxitin screen and oxacillin work in combination to determine the final interpretation reported for oxacillin.     TETRACYCLINE Value in next row Sensitive      SENSITIVE<=1    * METHICILLIN RESISTANT STAPHYLOCOCCUS AUREUS  Culture, blood (routine x 2)     Status: None   Collection Time: 07/22/15  6:21 PM  Result Value Ref Range Status   Specimen Description BLOOD LEFT HAND  Final   Special Requests   Final    BOTTLES DRAWN AEROBIC AND ANAEROBIC  AER 2CC ANA 4CC   Culture  Setup Time   Final    GRAM POSITIVE COCCI IN CLUSTERS IN BOTH AEROBIC AND ANAEROBIC BOTTLES CRITICAL RESULT CALLED TO, READ BACK BY AND VERIFIED WITH: CRYSTAL SCARPENA 07/23/15 1400 MLM    Culture METHICILLIN RESISTANT STAPHYLOCOCCUS AUREUS  Final   Report Status 07/27/2015 FINAL  Final   Organism ID, Bacteria METHICILLIN RESISTANT STAPHYLOCOCCUS AUREUS  Final      Susceptibility   Methicillin resistant staphylococcus aureus - MIC*    CIPROFLOXACIN >=8 RESISTANT Resistant     ERYTHROMYCIN 0.5 SENSITIVE Sensitive     GENTAMICIN <=0.5 SENSITIVE Sensitive     OXACILLIN >=4 RESISTANT Resistant     VANCOMYCIN 1 SENSITIVE Sensitive     TRIMETH/SULFA <=10 SENSITIVE Sensitive     CLINDAMYCIN <=0.25 SENSITIVE Sensitive     CEFOXITIN SCREEN Value in next row Resistant      POSITIVECEFOXITIN SCREEN - This test may be used to predict mecA-mediated oxacillin resistance, and it is based on the cefoxitin disk screen test.  The cefoxitin screen and oxacillin work in combination to determine the final interpretation reported for oxacillin.     Inducible Clindamycin  Value in next row Sensitive      POSITIVECEFOXITIN SCREEN - This test may be used to predict mecA-mediated oxacillin resistance, and it  is based on the cefoxitin disk screen test.  The cefoxitin screen and oxacillin work in combination to determine the final interpretation reported for oxacillin.     TETRACYCLINE Value in next row Sensitive      SENSITIVE<=1    * METHICILLIN RESISTANT STAPHYLOCOCCUS AUREUS  Blood Culture ID Panel (Reflexed)     Status: Abnormal   Collection Time: 07/22/15  6:21 PM  Result Value Ref Range Status   Enterococcus species NOT DETECTED NOT DETECTED Final   Listeria monocytogenes NOT DETECTED NOT DETECTED Final   Staphylococcus species DETECTED (A) NOT DETECTED Final   Staphylococcus aureus DETECTED (A) NOT DETECTED Final    Comment: CRITICAL RESULT CALLED TO, READ BACK BY AND VERIFIED WITH: CRYSTAL SCARPENA 07/23/15 1330 ABOUT STAPH SPECIES,STAPH AUREUS, AND MecA DETECTED MLM    Streptococcus species NOT DETECTED NOT DETECTED Final   Streptococcus agalactiae NOT DETECTED NOT DETECTED Final   Streptococcus pneumoniae NOT DETECTED NOT DETECTED Final   Streptococcus pyogenes NOT DETECTED NOT DETECTED Final   Acinetobacter baumannii NOT DETECTED NOT DETECTED Final   Enterobacteriaceae species NOT DETECTED NOT DETECTED Final   Enterobacter cloacae complex NOT DETECTED NOT DETECTED Final   Escherichia coli NOT DETECTED NOT DETECTED Final   Klebsiella oxytoca NOT DETECTED NOT DETECTED Final   Klebsiella pneumoniae NOT DETECTED NOT DETECTED Final   Proteus species NOT DETECTED NOT DETECTED Final   Serratia marcescens NOT DETECTED NOT DETECTED Final   Haemophilus influenzae NOT DETECTED NOT DETECTED Final   Neisseria meningitidis NOT DETECTED NOT DETECTED Final   Pseudomonas aeruginosa NOT DETECTED NOT DETECTED Final   Candida albicans NOT DETECTED NOT DETECTED Final   Candida glabrata NOT DETECTED NOT DETECTED Final   Candida krusei NOT DETECTED NOT DETECTED Final    Candida parapsilosis NOT DETECTED NOT DETECTED Final   Candida tropicalis NOT DETECTED NOT DETECTED Final   Carbapenem resistance NOT DETECTED NOT DETECTED Final   Methicillin resistance DETECTED (A) NOT DETECTED Final   Vancomycin resistance NOT DETECTED NOT DETECTED Final  MRSA PCR Screening     Status: Abnormal   Collection Time: 07/23/15  5:24 AM  Result Value Ref Range Status   MRSA by PCR POSITIVE (A) NEGATIVE Final    Comment:        The GeneXpert MRSA Assay (FDA approved for NASAL specimens only), is one component of a comprehensive MRSA colonization surveillance program. It is not intended to diagnose MRSA infection nor to guide or monitor treatment for MRSA infections. CRITICAL RESULT CALLED TO, READ BACK BY AND VERIFIED WITH: ALISHA BABB ON 07/23/15 AT 0735 BY QSD   CULTURE, BLOOD (ROUTINE X 2) w Reflex to PCR ID Panel     Status: None (Preliminary result)   Collection Time: 07/25/15  6:08 AM  Result Value Ref Range Status   Specimen Description BLOOD LEFT AC  Final   Special Requests BOTTLES DRAWN AEROBIC AND ANAEROBIC ANA2ML AER3ML  Final   Culture NO GROWTH 2 DAYS  Final   Report Status PENDING  Incomplete  CULTURE, BLOOD (ROUTINE X 2) w Reflex to PCR ID Panel     Status: None (Preliminary result)   Collection Time: 07/25/15  6:08 AM  Result Value Ref Range Status   Specimen Description BLOOD LEFT WRIST  Final   Special Requests   Final    BOTTLES DRAWN AEROBIC AND ANAEROBIC ANA AER   Culture NO GROWTH 2 DAYS  Final   Report Status PENDING  Incomplete  Studies: No results found.  Scheduled Meds: . allopurinol  100 mg Oral Daily  . aspirin EC  81 mg Oral Daily  . atorvastatin  20 mg Oral Daily  . [START ON 07/28/2015]  ceFAZolin (ANCEF) IV  1 g Intravenous On Call  . clopidogrel  75 mg Oral Daily  . diltiazem  180 mg Oral Daily  . epoetin (EPOGEN/PROCRIT) injection  10,000 Units Intravenous Q T,Th,Sa-HD  . ferrous sulfate  325 mg Oral Daily   . heparin  5,000 Units Subcutaneous 3 times per day  . latanoprost  1 drop Both Eyes QHS  . pregabalin  75 mg Oral Daily  . senna  17.2 mg Oral QHS  . vancomycin  1,000 mg Intravenous Q T,Th,Sa-HD  . vancomycin  1,000 mg Intravenous Once    Assessment/Plan:  1. Staph aureus sepsis with MRSA bacteremia, Likely source of fever is dialysis catheter left chest that was removed.  Continue vancomycin during HD for 2 weeks and discontinue Unasyn per Dr. Sampson Goon. If  Repeated blood cultures are negative for 2 days, may get permacath per Dr. Sampson Goon.  Echo did not report any vegetation. Perma cath placement tomorrow per Dr. Gilda Crease.  He has LLL consolidation and atelectasis but no aspiration pneumonia. 2. Hypokalemia and hypomagnesemia,  replaced, improved. 3. End-stage renal disease on dialysis Tuesday Thursday and Saturday. Permacath removed, on temporary catheter until sepsis clears.   permacath tomorrow. 4. Hypotension, improved,  hold his blood pressure medications 5. Peripheral vascular disease on aspirin and Plavix 6. Hyperlipidemia unspecified on atorvastatin 7. History of gout on allopurinol 8. Glaucoma unspecified continue latanoprost 9. Anemia of chronic disease Procrit with dialysis and continue iron supplementation  Code Status:     Code Status Orders        Start     Ordered   07/22/15 2038  Full code   Continuous     07/22/15 2038    Code Status History    Date Active Date Inactive Code Status Order ID Comments User Context   06/24/2015  4:07 PM 06/27/2015 12:37 AM Full Code [id number]  Auburn Bilberry, MD Inpatient   05/30/2015  5:22 PM 06/05/2015  6:42 PM Full Code [id number]  Trudee Kuster, RN Inpatient   05/30/2015  1:59 PM 05/30/2015  5:22 PM DNR [id number]  Shaune Pollack, MD Inpatient   05/29/2015  3:57 PM 05/29/2015  7:32 PM Full Code [id number]  Annice Needy, MD Inpatient   04/08/2015  8:31 PM 04/17/2015 11:42 PM Full Code [id number]  Gracelyn Nurse, MD  Inpatient   03/04/2015  4:13 PM 03/11/2015  7:00 PM DNR [id number]  Houston Siren, MD Inpatient     I discussed with the patient daughter. Greater than 50% time was spent on coordination of care and face-to-face counseling. Disposition Plan: possible discharge back to SNF tomorrow. Discussed with Dr. Gilda Crease.  Consultants:  Nephrology  Vascular surgery  Antibiotics:  Vancomycin  Unasyn  Time spent: 42 minutes  Shaune Pollack  [facility]

## 2015-07-27 NOTE — Clinical Documentation Improvement (Signed)
Internal Medicine at [facility]  (Query responses must be documented in the progress notes and discharge summary, not on the CDI BPA.)  Please clarify if the diagnosis of "Acute Respiratory Failure" documented for the first time by Dr. Sampson Goon in the ID consult dated 07/25/15, IS or IS NOT applicable to this admission.  Please include any baseline, historical and/or comparative information to support the diagnosis.   Please exercise your independent, professional judgment when responding. A specific answer is not anticipated or expected.   Thank You, Jerral Ralph  RN BSN CCDS [phone] Health Information Management [place]

## 2015-07-27 NOTE — Progress Notes (Signed)
Pre-hd tx 

## 2015-07-27 NOTE — Care Management Note (Signed)
Patient is active at [facility] [address] [facility].  Admission records have been sent to the center and I will follow up with additional records at discharge.  I was told during Length of Stay review today that patient will have orders for antibiotics at discharge to be given during his dialysis treatments.  Ivor Reining  Dialysis Liaison  [phone]

## 2015-07-27 NOTE — Progress Notes (Signed)
Pharmacy Antibiotic Follow-up Note  Sean Delgado. is a 82 y.o. year-old male admitted on 07/22/2015.  The patient is currently on day 6 of Vancomycin   for MRSA bacteremia and aspiration pneumonia.  Assessment/Plan: Patient received HD on 1/16, 1/18 and today on 1/19.  No Vancomycin doses administered per MAR.  Spoke with HD RN and no Vancomycin was given today after HD.  Will order Vancomycin 1 gm IV once to be given as supplemental dose after HD. Trough prior to HD on 1/21.   Temp (24hrs), Avg:98.8 F (37.1 C), Min:97.7 F (36.5 C), Max:100.6 F (38.1 C)   Recent Labs Lab 07/22/15 1506 07/23/15 0451  WBC 12.9* 10.4     Recent Labs Lab 07/22/15 1506 07/22/15 1628 07/23/15 0451  CREATININE 5.53* 5.67* 5.83*   Estimated Creatinine Clearance: 11.6 mL/min (by C-G formula based on Cr of 5.83).    No Known Allergies  Antimicrobials this admission: Vanc 1/14 >> Unasyn 1/16 >>1/17 Zosyn 1/14 >> 1/16   Levels/dose changes this admission: Vancomycin 2 gm IV once with HD on 1/14 then 1 gm qHD session.  1 gm Vancomycin given after HD on 1/18   Microbiology results: 1/14 BCx: Staph Aureus mecA gene positive  1/14 MRSA PCR: positive  Thank you for allowing pharmacy to be a part of this patient's care.  Treesa Mccully G PharmD 07/27/2015 4:20 PM

## 2015-07-27 NOTE — Progress Notes (Signed)
[facility]  Daily Progress Note   Subjective  -  Patient denies pain this evening. Blood cultures are negative and Dr. Sampson Goon has cleared the patient for placement of a catheter  Objective Filed Vitals:   07/27/15 1530 07/27/15 1550 07/27/15 1556 07/27/15 1627  BP: 116/53 90/68 115/70 115/64  Pulse: 154 149 128 125  Temp:   97.7 F (36.5 C) 98.5 F (36.9 C)  TempSrc:   Oral Oral  Resp: Height:      Weight:      SpO2: 99% 98%  89%    Intake/Output Summary (Last 24 hours) at 07/27/15 1927 Last data filed at 07/27/15 1556  Gross per 24 hour  Intake      0 ml  Output   1500 ml  Net  -1500 ml    PULM  Normal effort , no use of accessory muscles CV  No JVD, RRR Abd      No distended, nontender VASC  left tunneled catheter site clean dry and intact no evidence of residual purulence or infection  Laboratory CBC    Component Value Date/Time   WBC 10.4 07/23/2015 0451   HGB 8.9* 07/23/2015 0451   HCT 29.5* 07/23/2015 0451   PLT 268 07/23/2015 0451    BMET    Component Value Date/Time   NA 137 07/23/2015 0451   K 4.1 07/24/2015 0505   CL 100* 07/23/2015 0451   CO2 27 07/23/2015 0451   GLUCOSE 100* 07/23/2015 0451   BUN 42* 07/23/2015 0451   CREATININE 5.83* 07/23/2015 0451   CALCIUM 7.5* 07/23/2015 0451   GFRNONAA 8* 07/23/2015 0451   GFRAA 10* 07/23/2015 0451    Assessment/Planning:   Complication of dialysis device with infected tunneled catheter: The total catheter is been removed and blood cultures are now negative. I will therefore plan for tunneled catheter placement tomorrow.    Dainel Arcidiacono, Latina Craver  07/27/2015, 7:27 PM

## 2015-07-28 ENCOUNTER — Inpatient Hospital Stay: Payer: Medicare (Managed Care)

## 2015-07-28 ENCOUNTER — Encounter
Admission: EM | Disposition: A | Discharge status: Skilled Nursing Facility | Payer: Self-pay | Source: Home / Self Care | Attending: Internal Medicine

## 2015-07-28 DIAGNOSIS — I9589 Other hypotension: Secondary | ICD-10-CM

## 2015-07-28 LAB — CBC
HCT: 27.6 % — ABNORMAL LOW (ref 40.0–52.0)
HEMOGLOBIN: 8.2 g/dL — AB (ref 13.0–18.0)
MCH: 25.8 pg — AB (ref 26.0–34.0)
MCHC: 29.6 g/dL — ABNORMAL LOW (ref 32.0–36.0)
MCV: 87.2 fL (ref 80.0–100.0)
PLATELETS: 282 10*3/uL (ref 150–440)
RBC: 3.17 MIL/uL — AB (ref 4.40–5.90)
RDW: 22.7 % — ABNORMAL HIGH (ref 11.5–14.5)
WBC: 12.6 10*3/uL — AB (ref 3.8–10.6)

## 2015-07-28 LAB — BASIC METABOLIC PANEL
ANION GAP: 8 (ref 5–15)
BUN: 11 mg/dL (ref 6–20)
CHLORIDE: 103 mmol/L (ref 101–111)
CO2: 32 mmol/L (ref 22–32)
Calcium: 8.2 mg/dL — ABNORMAL LOW (ref 8.9–10.3)
Creatinine, Ser: 2.81 mg/dL — ABNORMAL HIGH (ref 0.61–1.24)
GFR, EST AFRICAN AMERICAN: 23 mL/min — AB (ref >60)
GFR, EST NON AFRICAN AMERICAN: 20 mL/min — AB (ref >60)
Glucose, Bld: 99 mg/dL (ref 65–99)
POTASSIUM: 2.8 mmol/L — AB (ref 3.5–5.1)
SODIUM: 143 mmol/L (ref 135–145)

## 2015-07-28 LAB — GLUCOSE, CAPILLARY: GLUCOSE-CAPILLARY: 118 mg/dL — AB (ref 65–99)

## 2015-07-28 LAB — MAGNESIUM: MAGNESIUM: 2 mg/dL (ref 1.7–2.4)

## 2015-07-28 SURGERY — DIALYSIS/PERMA CATHETER INSERTION
Anesthesia: Moderate Sedation

## 2015-07-28 MED ORDER — POTASSIUM CHLORIDE CRYS ER 20 MEQ PO TBCR
40.0000 meq | EXTENDED_RELEASE_TABLET | Freq: Two times a day (BID) | ORAL | Status: AC
  Administered 2015-07-28 (×2): 40 meq via ORAL
  Filled 2015-07-28 (×2): qty 2

## 2015-07-28 MED ORDER — DIGOXIN 0.25 MG/ML IJ SOLN
0.2500 mg | INTRAMUSCULAR | Status: AC
  Administered 2015-07-28: 0.25 mg via INTRAVENOUS
  Filled 2015-07-28: qty 1

## 2015-07-28 MED ORDER — PIPERACILLIN-TAZOBACTAM 3.375 G IVPB
3.3750 g | Freq: Two times a day (BID) | INTRAVENOUS | Status: DC
  Administered 2015-07-28 – 2015-07-31 (×5): 3.375 g via INTRAVENOUS
  Filled 2015-07-28 (×9): qty 50

## 2015-07-28 MED ORDER — DIGOXIN 0.25 MG/ML IJ SOLN
0.2500 mg | Freq: Once | INTRAMUSCULAR | Status: AC
  Administered 2015-07-28: 0.25 mg via INTRAVENOUS
  Filled 2015-07-28: qty 1

## 2015-07-28 MED ORDER — SODIUM CHLORIDE 0.9 % IV BOLUS (SEPSIS)
250.0000 mL | Freq: Once | INTRAVENOUS | Status: AC
  Administered 2015-07-28: 250 mL via INTRAVENOUS

## 2015-07-28 MED ORDER — PIPERACILLIN-TAZOBACTAM 3.375 G IVPB
3.3750 g | Freq: Three times a day (TID) | INTRAVENOUS | Status: DC
  Filled 2015-07-28 (×2): qty 50

## 2015-07-28 MED ORDER — DILTIAZEM HCL 25 MG/5ML IV SOLN
10.0000 mg | INTRAVENOUS | Status: DC
  Filled 2015-07-28: qty 5

## 2015-07-28 MED ORDER — METOPROLOL TARTRATE 1 MG/ML IV SOLN
5.0000 mg | Freq: Once | INTRAVENOUS | Status: AC
  Administered 2015-07-28: 5 mg via INTRAVENOUS
  Filled 2015-07-28: qty 5

## 2015-07-28 NOTE — Care Management Important Message (Signed)
Important Message  Patient Details  Name: Sean Delgado. MRN: [id number] Date of Birth: 1933-11-09   Medicare Important Message Given:  Yes    Chapman Fitch, RN 07/28/2015, 9:42 AM

## 2015-07-28 NOTE — Progress Notes (Signed)
Give one time lopressor dose that was ordered when transfer pt to CCU per Dr Anne Hahn

## 2015-07-28 NOTE — Progress Notes (Signed)
Pharmacy Antibiotic Follow-up Note  Sean Delgado. is a 82 y.o. year-old male admitted on 07/22/2015.  The patient is currently on day 6 of Vancomycin   for MRSA bacteremia and aspiration pneumonia.  Assessment/Plan: Patient received HD on 1/16, 1/18 and today on 1/19.  No Vancomycin doses administered per MAR.  Spoke with HD RN and no Vancomycin was given today after HD.  Will order Vancomycin 1 gm IV once to be given as supplemental dose after HD. Trough prior to HD on 1/21.  1/20: Vancomycin trough level scheduled to be drawn prior to dialysis on 1/21.l    Temp (24hrs), Avg:98.9 F (37.2 C), Min:97.7 F (36.5 C), Max:101.2 F (38.4 C)   Recent Labs Lab 07/22/15 1506 07/23/15 0451 07/28/15 0456  WBC 12.9* 10.4 12.6*     Recent Labs Lab 07/22/15 1506 07/22/15 1628 07/23/15 0451 07/28/15 0456  CREATININE 5.53* 5.67* 5.83* 2.81*   Estimated Creatinine Clearance: 24.1 mL/min (by C-G formula based on Cr of 2.81).    No Known Allergies  Antimicrobials this admission: Vanc 1/14 >> Unasyn 1/16 >>1/17 Zosyn 1/14 >> 1/16   Levels/dose changes this admission:   Microbiology results: 1/14 BCx: Staph Aureus mecA gene positive  1/14 MRSA PCR: positive  Thank you for allowing pharmacy to be a part of this patient's care.  Demetrius Charity PharmD 07/28/2015 11:23 AM

## 2015-07-28 NOTE — Progress Notes (Signed)
Transferred to ICU 19 to continue care. Pt niece notified of transfer.

## 2015-07-28 NOTE — Procedures (Signed)
Pt was scheduled for perm cath insertion.  Pt has fever of 101.2 and heart rate 140's.

## 2015-07-28 NOTE — Progress Notes (Signed)
Subjective:  Permcath was to be placed today but cancelled due to fever and tachycardia.  Had HD yesterday and tolerated well. Resting in bed comfortably today.   Objective:  Vital signs in last 24 hours:  Temp:  [97.7 F (36.5 C)-101.2 F (38.4 C)] 99.6 F (37.6 C) (01/20 1400) Pulse Rate:  [110-154] 138 (01/20 1400) Resp:  [16-25] 20 (01/20 1324) BP: (84-121)/(36-70) 97/47 mmHg (01/20 1400) SpO2:  [89 %-99 %] 94 % (01/20 1324)  Weight change: -0.6 kg (-1 lb 5.2 oz) Filed Weights   07/25/15 0638 07/26/15 1338 07/27/15 1215  Weight: 92.1 kg (203 lb 0.7 oz) 92.9 kg (204 lb 12.9 oz) 92.3 kg (203 lb 7.8 oz)    Intake/Output:    Intake/Output Summary (Last 24 hours) at 07/28/15 1451 Last data filed at 07/28/15 1300  Gross per 24 hour  Intake    200 ml  Output   1501 ml  Net  -1301 ml     Physical Exam: General:  no acute distress, laying in the bed   HEENT  anicteric, moist oral mucous membranes   Neck  supple   Pulm/lungs  clear to auscultation bilateral, normal effort  CVS/Heart  S1S2 no rubs  Abdomen:   soft, nontender, nondistended   Extremities:  no peripheral edema   Neurologic:  alert, oriented   Skin:  no acute rashes   Access:  temporary right femoral dialysis catheter        Basic Metabolic Panel:   Recent Labs Lab 07/22/15 1506 07/22/15 1628 07/23/15 0451 07/24/15 0505 07/24/15 1712 07/26/15 0537 07/27/15 1247 07/28/15 0456  NA 135 138 137  --   --   --   --  143  K 2.3* 2.4* 2.7* 4.1  --   --   --  2.8*  CL 98* 99* 100*  --   --   --   --  103  CO2 --   --   --   --  32  GLUCOSE 220* 163* 100*  --   --   --   --  99  BUN 38* 39* 42*  --   --   --   --  11  CREATININE 5.53* 5.67* 5.83*  --   --   --   --  2.81*  CALCIUM 7.7* 7.9* 7.5*  --   --   --   --  8.2*  MG  --   --  1.9 2.4  --   --   --  2.0  PHOS  --   --   --   --  7.0* 5.9* 4.5  --      CBC:  Recent Labs Lab 07/22/15 1506 07/23/15 0451 07/28/15 0456  WBC  12.9* 10.4 12.6*  NEUTROABS 10.9*  --   --   HGB 8.6* 8.9* 8.2*  HCT 28.7* 29.5* 27.6*  MCV 82.3 85.9 87.2  PLT 304 268 282      Microbiology:  Recent Results (from the past 720 hour(s))  Culture, blood (routine x 2)     Status: None   Collection Time: 07/22/15  6:21 PM  Result Value Ref Range Status   Specimen Description BLOOD LEFT HAND  Final   Special Requests BOTTLES DRAWN AEROBIC AND ANAEROBIC  4CC  Final   Culture  Setup Time   Final    GRAM POSITIVE COCCI IN CLUSTERS AEROBIC BOTTLE ONLY CRITICAL RESULT CALLED TO, READ BACK BY AND VERIFIED WITH:  CRYSTAL SCARPENA 07/23/15 1330 MLM    Culture   Final    METHICILLIN RESISTANT STAPHYLOCOCCUS AUREUS AEROBIC BOTTLE ONLY    Report Status 07/27/2015 FINAL  Final   Organism ID, Bacteria METHICILLIN RESISTANT STAPHYLOCOCCUS AUREUS  Final      Susceptibility   Methicillin resistant staphylococcus aureus - MIC*    CIPROFLOXACIN >=8 RESISTANT Resistant     ERYTHROMYCIN <=0.25 SENSITIVE Sensitive     GENTAMICIN <=0.5 SENSITIVE Sensitive     OXACILLIN >=4 RESISTANT Resistant     VANCOMYCIN 1 SENSITIVE Sensitive     TRIMETH/SULFA <=10 SENSITIVE Sensitive     CLINDAMYCIN <=0.25 SENSITIVE Sensitive     CEFOXITIN SCREEN Value in next row Resistant      POSITIVECEFOXITIN SCREEN - This test may be used to predict mecA-mediated oxacillin resistance, and it is based on the cefoxitin disk screen test.  The cefoxitin screen and oxacillin work in combination to determine the final interpretation reported for oxacillin.     Inducible Clindamycin Value in next row Sensitive      POSITIVECEFOXITIN SCREEN - This test may be used to predict mecA-mediated oxacillin resistance, and it is based on the cefoxitin disk screen test.  The cefoxitin screen and oxacillin work in combination to determine the final interpretation reported for oxacillin.     TETRACYCLINE Value in next row Sensitive      SENSITIVE<=1    * METHICILLIN RESISTANT STAPHYLOCOCCUS  AUREUS  Culture, blood (routine x 2)     Status: None   Collection Time: 07/22/15  6:21 PM  Result Value Ref Range Status   Specimen Description BLOOD LEFT HAND  Final   Special Requests   Final    BOTTLES DRAWN AEROBIC AND ANAEROBIC  AER 2CC ANA 4CC   Culture  Setup Time   Final    GRAM POSITIVE COCCI IN CLUSTERS IN BOTH AEROBIC AND ANAEROBIC BOTTLES CRITICAL RESULT CALLED TO, READ BACK BY AND VERIFIED WITH: CRYSTAL SCARPENA 07/23/15 1400 MLM    Culture METHICILLIN RESISTANT STAPHYLOCOCCUS AUREUS  Final   Report Status 07/27/2015 FINAL  Final   Organism ID, Bacteria METHICILLIN RESISTANT STAPHYLOCOCCUS AUREUS  Final      Susceptibility   Methicillin resistant staphylococcus aureus - MIC*    CIPROFLOXACIN >=8 RESISTANT Resistant     ERYTHROMYCIN 0.5 SENSITIVE Sensitive     GENTAMICIN <=0.5 SENSITIVE Sensitive     OXACILLIN >=4 RESISTANT Resistant     VANCOMYCIN 1 SENSITIVE Sensitive     TRIMETH/SULFA <=10 SENSITIVE Sensitive     CLINDAMYCIN <=0.25 SENSITIVE Sensitive     CEFOXITIN SCREEN Value in next row Resistant      POSITIVECEFOXITIN SCREEN - This test may be used to predict mecA-mediated oxacillin resistance, and it is based on the cefoxitin disk screen test.  The cefoxitin screen and oxacillin work in combination to determine the final interpretation reported for oxacillin.     Inducible Clindamycin Value in next row Sensitive      POSITIVECEFOXITIN SCREEN - This test may be used to predict mecA-mediated oxacillin resistance, and it is based on the cefoxitin disk screen test.  The cefoxitin screen and oxacillin work in combination to determine the final interpretation reported for oxacillin.     TETRACYCLINE Value in next row Sensitive      SENSITIVE<=1    * METHICILLIN RESISTANT STAPHYLOCOCCUS AUREUS  Blood Culture ID Panel (Reflexed)     Status: Abnormal   Collection Time: 07/22/15  6:21 PM  Result Value Ref Range  Status   Enterococcus species NOT DETECTED NOT DETECTED Final    Listeria monocytogenes NOT DETECTED NOT DETECTED Final   Staphylococcus species DETECTED (A) NOT DETECTED Final   Staphylococcus aureus DETECTED (A) NOT DETECTED Final    Comment: CRITICAL RESULT CALLED TO, READ BACK BY AND VERIFIED WITH: CRYSTAL SCARPENA 07/23/15 1330 ABOUT STAPH SPECIES,STAPH AUREUS, AND MecA DETECTED MLM    Streptococcus species NOT DETECTED NOT DETECTED Final   Streptococcus agalactiae NOT DETECTED NOT DETECTED Final   Streptococcus pneumoniae NOT DETECTED NOT DETECTED Final   Streptococcus pyogenes NOT DETECTED NOT DETECTED Final   Acinetobacter baumannii NOT DETECTED NOT DETECTED Final   Enterobacteriaceae species NOT DETECTED NOT DETECTED Final   Enterobacter cloacae complex NOT DETECTED NOT DETECTED Final   Escherichia coli NOT DETECTED NOT DETECTED Final   Klebsiella oxytoca NOT DETECTED NOT DETECTED Final   Klebsiella pneumoniae NOT DETECTED NOT DETECTED Final   Proteus species NOT DETECTED NOT DETECTED Final   Serratia marcescens NOT DETECTED NOT DETECTED Final   Haemophilus influenzae NOT DETECTED NOT DETECTED Final   Neisseria meningitidis NOT DETECTED NOT DETECTED Final   Pseudomonas aeruginosa NOT DETECTED NOT DETECTED Final   Candida albicans NOT DETECTED NOT DETECTED Final   Candida glabrata NOT DETECTED NOT DETECTED Final   Candida krusei NOT DETECTED NOT DETECTED Final   Candida parapsilosis NOT DETECTED NOT DETECTED Final   Candida tropicalis NOT DETECTED NOT DETECTED Final   Carbapenem resistance NOT DETECTED NOT DETECTED Final   Methicillin resistance DETECTED (A) NOT DETECTED Final   Vancomycin resistance NOT DETECTED NOT DETECTED Final  MRSA PCR Screening     Status: Abnormal   Collection Time: 07/23/15  5:24 AM  Result Value Ref Range Status   MRSA by PCR POSITIVE (A) NEGATIVE Final    Comment:        The GeneXpert MRSA Assay (FDA approved for NASAL specimens only), is one component of a comprehensive MRSA colonization surveillance  program. It is not intended to diagnose MRSA infection nor to guide or monitor treatment for MRSA infections. CRITICAL RESULT CALLED TO, READ BACK BY AND VERIFIED WITH: ALISHA BABB ON 07/23/15 AT 0735 BY QSD   CULTURE, BLOOD (ROUTINE X 2) w Reflex to PCR ID Panel     Status: None (Preliminary result)   Collection Time: 07/25/15  6:08 AM  Result Value Ref Range Status   Specimen Description BLOOD LEFT AC  Final   Special Requests BOTTLES DRAWN AEROBIC AND ANAEROBIC ANA2ML AER3ML  Final   Culture NO GROWTH 3 DAYS  Final   Report Status PENDING  Incomplete  CULTURE, BLOOD (ROUTINE X 2) w Reflex to PCR ID Panel     Status: None (Preliminary result)   Collection Time: 07/25/15  6:08 AM  Result Value Ref Range Status   Specimen Description BLOOD LEFT WRIST  Final   Special Requests   Final    BOTTLES DRAWN AEROBIC AND ANAEROBIC ANA AER   Culture NO GROWTH 3 DAYS  Final   Report Status PENDING  Incomplete    Coagulation Studies: No results for input(s): LABPROT, INR in the last 72 hours.  Urinalysis: No results for input(s): COLORURINE, LABSPEC, PHURINE, GLUCOSEU, HGBUR, BILIRUBINUR, KETONESUR, PROTEINUR, UROBILINOGEN, NITRITE, LEUKOCYTESUR in the last 72 hours.  Invalid input(s): APPERANCEUR    Imaging: No results found.   Medications:     . allopurinol  100 mg Oral Daily  . aspirin EC  81 mg Oral Daily  . atorvastatin  20 mg Oral Daily  . clopidogrel  75 mg Oral Daily  . diltiazem  180 mg Oral Daily  . epoetin (EPOGEN/PROCRIT) injection  10,000 Units Intravenous Q T,Th,Sa-HD  . ferrous sulfate  325 mg Oral Daily  . heparin  5,000 Units Subcutaneous 3 times per day  . latanoprost  1 drop Both Eyes QHS  . potassium chloride  40 mEq Oral BID  . pregabalin  75 mg Oral Daily  . senna  17.2 mg Oral QHS  . vancomycin  1,000 mg Intravenous Q T,Th,Sa-HD   sodium chloride, sodium chloride, alteplase, benzonatate, heparin, lidocaine (PF), lidocaine-prilocaine, naLOXone  (NARCAN)  injection, oxyCODONE-acetaminophen, pentafluoroprop-tetrafluoroeth  Assessment/ Plan:  82 y.o. african [place] male  with right eye blindness, diastolic congestive heart failure, hypertension, gout, glaucoma, peripheral neuropathy, atrial fibrillation, anemia, carotid stenosis status post bilateral CEA, CVA, peripheral vascular disease  [facility]/ TTS  1. End Stage Renal Disease/MRSA sepsis:  IJ perncath appears to have completely dislodged.  -Pt had HD yesterday, no acute indication for HD today, permcath placement stopped due to fever and tachycardia today.  2. Anemia of chronic kidney disease: Hemoglobin 8.9 - continue Epogen 10,000 units IV with dialysis, hgb 8.2.  3. Secondary Hyperparathyroidism:  - phos down to 4.5 at last check.  Continue to monitor.  4. Hypokalemia -K down to 2.8 today, pt placed on oral K, continue to monitor K levels.     LOS: 6 Sean Delgado 1/20/20172:51 PM

## 2015-07-28 NOTE — Progress Notes (Signed)
Patient ID: Authur Cubit., male   DOB: 1933-07-28, 82 y.o.   MRN: [id number] [facility] PROGRESS NOTE  PCP: Bobbye Morton, MD  HPI/Subjective: Patient complaint of cough. He was found to have fever, tachycardia and low potassium, so Perma-Caths placement was canceled.  Objective: Filed Vitals:   07/28/15 1344 07/28/15 1400  BP: 84/38 97/47  Pulse:  138  Temp:  99.6 F (37.6 C)  Resp:      Filed Weights   07/25/15 0638 07/26/15 1338 07/27/15 1215  Weight: 92.1 kg (203 lb 0.7 oz) 92.9 kg (204 lb 12.9 oz) 92.3 kg (203 lb 7.8 oz)    ROS: Review of Systems  Constitutional: Negative for fever and chills.  Eyes: Negative for blurred vision.  Respiratory: Positive for cough. Negative for shortness of breath.   Cardiovascular: Negative for chest pain.  Gastrointestinal: Negative for nausea, vomiting, abdominal pain, diarrhea and constipation.  Genitourinary: Negative for dysuria.  Neurological: Negative for dizziness and headaches.   Exam: Physical Exam  HENT:  Nose: No mucosal edema.  Mouth/Throat: No oropharyngeal exudate or posterior oropharyngeal edema.  Eyes: Conjunctivae, EOM and lids are normal. Pupils are equal, round, and reactive to light.  Neck: No JVD present. Carotid bruit is not present. No edema present. No thyroid mass and no thyromegaly present.  Cardiovascular: An irregular rhythm present. Tachycardia present.  Exam reveals no gallop.   Murmur heard.  Systolic murmur is present with a grade of 2/6  Pulses:      Dorsalis pedis pulses are 2+ on the right side, and 2+ on the left side.  Respiratory: No respiratory distress. He has no wheezes. He has no rhonchi. He has no rales.  GI: Soft. Bowel sounds are normal. There is no tenderness.  Musculoskeletal:  Pain when flexing the right hip.  Lymphadenopathy:    He has no cervical adenopathy.  Neurological: He is alert.  Skin: Skin is warm. Nails show no clubbing.  Stage I decubitus as per  nurse on the right buttock. Dry skin on the lower extremities.  Psychiatric: He has a normal mood and affect.      Data Reviewed: Basic Metabolic Panel:  Recent Labs Lab 07/22/15 1506 07/22/15 1628 07/23/15 0451 07/24/15 0505 07/24/15 1712 07/26/15 0537 07/27/15 1247 07/28/15 0456  NA 135 138 137  --   --   --   --  143  K 2.3* 2.4* 2.7* 4.1  --   --   --  2.8*  CL 98* 99* 100*  --   --   --   --  103  CO2 --   --   --   --  32  GLUCOSE 220* 163* 100*  --   --   --   --  99  BUN 38* 39* 42*  --   --   --   --  11  CREATININE 5.53* 5.67* 5.83*  --   --   --   --  2.81*  CALCIUM 7.7* 7.9* 7.5*  --   --   --   --  8.2*  MG  --   --  1.9 2.4  --   --   --  2.0  PHOS  --   --   --   --  7.0* 5.9* 4.5  --   CBC:  Recent Labs Lab 07/22/15 1506 07/23/15 0451 07/28/15 0456  WBC 12.9* 10.4 12.6*  NEUTROABS 10.9*  --   --  HGB 8.6* 8.9* 8.2*  HCT 28.7* 29.5* 27.6*  MCV 82.3 85.9 87.2  PLT 304 268 282    Recent Results (from the past 240 hour(s))  Culture, blood (routine x 2)     Status: None   Collection Time: 07/22/15  6:21 PM  Result Value Ref Range Status   Specimen Description BLOOD LEFT HAND  Final   Special Requests BOTTLES DRAWN AEROBIC AND ANAEROBIC  4CC  Final   Culture  Setup Time   Final    GRAM POSITIVE COCCI IN CLUSTERS AEROBIC BOTTLE ONLY CRITICAL RESULT CALLED TO, READ BACK BY AND VERIFIED WITH: CRYSTAL SCARPENA 07/23/15 1330 MLM    Culture   Final    METHICILLIN RESISTANT STAPHYLOCOCCUS AUREUS AEROBIC BOTTLE ONLY    Report Status 07/27/2015 FINAL  Final   Organism ID, Bacteria METHICILLIN RESISTANT STAPHYLOCOCCUS AUREUS  Final      Susceptibility   Methicillin resistant staphylococcus aureus - MIC*    CIPROFLOXACIN >=8 RESISTANT Resistant     ERYTHROMYCIN <=0.25 SENSITIVE Sensitive     GENTAMICIN <=0.5 SENSITIVE Sensitive     OXACILLIN >=4 RESISTANT Resistant     VANCOMYCIN 1 SENSITIVE Sensitive     TRIMETH/SULFA <=10 SENSITIVE  Sensitive     CLINDAMYCIN <=0.25 SENSITIVE Sensitive     CEFOXITIN SCREEN Value in next row Resistant      POSITIVECEFOXITIN SCREEN - This test may be used to predict mecA-mediated oxacillin resistance, and it is based on the cefoxitin disk screen test.  The cefoxitin screen and oxacillin work in combination to determine the final interpretation reported for oxacillin.     Inducible Clindamycin Value in next row Sensitive      POSITIVECEFOXITIN SCREEN - This test may be used to predict mecA-mediated oxacillin resistance, and it is based on the cefoxitin disk screen test.  The cefoxitin screen and oxacillin work in combination to determine the final interpretation reported for oxacillin.     TETRACYCLINE Value in next row Sensitive      SENSITIVE<=1    * METHICILLIN RESISTANT STAPHYLOCOCCUS AUREUS  Culture, blood (routine x 2)     Status: None   Collection Time: 07/22/15  6:21 PM  Result Value Ref Range Status   Specimen Description BLOOD LEFT HAND  Final   Special Requests   Final    BOTTLES DRAWN AEROBIC AND ANAEROBIC  AER 2CC ANA 4CC   Culture  Setup Time   Final    GRAM POSITIVE COCCI IN CLUSTERS IN BOTH AEROBIC AND ANAEROBIC BOTTLES CRITICAL RESULT CALLED TO, READ BACK BY AND VERIFIED WITH: CRYSTAL SCARPENA 07/23/15 1400 MLM    Culture METHICILLIN RESISTANT STAPHYLOCOCCUS AUREUS  Final   Report Status 07/27/2015 FINAL  Final   Organism ID, Bacteria METHICILLIN RESISTANT STAPHYLOCOCCUS AUREUS  Final      Susceptibility   Methicillin resistant staphylococcus aureus - MIC*    CIPROFLOXACIN >=8 RESISTANT Resistant     ERYTHROMYCIN 0.5 SENSITIVE Sensitive     GENTAMICIN <=0.5 SENSITIVE Sensitive     OXACILLIN >=4 RESISTANT Resistant     VANCOMYCIN 1 SENSITIVE Sensitive     TRIMETH/SULFA <=10 SENSITIVE Sensitive     CLINDAMYCIN <=0.25 SENSITIVE Sensitive     CEFOXITIN SCREEN Value in next row Resistant      POSITIVECEFOXITIN SCREEN - This test may be used to predict mecA-mediated  oxacillin resistance, and it is based on the cefoxitin disk screen test.  The cefoxitin screen and oxacillin work in combination to determine the final interpretation  reported for oxacillin.     Inducible Clindamycin Value in next row Sensitive      POSITIVECEFOXITIN SCREEN - This test may be used to predict mecA-mediated oxacillin resistance, and it is based on the cefoxitin disk screen test.  The cefoxitin screen and oxacillin work in combination to determine the final interpretation reported for oxacillin.     TETRACYCLINE Value in next row Sensitive      SENSITIVE<=1    * METHICILLIN RESISTANT STAPHYLOCOCCUS AUREUS  Blood Culture ID Panel (Reflexed)     Status: Abnormal   Collection Time: 07/22/15  6:21 PM  Result Value Ref Range Status   Enterococcus species NOT DETECTED NOT DETECTED Final   Listeria monocytogenes NOT DETECTED NOT DETECTED Final   Staphylococcus species DETECTED (A) NOT DETECTED Final   Staphylococcus aureus DETECTED (A) NOT DETECTED Final    Comment: CRITICAL RESULT CALLED TO, READ BACK BY AND VERIFIED WITH: CRYSTAL SCARPENA 07/23/15 1330 ABOUT STAPH SPECIES,STAPH AUREUS, AND MecA DETECTED MLM    Streptococcus species NOT DETECTED NOT DETECTED Final   Streptococcus agalactiae NOT DETECTED NOT DETECTED Final   Streptococcus pneumoniae NOT DETECTED NOT DETECTED Final   Streptococcus pyogenes NOT DETECTED NOT DETECTED Final   Acinetobacter baumannii NOT DETECTED NOT DETECTED Final   Enterobacteriaceae species NOT DETECTED NOT DETECTED Final   Enterobacter cloacae complex NOT DETECTED NOT DETECTED Final   Escherichia coli NOT DETECTED NOT DETECTED Final   Klebsiella oxytoca NOT DETECTED NOT DETECTED Final   Klebsiella pneumoniae NOT DETECTED NOT DETECTED Final   Proteus species NOT DETECTED NOT DETECTED Final   Serratia marcescens NOT DETECTED NOT DETECTED Final   Haemophilus influenzae NOT DETECTED NOT DETECTED Final   Neisseria meningitidis NOT DETECTED NOT DETECTED  Final   Pseudomonas aeruginosa NOT DETECTED NOT DETECTED Final   Candida albicans NOT DETECTED NOT DETECTED Final   Candida glabrata NOT DETECTED NOT DETECTED Final   Candida krusei NOT DETECTED NOT DETECTED Final   Candida parapsilosis NOT DETECTED NOT DETECTED Final   Candida tropicalis NOT DETECTED NOT DETECTED Final   Carbapenem resistance NOT DETECTED NOT DETECTED Final   Methicillin resistance DETECTED (A) NOT DETECTED Final   Vancomycin resistance NOT DETECTED NOT DETECTED Final  MRSA PCR Screening     Status: Abnormal   Collection Time: 07/23/15  5:24 AM  Result Value Ref Range Status   MRSA by PCR POSITIVE (A) NEGATIVE Final    Comment:        The GeneXpert MRSA Assay (FDA approved for NASAL specimens only), is one component of a comprehensive MRSA colonization surveillance program. It is not intended to diagnose MRSA infection nor to guide or monitor treatment for MRSA infections. CRITICAL RESULT CALLED TO, READ BACK BY AND VERIFIED WITH: ALISHA BABB ON 07/23/15 AT 0735 BY QSD   CULTURE, BLOOD (ROUTINE X 2) w Reflex to PCR ID Panel     Status: None (Preliminary result)   Collection Time: 07/25/15  6:08 AM  Result Value Ref Range Status   Specimen Description BLOOD LEFT AC  Final   Special Requests BOTTLES DRAWN AEROBIC AND ANAEROBIC ANA2ML AER3ML  Final   Culture NO GROWTH 3 DAYS  Final   Report Status PENDING  Incomplete  CULTURE, BLOOD (ROUTINE X 2) w Reflex to PCR ID Panel     Status: None (Preliminary result)   Collection Time: 07/25/15  6:08 AM  Result Value Ref Range Status   Specimen Description BLOOD LEFT WRIST  Final   Special Requests  Final    BOTTLES DRAWN AEROBIC AND ANAEROBIC ANA AER   Culture NO GROWTH 3 DAYS  Final   Report Status PENDING  Incomplete     Studies: No results found.  Scheduled Meds: . allopurinol  100 mg Oral Daily  . aspirin EC  81 mg Oral Daily  . atorvastatin  20 mg Oral Daily  . clopidogrel  75 mg Oral Daily  .  diltiazem  180 mg Oral Daily  . epoetin (EPOGEN/PROCRIT) injection  10,000 Units Intravenous Q T,Th,Sa-HD  . ferrous sulfate  325 mg Oral Daily  . heparin  5,000 Units Subcutaneous 3 times per day  . latanoprost  1 drop Both Eyes QHS  . potassium chloride  40 mEq Oral BID  . pregabalin  75 mg Oral Daily  . senna  17.2 mg Oral QHS  . vancomycin  1,000 mg Intravenous Q T,Th,Sa-HD    Assessment/Plan:  1. Staph aureus sepsis with MRSA bacteremia, Likely source of fever, dialysis catheter left chest that was removed.  Continue vancomycin during HD for 2 weeks and discontinue Unasyn per Dr. Sampson Goon. If  Repeated blood cultures are negative for 2 days, may get permacath per Dr. Sampson Goon.  Echo did not report any vegetation. Perma cath placement was canceled today due to fever, A. fib with RVR and low potassium.  He has LLL consolidation and atelectasis but no aspiration pneumonia.  2. Hypokalemia and hypomagnesemia,  Given KCl, magnesium level is normal. 3. End-stage renal disease on dialysis Tuesday Thursday and Saturday. Permacath removed, on temporary catheter until sepsis clears.   4. Hypotension, improved,  hold his blood pressure medications 5. Peripheral vascular disease on aspirin and Plavix 6. Hyperlipidemia unspecified on atorvastatin 7. History of gout on allopurinol 8. Glaucoma unspecified continue latanoprost 9. Anemia of chronic disease Procrit with dialysis and continue iron supplementation   * Afib with RVR. Give digoxin 0.25 mg iv one dose. Continue cardizem, watch for VS. Cardiology consult prn.   Code Status:     Code Status Orders        Start     Ordered   07/22/15 2038  Full code   Continuous     07/22/15 2038    Code Status History    Date Active Date Inactive Code Status Order ID Comments User Context   06/24/2015  4:07 PM 06/27/2015 12:37 AM Full Code [id number]  Auburn Bilberry, MD Inpatient   05/30/2015  5:22 PM 06/05/2015  6:42 PM Full Code  [id number]  Trudee Kuster, RN Inpatient   05/30/2015  1:59 PM 05/30/2015  5:22 PM DNR [id number]  Shaune Pollack, MD Inpatient   05/29/2015  3:57 PM 05/29/2015  7:32 PM Full Code [id number]  Annice Needy, MD Inpatient   04/08/2015  8:31 PM 04/17/2015 11:42 PM Full Code [id number]  Gracelyn Nurse, MD Inpatient   03/04/2015  4:13 PM 03/11/2015  7:00 PM DNR [id number]  Houston Siren, MD Inpatient     I discussed with the patient daughter. Greater than 50% time was spent on coordination of care and face-to-face counseling. Disposition Plan: possible discharge back to SNF in 2-3 days. Discussed with Dr. Cherylann Ratel and Dr. Sampson Goon.  Consultants:  Nephrology  Vascular surgery  Antibiotics:  Vancomycin  Unasyn  Time spent: 42 minutes  Shaune Pollack  [facility]

## 2015-07-28 NOTE — Clinical Social Work Note (Signed)
Patient not getting permcath today due to tachycardia. Patient not yet to return to [facility].  York Spaniel MSW,LCSW [phone]

## 2015-07-28 NOTE — Progress Notes (Signed)
eLink Physician-Brief Progress Note Patient Name: Sean Delgado. DOB: [birth year] MRN: [id number]   Date of Service  07/28/2015  HPI/Events of Note  63 M with ESRD admitted several days prior with loss of dialysis access.  Found to have MRSA bacteremia.  Non-tunneled dialysis catheter placed.  Now admitted to ICU with AF/RVR.  On dilt po but given digoxin x 2 and 250 cc fluid bolus.  HR currently 90s to 110 with BP of 134/49 (71).  Patient in NAD with RR of 25 and sats of 94%.  eICU Interventions  Plan of care per primary team. Continue to monitor via [facility]     Intervention Category Evaluation Type: New Patient Evaluation  DETERDING,ELIZABETH 07/28/2015, 10:02 PM

## 2015-07-28 NOTE — Progress Notes (Signed)
ANTIBIOTIC CONSULT NOTE - INITIAL  Pharmacy Consult for piperacillin/tazobactam Indication: pneumonia (HCAP)  No Known Allergies  Patient Measurements: Height:  (185.4 cm) Weight: 203 lb 7.8 oz (92.3 kg) IBW/kg (Calculated) : 79.9  Vital Signs: Temp: 99.6 F (37.6 C) (01/20 1400) Temp Source: Oral (01/20 1400) BP: 97/47 mmHg (01/20 1400) Pulse Rate: 138 (01/20 1400) Intake/Output from previous day: 01/19 0701 - 01/20 0700 In: 0  Out: 1501 [Stool:1] Intake/Output from this shift: Total I/O In: 200 [P.O.:200] Out: -   Labs:  Recent Labs  07/28/15 0456  WBC 12.6*  HGB 8.2*  PLT 282  CREATININE 2.81*   Estimated Creatinine Clearance: 24.1 mL/min (by C-G formula based on Cr of 2.81). No results for input(s): VANCOTROUGH, VANCOPEAK, VANCORANDOM, GENTTROUGH, GENTPEAK, GENTRANDOM, TOBRATROUGH, TOBRAPEAK, TOBRARND, AMIKACINPEAK, AMIKACINTROU, AMIKACIN in the last 72 hours.   Microbiology: Recent Results (from the past 720 hour(s))  Culture, blood (routine x 2)     Status: None   Collection Time: 07/22/15  6:21 PM  Result Value Ref Range Status   Specimen Description BLOOD LEFT HAND  Final   Special Requests BOTTLES DRAWN AEROBIC AND ANAEROBIC  4CC  Final   Culture  Setup Time   Final    GRAM POSITIVE COCCI IN CLUSTERS AEROBIC BOTTLE ONLY CRITICAL RESULT CALLED TO, READ BACK BY AND VERIFIED WITH: CRYSTAL SCARPENA 07/23/15 1330 MLM    Culture   Final    METHICILLIN RESISTANT STAPHYLOCOCCUS AUREUS AEROBIC BOTTLE ONLY    Report Status 07/27/2015 FINAL  Final   Organism ID, Bacteria METHICILLIN RESISTANT STAPHYLOCOCCUS AUREUS  Final      Susceptibility   Methicillin resistant staphylococcus aureus - MIC*    CIPROFLOXACIN >=8 RESISTANT Resistant     ERYTHROMYCIN <=0.25 SENSITIVE Sensitive     GENTAMICIN <=0.5 SENSITIVE Sensitive     OXACILLIN >=4 RESISTANT Resistant     VANCOMYCIN 1 SENSITIVE Sensitive     TRIMETH/SULFA <=10 SENSITIVE Sensitive     CLINDAMYCIN  <=0.25 SENSITIVE Sensitive     CEFOXITIN SCREEN Value in next row Resistant      POSITIVECEFOXITIN SCREEN - This test may be used to predict mecA-mediated oxacillin resistance, and it is based on the cefoxitin disk screen test.  The cefoxitin screen and oxacillin work in combination to determine the final interpretation reported for oxacillin.     Inducible Clindamycin Value in next row Sensitive      POSITIVECEFOXITIN SCREEN - This test may be used to predict mecA-mediated oxacillin resistance, and it is based on the cefoxitin disk screen test.  The cefoxitin screen and oxacillin work in combination to determine the final interpretation reported for oxacillin.     TETRACYCLINE Value in next row Sensitive      SENSITIVE<=1    * METHICILLIN RESISTANT STAPHYLOCOCCUS AUREUS  Culture, blood (routine x 2)     Status: None   Collection Time: 07/22/15  6:21 PM  Result Value Ref Range Status   Specimen Description BLOOD LEFT HAND  Final   Special Requests   Final    BOTTLES DRAWN AEROBIC AND ANAEROBIC  AER 2CC ANA 4CC   Culture  Setup Time   Final    GRAM POSITIVE COCCI IN CLUSTERS IN BOTH AEROBIC AND ANAEROBIC BOTTLES CRITICAL RESULT CALLED TO, READ BACK BY AND VERIFIED WITH: CRYSTAL SCARPENA 07/23/15 1400 MLM    Culture METHICILLIN RESISTANT STAPHYLOCOCCUS AUREUS  Final   Report Status 07/27/2015 FINAL  Final   Organism ID, Bacteria METHICILLIN RESISTANT STAPHYLOCOCCUS AUREUS  Final      Susceptibility   Methicillin resistant staphylococcus aureus - MIC*    CIPROFLOXACIN >=8 RESISTANT Resistant     ERYTHROMYCIN 0.5 SENSITIVE Sensitive     GENTAMICIN <=0.5 SENSITIVE Sensitive     OXACILLIN >=4 RESISTANT Resistant     VANCOMYCIN 1 SENSITIVE Sensitive     TRIMETH/SULFA <=10 SENSITIVE Sensitive     CLINDAMYCIN <=0.25 SENSITIVE Sensitive     CEFOXITIN SCREEN Value in next row Resistant      POSITIVECEFOXITIN SCREEN - This test may be used to predict mecA-mediated oxacillin resistance, and it is  based on the cefoxitin disk screen test.  The cefoxitin screen and oxacillin work in combination to determine the final interpretation reported for oxacillin.     Inducible Clindamycin Value in next row Sensitive      POSITIVECEFOXITIN SCREEN - This test may be used to predict mecA-mediated oxacillin resistance, and it is based on the cefoxitin disk screen test.  The cefoxitin screen and oxacillin work in combination to determine the final interpretation reported for oxacillin.     TETRACYCLINE Value in next row Sensitive      SENSITIVE<=1    * METHICILLIN RESISTANT STAPHYLOCOCCUS AUREUS  Blood Culture ID Panel (Reflexed)     Status: Abnormal   Collection Time: 07/22/15  6:21 PM  Result Value Ref Range Status   Enterococcus species NOT DETECTED NOT DETECTED Final   Listeria monocytogenes NOT DETECTED NOT DETECTED Final   Staphylococcus species DETECTED (A) NOT DETECTED Final   Staphylococcus aureus DETECTED (A) NOT DETECTED Final    Comment: CRITICAL RESULT CALLED TO, READ BACK BY AND VERIFIED WITH: CRYSTAL SCARPENA 07/23/15 1330 ABOUT STAPH SPECIES,STAPH AUREUS, AND MecA DETECTED MLM    Streptococcus species NOT DETECTED NOT DETECTED Final   Streptococcus agalactiae NOT DETECTED NOT DETECTED Final   Streptococcus pneumoniae NOT DETECTED NOT DETECTED Final   Streptococcus pyogenes NOT DETECTED NOT DETECTED Final   Acinetobacter baumannii NOT DETECTED NOT DETECTED Final   Enterobacteriaceae species NOT DETECTED NOT DETECTED Final   Enterobacter cloacae complex NOT DETECTED NOT DETECTED Final   Escherichia coli NOT DETECTED NOT DETECTED Final   Klebsiella oxytoca NOT DETECTED NOT DETECTED Final   Klebsiella pneumoniae NOT DETECTED NOT DETECTED Final   Proteus species NOT DETECTED NOT DETECTED Final   Serratia marcescens NOT DETECTED NOT DETECTED Final   Haemophilus influenzae NOT DETECTED NOT DETECTED Final   Neisseria meningitidis NOT DETECTED NOT DETECTED Final   Pseudomonas aeruginosa  NOT DETECTED NOT DETECTED Final   Candida albicans NOT DETECTED NOT DETECTED Final   Candida glabrata NOT DETECTED NOT DETECTED Final   Candida krusei NOT DETECTED NOT DETECTED Final   Candida parapsilosis NOT DETECTED NOT DETECTED Final   Candida tropicalis NOT DETECTED NOT DETECTED Final   Carbapenem resistance NOT DETECTED NOT DETECTED Final   Methicillin resistance DETECTED (A) NOT DETECTED Final   Vancomycin resistance NOT DETECTED NOT DETECTED Final  MRSA PCR Screening     Status: Abnormal   Collection Time: 07/23/15  5:24 AM  Result Value Ref Range Status   MRSA by PCR POSITIVE (A) NEGATIVE Final    Comment:        The GeneXpert MRSA Assay (FDA approved for NASAL specimens only), is one component of a comprehensive MRSA colonization surveillance program. It is not intended to diagnose MRSA infection nor to guide or monitor treatment for MRSA infections. CRITICAL RESULT CALLED TO, READ BACK BY AND VERIFIED WITH: ALISHA BABB ON 07/23/15  AT 0735 BY QSD   CULTURE, BLOOD (ROUTINE X 2) w Reflex to PCR ID Panel     Status: None (Preliminary result)   Collection Time: 07/25/15  6:08 AM  Result Value Ref Range Status   Specimen Description BLOOD LEFT AC  Final   Special Requests BOTTLES DRAWN AEROBIC AND ANAEROBIC ANA2ML AER3ML  Final   Culture NO GROWTH 3 DAYS  Final   Report Status PENDING  Incomplete  CULTURE, BLOOD (ROUTINE X 2) w Reflex to PCR ID Panel     Status: None (Preliminary result)   Collection Time: 07/25/15  6:08 AM  Result Value Ref Range Status   Specimen Description BLOOD LEFT WRIST  Final   Special Requests   Final    BOTTLES DRAWN AEROBIC AND ANAEROBIC ANA AER   Culture NO GROWTH 3 DAYS  Final   Report Status PENDING  Incomplete   Assessment: Pharmacy consulted by ID to dose piperacillin/tazobactam in this 82 year old male for HCAP. Patient is on HD.  Plan:  Piperacillin/tazobactam 3.375 g IV q 12 hours per renal function and indication.  Thank  you for the consult. Pharmacy will continue to monitor.  Cindi Carbon, PharmD Clinical Pharmacist 07/28/2015,3:50 PM

## 2015-07-28 NOTE — Progress Notes (Signed)
Speech Therapy Note: reviewed chart/MD notes; no significant decline in respiratory status and no indication on aspiration pneumonia per MD note. Pt continues on a Dys. 1 w/ Honey consistency liquids w/ some po amount by mouth; Dietician following. No overt s/s of aspiration have been reported during oral intake. Rec'd continued use of general aspiration precautions during any oral intake. NSG updated. ST services will f/u w/ pt's status next 2-3 days as indicated.

## 2015-07-28 NOTE — Op Note (Signed)
OPERATIVE NOTE   PROCEDURE: 1. Insertion of non-tunneled dialysis catheter right femoral approach same venous access.  PRE-OPERATIVE DIAGNOSIS: Fever with tachycardia; end-stage renal disease requiring hemodialysis  POST-OPERATIVE DIAGNOSIS: Same  SURGEON: Luiscarlos Kaczmarczyk, Latina Craver  ESTIMATED BLOOD LOSS: Minimal cc  CONTRAST USED:  None  INDICATIONS:   Sean Delgado. is a 82 y.o.y.o. male who presents with fever and tachycardia associated with a right femoral non-tunneled dialysis catheter.  Adequate dialysis has not been possible.  DESCRIPTION: After obtaining full informed written consent, the patient was positioned supine. The right groin was prepped and draped in a sterile fashion.  The existing catheter is then transected proximal to the skin insertion site.  The guidewire is advanced without difficulty.  The non-dialysis catheter is fed into the central venous system without difficulty.  All lumens aspirate and flush well.  Catheter secured to the skin of the right thigh with 2-0 nylon. A sterile dressing is applied with a Biopatch.  COMPLICATIONS: None  CONDITION: [name], [name] Office:  [phone]   07/28/2015,7:18 PM

## 2015-07-28 NOTE — Progress Notes (Signed)
Notified MD of special procedures sending pt back without new catheter procedure complete due to critical K, temp elevation, and HR 130's.

## 2015-07-28 NOTE — H&P (Signed)
Called, patient in afib RVR.  Given digoxin x2, blood pressure soft.  Requiring more O2.  Recently febrile with abx started and workup for infection.  ESRD patient on HD, last HD 07/27/15 with 1.5 L removed, on for 3.5 hours.  Getting 250 cc NS bolus now.  will transfer to stepdown, try IV lopressor, can give more gentle fluids if needed as patient is on dry side on exam.  Kristeen Miss [facility] 07/28/2015, 8:45 PM

## 2015-07-28 NOTE — Progress Notes (Signed)
[facility] CLINIC INFECTIOUS DISEASE PROGRESS NOTE Date of Admission:  07/22/2015     ID: Sean Delgado. is a 82 y.o. male with  MRSA bacteremia, HD patient  Active Problems:   Acute respiratory failure (HCC)   Sepsis (HCC)   Aspiration pneumonia (HCC)   Malnutrition of moderate degree   Subjective: Had HD today through R groin HD temp cath. Denies fever, pain Cx neg 1/17   ROS  Eleven systems are reviewed and negative except per hpi  Medications:  Antibiotics Given (last 72 hours)    Date/Time Action Medication Dose Rate   07/26/15 1346 Given   vancomycin (VANCOCIN) IVPB 1000 mg/200 mL premix 1,000 mg 200 mL/hr   07/27/15 1653 Given   vancomycin (VANCOCIN) IVPB 1000 mg/200 mL premix 1,000 mg 200 mL/hr   07/28/15 0000 Given   ceFAZolin (ANCEF) IVPB 1 g/50 mL premix 1 g 100 mL/hr     . allopurinol  100 mg Oral Daily  . aspirin EC  81 mg Oral Daily  . atorvastatin  20 mg Oral Daily  . clopidogrel  75 mg Oral Daily  . digoxin  0.25 mg Intravenous Once  . diltiazem  180 mg Oral Daily  . epoetin (EPOGEN/PROCRIT) injection  10,000 Units Intravenous Q T,Th,Sa-HD  . ferrous sulfate  325 mg Oral Daily  . heparin  5,000 Units Subcutaneous 3 times per day  . latanoprost  1 drop Both Eyes QHS  . potassium chloride  40 mEq Oral BID  . pregabalin  75 mg Oral Daily  . senna  17.2 mg Oral QHS  . vancomycin  1,000 mg Intravenous Q T,Th,Sa-HD    Objective: Vital signs in last 24 hours: Temp:  [97.7 F (36.5 C)-101.2 F (38.4 C)] 99.6 F (37.6 C) (01/20 1400) Pulse Rate:  [110-149] 138 (01/20 1400) Resp:  [16-25] 20 (01/20 1324) BP: (84-121)/(36-70) 97/47 mmHg (01/20 1400) SpO2:  [89 %-98 %] 94 % (01/20 1324) Constitutional: He is oriented to person, place, and time. Appears chronically ill HENT: anicteric Mouth/Throat: Oropharynx is clear and dry. No oropharyngeal exudate.  Cardiovascular: Normal rate, regular rhythm and normal heart sounds. Distant HD Pulmonary/Chest: Effort  normal and breath sounds normal. No respiratory distress. He has no wheezes.  Abdominal: Soft. Bowel sounds are normal. He exhibits no distension. There is no tenderness.  Lymphadenopathy: He has no cervical adenopathy.  Neurological: He is alert and oriented to person, place, and time.  Skin: dry scaly skin  Psychiatric: He has a normal mood and affect. His behavior is normal.  L chest wall HD cath site covered- nontender R groin temp cath in place  Lab Results  Recent Labs  07/28/15 0456  WBC 12.6*  HGB 8.2*  HCT 27.6*  NA 143  K 2.8*  CL 103  CO2 32  BUN 11  CREATININE 2.81*    Microbiology: Results for orders placed or performed during the hospital encounter of 07/22/15  Culture, blood (routine x 2)     Status: None   Collection Time: 07/22/15  6:21 PM  Result Value Ref Range Status   Specimen Description BLOOD LEFT HAND  Final   Special Requests BOTTLES DRAWN AEROBIC AND ANAEROBIC  4CC  Final   Culture  Setup Time   Final    GRAM POSITIVE COCCI IN CLUSTERS AEROBIC BOTTLE ONLY CRITICAL RESULT CALLED TO, READ BACK BY AND VERIFIED WITH: CRYSTAL [facility] 07/23/15 1330 MLM    Culture   Final    METHICILLIN RESISTANT STAPHYLOCOCCUS AUREUS  AEROBIC BOTTLE ONLY    Report Status 07/27/2015 FINAL  Final   Organism ID, Bacteria METHICILLIN RESISTANT STAPHYLOCOCCUS AUREUS  Final      Susceptibility   Methicillin resistant staphylococcus aureus - MIC*    CIPROFLOXACIN >=8 RESISTANT Resistant     ERYTHROMYCIN <=0.25 SENSITIVE Sensitive     GENTAMICIN <=0.5 SENSITIVE Sensitive     OXACILLIN >=4 RESISTANT Resistant     VANCOMYCIN 1 SENSITIVE Sensitive     TRIMETH/SULFA <=10 SENSITIVE Sensitive     CLINDAMYCIN <=0.25 SENSITIVE Sensitive     CEFOXITIN SCREEN Value in next row Resistant      POSITIVECEFOXITIN SCREEN - This test may be used to predict mecA-mediated oxacillin resistance, and it is based on the cefoxitin disk screen test.  The cefoxitin screen and oxacillin work  in combination to determine the final interpretation reported for oxacillin.     Inducible Clindamycin Value in next row Sensitive      POSITIVECEFOXITIN SCREEN - This test may be used to predict mecA-mediated oxacillin resistance, and it is based on the cefoxitin disk screen test.  The cefoxitin screen and oxacillin work in combination to determine the final interpretation reported for oxacillin.     TETRACYCLINE Value in next row Sensitive      SENSITIVE<=1    * METHICILLIN RESISTANT STAPHYLOCOCCUS AUREUS  Culture, blood (routine x 2)     Status: None   Collection Time: 07/22/15  6:21 PM  Result Value Ref Range Status   Specimen Description BLOOD LEFT HAND  Final   Special Requests   Final    BOTTLES DRAWN AEROBIC AND ANAEROBIC  AER 2CC ANA 4CC   Culture  Setup Time   Final    GRAM POSITIVE COCCI IN CLUSTERS IN BOTH AEROBIC AND ANAEROBIC BOTTLES CRITICAL RESULT CALLED TO, READ BACK BY AND VERIFIED WITH: CRYSTAL SCARPENA 07/23/15 1400 MLM    Culture METHICILLIN RESISTANT STAPHYLOCOCCUS AUREUS  Final   Report Status 07/27/2015 FINAL  Final   Organism ID, Bacteria METHICILLIN RESISTANT STAPHYLOCOCCUS AUREUS  Final      Susceptibility   Methicillin resistant staphylococcus aureus - MIC*    CIPROFLOXACIN >=8 RESISTANT Resistant     ERYTHROMYCIN 0.5 SENSITIVE Sensitive     GENTAMICIN <=0.5 SENSITIVE Sensitive     OXACILLIN >=4 RESISTANT Resistant     VANCOMYCIN 1 SENSITIVE Sensitive     TRIMETH/SULFA <=10 SENSITIVE Sensitive     CLINDAMYCIN <=0.25 SENSITIVE Sensitive     CEFOXITIN SCREEN Value in next row Resistant      POSITIVECEFOXITIN SCREEN - This test may be used to predict mecA-mediated oxacillin resistance, and it is based on the cefoxitin disk screen test.  The cefoxitin screen and oxacillin work in combination to determine the final interpretation reported for oxacillin.     Inducible Clindamycin Value in next row Sensitive      POSITIVECEFOXITIN SCREEN - This test may be used to  predict mecA-mediated oxacillin resistance, and it is based on the cefoxitin disk screen test.  The cefoxitin screen and oxacillin work in combination to determine the final interpretation reported for oxacillin.     TETRACYCLINE Value in next row Sensitive      SENSITIVE<=1    * METHICILLIN RESISTANT STAPHYLOCOCCUS AUREUS  Blood Culture ID Panel (Reflexed)     Status: Abnormal   Collection Time: 07/22/15  6:21 PM  Result Value Ref Range Status   Enterococcus species NOT DETECTED NOT DETECTED Final   Listeria monocytogenes NOT DETECTED NOT DETECTED  Final   Staphylococcus species DETECTED (A) NOT DETECTED Final   Staphylococcus aureus DETECTED (A) NOT DETECTED Final    Comment: CRITICAL RESULT CALLED TO, READ BACK BY AND VERIFIED WITH: CRYSTAL SCARPENA 07/23/15 1330 ABOUT STAPH SPECIES,STAPH AUREUS, AND MecA DETECTED MLM    Streptococcus species NOT DETECTED NOT DETECTED Final   Streptococcus agalactiae NOT DETECTED NOT DETECTED Final   Streptococcus pneumoniae NOT DETECTED NOT DETECTED Final   Streptococcus pyogenes NOT DETECTED NOT DETECTED Final   Acinetobacter baumannii NOT DETECTED NOT DETECTED Final   Enterobacteriaceae species NOT DETECTED NOT DETECTED Final   Enterobacter cloacae complex NOT DETECTED NOT DETECTED Final   Escherichia coli NOT DETECTED NOT DETECTED Final   Klebsiella oxytoca NOT DETECTED NOT DETECTED Final   Klebsiella pneumoniae NOT DETECTED NOT DETECTED Final   Proteus species NOT DETECTED NOT DETECTED Final   Serratia marcescens NOT DETECTED NOT DETECTED Final   Haemophilus influenzae NOT DETECTED NOT DETECTED Final   Neisseria meningitidis NOT DETECTED NOT DETECTED Final   Pseudomonas aeruginosa NOT DETECTED NOT DETECTED Final   Candida albicans NOT DETECTED NOT DETECTED Final   Candida glabrata NOT DETECTED NOT DETECTED Final   Candida krusei NOT DETECTED NOT DETECTED Final   Candida parapsilosis NOT DETECTED NOT DETECTED Final   Candida tropicalis NOT  DETECTED NOT DETECTED Final   Carbapenem resistance NOT DETECTED NOT DETECTED Final   Methicillin resistance DETECTED (A) NOT DETECTED Final   Vancomycin resistance NOT DETECTED NOT DETECTED Final  MRSA PCR Screening     Status: Abnormal   Collection Time: 07/23/15  5:24 AM  Result Value Ref Range Status   MRSA by PCR POSITIVE (A) NEGATIVE Final    Comment:        The GeneXpert MRSA Assay (FDA approved for NASAL specimens only), is one component of a comprehensive MRSA colonization surveillance program. It is not intended to diagnose MRSA infection nor to guide or monitor treatment for MRSA infections. CRITICAL RESULT CALLED TO, READ BACK BY AND VERIFIED WITH: ALISHA BABB ON 07/23/15 AT 0735 BY QSD   CULTURE, BLOOD (ROUTINE X 2) w Reflex to PCR ID Panel     Status: None (Preliminary result)   Collection Time: 07/25/15  6:08 AM  Result Value Ref Range Status   Specimen Description BLOOD LEFT AC  Final   Special Requests BOTTLES DRAWN AEROBIC AND ANAEROBIC ANA2ML AER3ML  Final   Culture NO GROWTH 3 DAYS  Final   Report Status PENDING  Incomplete  CULTURE, BLOOD (ROUTINE X 2) w Reflex to PCR ID Panel     Status: None (Preliminary result)   Collection Time: 07/25/15  6:08 AM  Result Value Ref Range Status   Specimen Description BLOOD LEFT WRIST  Final   Special Requests   Final    BOTTLES DRAWN AEROBIC AND ANAEROBIC ANA AER   Culture NO GROWTH 3 DAYS  Final   Report Status PENDING  Incomplete    Studies/Results:  Dg Chest 1 View  07/22/2015  CLINICAL DATA:  Pt with end stage renal disease. Pt to ED [facility] EMS [facility] c/o dialysis catheter having been pulled out approximately 5-6 inches. Pt is verbally unresponsive except to basic commands. EXAM: CHEST  1 VIEW COMPARISON:  06/24/2015 FINDINGS: The tunneled left IJ hemodialysis catheter has its tips in the proximal right atrium. No pneumothorax. Confluent Opacities in the left mid and lower lung probably  combination of effusion and adjacent parenchymal consolidation/ atelectasis. Right lung clear.  Heart size difficult to assess due to adjacent opacities. No effusion. Atheromatous aorta. IMPRESSION: 1. Left IJ dialysis catheter projects in expected location. 2. Left lower lung consolidation/atelectasis with effusion. Electronically Signed   By: Corlis Leak M.D.   On: 07/22/2015 14:35   Dg Hip Port Unilat With Pelvis 1v Right  07/23/2015  CLINICAL DATA:  Pt is in renal stage failure. Unable to transport downstairs so to be done portable. Best images obtained due to condition. Previous MR right hip on 11-25. Pt is complaining of pain and is unable to move in any way. EXAM: DG HIP (WITH OR WITHOUT PELVIS) 1V PORT RIGHT COMPARISON:  None. FINDINGS: Hips are located. No evidence of pelvic fracture or sacral fracture. Dedicated view of the RIGHT hip demonstrates no femoral neck fracture. IMPRESSION: No fracture or dislocation. Electronically Signed   By: Genevive Bi M.D.   On: 07/23/2015 14:45   Study Conclusions  - Left ventricle: There was mild concentric hypertrophy. Systolic function was normal. The estimated ejection fraction was in the range of 55% to 65%. - Aortic valve: Valve area (Vmax): 2.61 cm^2. - Mitral valve: There was mild regurgitation.  Assessment:  Sean Delgado. is a 82 y.o. male with ESRD on HD, admitted with lethargy and found to have MRSA bacteremia likely related to his neck line which had backed out some. He has LLL consolidation/atelectasis but per my discussion with him had no cough, sob.  He had another fever and some cough.  Unable to have permacath placed due to this. Recommendations Cont vanco Check cxr and sputum cx Add zosyn for possible HCAP Repeat bcx pending - if neg x 48 hrs can replace permacath Will need min 2 weeks IV vanco - can be given at HD Thank you very much for the consult. Will follow with you.  FITZGERALD, DAVID   07/28/2015, 3:39  PM

## 2015-07-28 NOTE — Progress Notes (Signed)
At 14:10 paged MD to notify of SBP 84 and HR still in 130's. Awaiting callback.  At 14:45 BP is 97/45. T 99.6. Pt asymptomatic, eating, conversing, HR in the 130's still - MD aware of this per page this am.

## 2015-07-29 LAB — CBC
HCT: 27.4 % — ABNORMAL LOW (ref 40.0–52.0)
Hemoglobin: 8.3 g/dL — ABNORMAL LOW (ref 13.0–18.0)
MCH: 26.1 pg (ref 26.0–34.0)
MCHC: 30.2 g/dL — AB (ref 32.0–36.0)
MCV: 86.6 fL (ref 80.0–100.0)
PLATELETS: 294 10*3/uL (ref 150–440)
RBC: 3.17 MIL/uL — ABNORMAL LOW (ref 4.40–5.90)
RDW: 22.7 % — AB (ref 11.5–14.5)
WBC: 15.3 10*3/uL — AB (ref 3.8–10.6)

## 2015-07-29 LAB — BASIC METABOLIC PANEL
Anion gap: 8 (ref 5–15)
BUN: 24 mg/dL — AB (ref 6–20)
CALCIUM: 8.3 mg/dL — AB (ref 8.9–10.3)
CO2: 29 mmol/L (ref 22–32)
CREATININE: 4.32 mg/dL — AB (ref 0.61–1.24)
Chloride: 107 mmol/L (ref 101–111)
GFR calc Af Amer: 14 mL/min — ABNORMAL LOW (ref >60)
GFR, EST NON AFRICAN AMERICAN: 12 mL/min — AB (ref >60)
Glucose, Bld: 116 mg/dL — ABNORMAL HIGH (ref 65–99)
Potassium: 4.8 mmol/L (ref 3.5–5.1)
SODIUM: 144 mmol/L (ref 135–145)

## 2015-07-29 LAB — VANCOMYCIN, TROUGH: VANCOMYCIN TR: 26 ug/mL — AB (ref 10–20)

## 2015-07-29 MED ORDER — METOPROLOL TARTRATE 1 MG/ML IV SOLN
5.0000 mg | Freq: Four times a day (QID) | INTRAVENOUS | Status: DC
  Administered 2015-07-29 – 2015-07-30 (×3): 5 mg via INTRAVENOUS
  Filled 2015-07-29 (×6): qty 5

## 2015-07-29 MED ORDER — VANCOMYCIN HCL IN DEXTROSE 750-5 MG/150ML-% IV SOLN
750.0000 mg | INTRAVENOUS | Status: DC
  Administered 2015-07-29 – 2015-08-03 (×3): 750 mg via INTRAVENOUS
  Filled 2015-07-29 (×3): qty 150

## 2015-07-29 MED ORDER — DILTIAZEM HCL ER COATED BEADS 240 MG PO CP24
240.0000 mg | ORAL_CAPSULE | Freq: Every day | ORAL | Status: DC
  Administered 2015-07-29 – 2015-08-03 (×4): 240 mg via ORAL
  Filled 2015-07-29 (×4): qty 1

## 2015-07-29 MED ORDER — METOPROLOL TARTRATE 25 MG PO TABS
25.0000 mg | ORAL_TABLET | Freq: Two times a day (BID) | ORAL | Status: DC
  Administered 2015-07-30 – 2015-08-04 (×6): 25 mg via ORAL
  Filled 2015-07-29 (×8): qty 1

## 2015-07-29 NOTE — Progress Notes (Signed)
Patient ID: Sean Delgado., male   DOB: 1933/11/04, 82 y.o.   MRN: [id number] Sean Delgado,Sean Delgado PROGRESS NOTE  Sean Delgado. Sean Delgado:[id number] DOB: [birth year] Sean Delgado: 07/22/2015 PCP: Sean Morton, MD  HPI/Subjective: Patient offers no complaints. Feels okay. Does not feel any palpitations.  Objective: Filed Vitals:   07/29/15 1200 07/29/15 1300  BP: 113/52 126/56  Pulse: 125 70  Temp:    Resp: 21 22   No intake or output data in Sean 24 hours ending 07/29/15 1558 Filed Weights   07/26/15 1338 07/27/15 1215 07/28/15 2149  Weight: 92.9 kg (204 lb 12.9 oz) 92.3 kg (203 lb 7.8 oz) 90.7 kg (199 lb 15.3 oz)    ROS: Review of Systems  Constitutional: Negative for fever and chills.  Eyes: Negative for blurred vision.  Respiratory: Negative for cough and shortness of breath.   Cardiovascular: Negative for chest pain.  Gastrointestinal: Negative for nausea, vomiting, abdominal pain, diarrhea and constipation.  Genitourinary: Negative for dysuria.  Musculoskeletal: Negative for joint pain.  Neurological: Negative for dizziness and headaches.   Exam: Physical Exam  HENT:  Nose: No mucosal edema.  Mouth/Throat: No oropharyngeal exudate or posterior oropharyngeal edema.  Eyes: Conjunctivae, EOM and lids are normal. Pupils are equal, round, and reactive to light.  Neck: No JVD present. Carotid bruit is not present. No edema present. No thyroid mass and no thyromegaly present.  Cardiovascular: S1 normal and S2 normal.  Exam reveals no gallop.   No murmur heard. Pulses:      Dorsalis pedis pulses are 2+ on Sean right side, and 2+ on Sean left side.  Respiratory: No respiratory distress. He has no wheezes. He has rhonchi in Sean right lower field and Sean left lower field. He has no rales.  GI: Soft. Bowel sounds are normal. There is no tenderness.  Musculoskeletal:       Right ankle: He exhibits swelling.       Left ankle: He exhibits swelling.  Lymphadenopathy:    He has no  cervical adenopathy.  Neurological: He is alert.  Skin: Skin is warm. No rash noted. Nails show no clubbing.  Psychiatric: He has a normal mood and affect.      Data Reviewed: Basic Metabolic Panel:  Recent Labs Lab 07/22/15 1628 07/23/15 0451 07/24/15 0505 07/24/15 1712 07/26/15 0537 07/27/15 1247 07/28/15 0456 07/29/15 0418  NA 138 137  --   --   --   --  143 144  K 2.4* 2.7* 4.1  --   --   --  2.8* 4.8  CL 99* 100*  --   --   --   --  103 107  CO2 24 27  --   --   --   --  32 29  GLUCOSE 163* 100*  --   --   --   --  99 116*  BUN 39* 42*  --   --   --   --  11 24*  CREATININE 5.67* 5.83*  --   --   --   --  2.81* 4.32*  CALCIUM 7.9* 7.5*  --   --   --   --  8.2* 8.3*  MG  --  1.9 2.4  --   --   --  2.0  --   PHOS  --   --   --  7.0* 5.9* 4.5  --   --    CBC:  Recent Labs Lab 07/23/15 0451 07/28/15 0456 07/29/15  0418  WBC 10.4 12.6* 15.3*  HGB 8.9* 8.2* 8.3*  HCT 29.5* 27.6* 27.4*  MCV 85.9 87.2 86.6  PLT 268 282 294   BNP (last 3 results)  Recent Labs  03/04/15 1212 04/08/15 1621 05/30/15 0844  BNP 560.0* 763.0* 724.0*   CBG:  Recent Labs Lab 07/28/15 2148  GLUCAP 118*    Recent Results (from Sean past 240 hour(s))  Culture, blood (routine x 2)     Status: None   Collection Time: 07/22/15  6:21 PM  Result Value Ref Range Status   Specimen Description BLOOD LEFT HAND  Final   Special Requests BOTTLES DRAWN AEROBIC AND ANAEROBIC  4CC  Final   Culture  Setup Time   Final    GRAM POSITIVE COCCI IN CLUSTERS AEROBIC BOTTLE ONLY CRITICAL RESULT CALLED TO, READ BACK BY AND VERIFIED WITH: CRYSTAL SCARPENA 07/23/15 1330 MLM    Culture   Final    METHICILLIN RESISTANT STAPHYLOCOCCUS AUREUS AEROBIC BOTTLE ONLY    Report Status 07/27/2015 FINAL  Final   Organism ID, Bacteria METHICILLIN RESISTANT STAPHYLOCOCCUS AUREUS  Final      Susceptibility   Methicillin resistant staphylococcus aureus - MIC*    CIPROFLOXACIN >=8 RESISTANT Resistant      ERYTHROMYCIN <=0.25 SENSITIVE Sensitive     GENTAMICIN <=0.5 SENSITIVE Sensitive     OXACILLIN >=4 RESISTANT Resistant     VANCOMYCIN 1 SENSITIVE Sensitive     TRIMETH/SULFA <=10 SENSITIVE Sensitive     CLINDAMYCIN <=0.25 SENSITIVE Sensitive     CEFOXITIN SCREEN Value in next row Resistant      POSITIVECEFOXITIN SCREEN - This test may be used to predict mecA-mediated oxacillin resistance, and it is based on Sean cefoxitin disk screen test.  Sean cefoxitin screen and oxacillin work in combination to determine Sean final interpretation reported for oxacillin.     Inducible Clindamycin Value in next row Sensitive      POSITIVECEFOXITIN SCREEN - This test may be used to predict mecA-mediated oxacillin resistance, and it is based on Sean cefoxitin disk screen test.  Sean cefoxitin screen and oxacillin work in combination to determine Sean final interpretation reported for oxacillin.     TETRACYCLINE Value in next row Sensitive      SENSITIVE<=1    * METHICILLIN RESISTANT STAPHYLOCOCCUS AUREUS  Culture, blood (routine x 2)     Status: None   Collection Time: 07/22/15  6:21 PM  Result Value Ref Range Status   Specimen Description BLOOD LEFT HAND  Final   Special Requests   Final    BOTTLES DRAWN AEROBIC AND ANAEROBIC  AER 2CC ANA 4CC   Culture  Setup Time   Final    GRAM POSITIVE COCCI IN CLUSTERS IN BOTH AEROBIC AND ANAEROBIC BOTTLES CRITICAL RESULT CALLED TO, READ BACK BY AND VERIFIED WITH: CRYSTAL SCARPENA 07/23/15 1400 MLM    Culture METHICILLIN RESISTANT STAPHYLOCOCCUS AUREUS  Final   Report Status 07/27/2015 FINAL  Final   Organism ID, Bacteria METHICILLIN RESISTANT STAPHYLOCOCCUS AUREUS  Final      Susceptibility   Methicillin resistant staphylococcus aureus - MIC*    CIPROFLOXACIN >=8 RESISTANT Resistant     ERYTHROMYCIN 0.5 SENSITIVE Sensitive     GENTAMICIN <=0.5 SENSITIVE Sensitive     OXACILLIN >=4 RESISTANT Resistant     VANCOMYCIN 1 SENSITIVE Sensitive     TRIMETH/SULFA <=10  SENSITIVE Sensitive     CLINDAMYCIN <=0.25 SENSITIVE Sensitive     CEFOXITIN SCREEN Value in next row Resistant  POSITIVECEFOXITIN SCREEN - This test may be used to predict mecA-mediated oxacillin resistance, and it is based on Sean cefoxitin disk screen test.  Sean cefoxitin screen and oxacillin work in combination to determine Sean final interpretation reported for oxacillin.     Inducible Clindamycin Value in next row Sensitive      POSITIVECEFOXITIN SCREEN - This test may be used to predict mecA-mediated oxacillin resistance, and it is based on Sean cefoxitin disk screen test.  Sean cefoxitin screen and oxacillin work in combination to determine Sean final interpretation reported for oxacillin.     TETRACYCLINE Value in next row Sensitive      SENSITIVE<=1    * METHICILLIN RESISTANT STAPHYLOCOCCUS AUREUS  Blood Culture ID Panel (Reflexed)     Status: Abnormal   Collection Time: 07/22/15  6:21 PM  Result Value Ref Range Status   Enterococcus species NOT DETECTED NOT DETECTED Final   Listeria monocytogenes NOT DETECTED NOT DETECTED Final   Staphylococcus species DETECTED (A) NOT DETECTED Final   Staphylococcus aureus DETECTED (A) NOT DETECTED Final    Comment: CRITICAL RESULT CALLED TO, READ BACK BY AND VERIFIED WITH: CRYSTAL SCARPENA 07/23/15 1330 ABOUT STAPH SPECIES,STAPH AUREUS, AND MecA DETECTED MLM    Streptococcus species NOT DETECTED NOT DETECTED Final   Streptococcus agalactiae NOT DETECTED NOT DETECTED Final   Streptococcus pneumoniae NOT DETECTED NOT DETECTED Final   Streptococcus pyogenes NOT DETECTED NOT DETECTED Final   Acinetobacter baumannii NOT DETECTED NOT DETECTED Final   Enterobacteriaceae species NOT DETECTED NOT DETECTED Final   Enterobacter cloacae complex NOT DETECTED NOT DETECTED Final   Escherichia coli NOT DETECTED NOT DETECTED Final   Klebsiella oxytoca NOT DETECTED NOT DETECTED Final   Klebsiella pneumoniae NOT DETECTED NOT DETECTED Final   Proteus species NOT  DETECTED NOT DETECTED Final   Serratia marcescens NOT DETECTED NOT DETECTED Final   Haemophilus influenzae NOT DETECTED NOT DETECTED Final   Neisseria meningitidis NOT DETECTED NOT DETECTED Final   Pseudomonas aeruginosa NOT DETECTED NOT DETECTED Final   Candida albicans NOT DETECTED NOT DETECTED Final   Candida glabrata NOT DETECTED NOT DETECTED Final   Candida krusei NOT DETECTED NOT DETECTED Final   Candida parapsilosis NOT DETECTED NOT DETECTED Final   Candida tropicalis NOT DETECTED NOT DETECTED Final   Carbapenem resistance NOT DETECTED NOT DETECTED Final   Methicillin resistance DETECTED (A) NOT DETECTED Final   Vancomycin resistance NOT DETECTED NOT DETECTED Final  MRSA PCR Screening     Status: Abnormal   Collection Time: 07/23/15  5:24 AM  Result Value Ref Range Status   MRSA by PCR POSITIVE (A) NEGATIVE Final    Comment:        Sean GeneXpert MRSA Assay (FDA approved for NASAL specimens only), is one component of a comprehensive MRSA colonization surveillance program. It is not intended to diagnose MRSA infection nor to guide or monitor treatment for MRSA infections. CRITICAL RESULT CALLED TO, READ BACK BY AND VERIFIED WITH: ALISHA BABB ON 07/23/15 AT 0735 BY QSD   CULTURE, BLOOD (ROUTINE X 2) w Reflex to PCR ID Panel     Status: None (Preliminary result)   Collection Time: 07/25/15  6:08 AM  Result Value Ref Range Status   Specimen Description BLOOD LEFT AC  Final   Special Requests BOTTLES DRAWN AEROBIC AND ANAEROBIC ANA2ML AER3ML  Final   Culture NO GROWTH 4 DAYS  Final   Report Status PENDING  Incomplete  CULTURE, BLOOD (ROUTINE X 2) w Reflex to PCR  ID Panel     Status: None (Preliminary result)   Collection Time: 07/25/15  6:08 AM  Result Value Ref Range Status   Specimen Description BLOOD LEFT WRIST  Final   Special Requests   Final    BOTTLES DRAWN AEROBIC AND ANAEROBIC ANA AER   Culture NO GROWTH 4 DAYS  Final   Report Status PENDING  Incomplete   Cath Tip Culture     Status: None (Preliminary result)   Collection Time: 07/28/15  7:32 PM  Result Value Ref Range Status   Specimen Description CATH TIP  Final   Special Requests NONE  Final   Culture NO GROWTH < 24 HOURS  Final   Report Status PENDING  Incomplete     Studies: Dg Chest 1 View  07/28/2015  CLINICAL DATA:  82 year old male with chest pain and shortness of breath EXAM: CHEST 1 VIEW COMPARISON:  Radiograph dated 07/28/2015 FINDINGS: Single-view of Sean chest demonstrates cardiomegaly. Sean lungs are hypovolemic. There bilateral lower lobe opacities with no significant interval change compared to prior study. There is no pneumothorax. No acute osseous pathology. IMPRESSION: No significant interval change. Bilateral lower lung field airspace opacities, right greater than left. Electronically Signed   By: Elgie Collard M.D.   On: 07/28/2015 21:12   Dg Chest 1 View  07/28/2015  CLINICAL DATA:  Cough. EXAM: CHEST 1 VIEW COMPARISON:  07/22/2015 FINDINGS: Bilateral lower lobe airspace opacities are noted, increasing on Sean right since prior study, similar on Sean left. Small bilateral pleural effusions. Cardiomegaly with vascular congestion. IMPRESSION: Bilateral lower lobe airspace opacities, worsening on Sean right. Cannot exclude pneumonia. Bilateral effusions. Mild cardiomegaly. Electronically Signed   By: Charlett Nose M.D.   On: 07/28/2015 16:46    Scheduled Meds: . allopurinol  100 mg Oral Daily  . aspirin EC  81 mg Oral Daily  . atorvastatin  20 mg Oral Daily  . clopidogrel  75 mg Oral Daily  . diltiazem  240 mg Oral Daily  . epoetin (EPOGEN/PROCRIT) injection  10,000 Units Intravenous Q T,Th,Sa-HD  . ferrous sulfate  325 mg Oral Daily  . heparin  5,000 Units Subcutaneous 3 times per day  . latanoprost  1 drop Both Eyes QHS  . metoprolol  5 mg Intravenous 4 times per day  . metoprolol tartrate  25 mg Oral BID  . piperacillin-tazobactam (ZOSYN)  IV  3.375 g Intravenous  Q12H  . pregabalin  75 mg Oral Daily  . senna  17.2 mg Oral QHS  . vancomycin  1,000 mg Intravenous Q T,Th,Sa-HD   Assessment/Plan:  1. Atrial fibrillation with rapid ventricular response. Increase Cardizem CD 280 mg daily. Add metoprolol 25 mg twice a day to try to control heart rate better prior to getting out of Sean ICU. Blood pressure borderline low. Patient on aspirin and Plavix for anticoagulation 2. MRSA sepsis- patient on IV vancomycin 3. Pneumonia- unclear whether MRSA or aspiration still on Zosyn 4. End-stage renal disease on hemodialysis as per nephrology 5. Anemia of chronic disease on ferrous sulfate, Epogen with dialysis 6. Neuropathy on Lyrica 7. Glaucoma on latanoprost 8. Hyperlipidemia unspecified on atorvastatin 9. History of stroke  Code Status:     Code Status Orders        Start     Ordered   07/22/15 2038  Full code   Continuous     07/22/15 2038    Code Status History    Date Active Date Inactive Code Status Order  ID Comments User Context   06/24/2015  4:07 PM 06/27/2015 12:37 AM Full Code [id number]  Auburn Bilberry, MD Inpatient   05/30/2015  5:22 PM 06/05/2015  6:42 PM Full Code [id number]  Trudee Kuster, RN Inpatient   05/30/2015  1:59 PM 05/30/2015  5:22 PM DNR [id number]  Shaune Pollack, MD Inpatient   05/29/2015  3:57 PM 05/29/2015  7:32 PM Full Code [id number]  Annice Needy, MD Inpatient   04/08/2015  8:31 PM 04/17/2015 11:42 PM Full Code [id number]  Gracelyn Nurse, MD Inpatient   03/04/2015  4:13 PM 03/11/2015  7:00 PM DNR [id number]  Houston Siren, MD Inpatient     Disposition Plan: To be determined  Consultants:  Nephrology  Vascular surgery  Procedures:  Left IJ permacath removed with sepsis  Temporary dialysis catheter placed  Antibiotics:  Vancomycin  Zosyn  Time spent: 25 minutes  Alford Highland  [facility]

## 2015-07-29 NOTE — Progress Notes (Signed)
Pharmacy Antibiotic Follow-up Note  Sean Delgado. is a 82 y.o. year-old male admitted on 07/22/2015.  The patient is currently on day 6 of Vancomycin   for MRSA bacteremia and aspiration pneumonia.  Assessment/Plan: Patient received HD on 1/16, 1/18 and today on 1/19.  No Vancomycin doses administered per MAR.  Spoke with HD RN and no Vancomycin was given today after HD.  Will order Vancomycin 1 gm IV once to be given as supplemental dose after HD. Trough prior to HD on 1/21.  1/20: Vancomycin trough level scheduled to be drawn prior to dialysis on 1/21.l    Temp (24hrs), Avg:99 F (37.2 C), Min:98.8 F (37.1 C), Max:99.1 F (37.3 C)   Recent Labs Lab 07/23/15 0451 07/28/15 0456 07/29/15 0418  WBC 10.4 12.6* 15.3*     Recent Labs Lab 07/23/15 0451 07/28/15 0456 07/29/15 0418  CREATININE 5.83* 2.81* 4.32*   Estimated Creatinine Clearance: 15.7 mL/min (by C-G formula based on Cr of 4.32).    No Known Allergies  Antimicrobials this admission: Vanc 1/14 >> Unasyn 1/16 >>1/17 Zosyn 1/14 >> 1/16   Levels/dose changes this admission:   Microbiology results: 1/14 BCx: Staph Aureus mecA gene positive  1/14 MRSA PCR: positive  1/21:  Vancomycin trough = 26 mcg/mL Will decrease maintenance dose to Vancomycin 750 mg IV Q MWF -HD.  Will recheck Vanc trough before next 3rd HD session  on 1/28.   Dev Dhondt D PharmD 07/29/2015 8:48 PM

## 2015-07-29 NOTE — Progress Notes (Signed)
For dialysis.  Contacted Dr Sharlot Gowda about sending Pt down to dialysis rather than having it done in room.  She said OK to send him down.

## 2015-07-29 NOTE — Progress Notes (Signed)
Nutrition Follow-up  DOCUMENTATION CODES:   Non-severe (moderate) malnutrition in context of chronic illness  INTERVENTION:   Meals and Snacks: Cater to patient preferences; SLP following Medical Food Supplement Therapy: will continue Honey thick Mighty Shakes and Magic Cups on meal trays for added nutrition (each supplement provides approximately 300kcals and 9g protein)   NUTRITION DIAGNOSIS:   Increased nutrient needs related to wound healing, chronic illness as evidenced by estimated needs.  GOAL:   Patient will meet greater than or equal to 90% of their needs; ongoing  MONITOR:    (Energy intake, Electrolyte and renal profile)  REASON FOR ASSESSMENT:    (diet order)    ASSESSMENT:   Per MD note, IJ permcath appears to have been completely dislodged. Temp right femoral HD catheter removed and replaced 07/28/15. Pt moved to ICU for afib/RVR. Pt scheduled for HD again today. Pt remains on isolation for MRSA.   Diet Order:  Diet - low sodium heart healthy DIET - DYS 1 Room service appropriate?: Yes; Fluid consistency:: Honey Thick    Current Nutrition: Pt ate 100% of waffle this am with 100% of honey thick Mighty Shake, Milk and orange juice. CNA reports cream of wheat was a bit too thin, so she did not offer that after a sip. Recorded po intake 75-80% of meals eaten on 1/18 and 50% of lunch yesterday. Limited documentation otherwise as pt on isolation.   Gastrointestinal Profile: Last BM: 07/29/2015   Scheduled Medications:  . allopurinol  100 mg Oral Daily  . aspirin EC  81 mg Oral Daily  . atorvastatin  20 mg Oral Daily  . clopidogrel  75 mg Oral Daily  . diltiazem  240 mg Oral Daily  . epoetin (EPOGEN/PROCRIT) injection  10,000 Units Intravenous Q T,Th,Sa-HD  . ferrous sulfate  325 mg Oral Daily  . heparin  5,000 Units Subcutaneous 3 times per day  . latanoprost  1 drop Both Eyes QHS  . metoprolol  5 mg Intravenous 4 times per day  . piperacillin-tazobactam  (ZOSYN)  IV  3.375 g Intravenous Q12H  . pregabalin  75 mg Oral Daily  . senna  17.2 mg Oral QHS  . vancomycin  1,000 mg Intravenous Q T,Th,Sa-HD     Electrolyte/Renal Profile and Glucose Profile:   Recent Labs Lab 07/23/15 0451 07/24/15 0505 07/24/15 1712 07/26/15 0537 07/27/15 1247 07/28/15 0456 07/29/15 0418  NA 137  --   --   --   --  143 144  K 2.7* 4.1  --   --   --  2.8* 4.8  CL 100*  --   --   --   --  103 107  CO2 27  --   --   --   --  32 29  BUN 42*  --   --   --   --  11 24*  CREATININE 5.83*  --   --   --   --  2.81* 4.32*  CALCIUM 7.5*  --   --   --   --  8.2* 8.3*  MG 1.9 2.4  --   --   --  2.0  --   PHOS  --   --  7.0* 5.9* 4.5  --   --   GLUCOSE 100*  --   --   --   --  99 116*   Protein Profile: No results for input(s): ALBUMIN in the last 168 hours.    Weight Trend since Admission: Filed  Weights   07/26/15 1338 07/27/15 1215 07/28/15 2149  Weight: 204 lb 12.9 oz (92.9 kg) 203 lb 7.8 oz (92.3 kg) 199 lb 15.3 oz (90.7 kg)     BMI:  Body mass index is 26.39 kg/(m^2).  Estimated Nutritional Needs:   Kcal:  BEE 1688 kcals (IF 1.1-1.3, AF 1.2) 1610-9604 kcals/d.   Protein:  (1.2-1.5 g/kg) 110-138 g/d  Fluid:  (1000 + UOP)  EDUCATION NEEDS:   No education needs identified at this time   MODERATE Care Level  Leda Quail, RD, LDN Pager [phone] Weekend/On-Call Pager [phone]

## 2015-07-29 NOTE — Progress Notes (Signed)
Subjective:  Patient had temporary HD catheter removed and replaced. Had fever yesterday. Developed tachycardia and Afib overnight and moved to CCU. Due for HD today.   Objective:  Vital signs in last 24 hours:  Temp:  [99.1 F (37.3 C)-99.6 F (37.6 C)] 99.1 F (37.3 C) (01/20 2149) Pulse Rate:  [77-138] 111 (01/21 0900) Resp:  [19-26] 23 (01/21 0900) BP: (80-135)/(32-83) 84/49 mmHg (01/21 0900) SpO2:  [76 %-95 %] 76 % (01/21 0900) Weight:  [90.7 kg (199 lb 15.3 oz)] 90.7 kg (199 lb 15.3 oz) (01/20 2149)  Weight change: -1.6 kg (-3 lb 8.4 oz) Filed Weights   07/26/15 1338 07/27/15 1215 07/28/15 2149  Weight: 92.9 kg (204 lb 12.9 oz) 92.3 kg (203 lb 7.8 oz) 90.7 kg (199 lb 15.3 oz)    Intake/Output:    Intake/Output Summary (Last 24 hours) at 07/29/15 1101 Last data filed at 07/28/15 1300  Gross per 24 hour  Intake    200 ml  Output      0 ml  Net    200 ml     Physical Exam: General:  no acute distress, laying in the bed   HEENT  anicteric, moist oral mucous membranes   Neck  supple   Pulm/lungs  clear to auscultation bilateral, normal effort  CVS/Heart  S1S2 irregular  Abdomen:   soft, nontender, nondistended   Extremities:  no peripheral edema   Neurologic:  alert, oriented   Skin:  no acute rashes   Access:  temporary right femoral dialysis catheter        Basic Metabolic Panel:   Recent Labs Lab 07/22/15 1506 07/22/15 1628 07/23/15 0451 07/24/15 0505 07/24/15 1712 07/26/15 0537 07/27/15 1247 07/28/15 0456 07/29/15 0418  NA 135 138 137  --   --   --   --  143 144  K 2.3* 2.4* 2.7* 4.1  --   --   --  2.8* 4.8  CL 98* 99* 100*  --   --   --   --  103 107  CO2 --   --   --   --  32 29  GLUCOSE 220* 163* 100*  --   --   --   --  99 116*  BUN 38* 39* 42*  --   --   --   --  11 24*  CREATININE 5.53* 5.67* 5.83*  --   --   --   --  2.81* 4.32*  CALCIUM 7.7* 7.9* 7.5*  --   --   --   --  8.2* 8.3*  MG  --   --  1.9 2.4  --   --   --   2.0  --   PHOS  --   --   --   --  7.0* 5.9* 4.5  --   --      CBC:  Recent Labs Lab 07/22/15 1506 07/23/15 0451 07/28/15 0456 07/29/15 0418  WBC 12.9* 10.4 12.6* 15.3*  NEUTROABS 10.9*  --   --   --   HGB 8.6* 8.9* 8.2* 8.3*  HCT 28.7* 29.5* 27.6* 27.4*  MCV 82.3 85.9 87.2 86.6  PLT 304 268 282 294      Microbiology:  Recent Results (from the past 720 hour(s))  Culture, blood (routine x 2)     Status: None   Collection Time: 07/22/15  6:21 PM  Result Value Ref Range Status   Specimen Description BLOOD LEFT HAND  Final   Special Requests BOTTLES DRAWN AEROBIC AND ANAEROBIC  4CC  Final   Culture  Setup Time   Final    GRAM POSITIVE COCCI IN CLUSTERS AEROBIC BOTTLE ONLY CRITICAL RESULT CALLED TO, READ BACK BY AND VERIFIED WITH: CRYSTAL [facility] 07/23/15 1330 MLM    Culture   Final    METHICILLIN RESISTANT STAPHYLOCOCCUS AUREUS AEROBIC BOTTLE ONLY    Report Status 07/27/2015 FINAL  Final   Organism ID, Bacteria METHICILLIN RESISTANT STAPHYLOCOCCUS AUREUS  Final      Susceptibility   Methicillin resistant staphylococcus aureus - MIC*    CIPROFLOXACIN >=8 RESISTANT Resistant     ERYTHROMYCIN <=0.25 SENSITIVE Sensitive     GENTAMICIN <=0.5 SENSITIVE Sensitive     OXACILLIN >=4 RESISTANT Resistant     VANCOMYCIN 1 SENSITIVE Sensitive     TRIMETH/SULFA <=10 SENSITIVE Sensitive     CLINDAMYCIN <=0.25 SENSITIVE Sensitive     CEFOXITIN SCREEN Value in next row Resistant      POSITIVECEFOXITIN SCREEN - This test may be used to predict mecA-mediated oxacillin resistance, and it is based on the cefoxitin disk screen test.  The cefoxitin screen and oxacillin work in combination to determine the final interpretation reported for oxacillin.     Inducible Clindamycin Value in next row Sensitive      POSITIVECEFOXITIN SCREEN - This test may be used to predict mecA-mediated oxacillin resistance, and it is based on the cefoxitin disk screen test.  The cefoxitin screen and oxacillin  work in combination to determine the final interpretation reported for oxacillin.     TETRACYCLINE Value in next row Sensitive      SENSITIVE<=1    * METHICILLIN RESISTANT STAPHYLOCOCCUS AUREUS  Culture, blood (routine x 2)     Status: None   Collection Time: 07/22/15  6:21 PM  Result Value Ref Range Status   Specimen Description BLOOD LEFT HAND  Final   Special Requests   Final    BOTTLES DRAWN AEROBIC AND ANAEROBIC  AER 2CC ANA 4CC   Culture  Setup Time   Final    GRAM POSITIVE COCCI IN CLUSTERS IN BOTH AEROBIC AND ANAEROBIC BOTTLES CRITICAL RESULT CALLED TO, READ BACK BY AND VERIFIED WITH: CRYSTAL SCARPENA 07/23/15 1400 MLM    Culture METHICILLIN RESISTANT STAPHYLOCOCCUS AUREUS  Final   Report Status 07/27/2015 FINAL  Final   Organism ID, Bacteria METHICILLIN RESISTANT STAPHYLOCOCCUS AUREUS  Final      Susceptibility   Methicillin resistant staphylococcus aureus - MIC*    CIPROFLOXACIN >=8 RESISTANT Resistant     ERYTHROMYCIN 0.5 SENSITIVE Sensitive     GENTAMICIN <=0.5 SENSITIVE Sensitive     OXACILLIN >=4 RESISTANT Resistant     VANCOMYCIN 1 SENSITIVE Sensitive     TRIMETH/SULFA <=10 SENSITIVE Sensitive     CLINDAMYCIN <=0.25 SENSITIVE Sensitive     CEFOXITIN SCREEN Value in next row Resistant      POSITIVECEFOXITIN SCREEN - This test may be used to predict mecA-mediated oxacillin resistance, and it is based on the cefoxitin disk screen test.  The cefoxitin screen and oxacillin work in combination to determine the final interpretation reported for oxacillin.     Inducible Clindamycin Value in next row Sensitive      POSITIVECEFOXITIN SCREEN - This test may be used to predict mecA-mediated oxacillin resistance, and it is based on the cefoxitin disk screen test.  The cefoxitin screen and oxacillin work in combination to determine the final interpretation reported for oxacillin.     TETRACYCLINE  Value in next row Sensitive      SENSITIVE<=1    * METHICILLIN RESISTANT STAPHYLOCOCCUS  AUREUS  Blood Culture ID Panel (Reflexed)     Status: Abnormal   Collection Time: 07/22/15  6:21 PM  Result Value Ref Range Status   Enterococcus species NOT DETECTED NOT DETECTED Final   Listeria monocytogenes NOT DETECTED NOT DETECTED Final   Staphylococcus species DETECTED (A) NOT DETECTED Final   Staphylococcus aureus DETECTED (A) NOT DETECTED Final    Comment: CRITICAL RESULT CALLED TO, READ BACK BY AND VERIFIED WITH: CRYSTAL SCARPENA 07/23/15 1330 ABOUT STAPH SPECIES,STAPH AUREUS, AND MecA DETECTED MLM    Streptococcus species NOT DETECTED NOT DETECTED Final   Streptococcus agalactiae NOT DETECTED NOT DETECTED Final   Streptococcus pneumoniae NOT DETECTED NOT DETECTED Final   Streptococcus pyogenes NOT DETECTED NOT DETECTED Final   Acinetobacter baumannii NOT DETECTED NOT DETECTED Final   Enterobacteriaceae species NOT DETECTED NOT DETECTED Final   Enterobacter cloacae complex NOT DETECTED NOT DETECTED Final   Escherichia coli NOT DETECTED NOT DETECTED Final   Klebsiella oxytoca NOT DETECTED NOT DETECTED Final   Klebsiella pneumoniae NOT DETECTED NOT DETECTED Final   Proteus species NOT DETECTED NOT DETECTED Final   Serratia marcescens NOT DETECTED NOT DETECTED Final   Haemophilus influenzae NOT DETECTED NOT DETECTED Final   Neisseria meningitidis NOT DETECTED NOT DETECTED Final   Pseudomonas aeruginosa NOT DETECTED NOT DETECTED Final   Candida albicans NOT DETECTED NOT DETECTED Final   Candida glabrata NOT DETECTED NOT DETECTED Final   Candida krusei NOT DETECTED NOT DETECTED Final   Candida parapsilosis NOT DETECTED NOT DETECTED Final   Candida tropicalis NOT DETECTED NOT DETECTED Final   Carbapenem resistance NOT DETECTED NOT DETECTED Final   Methicillin resistance DETECTED (A) NOT DETECTED Final   Vancomycin resistance NOT DETECTED NOT DETECTED Final  MRSA PCR Screening     Status: Abnormal   Collection Time: 07/23/15  5:24 AM  Result Value Ref Range Status   MRSA by PCR  POSITIVE (A) NEGATIVE Final    Comment:        The GeneXpert MRSA Assay (FDA approved for NASAL specimens only), is one component of a comprehensive MRSA colonization surveillance program. It is not intended to diagnose MRSA infection nor to guide or monitor treatment for MRSA infections. CRITICAL RESULT CALLED TO, READ BACK BY AND VERIFIED WITH: ALISHA BABB ON 07/23/15 AT 0735 BY QSD   CULTURE, BLOOD (ROUTINE X 2) w Reflex to PCR ID Panel     Status: None (Preliminary result)   Collection Time: 07/25/15  6:08 AM  Result Value Ref Range Status   Specimen Description BLOOD LEFT AC  Final   Special Requests BOTTLES DRAWN AEROBIC AND ANAEROBIC ANA2ML AER3ML  Final   Culture NO GROWTH 4 DAYS  Final   Report Status PENDING  Incomplete  CULTURE, BLOOD (ROUTINE X 2) w Reflex to PCR ID Panel     Status: None (Preliminary result)   Collection Time: 07/25/15  6:08 AM  Result Value Ref Range Status   Specimen Description BLOOD LEFT WRIST  Final   Special Requests   Final    BOTTLES DRAWN AEROBIC AND ANAEROBIC ANA AER   Culture NO GROWTH 4 DAYS  Final   Report Status PENDING  Incomplete    Coagulation Studies: No results for input(s): LABPROT, INR in the last 72 hours.  Urinalysis: No results for input(s): COLORURINE, LABSPEC, PHURINE, GLUCOSEU, HGBUR, BILIRUBINUR, KETONESUR, PROTEINUR, UROBILINOGEN, NITRITE,  LEUKOCYTESUR in the last 72 hours.  Invalid input(s): APPERANCEUR    Imaging: Dg Chest 1 View  07/28/2015  CLINICAL DATA:  82 year old male with chest pain and shortness of breath EXAM: CHEST 1 VIEW COMPARISON:  Radiograph dated 07/28/2015 FINDINGS: Single-view of the chest demonstrates cardiomegaly. The lungs are hypovolemic. There bilateral lower lobe opacities with no significant interval change compared to prior study. There is no pneumothorax. No acute osseous pathology. IMPRESSION: No significant interval change. Bilateral lower lung field airspace opacities, right  greater than left. Electronically Signed   By: Elgie Collard M.D.   On: 07/28/2015 21:12   Dg Chest 1 View  07/28/2015  CLINICAL DATA:  Cough. EXAM: CHEST 1 VIEW COMPARISON:  07/22/2015 FINDINGS: Bilateral lower lobe airspace opacities are noted, increasing on the right since prior study, similar on the left. Small bilateral pleural effusions. Cardiomegaly with vascular congestion. IMPRESSION: Bilateral lower lobe airspace opacities, worsening on the right. Cannot exclude pneumonia. Bilateral effusions. Mild cardiomegaly. Electronically Signed   By: Charlett Nose M.D.   On: 07/28/2015 16:46     Medications:     . allopurinol  100 mg Oral Daily  . aspirin EC  81 mg Oral Daily  . atorvastatin  20 mg Oral Daily  . clopidogrel  75 mg Oral Daily  . diltiazem  240 mg Oral Daily  . epoetin (EPOGEN/PROCRIT) injection  10,000 Units Intravenous Q T,Th,Sa-HD  . ferrous sulfate  325 mg Oral Daily  . heparin  5,000 Units Subcutaneous 3 times per day  . latanoprost  1 drop Both Eyes QHS  . metoprolol  5 mg Intravenous 4 times per day  . piperacillin-tazobactam (ZOSYN)  IV  3.375 g Intravenous Q12H  . pregabalin  75 mg Oral Daily  . senna  17.2 mg Oral QHS  . vancomycin  1,000 mg Intravenous Q T,Th,Sa-HD   sodium chloride, sodium chloride, alteplase, benzonatate, heparin, lidocaine (PF), lidocaine-prilocaine, naLOXone (NARCAN)  injection, oxyCODONE-acetaminophen, pentafluoroprop-tetrafluoroeth  Assessment/ Plan:  82 y.o. african [place] male  with right eye blindness, diastolic congestive heart failure, hypertension, gout, glaucoma, peripheral neuropathy, atrial fibrillation, anemia, carotid stenosis status post bilateral CEA, CVA, peripheral vascular disease  [facility]/ [facility]TS  1. End Stage Renal Disease/MRSA sepsis:  IJ permcath appears to have completely dislodged. Temp right femoral HD catheter removed and replaced 07/28/15. -Pt due for HD today, had temp HD  catheter replaced yesterday.  UF target will be conservative today given Afib.  2. Anemia of chronic kidney disease: Hemoglobin 8.9 - continue Epogen 10,000 units IV with dialysis, hgb 8.3.  3. Secondary Hyperparathyroidism:  - check phos with HD today.  4. Hypokalemia -K now up to 4.8, prior to this it was 2.8, continue to monitor.     LOS: 7 Akshaya Toepfer 1/21/201711:01 AM

## 2015-07-29 NOTE — Progress Notes (Signed)
[facility]  Daily Progress Note   Subjective  -   Patient now in the intensive care unit, he was transferred late last night secondary to hemodynamic instability. Today he is more alert than he was yesterday and is responding to questions appropriately. He denies pain.  Objective Filed Vitals:   07/29/15 1000 07/29/15 1100 07/29/15 1200 07/29/15 1300  BP: 104/48 115/46 113/52 126/56  Pulse: 75 119 125 70  Temp:      TempSrc:      Resp: Height:      Weight:      SpO2: 93% 88% 91% 94%   No intake or output data in the 24 hours ending 07/29/15 1629  PULM  Normal effort , no use of accessory muscles CV  No JVD, RRR Abd      No distended, nontender VASC  right femoral catheter clean dry and intact  Laboratory CBC    Component Value Date/Time   WBC 15.3* 07/29/2015 0418   HGB 8.3* 07/29/2015 0418   HCT 27.4* 07/29/2015 0418   PLT 294 07/29/2015 0418    BMET    Component Value Date/Time   NA 144 07/29/2015 0418   K 4.8 07/29/2015 0418   CL 107 07/29/2015 0418   CO2 29 07/29/2015 0418   GLUCOSE 116* 07/29/2015 0418   BUN 24* 07/29/2015 0418   CREATININE 4.32* 07/29/2015 0418   CALCIUM 8.3* 07/29/2015 0418   GFRNONAA 12* 07/29/2015 0418   GFRAA 14* 07/29/2015 0418   Micro- catheter tip is no growth at 1 day  Assessment/Planning:  1. End-stage renal disease  Catheter changed yesterday tip clx is negative will plan tunneled catheter next week unless culture turns + on hemodialysis as per nephrology 2.  Atrial fibrillation Tx back to ICU better control today 3. Pneumonia- unclear whether MRSA or aspiration still on Zosyn 4. Anemia of chronic disease on ferrous sulfate, Epogen with dialysis 5. Neuropathy on Lyrica 6. Glaucoma on latanoprost 7. Hyperlipidemia unspecified on atorvastatin    Renford Dills  07/29/2015, 4:29 PM

## 2015-07-30 LAB — CULTURE, BLOOD (ROUTINE X 2)
CULTURE: NO GROWTH
CULTURE: NO GROWTH

## 2015-07-30 LAB — GLUCOSE, CAPILLARY: GLUCOSE-CAPILLARY: 94 mg/dL (ref 65–99)

## 2015-07-30 NOTE — Progress Notes (Signed)
Returned from dialysis.  Will continue to monitor.

## 2015-07-30 NOTE — Progress Notes (Signed)
Patient ID: Sean Greenup., male   DOB: May 02, 1934, 82 y.o.   MRN: [id number] [facility] PROGRESS NOTE  Sean Delgado. JJO:[id number] DOB: [birth year] DOA: 07/22/2015 PCP: Bobbye Morton, MD  HPI/Subjective: Patient offers no complaints. Feels okay. Does not feel any palpitations.  Objective: Filed Vitals:   07/30/15 0900 07/30/15 1000  BP: 96/39 95/41  Pulse: 99 49  Temp:    Resp: 21 23   No intake or output data in the 24 hours ending 07/30/15 1357 Filed Weights   07/26/15 1338 07/27/15 1215 07/28/15 2149  Weight: 92.9 kg (204 lb 12.9 oz) 92.3 kg (203 lb 7.8 oz) 90.7 kg (199 lb 15.3 oz)    ROS: Review of Systems  Constitutional: Negative for fever and chills.  Eyes: Negative for blurred vision.  Respiratory: Positive for cough. Negative for shortness of breath.   Cardiovascular: Negative for chest pain.  Gastrointestinal: Negative for nausea, vomiting, abdominal pain, diarrhea and constipation.  Genitourinary: Negative for dysuria.  Musculoskeletal: Negative for joint pain.  Neurological: Negative for dizziness and headaches.   Exam: Physical Exam  HENT:  Nose: No mucosal edema.  Mouth/Throat: No oropharyngeal exudate or posterior oropharyngeal edema.  Eyes: Conjunctivae, EOM and lids are normal. Pupils are equal, round, and reactive to light.  Neck: No JVD present. Carotid bruit is not present. No edema present. No thyroid mass and no thyromegaly present.  Cardiovascular: S1 normal and S2 normal.  An irregularly irregular rhythm present. Tachycardia present.  Exam reveals no gallop.   No murmur heard. Pulses:      Dorsalis pedis pulses are 2+ on the right side, and 2+ on the left side.  Respiratory: No respiratory distress. He has no wheezes. He has rhonchi in the right lower field and the left lower field. He has no rales.  GI: Soft. Bowel sounds are normal. There is no tenderness.  Musculoskeletal:       Right ankle: He exhibits swelling.        Left ankle: He exhibits swelling.  Lymphadenopathy:    He has no cervical adenopathy.  Neurological: He is alert.  Skin: Skin is warm. No rash noted. Nails show no clubbing.  Psychiatric: He has a normal mood and affect.      Data Reviewed: Basic Metabolic Panel:  Recent Labs Lab 07/24/15 0505 07/24/15 1712 07/26/15 0537 07/27/15 1247 07/28/15 0456 07/29/15 0418  NA  --   --   --   --  143 144  K 4.1  --   --   --  2.8* 4.8  CL  --   --   --   --  103 107  CO2  --   --   --   --  32 29  GLUCOSE  --   --   --   --  99 116*  BUN  --   --   --   --  11 24*  CREATININE  --   --   --   --  2.81* 4.32*  CALCIUM  --   --   --   --  8.2* 8.3*  MG 2.4  --   --   --  2.0  --   PHOS  --  7.0* 5.9* 4.5  --   --    CBC:  Recent Labs Lab 07/28/15 0456 07/29/15 0418  WBC 12.6* 15.3*  HGB 8.2* 8.3*  HCT 27.6* 27.4*  MCV 87.2 86.6  PLT 282 294  BNP (last 3 results)  Recent Labs  03/04/15 1212 04/08/15 1621 05/30/15 0844  BNP 560.0* 763.0* 724.0*   CBG:  Recent Labs Lab 07/28/15 2148  GLUCAP 118*    Recent Results (from the past 240 hour(s))  Culture, blood (routine x 2)     Status: None   Collection Time: 07/22/15  6:21 PM  Result Value Ref Range Status   Specimen Description BLOOD LEFT HAND  Final   Special Requests BOTTLES DRAWN AEROBIC AND ANAEROBIC  4CC  Final   Culture  Setup Time   Final    GRAM POSITIVE COCCI IN CLUSTERS AEROBIC BOTTLE ONLY CRITICAL RESULT CALLED TO, READ BACK BY AND VERIFIED WITH: CRYSTAL SCARPENA 07/23/15 1330 MLM    Culture   Final    METHICILLIN RESISTANT STAPHYLOCOCCUS AUREUS AEROBIC BOTTLE ONLY    Report Status 07/27/2015 FINAL  Final   Organism ID, Bacteria METHICILLIN RESISTANT STAPHYLOCOCCUS AUREUS  Final      Susceptibility   Methicillin resistant staphylococcus aureus - MIC*    CIPROFLOXACIN >=8 RESISTANT Resistant     ERYTHROMYCIN <=0.25 SENSITIVE Sensitive     GENTAMICIN <=0.5 SENSITIVE Sensitive     OXACILLIN >=4  RESISTANT Resistant     VANCOMYCIN 1 SENSITIVE Sensitive     TRIMETH/SULFA <=10 SENSITIVE Sensitive     CLINDAMYCIN <=0.25 SENSITIVE Sensitive     CEFOXITIN SCREEN Value in next row Resistant      POSITIVECEFOXITIN SCREEN - This test may be used to predict mecA-mediated oxacillin resistance, and it is based on the cefoxitin disk screen test.  The cefoxitin screen and oxacillin work in combination to determine the final interpretation reported for oxacillin.     Inducible Clindamycin Value in next row Sensitive      POSITIVECEFOXITIN SCREEN - This test may be used to predict mecA-mediated oxacillin resistance, and it is based on the cefoxitin disk screen test.  The cefoxitin screen and oxacillin work in combination to determine the final interpretation reported for oxacillin.     TETRACYCLINE Value in next row Sensitive      SENSITIVE<=1    * METHICILLIN RESISTANT STAPHYLOCOCCUS AUREUS  Culture, blood (routine x 2)     Status: None   Collection Time: 07/22/15  6:21 PM  Result Value Ref Range Status   Specimen Description BLOOD LEFT HAND  Final   Special Requests   Final    BOTTLES DRAWN AEROBIC AND ANAEROBIC  AER 2CC ANA 4CC   Culture  Setup Time   Final    GRAM POSITIVE COCCI IN CLUSTERS IN BOTH AEROBIC AND ANAEROBIC BOTTLES CRITICAL RESULT CALLED TO, READ BACK BY AND VERIFIED WITH: CRYSTAL SCARPENA 07/23/15 1400 MLM    Culture METHICILLIN RESISTANT STAPHYLOCOCCUS AUREUS  Final   Report Status 07/27/2015 FINAL  Final   Organism ID, Bacteria METHICILLIN RESISTANT STAPHYLOCOCCUS AUREUS  Final      Susceptibility   Methicillin resistant staphylococcus aureus - MIC*    CIPROFLOXACIN >=8 RESISTANT Resistant     ERYTHROMYCIN 0.5 SENSITIVE Sensitive     GENTAMICIN <=0.5 SENSITIVE Sensitive     OXACILLIN >=4 RESISTANT Resistant     VANCOMYCIN 1 SENSITIVE Sensitive     TRIMETH/SULFA <=10 SENSITIVE Sensitive     CLINDAMYCIN <=0.25 SENSITIVE Sensitive     CEFOXITIN SCREEN Value in next row  Resistant      POSITIVECEFOXITIN SCREEN - This test may be used to predict mecA-mediated oxacillin resistance, and it is based on the cefoxitin disk screen test.  The  cefoxitin screen and oxacillin work in combination to determine the final interpretation reported for oxacillin.     Inducible Clindamycin Value in next row Sensitive      POSITIVECEFOXITIN SCREEN - This test may be used to predict mecA-mediated oxacillin resistance, and it is based on the cefoxitin disk screen test.  The cefoxitin screen and oxacillin work in combination to determine the final interpretation reported for oxacillin.     TETRACYCLINE Value in next row Sensitive      SENSITIVE<=1    * METHICILLIN RESISTANT STAPHYLOCOCCUS AUREUS  Blood Culture ID Panel (Reflexed)     Status: Abnormal   Collection Time: 07/22/15  6:21 PM  Result Value Ref Range Status   Enterococcus species NOT DETECTED NOT DETECTED Final   Listeria monocytogenes NOT DETECTED NOT DETECTED Final   Staphylococcus species DETECTED (A) NOT DETECTED Final   Staphylococcus aureus DETECTED (A) NOT DETECTED Final    Comment: CRITICAL RESULT CALLED TO, READ BACK BY AND VERIFIED WITH: CRYSTAL SCARPENA 07/23/15 1330 ABOUT STAPH SPECIES,STAPH AUREUS, AND MecA DETECTED MLM    Streptococcus species NOT DETECTED NOT DETECTED Final   Streptococcus agalactiae NOT DETECTED NOT DETECTED Final   Streptococcus pneumoniae NOT DETECTED NOT DETECTED Final   Streptococcus pyogenes NOT DETECTED NOT DETECTED Final   Acinetobacter baumannii NOT DETECTED NOT DETECTED Final   Enterobacteriaceae species NOT DETECTED NOT DETECTED Final   Enterobacter cloacae complex NOT DETECTED NOT DETECTED Final   Escherichia coli NOT DETECTED NOT DETECTED Final   Klebsiella oxytoca NOT DETECTED NOT DETECTED Final   Klebsiella pneumoniae NOT DETECTED NOT DETECTED Final   Proteus species NOT DETECTED NOT DETECTED Final   Serratia marcescens NOT DETECTED NOT DETECTED Final   Haemophilus  influenzae NOT DETECTED NOT DETECTED Final   Neisseria meningitidis NOT DETECTED NOT DETECTED Final   Pseudomonas aeruginosa NOT DETECTED NOT DETECTED Final   Candida albicans NOT DETECTED NOT DETECTED Final   Candida glabrata NOT DETECTED NOT DETECTED Final   Candida krusei NOT DETECTED NOT DETECTED Final   Candida parapsilosis NOT DETECTED NOT DETECTED Final   Candida tropicalis NOT DETECTED NOT DETECTED Final   Carbapenem resistance NOT DETECTED NOT DETECTED Final   Methicillin resistance DETECTED (A) NOT DETECTED Final   Vancomycin resistance NOT DETECTED NOT DETECTED Final  MRSA PCR Screening     Status: Abnormal   Collection Time: 07/23/15  5:24 AM  Result Value Ref Range Status   MRSA by PCR POSITIVE (A) NEGATIVE Final    Comment:        The GeneXpert MRSA Assay (FDA approved for NASAL specimens only), is one component of a comprehensive MRSA colonization surveillance program. It is not intended to diagnose MRSA infection nor to guide or monitor treatment for MRSA infections. CRITICAL RESULT CALLED TO, READ BACK BY AND VERIFIED WITH: ALISHA BABB ON 07/23/15 AT 0735 BY QSD   CULTURE, BLOOD (ROUTINE X 2) w Reflex to PCR ID Panel     Status: None   Collection Time: 07/25/15  6:08 AM  Result Value Ref Range Status   Specimen Description BLOOD LEFT AC  Final   Special Requests BOTTLES DRAWN AEROBIC AND ANAEROBIC ANA2ML AER3ML  Final   Culture NO GROWTH 5 DAYS  Final   Report Status 07/30/2015 FINAL  Final  CULTURE, BLOOD (ROUTINE X 2) w Reflex to PCR ID Panel     Status: None   Collection Time: 07/25/15  6:08 AM  Result Value Ref Range Status   Specimen Description  BLOOD LEFT WRIST  Final   Special Requests   Final    BOTTLES DRAWN AEROBIC AND ANAEROBIC ANA AER   Culture NO GROWTH 5 DAYS  Final   Report Status 07/30/2015 FINAL  Final  Cath Tip Culture     Status: None (Preliminary result)   Collection Time: 07/28/15  7:32 PM  Result Value Ref Range Status    Specimen Description CATH TIP  Final   Special Requests NONE  Final   Culture NO GROWTH 2 DAYS  Final   Report Status PENDING  Incomplete     Studies: Dg Chest 1 View  07/28/2015  CLINICAL DATA:  82 year old male with chest pain and shortness of breath EXAM: CHEST 1 VIEW COMPARISON:  Radiograph dated 07/28/2015 FINDINGS: Single-view of the chest demonstrates cardiomegaly. The lungs are hypovolemic. There bilateral lower lobe opacities with no significant interval change compared to prior study. There is no pneumothorax. No acute osseous pathology. IMPRESSION: No significant interval change. Bilateral lower lung field airspace opacities, right greater than left. Electronically Signed   By: Elgie Collard M.D.   On: 07/28/2015 21:12   Dg Chest 1 View  07/28/2015  CLINICAL DATA:  Cough. EXAM: CHEST 1 VIEW COMPARISON:  07/22/2015 FINDINGS: Bilateral lower lobe airspace opacities are noted, increasing on the right since prior study, similar on the left. Small bilateral pleural effusions. Cardiomegaly with vascular congestion. IMPRESSION: Bilateral lower lobe airspace opacities, worsening on the right. Cannot exclude pneumonia. Bilateral effusions. Mild cardiomegaly. Electronically Signed   By: Charlett Nose M.D.   On: 07/28/2015 16:46    Scheduled Meds: . allopurinol  100 mg Oral Daily  . aspirin EC  81 mg Oral Daily  . atorvastatin  20 mg Oral Daily  . clopidogrel  75 mg Oral Daily  . diltiazem  240 mg Oral Daily  . epoetin (EPOGEN/PROCRIT) injection  10,000 Units Intravenous Q T,Th,Sa-HD  . ferrous sulfate  325 mg Oral Daily  . heparin  5,000 Units Subcutaneous 3 times per day  . latanoprost  1 drop Both Eyes QHS  . metoprolol  5 mg Intravenous 4 times per day  . metoprolol tartrate  25 mg Oral BID  . piperacillin-tazobactam (ZOSYN)  IV  3.375 g Intravenous Q12H  . pregabalin  75 mg Oral Daily  . senna  17.2 mg Oral QHS  . [START ON 08/01/2015] vancomycin  750 mg Intravenous Q T,Th,Sa-HD    Assessment/Plan:  1. Atrial fibrillation with rapid ventricular response. Increase Cardizem CD 280 mg daily. Added metoprolol 25 mg twice a day.  Blood pressure borderline low. Patient on aspirin and Plavix for anticoagulation. Try to get out of icu to telemetry today 2. MRSA sepsis- patient on IV vancomycin 3. Pneumonia- unclear whether MRSA or aspiration still on Zosyn 4. End-stage renal disease on hemodialysis as per nephrology 5. Anemia of chronic disease on ferrous sulfate, Epogen with dialysis 6. Neuropathy on Lyrica 7. Glaucoma on latanoprost 8. Hyperlipidemia unspecified on atorvastatin 9. History of stroke  Code Status:     Code Status Orders        Start     Ordered   07/22/15 2038  Full code   Continuous     07/22/15 2038    Code Status History    Date Active Date Inactive Code Status Order ID Comments User Context   06/24/2015  4:07 PM 06/27/2015 12:37 AM Full Code [id number]  Auburn Bilberry, MD Inpatient   05/30/2015  5:22 PM 06/05/2015  6:42 PM Full Code [id number]  Trudee Kuster, RN Inpatient   05/30/2015  1:59 PM 05/30/2015  5:22 PM DNR [id number]  Shaune Pollack, MD Inpatient   05/29/2015  3:57 PM 05/29/2015  7:32 PM Full Code [id number]  Annice Needy, MD Inpatient   04/08/2015  8:31 PM 04/17/2015 11:42 PM Full Code [id number]  Gracelyn Nurse, MD Inpatient   03/04/2015  4:13 PM 03/11/2015  7:00 PM DNR [id number]  Houston Siren, MD Inpatient     Disposition Plan: To be determined. Left message for daughter  Consultants:  Nephrology  Vascular surgery  Procedures:  Left IJ permacath removed with sepsis  Temporary dialysis catheter placed  Antibiotics:  Vancomycin  Zosyn  Time spent: 20 minutes  Alford Highland  [facility]

## 2015-07-30 NOTE — Progress Notes (Signed)
Subjective:  Patient seen at bedside in CCU. Resting comfortably. Had HD yesterday.     Objective:  Vital signs in last 24 hours:  Temp:  [98.3 F (36.8 C)] 98.3 F (36.8 C) (01/21 2330) Pulse Rate:  [25-133] 49 (01/22 1000) Resp:  [17-27] 23 (01/22 1000) BP: (77-143)/(39-105) 95/41 mmHg (01/22 1000) SpO2:  [83 %-99 %] 97 % (01/22 1000)  Weight change:  Filed Weights   07/26/15 1338 07/27/15 1215 07/28/15 2149  Weight: 92.9 kg (204 lb 12.9 oz) 92.3 kg (203 lb 7.8 oz) 90.7 kg (199 lb 15.3 oz)    Intake/Output:   No intake or output data in the 24 hours ending 07/30/15 1245   Physical Exam: General:  no acute distress, laying in the bed   HEENT  anicteric, moist oral mucous membranes   Neck  supple   Pulm/lungs  clear to auscultation bilateral, normal effort  CVS/Heart  S1S2 irregular  Abdomen:   soft, nontender, nondistended   Extremities:  no peripheral edema   Neurologic:  alert, oriented, follows commands  Skin:  no acute rashes   Access:  temporary right femoral dialysis catheter replaced       Basic Metabolic Panel:   Recent Labs Lab 07/24/15 0505 07/24/15 1712 07/26/15 0537 07/27/15 1247 07/28/15 0456 07/29/15 0418  NA  --   --   --   --  143 144  K 4.1  --   --   --  2.8* 4.8  CL  --   --   --   --  103 107  CO2  --   --   --   --  32 29  GLUCOSE  --   --   --   --  99 116*  BUN  --   --   --   --  11 24*  CREATININE  --   --   --   --  2.81* 4.32*  CALCIUM  --   --   --   --  8.2* 8.3*  MG 2.4  --   --   --  2.0  --   PHOS  --  7.0* 5.9* 4.5  --   --      CBC:  Recent Labs Lab 07/28/15 0456 07/29/15 0418  WBC 12.6* 15.3*  HGB 8.2* 8.3*  HCT 27.6* 27.4*  MCV 87.2 86.6  PLT 282 294      Microbiology:  Recent Results (from the past 720 hour(s))  Culture, blood (routine x 2)     Status: None   Collection Time: 07/22/15  6:21 PM  Result Value Ref Range Status   Specimen Description BLOOD LEFT HAND  Final   Special Requests  BOTTLES DRAWN AEROBIC AND ANAEROBIC  4CC  Final   Culture  Setup Time   Final    GRAM POSITIVE COCCI IN CLUSTERS AEROBIC BOTTLE ONLY CRITICAL RESULT CALLED TO, READ BACK BY AND VERIFIED WITH: CRYSTAL [facility] 07/23/15 1330 MLM    Culture   Final    METHICILLIN RESISTANT STAPHYLOCOCCUS AUREUS AEROBIC BOTTLE ONLY    Report Status 07/27/2015 FINAL  Final   Organism ID, Bacteria METHICILLIN RESISTANT STAPHYLOCOCCUS AUREUS  Final      Susceptibility   Methicillin resistant staphylococcus aureus - MIC*    CIPROFLOXACIN >=8 RESISTANT Resistant     ERYTHROMYCIN <=0.25 SENSITIVE Sensitive     GENTAMICIN <=0.5 SENSITIVE Sensitive     OXACILLIN >=4 RESISTANT Resistant     VANCOMYCIN 1 SENSITIVE  Sensitive     TRIMETH/SULFA <=10 SENSITIVE Sensitive     CLINDAMYCIN <=0.25 SENSITIVE Sensitive     CEFOXITIN SCREEN Value in next row Resistant      POSITIVECEFOXITIN SCREEN - This test may be used to predict mecA-mediated oxacillin resistance, and it is based on the cefoxitin disk screen test.  The cefoxitin screen and oxacillin work in combination to determine the final interpretation reported for oxacillin.     Inducible Clindamycin Value in next row Sensitive      POSITIVECEFOXITIN SCREEN - This test may be used to predict mecA-mediated oxacillin resistance, and it is based on the cefoxitin disk screen test.  The cefoxitin screen and oxacillin work in combination to determine the final interpretation reported for oxacillin.     TETRACYCLINE Value in next row Sensitive      SENSITIVE<=1    * METHICILLIN RESISTANT STAPHYLOCOCCUS AUREUS  Culture, blood (routine x 2)     Status: None   Collection Time: 07/22/15  6:21 PM  Result Value Ref Range Status   Specimen Description BLOOD LEFT HAND  Final   Special Requests   Final    BOTTLES DRAWN AEROBIC AND ANAEROBIC  AER 2CC ANA 4CC   Culture  Setup Time   Final    GRAM POSITIVE COCCI IN CLUSTERS IN BOTH AEROBIC AND ANAEROBIC BOTTLES CRITICAL RESULT CALLED  TO, READ BACK BY AND VERIFIED WITH: CRYSTAL SCARPENA 07/23/15 1400 MLM    Culture METHICILLIN RESISTANT STAPHYLOCOCCUS AUREUS  Final   Report Status 07/27/2015 FINAL  Final   Organism ID, Bacteria METHICILLIN RESISTANT STAPHYLOCOCCUS AUREUS  Final      Susceptibility   Methicillin resistant staphylococcus aureus - MIC*    CIPROFLOXACIN >=8 RESISTANT Resistant     ERYTHROMYCIN 0.5 SENSITIVE Sensitive     GENTAMICIN <=0.5 SENSITIVE Sensitive     OXACILLIN >=4 RESISTANT Resistant     VANCOMYCIN 1 SENSITIVE Sensitive     TRIMETH/SULFA <=10 SENSITIVE Sensitive     CLINDAMYCIN <=0.25 SENSITIVE Sensitive     CEFOXITIN SCREEN Value in next row Resistant      POSITIVECEFOXITIN SCREEN - This test may be used to predict mecA-mediated oxacillin resistance, and it is based on the cefoxitin disk screen test.  The cefoxitin screen and oxacillin work in combination to determine the final interpretation reported for oxacillin.     Inducible Clindamycin Value in next row Sensitive      POSITIVECEFOXITIN SCREEN - This test may be used to predict mecA-mediated oxacillin resistance, and it is based on the cefoxitin disk screen test.  The cefoxitin screen and oxacillin work in combination to determine the final interpretation reported for oxacillin.     TETRACYCLINE Value in next row Sensitive      SENSITIVE<=1    * METHICILLIN RESISTANT STAPHYLOCOCCUS AUREUS  Blood Culture ID Panel (Reflexed)     Status: Abnormal   Collection Time: 07/22/15  6:21 PM  Result Value Ref Range Status   Enterococcus species NOT DETECTED NOT DETECTED Final   Listeria monocytogenes NOT DETECTED NOT DETECTED Final   Staphylococcus species DETECTED (A) NOT DETECTED Final   Staphylococcus aureus DETECTED (A) NOT DETECTED Final    Comment: CRITICAL RESULT CALLED TO, READ BACK BY AND VERIFIED WITH: CRYSTAL SCARPENA 07/23/15 1330 ABOUT STAPH SPECIES,STAPH AUREUS, AND MecA DETECTED MLM    Streptococcus species NOT DETECTED NOT DETECTED  Final   Streptococcus agalactiae NOT DETECTED NOT DETECTED Final   Streptococcus pneumoniae NOT DETECTED NOT DETECTED Final  Streptococcus pyogenes NOT DETECTED NOT DETECTED Final   Acinetobacter baumannii NOT DETECTED NOT DETECTED Final   Enterobacteriaceae species NOT DETECTED NOT DETECTED Final   Enterobacter cloacae complex NOT DETECTED NOT DETECTED Final   Escherichia coli NOT DETECTED NOT DETECTED Final   Klebsiella oxytoca NOT DETECTED NOT DETECTED Final   Klebsiella pneumoniae NOT DETECTED NOT DETECTED Final   Proteus species NOT DETECTED NOT DETECTED Final   Serratia marcescens NOT DETECTED NOT DETECTED Final   Haemophilus influenzae NOT DETECTED NOT DETECTED Final   Neisseria meningitidis NOT DETECTED NOT DETECTED Final   Pseudomonas aeruginosa NOT DETECTED NOT DETECTED Final   Candida albicans NOT DETECTED NOT DETECTED Final   Candida glabrata NOT DETECTED NOT DETECTED Final   Candida krusei NOT DETECTED NOT DETECTED Final   Candida parapsilosis NOT DETECTED NOT DETECTED Final   Candida tropicalis NOT DETECTED NOT DETECTED Final   Carbapenem resistance NOT DETECTED NOT DETECTED Final   Methicillin resistance DETECTED (A) NOT DETECTED Final   Vancomycin resistance NOT DETECTED NOT DETECTED Final  MRSA PCR Screening     Status: Abnormal   Collection Time: 07/23/15  5:24 AM  Result Value Ref Range Status   MRSA by PCR POSITIVE (A) NEGATIVE Final    Comment:        The GeneXpert MRSA Assay (FDA approved for NASAL specimens only), is one component of a comprehensive MRSA colonization surveillance program. It is not intended to diagnose MRSA infection nor to guide or monitor treatment for MRSA infections. CRITICAL RESULT CALLED TO, READ BACK BY AND VERIFIED WITH: ALISHA BABB ON 07/23/15 AT 0735 BY QSD   CULTURE, BLOOD (ROUTINE X 2) w Reflex to PCR ID Panel     Status: None   Collection Time: 07/25/15  6:08 AM  Result Value Ref Range Status   Specimen Description BLOOD  LEFT AC  Final   Special Requests BOTTLES DRAWN AEROBIC AND ANAEROBIC ANA2ML AER3ML  Final   Culture NO GROWTH 5 DAYS  Final   Report Status 07/30/2015 FINAL  Final  CULTURE, BLOOD (ROUTINE X 2) w Reflex to PCR ID Panel     Status: None   Collection Time: 07/25/15  6:08 AM  Result Value Ref Range Status   Specimen Description BLOOD LEFT WRIST  Final   Special Requests   Final    BOTTLES DRAWN AEROBIC AND ANAEROBIC ANA AER   Culture NO GROWTH 5 DAYS  Final   Report Status 07/30/2015 FINAL  Final  Cath Tip Culture     Status: None (Preliminary result)   Collection Time: 07/28/15  7:32 PM  Result Value Ref Range Status   Specimen Description CATH TIP  Final   Special Requests NONE  Final   Culture NO GROWTH 2 DAYS  Final   Report Status PENDING  Incomplete    Coagulation Studies: No results for input(s): LABPROT, INR in the last 72 hours.  Urinalysis: No results for input(s): COLORURINE, LABSPEC, PHURINE, GLUCOSEU, HGBUR, BILIRUBINUR, KETONESUR, PROTEINUR, UROBILINOGEN, NITRITE, LEUKOCYTESUR in the last 72 hours.  Invalid input(s): APPERANCEUR    Imaging: Dg Chest 1 View  07/28/2015  CLINICAL DATA:  82 year old male with chest pain and shortness of breath EXAM: CHEST 1 VIEW COMPARISON:  Radiograph dated 07/28/2015 FINDINGS: Single-view of the chest demonstrates cardiomegaly. The lungs are hypovolemic. There bilateral lower lobe opacities with no significant interval change compared to prior study. There is no pneumothorax. No acute osseous pathology. IMPRESSION: No significant interval change. Bilateral lower lung  field airspace opacities, right greater than left. Electronically Signed   By: Elgie Collard M.D.   On: 07/28/2015 21:12   Dg Chest 1 View  07/28/2015  CLINICAL DATA:  Cough. EXAM: CHEST 1 VIEW COMPARISON:  07/22/2015 FINDINGS: Bilateral lower lobe airspace opacities are noted, increasing on the right since prior study, similar on the left. Small bilateral pleural  effusions. Cardiomegaly with vascular congestion. IMPRESSION: Bilateral lower lobe airspace opacities, worsening on the right. Cannot exclude pneumonia. Bilateral effusions. Mild cardiomegaly. Electronically Signed   By: Charlett Nose M.D.   On: 07/28/2015 16:46     Medications:     . allopurinol  100 mg Oral Daily  . aspirin EC  81 mg Oral Daily  . atorvastatin  20 mg Oral Daily  . clopidogrel  75 mg Oral Daily  . diltiazem  240 mg Oral Daily  . epoetin (EPOGEN/PROCRIT) injection  10,000 Units Intravenous Q T,Th,Sa-HD  . ferrous sulfate  325 mg Oral Daily  . heparin  5,000 Units Subcutaneous 3 times per day  . latanoprost  1 drop Both Eyes QHS  . metoprolol  5 mg Intravenous 4 times per day  . metoprolol tartrate  25 mg Oral BID  . piperacillin-tazobactam (ZOSYN)  IV  3.375 g Intravenous Q12H  . pregabalin  75 mg Oral Daily  . senna  17.2 mg Oral QHS  . [START ON 08/01/2015] vancomycin  750 mg Intravenous Q T,Th,Sa-HD   sodium chloride, sodium chloride, benzonatate, heparin, lidocaine (PF), lidocaine-prilocaine, naLOXone (NARCAN)  injection, oxyCODONE-acetaminophen, pentafluoroprop-tetrafluoroeth  Assessment/ Plan:  82 y.o. african [place] male  with right eye blindness, diastolic congestive heart failure, hypertension, gout, glaucoma, peripheral neuropathy, atrial fibrillation, anemia, carotid stenosis status post bilateral CEA, CVA, peripheral vascular disease  [facility]/ [facility]/ TTS  1. End Stage Renal Disease/MRSA sepsis:  IJ permcath appears to have completely dislodged. Temp right femoral HD catheter removed and replaced 07/28/15. -Pt completed yesterday, no acute indication for HD at the moment, will plan for HD again on Tuesday.  2. Anemia of chronic kidney disease: Hemoglobin down to 8.3. - continue epogen 10000 units IV with HD.   3. Secondary Hyperparathyroidism:  - phos currently 4.5 and acceptable.    4. Hypokalemia -K now up to 4.8,  prior to this it was 2.8, continue to monitor.     LOS: 8 Retal Tonkinson 1/22/201712:45 PM

## 2015-07-31 LAB — C DIFFICILE QUICK SCREEN W PCR REFLEX
C DIFFICLE (CDIFF) ANTIGEN: POSITIVE — AB
C Diff toxin: NEGATIVE

## 2015-07-31 LAB — CATH TIP CULTURE: CULTURE: NO GROWTH

## 2015-07-31 LAB — CLOSTRIDIUM DIFFICILE BY PCR: CDIFFPCR: POSITIVE — AB

## 2015-07-31 MED ORDER — VANCOMYCIN 50 MG/ML ORAL SOLUTION
250.0000 mg | Freq: Four times a day (QID) | ORAL | Status: DC
  Administered 2015-07-31: 250 mg via ORAL
  Filled 2015-07-31 (×4): qty 5

## 2015-07-31 MED ORDER — METRONIDAZOLE 50 MG/ML ORAL SUSPENSION
500.0000 mg | Freq: Three times a day (TID) | ORAL | Status: DC
  Filled 2015-07-31 (×2): qty 10

## 2015-07-31 MED ORDER — METRONIDAZOLE 500 MG PO TABS
500.0000 mg | ORAL_TABLET | Freq: Three times a day (TID) | ORAL | Status: DC
  Administered 2015-07-31 – 2015-08-04 (×11): 500 mg via ORAL
  Filled 2015-07-31 (×11): qty 1

## 2015-07-31 NOTE — Progress Notes (Signed)
Dr. Renae Gloss notified of positive c.diff. Also questioned scheduled IV metoprolol with low BP. MD to place orders.

## 2015-07-31 NOTE — Care Management Important Message (Signed)
Important Message  Patient Details  Name: Sean Delgado. MRN: [id number] Date of Birth: Jul 26, 1933   Medicare Important Message Given:  Yes    Jayleah Garbers A, RN 07/31/2015, 8:29 AM

## 2015-07-31 NOTE — Progress Notes (Signed)
Patient ID: Sean Delgado., male   DOB: 1933/08/22, 82 y.o.   MRN: [id number] [facility] PROGRESS NOTE  Sean Delgado. ONG:[id number] DOB: 01-19-1934 DOA: 07/22/2015 PCP: Bobbye Morton, MD  HPI/Subjective: Patient feeling okay. Offers no complaints. Could not tell me that he had diarrhea. As per nurse, this morning he had 3 episodes of diarrhea. Stool for C. difficile came back positive.  Objective: Filed Vitals:   07/31/15 0941 07/31/15 1249  BP: 115/44 101/37  Pulse: 66 104  Temp:  98.7 F (37.1 C)  Resp:  16    Filed Weights   07/27/15 1215 07/28/15 2149 07/31/15 0023  Weight: 92.3 kg (203 lb 7.8 oz) 90.7 kg (199 lb 15.3 oz) 91.491 kg (201 lb 11.2 oz)    ROS: Review of Systems  Constitutional: Negative for fever and chills.  Eyes: Negative for blurred vision.  Respiratory: Positive for cough. Negative for shortness of breath.   Cardiovascular: Negative for chest pain.  Gastrointestinal: Positive for diarrhea. Negative for nausea, vomiting, abdominal pain and constipation.  Genitourinary: Negative for dysuria.  Musculoskeletal: Negative for joint pain.  Neurological: Negative for dizziness and headaches.   Exam: Physical Exam  HENT:  Nose: No mucosal edema.  Mouth/Throat: No oropharyngeal exudate or posterior oropharyngeal edema.  Eyes: Conjunctivae, EOM and lids are normal. Pupils are equal, round, and reactive to light.  Neck: No JVD present. Carotid bruit is not present. No edema present. No thyroid mass and no thyromegaly present.  Cardiovascular: S1 normal and S2 normal.  An irregularly irregular rhythm present. Tachycardia present.  Exam reveals no gallop.   No murmur heard. Pulses:      Dorsalis pedis pulses are 2+ on the right side, and 2+ on the left side.  Respiratory: No respiratory distress. He has no wheezes. He has rhonchi in the right lower field and the left lower field. He has no rales.  GI: Soft. Bowel sounds are normal. There is no  tenderness.  Musculoskeletal:       Right ankle: He exhibits swelling.       Left ankle: He exhibits swelling.  Lymphadenopathy:    He has no cervical adenopathy.  Neurological: He is alert.  Skin: Skin is warm. No rash noted. Nails show no clubbing.  Psychiatric: He has a normal mood and affect.      Data Reviewed: Basic Metabolic Panel:  Recent Labs Lab 07/24/15 1712 07/26/15 0537 07/27/15 1247 07/28/15 0456 07/29/15 0418  NA  --   --   --  143 144  K  --   --   --  2.8* 4.8  CL  --   --   --  103 107  CO2  --   --   --  32 29  GLUCOSE  --   --   --  99 116*  BUN  --   --   --  11 24*  CREATININE  --   --   --  2.81* 4.32*  CALCIUM  --   --   --  8.2* 8.3*  MG  --   --   --  2.0  --   PHOS 7.0* 5.9* 4.5  --   --    CBC:  Recent Labs Lab 07/28/15 0456 07/29/15 0418  WBC 12.6* 15.3*  HGB 8.2* 8.3*  HCT 27.6* 27.4*  MCV 87.2 86.6  PLT 282 294   BNP (last 3 results)  Recent Labs  03/04/15 1212 04/08/15 1621 05/30/15  0844  BNP 560.0* 763.0* 724.0*   CBG:  Recent Labs Lab 07/28/15 2148 07/30/15 1746  GLUCAP 118* 94    Recent Results (from the past 240 hour(s))  Culture, blood (routine x 2)     Status: None   Collection Time: 07/22/15  6:21 PM  Result Value Ref Range Status   Specimen Description BLOOD LEFT HAND  Final   Special Requests BOTTLES DRAWN AEROBIC AND ANAEROBIC  4CC  Final   Culture  Setup Time   Final    GRAM POSITIVE COCCI IN CLUSTERS AEROBIC BOTTLE ONLY CRITICAL RESULT CALLED TO, READ BACK BY AND VERIFIED WITH: CRYSTAL SCARPENA 07/23/15 1330 MLM    Culture   Final    METHICILLIN RESISTANT STAPHYLOCOCCUS AUREUS AEROBIC BOTTLE ONLY    Report Status 07/27/2015 FINAL  Final   Organism ID, Bacteria METHICILLIN RESISTANT STAPHYLOCOCCUS AUREUS  Final      Susceptibility   Methicillin resistant staphylococcus aureus - MIC*    CIPROFLOXACIN >=8 RESISTANT Resistant     ERYTHROMYCIN <=0.25 SENSITIVE Sensitive     GENTAMICIN <=0.5  SENSITIVE Sensitive     OXACILLIN >=4 RESISTANT Resistant     VANCOMYCIN 1 SENSITIVE Sensitive     TRIMETH/SULFA <=10 SENSITIVE Sensitive     CLINDAMYCIN <=0.25 SENSITIVE Sensitive     CEFOXITIN SCREEN Value in next row Resistant      POSITIVECEFOXITIN SCREEN - This test may be used to predict mecA-mediated oxacillin resistance, and it is based on the cefoxitin disk screen test.  The cefoxitin screen and oxacillin work in combination to determine the final interpretation reported for oxacillin.     Inducible Clindamycin Value in next row Sensitive      POSITIVECEFOXITIN SCREEN - This test may be used to predict mecA-mediated oxacillin resistance, and it is based on the cefoxitin disk screen test.  The cefoxitin screen and oxacillin work in combination to determine the final interpretation reported for oxacillin.     TETRACYCLINE Value in next row Sensitive      SENSITIVE<=1    * METHICILLIN RESISTANT STAPHYLOCOCCUS AUREUS  Culture, blood (routine x 2)     Status: None   Collection Time: 07/22/15  6:21 PM  Result Value Ref Range Status   Specimen Description BLOOD LEFT HAND  Final   Special Requests   Final    BOTTLES DRAWN AEROBIC AND ANAEROBIC  AER 2CC ANA 4CC   Culture  Setup Time   Final    GRAM POSITIVE COCCI IN CLUSTERS IN BOTH AEROBIC AND ANAEROBIC BOTTLES CRITICAL RESULT CALLED TO, READ BACK BY AND VERIFIED WITH: CRYSTAL SCARPENA 07/23/15 1400 MLM    Culture METHICILLIN RESISTANT STAPHYLOCOCCUS AUREUS  Final   Report Status 07/27/2015 FINAL  Final   Organism ID, Bacteria METHICILLIN RESISTANT STAPHYLOCOCCUS AUREUS  Final      Susceptibility   Methicillin resistant staphylococcus aureus - MIC*    CIPROFLOXACIN >=8 RESISTANT Resistant     ERYTHROMYCIN 0.5 SENSITIVE Sensitive     GENTAMICIN <=0.5 SENSITIVE Sensitive     OXACILLIN >=4 RESISTANT Resistant     VANCOMYCIN 1 SENSITIVE Sensitive     TRIMETH/SULFA <=10 SENSITIVE Sensitive     CLINDAMYCIN <=0.25 SENSITIVE Sensitive      CEFOXITIN SCREEN Value in next row Resistant      POSITIVECEFOXITIN SCREEN - This test may be used to predict mecA-mediated oxacillin resistance, and it is based on the cefoxitin disk screen test.  The cefoxitin screen and oxacillin work in combination to determine the  final interpretation reported for oxacillin.     Inducible Clindamycin Value in next row Sensitive      POSITIVECEFOXITIN SCREEN - This test may be used to predict mecA-mediated oxacillin resistance, and it is based on the cefoxitin disk screen test.  The cefoxitin screen and oxacillin work in combination to determine the final interpretation reported for oxacillin.     TETRACYCLINE Value in next row Sensitive      SENSITIVE<=1    * METHICILLIN RESISTANT STAPHYLOCOCCUS AUREUS  Blood Culture ID Panel (Reflexed)     Status: Abnormal   Collection Time: 07/22/15  6:21 PM  Result Value Ref Range Status   Enterococcus species NOT DETECTED NOT DETECTED Final   Listeria monocytogenes NOT DETECTED NOT DETECTED Final   Staphylococcus species DETECTED (A) NOT DETECTED Final   Staphylococcus aureus DETECTED (A) NOT DETECTED Final    Comment: CRITICAL RESULT CALLED TO, READ BACK BY AND VERIFIED WITH: CRYSTAL SCARPENA 07/23/15 1330 ABOUT STAPH SPECIES,STAPH AUREUS, AND MecA DETECTED MLM    Streptococcus species NOT DETECTED NOT DETECTED Final   Streptococcus agalactiae NOT DETECTED NOT DETECTED Final   Streptococcus pneumoniae NOT DETECTED NOT DETECTED Final   Streptococcus pyogenes NOT DETECTED NOT DETECTED Final   Acinetobacter baumannii NOT DETECTED NOT DETECTED Final   Enterobacteriaceae species NOT DETECTED NOT DETECTED Final   Enterobacter cloacae complex NOT DETECTED NOT DETECTED Final   Escherichia coli NOT DETECTED NOT DETECTED Final   Klebsiella oxytoca NOT DETECTED NOT DETECTED Final   Klebsiella pneumoniae NOT DETECTED NOT DETECTED Final   Proteus species NOT DETECTED NOT DETECTED Final   Serratia marcescens NOT DETECTED NOT  DETECTED Final   Haemophilus influenzae NOT DETECTED NOT DETECTED Final   Neisseria meningitidis NOT DETECTED NOT DETECTED Final   Pseudomonas aeruginosa NOT DETECTED NOT DETECTED Final   Candida albicans NOT DETECTED NOT DETECTED Final   Candida glabrata NOT DETECTED NOT DETECTED Final   Candida krusei NOT DETECTED NOT DETECTED Final   Candida parapsilosis NOT DETECTED NOT DETECTED Final   Candida tropicalis NOT DETECTED NOT DETECTED Final   Carbapenem resistance NOT DETECTED NOT DETECTED Final   Methicillin resistance DETECTED (A) NOT DETECTED Final   Vancomycin resistance NOT DETECTED NOT DETECTED Final  MRSA PCR Screening     Status: Abnormal   Collection Time: 07/23/15  5:24 AM  Result Value Ref Range Status   MRSA by PCR POSITIVE (A) NEGATIVE Final    Comment:        The GeneXpert MRSA Assay (FDA approved for NASAL specimens only), is one component of a comprehensive MRSA colonization surveillance program. It is not intended to diagnose MRSA infection nor to guide or monitor treatment for MRSA infections. CRITICAL RESULT CALLED TO, READ BACK BY AND VERIFIED WITH: ALISHA BABB ON 07/23/15 AT 0735 BY QSD   CULTURE, BLOOD (ROUTINE X 2) w Reflex to PCR ID Panel     Status: None   Collection Time: 07/25/15  6:08 AM  Result Value Ref Range Status   Specimen Description BLOOD LEFT AC  Final   Special Requests BOTTLES DRAWN AEROBIC AND ANAEROBIC ANA2ML AER3ML  Final   Culture NO GROWTH 5 DAYS  Final   Report Status 07/30/2015 FINAL  Final  CULTURE, BLOOD (ROUTINE X 2) w Reflex to PCR ID Panel     Status: None   Collection Time: 07/25/15  6:08 AM  Result Value Ref Range Status   Specimen Description BLOOD LEFT WRIST  Final   Special Requests  Final    BOTTLES DRAWN AEROBIC AND ANAEROBIC ANA AER   Culture NO GROWTH 5 DAYS  Final   Report Status 07/30/2015 FINAL  Final  Cath Tip Culture     Status: None   Collection Time: 07/28/15  7:32 PM  Result Value Ref Range  Status   Specimen Description CATH TIP  Final   Special Requests NONE  Final   Culture NO GROWTH 3 DAYS  Final   Report Status 07/31/2015 FINAL  Final  C difficile quick scan w PCR reflex     Status: Abnormal   Collection Time: 07/31/15 10:28 AM  Result Value Ref Range Status   C Diff antigen POSITIVE (A) NEGATIVE Final   C Diff toxin NEGATIVE NEGATIVE Final   C Diff interpretation   Final    Positive for toxigenic C. difficile, active toxin production not detected. Patient has toxigenic C. difficile organisms present in the bowel, but toxin was not detected. The patient may be a carrier or the level of toxin in the sample was below the limit  of detection. This information should be used in conjunction with the patient's clinical history when deciding on possible therapy.   Clostridium Difficile by PCR     Status: Abnormal   Collection Time: 07/31/15 10:28 AM  Result Value Ref Range Status   Toxigenic C Difficile by pcr POSITIVE (A) NEGATIVE Final    Comment: CRITICAL RESULT CALLED TO, READ BACK BY AND VERIFIED WITH: TAYLOR BECK 07/31/15 AT 1211 VKB      Scheduled Meds: . allopurinol  100 mg Oral Daily  . aspirin EC  81 mg Oral Daily  . atorvastatin  20 mg Oral Daily  . clopidogrel  75 mg Oral Daily  . diltiazem  240 mg Oral Daily  . epoetin (EPOGEN/PROCRIT) injection  10,000 Units Intravenous Q T,Th,Sa-HD  . ferrous sulfate  325 mg Oral Daily  . heparin  5,000 Units Subcutaneous 3 times per day  . latanoprost  1 drop Both Eyes QHS  . metoprolol tartrate  25 mg Oral BID  . metroNIDAZOLE  500 mg Oral TID  . pregabalin  75 mg Oral Daily  . [START ON 08/01/2015] vancomycin  750 mg Intravenous Q T,Th,Sa-HD   Assessment/Plan:  1. Atrial fibrillation with rapid ventricular response. Cardizem CD 280 mg daily and metoprolol 25 mg twice a day.  Blood pressure borderline low. Patient on aspirin and Plavix for anticoagulation. 2. MRSA sepsis- patient on IV vancomycin 3. Clostridium  difficile colitis- infectious disease specialist changed him to by mouth Flagyl 4. Pneumonia, likely aspiration- Zosyn stopped with C. difficile colitis. 5. End-stage renal disease on hemodialysis as per nephrology 6. Anemia of chronic disease on ferrous sulfate, Epogen with dialysis 7. Neuropathy on Lyrica 8. Glaucoma on latanoprost 9. Hyperlipidemia unspecified on atorvastatin 10. History of stroke  Code Status:     Code Status Orders        Start     Ordered   07/22/15 2038  Full code   Continuous     07/22/15 2038    Code Status History    Date Active Date Inactive Code Status Order ID Comments User Context   06/24/2015  4:07 PM 06/27/2015 12:37 AM Full Code [id number]  Auburn Bilberry, MD Inpatient   05/30/2015  5:22 PM 06/05/2015  6:42 PM Full Code [id number]  Trudee Kuster, RN Inpatient   05/30/2015  1:59 PM 05/30/2015  5:22 PM DNR [id number]  Shaune Pollack, MD Inpatient  05/29/2015  3:57 PM 05/29/2015  7:32 PM Full Code [id number]  Annice Needy, MD Inpatient   04/08/2015  8:31 PM 04/17/2015 11:42 PM Full Code [id number]  Gracelyn Nurse, MD Inpatient   03/04/2015  4:13 PM 03/11/2015  7:00 PM DNR [id number]  Houston Siren, MD Inpatient     Disposition Plan: To be determined. Left message for daughter  Consultants:  Nephrology  Vascular surgery  Procedures:  Left IJ permacath removed with sepsis  Temporary dialysis catheter placed  Antibiotics:  Vancomycin  Oral Flagyl  Time spent: 24 minutes  Alford Highland  [facility]

## 2015-07-31 NOTE — Progress Notes (Signed)
CSW confirmed with Gavin Pound, admissions coordinator with [facility]. She confirms that patient is a LTC resident at their facility. She reports that patient can return at discharge. CSW will continue to follow and assist.   Woodroe Mode, MSW, LCSW-A Clinical Social Work Department [phone]

## 2015-07-31 NOTE — Care Management Note (Addendum)
Case Management Note  Patient Details  Name: Sean Delgado. MRN: [id number] Date of Birth: June 16, 1934  Subjective/Objective:  Patient from [facility] Patient is a [facility] patient                 Action/Plan: CSW updated. Will follow progression.   Expected Discharge Date:                  Expected Discharge Plan:  Skilled Nursing Facility  In-House Referral:  Clinical Social Work  Discharge planning Services     Post Acute Care Choice:    Choice offered to:     DME Arranged:    DME Agency:     HH Arranged:    HH Agency:     Status of Service:  In process, will continue to follow  Medicare Important Message Given:  Yes Date Medicare IM Given:    Medicare IM give by:    Date Additional Medicare IM Given:    Additional Medicare Important Message give by:     If discussed at Long Length of Stay Meetings, dates discussed:    Additional Comments:  Marily Memos, RN 07/31/2015, 10:03 AM

## 2015-07-31 NOTE — Progress Notes (Signed)
Subjective:   Laying in bed. Eating breakfast with assistance.   Objective:  Vital signs in last 24 hours:  Temp:  [97.9 F (36.6 C)-98.6 F (37 C)] 97.9 F (36.6 C) (01/23 0612) Pulse Rate:  [43-145] 66 (01/23 0941) Resp:  [18-27] 20 (01/23 0612) BP: (71-122)/(41-104) 115/44 mmHg (01/23 0941) SpO2:  [84 %-97 %] 95 % (01/23 0612) Weight:  [91.491 kg (201 lb 11.2 oz)] 91.491 kg (201 lb 11.2 oz) (01/23 0023)  Weight change:  Filed Weights   07/27/15 1215 07/28/15 2149 07/31/15 0023  Weight: 92.3 kg (203 lb 7.8 oz) 90.7 kg (199 lb 15.3 oz) 91.491 kg (201 lb 11.2 oz)    Intake/Output:    Intake/Output Summary (Last 24 hours) at 07/31/15 1026 Last data filed at 07/31/15 0700  Gross per 24 hour  Intake    470 ml  Output      0 ml  Net    470 ml     Physical Exam: General:  no acute distress, laying in the bed   HEENT  anicteric, moist oral mucous membranes   Neck  supple   Pulm/lungs  clear to auscultation bilateral, normal effort  CVS/Heart  S1S2 irregular  Abdomen:   soft, nontender, nondistended   Extremities:  no peripheral edema   Neurologic:  alert, oriented, follows commands  Skin:  no acute rashes   Access:  temporary right femoral dialysis catheter 07/28/15. Right AVF maturing with bruit and thrill       Basic Metabolic Panel:   Recent Labs Lab 07/24/15 1712 07/26/15 0537 07/27/15 1247 07/28/15 0456 07/29/15 0418  NA  --   --   --  143 144  K  --   --   --  2.8* 4.8  CL  --   --   --  103 107  CO2  --   --   --  32 29  GLUCOSE  --   --   --  99 116*  BUN  --   --   --  11 24*  CREATININE  --   --   --  2.81* 4.32*  CALCIUM  --   --   --  8.2* 8.3*  MG  --   --   --  2.0  --   PHOS 7.0* 5.9* 4.5  --   --      CBC:  Recent Labs Lab 07/28/15 0456 07/29/15 0418  WBC 12.6* 15.3*  HGB 8.2* 8.3*  HCT 27.6* 27.4*  MCV 87.2 86.6  PLT 282 294      Microbiology:  Recent Results (from the past 720 hour(s))  Culture, blood (routine x 2)      Status: None   Collection Time: 07/22/15  6:21 PM  Result Value Ref Range Status   Specimen Description BLOOD LEFT HAND  Final   Special Requests BOTTLES DRAWN AEROBIC AND ANAEROBIC  4CC  Final   Culture  Setup Time   Final    GRAM POSITIVE COCCI IN CLUSTERS AEROBIC BOTTLE ONLY CRITICAL RESULT CALLED TO, READ BACK BY AND VERIFIED WITH: CRYSTAL SCARPENA 07/23/15 1330 MLM    Culture   Final    METHICILLIN RESISTANT STAPHYLOCOCCUS AUREUS AEROBIC BOTTLE ONLY    Report Status 07/27/2015 FINAL  Final   Organism ID, Bacteria METHICILLIN RESISTANT STAPHYLOCOCCUS AUREUS  Final      Susceptibility   Methicillin resistant staphylococcus aureus - MIC*    CIPROFLOXACIN >=8 RESISTANT Resistant     ERYTHROMYCIN <=  0.25 SENSITIVE Sensitive     GENTAMICIN <=0.5 SENSITIVE Sensitive     OXACILLIN >=4 RESISTANT Resistant     VANCOMYCIN 1 SENSITIVE Sensitive     TRIMETH/SULFA <=10 SENSITIVE Sensitive     CLINDAMYCIN <=0.25 SENSITIVE Sensitive     CEFOXITIN SCREEN Value in next row Resistant      POSITIVECEFOXITIN SCREEN - This test may be used to predict mecA-mediated oxacillin resistance, and it is based on the cefoxitin disk screen test.  The cefoxitin screen and oxacillin work in combination to determine the final interpretation reported for oxacillin.     Inducible Clindamycin Value in next row Sensitive      POSITIVECEFOXITIN SCREEN - This test may be used to predict mecA-mediated oxacillin resistance, and it is based on the cefoxitin disk screen test.  The cefoxitin screen and oxacillin work in combination to determine the final interpretation reported for oxacillin.     TETRACYCLINE Value in next row Sensitive      SENSITIVE<=1    * METHICILLIN RESISTANT STAPHYLOCOCCUS AUREUS  Culture, blood (routine x 2)     Status: None   Collection Time: 07/22/15  6:21 PM  Result Value Ref Range Status   Specimen Description BLOOD LEFT HAND  Final   Special Requests   Final    BOTTLES DRAWN AEROBIC AND  ANAEROBIC  AER 2CC ANA 4CC   Culture  Setup Time   Final    GRAM POSITIVE COCCI IN CLUSTERS IN BOTH AEROBIC AND ANAEROBIC BOTTLES CRITICAL RESULT CALLED TO, READ BACK BY AND VERIFIED WITH: CRYSTAL SCARPENA 07/23/15 1400 MLM    Culture METHICILLIN RESISTANT STAPHYLOCOCCUS AUREUS  Final   Report Status 07/27/2015 FINAL  Final   Organism ID, Bacteria METHICILLIN RESISTANT STAPHYLOCOCCUS AUREUS  Final      Susceptibility   Methicillin resistant staphylococcus aureus - MIC*    CIPROFLOXACIN >=8 RESISTANT Resistant     ERYTHROMYCIN 0.5 SENSITIVE Sensitive     GENTAMICIN <=0.5 SENSITIVE Sensitive     OXACILLIN >=4 RESISTANT Resistant     VANCOMYCIN 1 SENSITIVE Sensitive     TRIMETH/SULFA <=10 SENSITIVE Sensitive     CLINDAMYCIN <=0.25 SENSITIVE Sensitive     CEFOXITIN SCREEN Value in next row Resistant      POSITIVECEFOXITIN SCREEN - This test may be used to predict mecA-mediated oxacillin resistance, and it is based on the cefoxitin disk screen test.  The cefoxitin screen and oxacillin work in combination to determine the final interpretation reported for oxacillin.     Inducible Clindamycin Value in next row Sensitive      POSITIVECEFOXITIN SCREEN - This test may be used to predict mecA-mediated oxacillin resistance, and it is based on the cefoxitin disk screen test.  The cefoxitin screen and oxacillin work in combination to determine the final interpretation reported for oxacillin.     TETRACYCLINE Value in next row Sensitive      SENSITIVE<=1    * METHICILLIN RESISTANT STAPHYLOCOCCUS AUREUS  Blood Culture ID Panel (Reflexed)     Status: Abnormal   Collection Time: 07/22/15  6:21 PM  Result Value Ref Range Status   Enterococcus species NOT DETECTED NOT DETECTED Final   Listeria monocytogenes NOT DETECTED NOT DETECTED Final   Staphylococcus species DETECTED (A) NOT DETECTED Final   Staphylococcus aureus DETECTED (A) NOT DETECTED Final    Comment: CRITICAL RESULT CALLED TO, READ BACK BY AND  VERIFIED WITH: CRYSTAL SCARPENA 07/23/15 1330 ABOUT STAPH SPECIES,STAPH AUREUS, AND MecA DETECTED MLM    Streptococcus  species NOT DETECTED NOT DETECTED Final   Streptococcus agalactiae NOT DETECTED NOT DETECTED Final   Streptococcus pneumoniae NOT DETECTED NOT DETECTED Final   Streptococcus pyogenes NOT DETECTED NOT DETECTED Final   Acinetobacter baumannii NOT DETECTED NOT DETECTED Final   Enterobacteriaceae species NOT DETECTED NOT DETECTED Final   Enterobacter cloacae complex NOT DETECTED NOT DETECTED Final   Escherichia coli NOT DETECTED NOT DETECTED Final   Klebsiella oxytoca NOT DETECTED NOT DETECTED Final   Klebsiella pneumoniae NOT DETECTED NOT DETECTED Final   Proteus species NOT DETECTED NOT DETECTED Final   Serratia marcescens NOT DETECTED NOT DETECTED Final   Haemophilus influenzae NOT DETECTED NOT DETECTED Final   Neisseria meningitidis NOT DETECTED NOT DETECTED Final   Pseudomonas aeruginosa NOT DETECTED NOT DETECTED Final   Candida albicans NOT DETECTED NOT DETECTED Final   Candida glabrata NOT DETECTED NOT DETECTED Final   Candida krusei NOT DETECTED NOT DETECTED Final   Candida parapsilosis NOT DETECTED NOT DETECTED Final   Candida tropicalis NOT DETECTED NOT DETECTED Final   Carbapenem resistance NOT DETECTED NOT DETECTED Final   Methicillin resistance DETECTED (A) NOT DETECTED Final   Vancomycin resistance NOT DETECTED NOT DETECTED Final  MRSA PCR Screening     Status: Abnormal   Collection Time: 07/23/15  5:24 AM  Result Value Ref Range Status   MRSA by PCR POSITIVE (A) NEGATIVE Final    Comment:        The GeneXpert MRSA Assay (FDA approved for NASAL specimens only), is one component of a comprehensive MRSA colonization surveillance program. It is not intended to diagnose MRSA infection nor to guide or monitor treatment for MRSA infections. CRITICAL RESULT CALLED TO, READ BACK BY AND VERIFIED WITH: ALISHA BABB ON 07/23/15 AT 0735 BY QSD   CULTURE, BLOOD  (ROUTINE X 2) w Reflex to PCR ID Panel     Status: None   Collection Time: 07/25/15  6:08 AM  Result Value Ref Range Status   Specimen Description BLOOD LEFT AC  Final   Special Requests BOTTLES DRAWN AEROBIC AND ANAEROBIC ANA2ML AER3ML  Final   Culture NO GROWTH 5 DAYS  Final   Report Status 07/30/2015 FINAL  Final  CULTURE, BLOOD (ROUTINE X 2) w Reflex to PCR ID Panel     Status: None   Collection Time: 07/25/15  6:08 AM  Result Value Ref Range Status   Specimen Description BLOOD LEFT WRIST  Final   Special Requests   Final    BOTTLES DRAWN AEROBIC AND ANAEROBIC ANA AER   Culture NO GROWTH 5 DAYS  Final   Report Status 07/30/2015 FINAL  Final  Cath Tip Culture     Status: None (Preliminary result)   Collection Time: 07/28/15  7:32 PM  Result Value Ref Range Status   Specimen Description CATH TIP  Final   Special Requests NONE  Final   Culture NO GROWTH 2 DAYS  Final   Report Status PENDING  Incomplete    Coagulation Studies: No results for input(s): LABPROT, INR in the last 72 hours.  Urinalysis: No results for input(s): COLORURINE, LABSPEC, PHURINE, GLUCOSEU, HGBUR, BILIRUBINUR, KETONESUR, PROTEINUR, UROBILINOGEN, NITRITE, LEUKOCYTESUR in the last 72 hours.  Invalid input(s): APPERANCEUR    Imaging: No results found.   Medications:     . allopurinol  100 mg Oral Daily  . aspirin EC  81 mg Oral Daily  . atorvastatin  20 mg Oral Daily  . clopidogrel  75 mg Oral Daily  .  diltiazem  240 mg Oral Daily  . epoetin (EPOGEN/PROCRIT) injection  10,000 Units Intravenous Q T,Th,Sa-HD  . ferrous sulfate  325 mg Oral Daily  . heparin  5,000 Units Subcutaneous 3 times per day  . latanoprost  1 drop Both Eyes QHS  . metoprolol  5 mg Intravenous 4 times per day  . metoprolol tartrate  25 mg Oral BID  . piperacillin-tazobactam (ZOSYN)  IV  3.375 g Intravenous Q12H  . pregabalin  75 mg Oral Daily  . senna  17.2 mg Oral QHS  . [START ON 08/01/2015] vancomycin  750 mg  Intravenous Q T,Th,Sa-HD   sodium chloride, sodium chloride, benzonatate, heparin, lidocaine (PF), lidocaine-prilocaine, naLOXone (NARCAN)  injection, oxyCODONE-acetaminophen, pentafluoroprop-tetrafluoroeth  Assessment/ Plan:  82 y.o. black male  with right eye blindness, diastolic congestive heart failure, hypertension, gout, glaucoma, peripheral neuropathy, atrial fibrillation, anemia, carotid stenosis status post bilateral CEA, CVA, peripheral vascular disease   [facility]/ TTS  1. End Stage Renal Disease: last hemodialysis on Saturday. No acute indication for dialysis. Next treatment for Tuesday. Continue TTS schedule. Complication of hemodialysis access: tunneled catheter has been dislodged. Now temp femoarl line placed. Will need tunneled catheter before discharge.  - MRSA sepsis. Cath tip culture and blood cultures with no growth from 07/24/14. Currently on vanco and pip/tazo.   2. Anemia of chronic kidney disease: Hemoglobin down to 8.3. - continue epogen 10000 units IV with HD.   3. Secondary Hyperparathyroidism: not currently on a binder or vitamin D agent.  - most recent calcium and phosphorus at goal.   4.  Hypertension: hypotensive due to sepsis.  - metoprolol and diltiazem.    LOS: 9 Sean Delgado 1/23/201710:26 AM

## 2015-07-31 NOTE — Progress Notes (Signed)
[facility] CLINIC INFECTIOUS DISEASE PROGRESS NOTE Date of Admission:  07/22/2015     ID: Sean Delgado. is a 82 y.o. male with  MRSA bacteremia, HD patient  Active Problems:   Acute respiratory failure (HCC)   Sepsis (HCC)   Aspiration pneumonia (HCC)   Malnutrition of moderate degree   Subjective: Some diarrhea this am- 3 loose stools per RN but patient denies Very lethargic   ROS  Eleven systems are reviewed and negative except per hpi  Medications:  Antibiotics Given (last 72 hours)    Date/Time Action Medication Dose Rate   07/28/15 1715 Given   piperacillin-tazobactam (ZOSYN) IVPB 3.375 g 3.375 g 12.5 mL/hr   07/29/15 1100 Given   piperacillin-tazobactam (ZOSYN) IVPB 3.375 g 3.375 g 12.5 mL/hr   07/29/15 2215 Given  [given during dialysis]   vancomycin (VANCOCIN) IVPB 750 mg/150 ml premix 750 mg 150 mL/hr   07/30/15 1200 Given   piperacillin-tazobactam (ZOSYN) IVPB 3.375 g 3.375 g 12.5 mL/hr   07/30/15 2241 Given   piperacillin-tazobactam (ZOSYN) IVPB 3.375 g 3.375 g 12.5 mL/hr   07/31/15 1138 Given   piperacillin-tazobactam (ZOSYN) IVPB 3.375 g 3.375 g 12.5 mL/hr   07/31/15 1359 Given   vancomycin (VANCOCIN) 50 mg/mL oral solution 250 mg 250 mg      . allopurinol  100 mg Oral Daily  . aspirin EC  81 mg Oral Daily  . atorvastatin  20 mg Oral Daily  . clopidogrel  75 mg Oral Daily  . diltiazem  240 mg Oral Daily  . epoetin (EPOGEN/PROCRIT) injection  10,000 Units Intravenous Q T,Th,Sa-HD  . ferrous sulfate  325 mg Oral Daily  . heparin  5,000 Units Subcutaneous 3 times per day  . latanoprost  1 drop Both Eyes QHS  . metoprolol tartrate  25 mg Oral BID  . pregabalin  75 mg Oral Daily  . vancomycin  250 mg Oral 4 times per day  . [START ON 08/01/2015] vancomycin  750 mg Intravenous Q T,Th,Sa-HD    Objective: Vital signs in last 24 hours: Temp:  [97.9 F (36.6 C)-98.7 F (37.1 C)] 98.7 F (37.1 C) (01/23 1249) Pulse Rate:  [62-119] 104 (01/23 1249) Resp:   [16-27] 16 (01/23 1249) BP: (95-122)/(37-104) 101/37 mmHg (01/23 1249) SpO2:  [84 %-97 %] 91 % (01/23 1249) Weight:  [91.491 kg (201 lb 11.2 oz)] 91.491 kg (201 lb 11.2 oz) (01/23 0023) Constitutional: He is oriented to person, place, and time. Appears chronically ill HENT: anicteric Mouth/Throat: Oropharynx is clear and dry. No oropharyngeal exudate.  Cardiovascular: Normal rate, regular rhythm and normal heart sounds. Distant HD Pulmonary/Chest: Effort normal and breath sounds normal. No respiratory distress. He has no wheezes.  Abdominal: Soft. Bowel sounds are normal. He exhibits no distension. There is no tenderness.  Lymphadenopathy: He has no cervical adenopathy.  Neurological: He is alert and oriented to person, place, and time.  Skin: dry scaly skin  Psychiatric: He has a normal mood and affect. His behavior is normal.  L chest wall HD cath site covered- nontender R groin temp cath in place  Lab Results  Recent Labs  07/29/15 0418  WBC 15.3*  HGB 8.3*  HCT 27.4*  NA 144  K 4.8  CL 107  CO2 29  BUN 24*  CREATININE 4.32*    Microbiology: Results for orders placed or performed during the hospital encounter of 07/22/15  Culture, blood (routine x 2)     Status: None   Collection Time: 07/22/15  6:21 PM  Result Value Ref Range Status   Specimen Description BLOOD LEFT HAND  Final   Special Requests BOTTLES DRAWN AEROBIC AND ANAEROBIC  4CC  Final   Culture  Setup Time   Final    GRAM POSITIVE COCCI IN CLUSTERS AEROBIC BOTTLE ONLY CRITICAL RESULT CALLED TO, READ BACK BY AND VERIFIED WITH: CRYSTAL [facility] 07/23/15 1330 MLM    Culture   Final    METHICILLIN RESISTANT STAPHYLOCOCCUS AUREUS AEROBIC BOTTLE ONLY    Report Status 07/27/2015 FINAL  Final   Organism ID, Bacteria METHICILLIN RESISTANT STAPHYLOCOCCUS AUREUS  Final      Susceptibility   Methicillin resistant staphylococcus aureus - MIC*    CIPROFLOXACIN >=8 RESISTANT Resistant     ERYTHROMYCIN <=0.25  SENSITIVE Sensitive     GENTAMICIN <=0.5 SENSITIVE Sensitive     OXACILLIN >=4 RESISTANT Resistant     VANCOMYCIN 1 SENSITIVE Sensitive     TRIMETH/SULFA <=10 SENSITIVE Sensitive     CLINDAMYCIN <=0.25 SENSITIVE Sensitive     CEFOXITIN SCREEN Value in next row Resistant      POSITIVECEFOXITIN SCREEN - This test may be used to predict mecA-mediated oxacillin resistance, and it is based on the cefoxitin disk screen test.  The cefoxitin screen and oxacillin work in combination to determine the final interpretation reported for oxacillin.     Inducible Clindamycin Value in next row Sensitive      POSITIVECEFOXITIN SCREEN - This test may be used to predict mecA-mediated oxacillin resistance, and it is based on the cefoxitin disk screen test.  The cefoxitin screen and oxacillin work in combination to determine the final interpretation reported for oxacillin.     TETRACYCLINE Value in next row Sensitive      SENSITIVE<=1    * METHICILLIN RESISTANT STAPHYLOCOCCUS AUREUS  Culture, blood (routine x 2)     Status: None   Collection Time: 07/22/15  6:21 PM  Result Value Ref Range Status   Specimen Description BLOOD LEFT HAND  Final   Special Requests   Final    BOTTLES DRAWN AEROBIC AND ANAEROBIC  AER 2CC ANA 4CC   Culture  Setup Time   Final    GRAM POSITIVE COCCI IN CLUSTERS IN BOTH AEROBIC AND ANAEROBIC BOTTLES CRITICAL RESULT CALLED TO, READ BACK BY AND VERIFIED WITH: CRYSTAL SCARPENA 07/23/15 1400 MLM    Culture METHICILLIN RESISTANT STAPHYLOCOCCUS AUREUS  Final   Report Status 07/27/2015 FINAL  Final   Organism ID, Bacteria METHICILLIN RESISTANT STAPHYLOCOCCUS AUREUS  Final      Susceptibility   Methicillin resistant staphylococcus aureus - MIC*    CIPROFLOXACIN >=8 RESISTANT Resistant     ERYTHROMYCIN 0.5 SENSITIVE Sensitive     GENTAMICIN <=0.5 SENSITIVE Sensitive     OXACILLIN >=4 RESISTANT Resistant     VANCOMYCIN 1 SENSITIVE Sensitive     TRIMETH/SULFA <=10 SENSITIVE Sensitive      CLINDAMYCIN <=0.25 SENSITIVE Sensitive     CEFOXITIN SCREEN Value in next row Resistant      POSITIVECEFOXITIN SCREEN - This test may be used to predict mecA-mediated oxacillin resistance, and it is based on the cefoxitin disk screen test.  The cefoxitin screen and oxacillin work in combination to determine the final interpretation reported for oxacillin.     Inducible Clindamycin Value in next row Sensitive      POSITIVECEFOXITIN SCREEN - This test may be used to predict mecA-mediated oxacillin resistance, and it is based on the cefoxitin disk screen test.  The cefoxitin screen and  oxacillin work in combination to determine the final interpretation reported for oxacillin.     TETRACYCLINE Value in next row Sensitive      SENSITIVE<=1    * METHICILLIN RESISTANT STAPHYLOCOCCUS AUREUS  Blood Culture ID Panel (Reflexed)     Status: Abnormal   Collection Time: 07/22/15  6:21 PM  Result Value Ref Range Status   Enterococcus species NOT DETECTED NOT DETECTED Final   Listeria monocytogenes NOT DETECTED NOT DETECTED Final   Staphylococcus species DETECTED (A) NOT DETECTED Final   Staphylococcus aureus DETECTED (A) NOT DETECTED Final    Comment: CRITICAL RESULT CALLED TO, READ BACK BY AND VERIFIED WITH: CRYSTAL SCARPENA 07/23/15 1330 ABOUT STAPH SPECIES,STAPH AUREUS, AND MecA DETECTED MLM    Streptococcus species NOT DETECTED NOT DETECTED Final   Streptococcus agalactiae NOT DETECTED NOT DETECTED Final   Streptococcus pneumoniae NOT DETECTED NOT DETECTED Final   Streptococcus pyogenes NOT DETECTED NOT DETECTED Final   Acinetobacter baumannii NOT DETECTED NOT DETECTED Final   Enterobacteriaceae species NOT DETECTED NOT DETECTED Final   Enterobacter cloacae complex NOT DETECTED NOT DETECTED Final   Escherichia coli NOT DETECTED NOT DETECTED Final   Klebsiella oxytoca NOT DETECTED NOT DETECTED Final   Klebsiella pneumoniae NOT DETECTED NOT DETECTED Final   Proteus species NOT DETECTED NOT DETECTED  Final   Serratia marcescens NOT DETECTED NOT DETECTED Final   Haemophilus influenzae NOT DETECTED NOT DETECTED Final   Neisseria meningitidis NOT DETECTED NOT DETECTED Final   Pseudomonas aeruginosa NOT DETECTED NOT DETECTED Final   Candida albicans NOT DETECTED NOT DETECTED Final   Candida glabrata NOT DETECTED NOT DETECTED Final   Candida krusei NOT DETECTED NOT DETECTED Final   Candida parapsilosis NOT DETECTED NOT DETECTED Final   Candida tropicalis NOT DETECTED NOT DETECTED Final   Carbapenem resistance NOT DETECTED NOT DETECTED Final   Methicillin resistance DETECTED (A) NOT DETECTED Final   Vancomycin resistance NOT DETECTED NOT DETECTED Final  MRSA PCR Screening     Status: Abnormal   Collection Time: 07/23/15  5:24 AM  Result Value Ref Range Status   MRSA by PCR POSITIVE (A) NEGATIVE Final    Comment:        The GeneXpert MRSA Assay (FDA approved for NASAL specimens only), is one component of a comprehensive MRSA colonization surveillance program. It is not intended to diagnose MRSA infection nor to guide or monitor treatment for MRSA infections. CRITICAL RESULT CALLED TO, READ BACK BY AND VERIFIED WITH: ALISHA BABB ON 07/23/15 AT 0735 BY QSD   CULTURE, BLOOD (ROUTINE X 2) w Reflex to PCR ID Panel     Status: None   Collection Time: 07/25/15  6:08 AM  Result Value Ref Range Status   Specimen Description BLOOD LEFT AC  Final   Special Requests BOTTLES DRAWN AEROBIC AND ANAEROBIC ANA2ML AER3ML  Final   Culture NO GROWTH 5 DAYS  Final   Report Status 07/30/2015 FINAL  Final  CULTURE, BLOOD (ROUTINE X 2) w Reflex to PCR ID Panel     Status: None   Collection Time: 07/25/15  6:08 AM  Result Value Ref Range Status   Specimen Description BLOOD LEFT WRIST  Final   Special Requests   Final    BOTTLES DRAWN AEROBIC AND ANAEROBIC ANA AER   Culture NO GROWTH 5 DAYS  Final   Report Status 07/30/2015 FINAL  Final  Cath Tip Culture     Status: None   Collection Time:  07/28/15  7:32  PM  Result Value Ref Range Status   Specimen Description CATH TIP  Final   Special Requests NONE  Final   Culture NO GROWTH 3 DAYS  Final   Report Status 07/31/2015 FINAL  Final  C difficile quick scan w PCR reflex     Status: Abnormal   Collection Time: 07/31/15 10:28 AM  Result Value Ref Range Status   C Diff antigen POSITIVE (A) NEGATIVE Final   C Diff toxin NEGATIVE NEGATIVE Final   C Diff interpretation   Final    Positive for toxigenic C. difficile, active toxin production not detected. Patient has toxigenic C. difficile organisms present in the bowel, but toxin was not detected. The patient may be a carrier or the level of toxin in the sample was below the limit  of detection. This information should be used in conjunction with the patient's clinical history when deciding on possible therapy.   Clostridium Difficile by PCR     Status: Abnormal   Collection Time: 07/31/15 10:28 AM  Result Value Ref Range Status   Toxigenic C Difficile by pcr POSITIVE (A) NEGATIVE Final    Comment: CRITICAL RESULT CALLED TO, READ BACK BY AND VERIFIED WITH: Hyman Hopes 07/31/15 AT 1211 VKB     Studies/Results:  Dg Chest 1 View  07/28/2015  CLINICAL DATA:  82 year old male with chest pain and shortness of breath EXAM: CHEST 1 VIEW COMPARISON:  Radiograph dated 07/28/2015 FINDINGS: Single-view of the chest demonstrates cardiomegaly. The lungs are hypovolemic. There bilateral lower lobe opacities with no significant interval change compared to prior study. There is no pneumothorax. No acute osseous pathology. IMPRESSION: No significant interval change. Bilateral lower lung field airspace opacities, right greater than left. Electronically Signed   By: Elgie Collard M.D.   On: 07/28/2015 21:12   Dg Chest 1 View  07/28/2015  CLINICAL DATA:  Cough. EXAM: CHEST 1 VIEW COMPARISON:  07/22/2015 FINDINGS: Bilateral lower lobe airspace opacities are noted, increasing on the right since prior  study, similar on the left. Small bilateral pleural effusions. Cardiomegaly with vascular congestion. IMPRESSION: Bilateral lower lobe airspace opacities, worsening on the right. Cannot exclude pneumonia. Bilateral effusions. Mild cardiomegaly. Electronically Signed   By: Charlett Nose M.D.   On: 07/28/2015 16:46   Dg Chest 1 View  07/22/2015  CLINICAL DATA:  Pt with end stage renal disease. Pt to ED via EMS from [facility] c/o dialysis catheter having been pulled out approximately 5-6 inches. Pt is verbally unresponsive except to basic commands. EXAM: CHEST  1 VIEW COMPARISON:  06/24/2015 FINDINGS: The tunneled left IJ hemodialysis catheter has its tips in the proximal right atrium. No pneumothorax. Confluent Opacities in the left mid and lower lung probably combination of effusion and adjacent parenchymal consolidation/ atelectasis. Right lung clear. Heart size difficult to assess due to adjacent opacities. No effusion. Atheromatous aorta. IMPRESSION: 1. Left IJ dialysis catheter projects in expected location. 2. Left lower lung consolidation/atelectasis with effusion. Electronically Signed   By: Corlis Leak M.D.   On: 07/22/2015 14:35   Dg Hip Port Unilat With Pelvis 1v Right  07/23/2015  CLINICAL DATA:  Pt is in renal stage failure. Unable to transport downstairs so to be done portable. Best images obtained due to condition. Previous MR right hip on 11-25. Pt is complaining of pain and is unable to move in any way. EXAM: DG HIP (WITH OR WITHOUT PELVIS) 1V PORT RIGHT COMPARISON:  None. FINDINGS: Hips are located. No evidence of pelvic  fracture or sacral fracture. Dedicated view of the RIGHT hip demonstrates no femoral neck fracture. IMPRESSION: No fracture or dislocation. Electronically Signed   By: Genevive Bi M.D.   On: 07/23/2015 14:45   Study Conclusions  - Left ventricle: There was mild concentric hypertrophy. Systolic function was normal. The estimated ejection fraction was in  the range of 55% to 65%. - Aortic valve: Valve area (Vmax): 2.61 cm^2. - Mitral valve: There was mild regurgitation.  Assessment:  Sean Delgado. is a 82 y.o. male with ESRD on HD, admitted with lethargy and found to have MRSA bacteremia likely related to his neck line which had backed out some. He has LLL consolidation/atelectasis as well. Was initially on vanco/zosyn but then zosyn dced. Restarted on zosyn on 1/20 since he had another fever and some cough.  Unable to have permacath placed due to this.  He has seen SS and is being fed by nursing nectar thick liquids.  I think very high aspiration risk C diff + now. He is lethargic and during my exam was coughing and seem to be aspirating his secretions  Recommendations Cont vanco IV for the MRSA bactermia Will stop zosyn for possible HCAP - received a 7 day course  Although this is all likely aspiration and will likely recur Repeat bcx is neg - when stable  can replace permacath Will need min 2 weeks IV vanco - can be given at HD Change oral vanco to oral flagyl for C diff I would suggest palliative care consult since unlikely to have much recovery and will likely have repeated aspiration events.  Thank you very much for the consult. Will follow with you.  FITZGERALD, DAVID   07/31/2015, 2:58 PM

## 2015-07-31 NOTE — Progress Notes (Signed)
Pt transferred to 2A from CCU. Pt is alert, in no distress, VSS. Skin assessed with Deatra James, RN. Will continue to monitor and treat per MD orders.

## 2015-07-31 NOTE — Progress Notes (Signed)
Dr. Orvan Falconer notified of palliative consult. MD to round on patient, probably tomorrow.

## 2015-07-31 NOTE — Progress Notes (Signed)
Pharmacy Antibiotic Follow-up Note  Sean Delgado. is a 82 y.o. year-old male admitted on 07/22/2015.  The patient is currently on day 8 of Vancomycin   for MRSA bacteremia and aspiration pneumonia.  Assessment/Plan: Patient received HD on 1/16, 1/18 and today on 1/19.  No Vancomycin doses administered per MAR.  Spoke with HD RN and no Vancomycin was given today after HD.  Will order Vancomycin 1 gm IV once to be given as supplemental dose after HD. Trough prior to HD on 1/21.  1/20: Vancomycin trough level scheduled to be drawn prior to dialysis on 1/21.l   1/23: Vancomycin dose decreased to 750 mg each dialysis. Trough level ordered to be drawn prior to the dialysis session on 1/28    Temp (24hrs), Avg:98.2 F (36.8 C), Min:97.9 F (36.6 C), Max:98.6 F (37 C)   Recent Labs Lab 07/28/15 0456 07/29/15 0418  WBC 12.6* 15.3*     Recent Labs Lab 07/28/15 0456 07/29/15 0418  CREATININE 2.81* 4.32*   Estimated Creatinine Clearance: 15.7 mL/min (by C-G formula based on Cr of 4.32).    No Known Allergies  Antimicrobials this admission: Vanc 1/14 >> Unasyn 1/16 >>1/17 Zosyn 1/14 >> 1/16   Levels/dose changes this admission:   Microbiology results: 1/14 BCx: Staph Aureus mecA gene positive  1/14 MRSA PCR: positive  1/21:  Vancomycin trough = 26 mcg/mL Will decrease maintenance dose to Vancomycin 750 mg IV Q MWF -HD.  Will recheck Vanc trough before next 3rd HD session  on 1/28.   Demetrius Charity PharmD 07/31/2015 10:25 AM

## 2015-07-31 NOTE — Progress Notes (Addendum)
Dr. Renae Gloss notified that patient has been having loose stools. Order for rule-out c.diff protocol even tho patient has received senokot at bedtime - instructed to d/c this order.

## 2015-08-01 LAB — CBC
HCT: 28.1 % — ABNORMAL LOW (ref 40.0–52.0)
Hemoglobin: 8.2 g/dL — ABNORMAL LOW (ref 13.0–18.0)
MCH: 25.3 pg — AB (ref 26.0–34.0)
MCHC: 29.4 g/dL — ABNORMAL LOW (ref 32.0–36.0)
MCV: 86 fL (ref 80.0–100.0)
Platelets: 341 10*3/uL (ref 150–440)
RBC: 3.26 MIL/uL — AB (ref 4.40–5.90)
RDW: 22.2 % — AB (ref 11.5–14.5)
WBC: 9.5 10*3/uL (ref 3.8–10.6)

## 2015-08-01 LAB — RENAL FUNCTION PANEL
Albumin: 1.7 g/dL — ABNORMAL LOW (ref 3.5–5.0)
Anion gap: 10 (ref 5–15)
BUN: 41 mg/dL — AB (ref 6–20)
CALCIUM: 8.2 mg/dL — AB (ref 8.9–10.3)
CHLORIDE: 98 mmol/L — AB (ref 101–111)
CO2: 30 mmol/L (ref 22–32)
CREATININE: 5.73 mg/dL — AB (ref 0.61–1.24)
GFR calc non Af Amer: 8 mL/min — ABNORMAL LOW (ref >60)
GFR, EST AFRICAN AMERICAN: 10 mL/min — AB (ref >60)
GLUCOSE: 109 mg/dL — AB (ref 65–99)
Phosphorus: 5 mg/dL — ABNORMAL HIGH (ref 2.5–4.6)
Potassium: 3.7 mmol/L (ref 3.5–5.1)
SODIUM: 138 mmol/L (ref 135–145)

## 2015-08-01 MED ORDER — CEFAZOLIN SODIUM 1-5 GM-% IV SOLN
1.0000 g | INTRAVENOUS | Status: AC
  Filled 2015-08-01: qty 50

## 2015-08-01 NOTE — Progress Notes (Signed)
Patient resting quietly post-dialysis. Daughter at bedside feeding patient. No complaints at this time. Will continue to monitor.

## 2015-08-01 NOTE — Progress Notes (Signed)
Pre-hd tx 

## 2015-08-01 NOTE — Progress Notes (Signed)
Hemodialysis start 

## 2015-08-01 NOTE — Progress Notes (Signed)
[facility] CLINIC INFECTIOUS DISEASE PROGRESS NOTE Date of Admission:  07/22/2015     ID: Sean Delgado. is a 82 y.o. male with  MRSA bacteremia, HD patient  Active Problems:   Acute respiratory failure (HCC)   Sepsis (HCC)   Aspiration pneumonia (HCC)   Malnutrition of moderate degree   Subjective: Seen on HD, no complaints. Denies diarrhea  ROS  Eleven systems are reviewed and negative except per hpi  Medications:  Antibiotics Given (last 72 hours)    Date/Time Action Medication Dose Rate   07/29/15 2215 Given  [given during dialysis]   vancomycin (VANCOCIN) IVPB 750 mg/150 ml premix 750 mg 150 mL/hr   07/30/15 1200 Given   piperacillin-tazobactam (ZOSYN) IVPB 3.375 g 3.375 g 12.5 mL/hr   07/30/15 2241 Given   piperacillin-tazobactam (ZOSYN) IVPB 3.375 g 3.375 g 12.5 mL/hr   07/31/15 1138 Given   piperacillin-tazobactam (ZOSYN) IVPB 3.375 g 3.375 g 12.5 mL/hr   07/31/15 1359 Given   vancomycin (VANCOCIN) 50 mg/mL oral solution 250 mg 250 mg    07/31/15 1638 Given   metroNIDAZOLE (FLAGYL) tablet 500 mg 500 mg    07/31/15 2251 Given   metroNIDAZOLE (FLAGYL) tablet 500 mg 500 mg    08/01/15 0500 Given   metroNIDAZOLE (FLAGYL) tablet 500 mg 500 mg    08/01/15 1310 Given   metroNIDAZOLE (FLAGYL) tablet 500 mg 500 mg    08/01/15 1527 Given  [Given last hour of hemodialysis treatment.]   vancomycin (VANCOCIN) IVPB 750 mg/150 ml premix 750 mg 150 mL/hr     . allopurinol  100 mg Oral Daily  . aspirin EC  81 mg Oral Daily  . atorvastatin  20 mg Oral Daily  . clopidogrel  75 mg Oral Daily  . diltiazem  240 mg Oral Daily  . epoetin (EPOGEN/PROCRIT) injection  10,000 Units Intravenous Q T,Th,Sa-HD  . ferrous sulfate  325 mg Oral Daily  . heparin  5,000 Units Subcutaneous 3 times per day  . latanoprost  1 drop Both Eyes QHS  . metoprolol tartrate  25 mg Oral BID  . metroNIDAZOLE  500 mg Oral 3 times per day  . pregabalin  75 mg Oral Daily  . vancomycin  750 mg Intravenous Q  T,Th,Sa-HD    Objective: Vital signs in last 24 hours: Temp:  [97.1 F (36.2 C)-98.3 F (36.8 C)] 97.1 F (36.2 C) (01/24 1315) Pulse Rate:  [73-98] 89 (01/24 1500) Resp:  [14-18] 14 (01/24 1500) BP: (91-120)/(39-54) 104/52 mmHg (01/24 1500) SpO2:  [87 %-95 %] 91 % (01/24 1315) Weight:  [92.8 kg (204 lb 9.4 oz)] 92.8 kg (204 lb 9.4 oz) (01/24 1315) Constitutional: He is oriented to person, place, and time. Appears chronically ill HENT: anicteric Mouth/Throat: Oropharynx is clear and dry. No oropharyngeal exudate.  Cardiovascular: Normal rate, regular rhythm and normal heart sounds. Distant HD Pulmonary/Chest: Effort normal and breath sounds normal. No respiratory distress. He has no wheezes.  Abdominal: Soft. Bowel sounds are normal. He exhibits no distension. There is no tenderness.  Lymphadenopathy: He has no cervical adenopathy.  Neurological: He is alert and oriented to person, place, and time.  Skin: dry scaly skin  Psychiatric: He has a normal mood and affect. His behavior is normal.  L chest wall HD cath site covered- nontender R groin temp cath in place  Lab Results  Recent Labs  08/01/15 0927  WBC 9.5  HGB 8.2*  HCT 28.1*  NA 138  K 3.7  CL 98*  CO2 30  BUN 41*  CREATININE 5.73*    Microbiology: Results for orders placed or performed during the hospital encounter of 07/22/15  Culture, blood (routine x 2)     Status: None   Collection Time: 07/22/15  6:21 PM  Result Value Ref Range Status   Specimen Description BLOOD LEFT HAND  Final   Special Requests BOTTLES DRAWN AEROBIC AND ANAEROBIC  4CC  Final   Culture  Setup Time   Final    GRAM POSITIVE COCCI IN CLUSTERS AEROBIC BOTTLE ONLY CRITICAL RESULT CALLED TO, READ BACK BY AND VERIFIED WITH: CRYSTAL SCARPENA 07/23/15 1330 MLM    Culture   Final    METHICILLIN RESISTANT STAPHYLOCOCCUS AUREUS AEROBIC BOTTLE ONLY    Report Status 07/27/2015 FINAL  Final   Organism ID, Bacteria METHICILLIN RESISTANT  STAPHYLOCOCCUS AUREUS  Final      Susceptibility   Methicillin resistant staphylococcus aureus - MIC*    CIPROFLOXACIN >=8 RESISTANT Resistant     ERYTHROMYCIN <=0.25 SENSITIVE Sensitive     GENTAMICIN <=0.5 SENSITIVE Sensitive     OXACILLIN >=4 RESISTANT Resistant     VANCOMYCIN 1 SENSITIVE Sensitive     TRIMETH/SULFA <=10 SENSITIVE Sensitive     CLINDAMYCIN <=0.25 SENSITIVE Sensitive     CEFOXITIN SCREEN Value in next row Resistant      POSITIVECEFOXITIN SCREEN - This test may be used to predict mecA-mediated oxacillin resistance, and it is based on the cefoxitin disk screen test.  The cefoxitin screen and oxacillin work in combination to determine the final interpretation reported for oxacillin.     Inducible Clindamycin Value in next row Sensitive      POSITIVECEFOXITIN SCREEN - This test may be used to predict mecA-mediated oxacillin resistance, and it is based on the cefoxitin disk screen test.  The cefoxitin screen and oxacillin work in combination to determine the final interpretation reported for oxacillin.     TETRACYCLINE Value in next row Sensitive      SENSITIVE<=1    * METHICILLIN RESISTANT STAPHYLOCOCCUS AUREUS  Culture, blood (routine x 2)     Status: None   Collection Time: 07/22/15  6:21 PM  Result Value Ref Range Status   Specimen Description BLOOD LEFT HAND  Final   Special Requests   Final    BOTTLES DRAWN AEROBIC AND ANAEROBIC  AER 2CC ANA 4CC   Culture  Setup Time   Final    GRAM POSITIVE COCCI IN CLUSTERS IN BOTH AEROBIC AND ANAEROBIC BOTTLES CRITICAL RESULT CALLED TO, READ BACK BY AND VERIFIED WITH: CRYSTAL SCARPENA 07/23/15 1400 MLM    Culture METHICILLIN RESISTANT STAPHYLOCOCCUS AUREUS  Final   Report Status 07/27/2015 FINAL  Final   Organism ID, Bacteria METHICILLIN RESISTANT STAPHYLOCOCCUS AUREUS  Final      Susceptibility   Methicillin resistant staphylococcus aureus - MIC*    CIPROFLOXACIN >=8 RESISTANT Resistant     ERYTHROMYCIN 0.5 SENSITIVE Sensitive      GENTAMICIN <=0.5 SENSITIVE Sensitive     OXACILLIN >=4 RESISTANT Resistant     VANCOMYCIN 1 SENSITIVE Sensitive     TRIMETH/SULFA <=10 SENSITIVE Sensitive     CLINDAMYCIN <=0.25 SENSITIVE Sensitive     CEFOXITIN SCREEN Value in next row Resistant      POSITIVECEFOXITIN SCREEN - This test may be used to predict mecA-mediated oxacillin resistance, and it is based on the cefoxitin disk screen test.  The cefoxitin screen and oxacillin work in combination to determine the final interpretation reported for oxacillin.  Inducible Clindamycin Value in next row Sensitive      POSITIVECEFOXITIN SCREEN - This test may be used to predict mecA-mediated oxacillin resistance, and it is based on the cefoxitin disk screen test.  The cefoxitin screen and oxacillin work in combination to determine the final interpretation reported for oxacillin.     TETRACYCLINE Value in next row Sensitive      SENSITIVE<=1    * METHICILLIN RESISTANT STAPHYLOCOCCUS AUREUS  Blood Culture ID Panel (Reflexed)     Status: Abnormal   Collection Time: 07/22/15  6:21 PM  Result Value Ref Range Status   Enterococcus species NOT DETECTED NOT DETECTED Final   Listeria monocytogenes NOT DETECTED NOT DETECTED Final   Staphylococcus species DETECTED (A) NOT DETECTED Final   Staphylococcus aureus DETECTED (A) NOT DETECTED Final    Comment: CRITICAL RESULT CALLED TO, READ BACK BY AND VERIFIED WITH: CRYSTAL SCARPENA 07/23/15 1330 ABOUT STAPH SPECIES,STAPH AUREUS, AND MecA DETECTED MLM    Streptococcus species NOT DETECTED NOT DETECTED Final   Streptococcus agalactiae NOT DETECTED NOT DETECTED Final   Streptococcus pneumoniae NOT DETECTED NOT DETECTED Final   Streptococcus pyogenes NOT DETECTED NOT DETECTED Final   Acinetobacter baumannii NOT DETECTED NOT DETECTED Final   Enterobacteriaceae species NOT DETECTED NOT DETECTED Final   Enterobacter cloacae complex NOT DETECTED NOT DETECTED Final   Escherichia coli NOT DETECTED NOT  DETECTED Final   Klebsiella oxytoca NOT DETECTED NOT DETECTED Final   Klebsiella pneumoniae NOT DETECTED NOT DETECTED Final   Proteus species NOT DETECTED NOT DETECTED Final   Serratia marcescens NOT DETECTED NOT DETECTED Final   Haemophilus influenzae NOT DETECTED NOT DETECTED Final   Neisseria meningitidis NOT DETECTED NOT DETECTED Final   Pseudomonas aeruginosa NOT DETECTED NOT DETECTED Final   Candida albicans NOT DETECTED NOT DETECTED Final   Candida glabrata NOT DETECTED NOT DETECTED Final   Candida krusei NOT DETECTED NOT DETECTED Final   Candida parapsilosis NOT DETECTED NOT DETECTED Final   Candida tropicalis NOT DETECTED NOT DETECTED Final   Carbapenem resistance NOT DETECTED NOT DETECTED Final   Methicillin resistance DETECTED (A) NOT DETECTED Final   Vancomycin resistance NOT DETECTED NOT DETECTED Final  MRSA PCR Screening     Status: Abnormal   Collection Time: 07/23/15  5:24 AM  Result Value Ref Range Status   MRSA by PCR POSITIVE (A) NEGATIVE Final    Comment:        The GeneXpert MRSA Assay (FDA approved for NASAL specimens only), is one component of a comprehensive MRSA colonization surveillance program. It is not intended to diagnose MRSA infection nor to guide or monitor treatment for MRSA infections. CRITICAL RESULT CALLED TO, READ BACK BY AND VERIFIED WITH: ALISHA BABB ON 07/23/15 AT 0735 BY QSD   CULTURE, BLOOD (ROUTINE X 2) w Reflex to PCR ID Panel     Status: None   Collection Time: 07/25/15  6:08 AM  Result Value Ref Range Status   Specimen Description BLOOD LEFT AC  Final   Special Requests BOTTLES DRAWN AEROBIC AND ANAEROBIC ANA2ML AER3ML  Final   Culture NO GROWTH 5 DAYS  Final   Report Status 07/30/2015 FINAL  Final  CULTURE, BLOOD (ROUTINE X 2) w Reflex to PCR ID Panel     Status: None   Collection Time: 07/25/15  6:08 AM  Result Value Ref Range Status   Specimen Description BLOOD LEFT WRIST  Final   Special Requests   Final    BOTTLES DRAWN  AEROBIC  AND ANAEROBIC ANA AER   Culture NO GROWTH 5 DAYS  Final   Report Status 07/30/2015 FINAL  Final  Cath Tip Culture     Status: None   Collection Time: 07/28/15  7:32 PM  Result Value Ref Range Status   Specimen Description CATH TIP  Final   Special Requests NONE  Final   Culture NO GROWTH 3 DAYS  Final   Report Status 07/31/2015 FINAL  Final  C difficile quick scan w PCR reflex     Status: Abnormal   Collection Time: 07/31/15 10:28 AM  Result Value Ref Range Status   C Diff antigen POSITIVE (A) NEGATIVE Final   C Diff toxin NEGATIVE NEGATIVE Final   C Diff interpretation   Final    Positive for toxigenic C. difficile, active toxin production not detected. Patient has toxigenic C. difficile organisms present in the bowel, but toxin was not detected. The patient may be a carrier or the level of toxin in the sample was below the limit  of detection. This information should be used in conjunction with the patient's clinical history when deciding on possible therapy.   Clostridium Difficile by PCR     Status: Abnormal   Collection Time: 07/31/15 10:28 AM  Result Value Ref Range Status   Toxigenic C Difficile by pcr POSITIVE (A) NEGATIVE Final    Comment: CRITICAL RESULT CALLED TO, READ BACK BY AND VERIFIED WITH: Hyman Hopes 07/31/15 AT 1211 VKB     Studies/Results:  Dg Chest 1 View  07/28/2015  CLINICAL DATA:  82 year old male with chest pain and shortness of breath EXAM: CHEST 1 VIEW COMPARISON:  Radiograph dated 07/28/2015 FINDINGS: Single-view of the chest demonstrates cardiomegaly. The lungs are hypovolemic. There bilateral lower lobe opacities with no significant interval change compared to prior study. There is no pneumothorax. No acute osseous pathology. IMPRESSION: No significant interval change. Bilateral lower lung field airspace opacities, right greater than left. Electronically Signed   By: Elgie Collard M.D.   On: 07/28/2015 21:12   Dg Chest 1  View  07/28/2015  CLINICAL DATA:  Cough. EXAM: CHEST 1 VIEW COMPARISON:  07/22/2015 FINDINGS: Bilateral lower lobe airspace opacities are noted, increasing on the right since prior study, similar on the left. Small bilateral pleural effusions. Cardiomegaly with vascular congestion. IMPRESSION: Bilateral lower lobe airspace opacities, worsening on the right. Cannot exclude pneumonia. Bilateral effusions. Mild cardiomegaly. Electronically Signed   By: Charlett Nose M.D.   On: 07/28/2015 16:46   Dg Chest 1 View  07/22/2015  CLINICAL DATA:  Pt with end stage renal disease. Pt to ED via EMS from [facility] c/o dialysis catheter having been pulled out approximately 5-6 inches. Pt is verbally unresponsive except to basic commands. EXAM: CHEST  1 VIEW COMPARISON:  06/24/2015 FINDINGS: The tunneled left IJ hemodialysis catheter has its tips in the proximal right atrium. No pneumothorax. Confluent Opacities in the left mid and lower lung probably combination of effusion and adjacent parenchymal consolidation/ atelectasis. Right lung clear. Heart size difficult to assess due to adjacent opacities. No effusion. Atheromatous aorta. IMPRESSION: 1. Left IJ dialysis catheter projects in expected location. 2. Left lower lung consolidation/atelectasis with effusion. Electronically Signed   By: Corlis Leak M.D.   On: 07/22/2015 14:35   Dg Hip Port Unilat With Pelvis 1v Right  07/23/2015  CLINICAL DATA:  Pt is in renal stage failure. Unable to transport downstairs so to be done portable. Best images obtained due to condition.  Previous MR right hip on 11-25. Pt is complaining of pain and is unable to move in any way. EXAM: DG HIP (WITH OR WITHOUT PELVIS) 1V PORT RIGHT COMPARISON:  None. FINDINGS: Hips are located. No evidence of pelvic fracture or sacral fracture. Dedicated view of the RIGHT hip demonstrates no femoral neck fracture. IMPRESSION: No fracture or dislocation. Electronically Signed   By: Genevive Bi  M.D.   On: 07/23/2015 14:45   Study Conclusions  - Left ventricle: There was mild concentric hypertrophy. Systolic function was normal. The estimated ejection fraction was in the range of 55% to 65%. - Aortic valve: Valve area (Vmax): 2.61 cm^2. - Mitral valve: There was mild regurgitation.  Assessment:  Sean Delgado. is a 82 y.o. male with ESRD on HD, admitted with lethargy and found to have MRSA bacteremia likely related to his neck line which had backed out some. He has LLL consolidation/atelectasis as well. Was initially on vanco/zosyn but then zosyn dced. Restarted on zosyn on 1/20 since he had another fever and some cough.  Unable to have permacath placed due to this.  He has seen SS and is being fed by nursing nectar thick liquids.  I think very high aspiration risk C diff + now. He is lethargic and during my exam was coughing and seem to be aspirating his secretions  Recommendations Cont vanco IV for the MRSA bactermia Stopped zosyn for HCAP - received a 7 day course  Although this is all likely aspiration and will likely recur Repeat bcx is neg - when stable  can replace permacath Will need min 2 weeks IV vanco - can be given at HD Cont  oral flagyl for C diff for 10 days after finishes vanco  I would suggest palliative care consult since unlikely to have much recovery and will likely have repeated aspiration events.  Thank you very much for the consult. Will follow with you.  Sean Delgado   08/01/2015, 3:29 PM

## 2015-08-01 NOTE — Progress Notes (Signed)
[facility]  Daily Progress Note   Subjective  - * No surgery date entered *  He is feeling better and is tolerating dialysis via the temp cath.  He remains AF and his cath tip clx is negative  Objective Filed Vitals:   08/01/15 1630 08/01/15 1635 08/01/15 1704 08/01/15 1707  BP: 98/49 113/53 118/49   Pulse: 94 99 130 105  Temp:  97.9 F (36.6 C)    TempSrc:  Oral    Resp: 14 14    Height:      Weight:  90.7 kg (199 lb 15.3 oz)    SpO2:   93%     Intake/Output Summary (Last 24 hours) at 08/01/15 1847 Last data filed at 08/01/15 1635  Gross per 24 hour  Intake    150 ml  Output   1000 ml  Net   -850 ml    PULM  Normal effort , no use of accessory muscles CV  No JVD, RRR Abd      No distended, nontender VASC  Right femoral cath CD&I  Laboratory CBC    Component Value Date/Time   WBC 9.5 08/01/2015 0927   HGB 8.2* 08/01/2015 0927   HCT 28.1* 08/01/2015 0927   PLT 341 08/01/2015 0927    BMET    Component Value Date/Time   NA 138 08/01/2015 0927   K 3.7 08/01/2015 0927   CL 98* 08/01/2015 0927   CO2 30 08/01/2015 0927   GLUCOSE 109* 08/01/2015 0927   BUN 41* 08/01/2015 0927   CREATININE 5.73* 08/01/2015 0927   CALCIUM 8.2* 08/01/2015 0927   GFRNONAA 8* 08/01/2015 0927   GFRAA 10* 08/01/2015 0927    Assessment/Planning:    End stage renal disease      Will plan tunneled catheter tomorrow    Sean Delgado  08/01/2015, 6:47 PM

## 2015-08-01 NOTE — Progress Notes (Signed)
Post hd tx 

## 2015-08-01 NOTE — Progress Notes (Signed)
Hemodialysis completed. 

## 2015-08-01 NOTE — Progress Notes (Signed)
Patient ID: Sean Sumler., male   DOB: 12-Apr-1934, 82 y.o.   MRN: [id number] [facility] PROGRESS NOTE  Sean Delgado. NFA:[id number] DOB: 17-Nov-1933 DOA: 07/22/2015 PCP: Bobbye Morton, MD  HPI/Subjective: Patient seen earlier. He was asking if he was having dialysis. He offers no complaints.  Objective: Filed Vitals:   08/01/15 1630 08/01/15 1635  BP: 98/49 113/53  Pulse: 94 99  Temp:  97.9 F (36.6 C)  Resp: 14 14    Filed Weights   07/31/15 0023 08/01/15 1315 08/01/15 1635  Weight: 91.491 kg (201 lb 11.2 oz) 92.8 kg (204 lb 9.4 oz) 90.7 kg (199 lb 15.3 oz)    ROS: Review of Systems  Constitutional: Negative for fever and chills.  Eyes: Negative for blurred vision.  Respiratory: Positive for cough. Negative for shortness of breath.   Cardiovascular: Negative for chest pain.  Gastrointestinal: Positive for diarrhea. Negative for nausea, vomiting, abdominal pain and constipation.  Genitourinary: Negative for dysuria.  Musculoskeletal: Negative for joint pain.  Neurological: Negative for dizziness and headaches.   Exam: Physical Exam  HENT:  Nose: No mucosal edema.  Mouth/Throat: No oropharyngeal exudate or posterior oropharyngeal edema.  Eyes: Conjunctivae, EOM and lids are normal. Pupils are equal, round, and reactive to light.  Neck: No JVD present. Carotid bruit is not present. No edema present. No thyroid mass and no thyromegaly present.  Cardiovascular: S1 normal and S2 normal.  An irregularly irregular rhythm present. Exam reveals no gallop.   No murmur heard. Pulses:      Dorsalis pedis pulses are 2+ on the right side, and 2+ on the left side.  Respiratory: No respiratory distress. He has no wheezes. He has rhonchi in the right lower field and the left lower field. He has no rales.  GI: Soft. Bowel sounds are normal. There is no tenderness.  Musculoskeletal:       Right ankle: He exhibits swelling.       Left ankle: He exhibits swelling.   Lymphadenopathy:    He has no cervical adenopathy.  Neurological: He is alert.  Skin: Skin is warm. No rash noted. Nails show no clubbing.  Psychiatric: He has a normal mood and affect.      Data Reviewed: Basic Metabolic Panel:  Recent Labs Lab 07/26/15 0537 07/27/15 1247 07/28/15 0456 07/29/15 0418 08/01/15 0927  NA  --   --  143 144 138  K  --   --  2.8* 4.8 3.7  CL  --   --  103 107 98*  CO2  --   --  32 29 30  GLUCOSE  --   --  99 116* 109*  BUN  --   --  11 24* 41*  CREATININE  --   --  2.81* 4.32* 5.73*  CALCIUM  --   --  8.2* 8.3* 8.2*  MG  --   --  2.0  --   --   PHOS 5.9* 4.5  --   --  5.0*   CBC:  Recent Labs Lab 07/28/15 0456 07/29/15 0418 08/01/15 0927  WBC 12.6* 15.3* 9.5  HGB 8.2* 8.3* 8.2*  HCT 27.6* 27.4* 28.1*  MCV 87.2 86.6 86.0  PLT 282 294 341   BNP (last 3 results)  Recent Labs  03/04/15 1212 04/08/15 1621 05/30/15 0844  BNP 560.0* 763.0* 724.0*   CBG:  Recent Labs Lab 07/28/15 2148 07/30/15 1746  GLUCAP 118* 94    Recent Results (from the past  240 hour(s))  Culture, blood (routine x 2)     Status: None   Collection Time: 07/22/15  6:21 PM  Result Value Ref Range Status   Specimen Description BLOOD LEFT HAND  Final   Special Requests BOTTLES DRAWN AEROBIC AND ANAEROBIC  4CC  Final   Culture  Setup Time   Final    GRAM POSITIVE COCCI IN CLUSTERS AEROBIC BOTTLE ONLY CRITICAL RESULT CALLED TO, READ BACK BY AND VERIFIED WITH: CRYSTAL SCARPENA 07/23/15 1330 MLM    Culture   Final    METHICILLIN RESISTANT STAPHYLOCOCCUS AUREUS AEROBIC BOTTLE ONLY    Report Status 07/27/2015 FINAL  Final   Organism ID, Bacteria METHICILLIN RESISTANT STAPHYLOCOCCUS AUREUS  Final      Susceptibility   Methicillin resistant staphylococcus aureus - MIC*    CIPROFLOXACIN >=8 RESISTANT Resistant     ERYTHROMYCIN <=0.25 SENSITIVE Sensitive     GENTAMICIN <=0.5 SENSITIVE Sensitive     OXACILLIN >=4 RESISTANT Resistant     VANCOMYCIN 1  SENSITIVE Sensitive     TRIMETH/SULFA <=10 SENSITIVE Sensitive     CLINDAMYCIN <=0.25 SENSITIVE Sensitive     CEFOXITIN SCREEN Value in next row Resistant      POSITIVECEFOXITIN SCREEN - This test may be used to predict mecA-mediated oxacillin resistance, and it is based on the cefoxitin disk screen test.  The cefoxitin screen and oxacillin work in combination to determine the final interpretation reported for oxacillin.     Inducible Clindamycin Value in next row Sensitive      POSITIVECEFOXITIN SCREEN - This test may be used to predict mecA-mediated oxacillin resistance, and it is based on the cefoxitin disk screen test.  The cefoxitin screen and oxacillin work in combination to determine the final interpretation reported for oxacillin.     TETRACYCLINE Value in next row Sensitive      SENSITIVE<=1    * METHICILLIN RESISTANT STAPHYLOCOCCUS AUREUS  Culture, blood (routine x 2)     Status: None   Collection Time: 07/22/15  6:21 PM  Result Value Ref Range Status   Specimen Description BLOOD LEFT HAND  Final   Special Requests   Final    BOTTLES DRAWN AEROBIC AND ANAEROBIC  AER 2CC ANA 4CC   Culture  Setup Time   Final    GRAM POSITIVE COCCI IN CLUSTERS IN BOTH AEROBIC AND ANAEROBIC BOTTLES CRITICAL RESULT CALLED TO, READ BACK BY AND VERIFIED WITH: CRYSTAL SCARPENA 07/23/15 1400 MLM    Culture METHICILLIN RESISTANT STAPHYLOCOCCUS AUREUS  Final   Report Status 07/27/2015 FINAL  Final   Organism ID, Bacteria METHICILLIN RESISTANT STAPHYLOCOCCUS AUREUS  Final      Susceptibility   Methicillin resistant staphylococcus aureus - MIC*    CIPROFLOXACIN >=8 RESISTANT Resistant     ERYTHROMYCIN 0.5 SENSITIVE Sensitive     GENTAMICIN <=0.5 SENSITIVE Sensitive     OXACILLIN >=4 RESISTANT Resistant     VANCOMYCIN 1 SENSITIVE Sensitive     TRIMETH/SULFA <=10 SENSITIVE Sensitive     CLINDAMYCIN <=0.25 SENSITIVE Sensitive     CEFOXITIN SCREEN Value in next row Resistant      POSITIVECEFOXITIN SCREEN -  This test may be used to predict mecA-mediated oxacillin resistance, and it is based on the cefoxitin disk screen test.  The cefoxitin screen and oxacillin work in combination to determine the final interpretation reported for oxacillin.     Inducible Clindamycin Value in next row Sensitive      POSITIVECEFOXITIN SCREEN - This test may be used  to predict mecA-mediated oxacillin resistance, and it is based on the cefoxitin disk screen test.  The cefoxitin screen and oxacillin work in combination to determine the final interpretation reported for oxacillin.     TETRACYCLINE Value in next row Sensitive      SENSITIVE<=1    * METHICILLIN RESISTANT STAPHYLOCOCCUS AUREUS  Blood Culture ID Panel (Reflexed)     Status: Abnormal   Collection Time: 07/22/15  6:21 PM  Result Value Ref Range Status   Enterococcus species NOT DETECTED NOT DETECTED Final   Listeria monocytogenes NOT DETECTED NOT DETECTED Final   Staphylococcus species DETECTED (A) NOT DETECTED Final   Staphylococcus aureus DETECTED (A) NOT DETECTED Final    Comment: CRITICAL RESULT CALLED TO, READ BACK BY AND VERIFIED WITH: CRYSTAL SCARPENA 07/23/15 1330 ABOUT STAPH SPECIES,STAPH AUREUS, AND MecA DETECTED MLM    Streptococcus species NOT DETECTED NOT DETECTED Final   Streptococcus agalactiae NOT DETECTED NOT DETECTED Final   Streptococcus pneumoniae NOT DETECTED NOT DETECTED Final   Streptococcus pyogenes NOT DETECTED NOT DETECTED Final   Acinetobacter baumannii NOT DETECTED NOT DETECTED Final   Enterobacteriaceae species NOT DETECTED NOT DETECTED Final   Enterobacter cloacae complex NOT DETECTED NOT DETECTED Final   Escherichia coli NOT DETECTED NOT DETECTED Final   Klebsiella oxytoca NOT DETECTED NOT DETECTED Final   Klebsiella pneumoniae NOT DETECTED NOT DETECTED Final   Proteus species NOT DETECTED NOT DETECTED Final   Serratia marcescens NOT DETECTED NOT DETECTED Final   Haemophilus influenzae NOT DETECTED NOT DETECTED Final    Neisseria meningitidis NOT DETECTED NOT DETECTED Final   Pseudomonas aeruginosa NOT DETECTED NOT DETECTED Final   Candida albicans NOT DETECTED NOT DETECTED Final   Candida glabrata NOT DETECTED NOT DETECTED Final   Candida krusei NOT DETECTED NOT DETECTED Final   Candida parapsilosis NOT DETECTED NOT DETECTED Final   Candida tropicalis NOT DETECTED NOT DETECTED Final   Carbapenem resistance NOT DETECTED NOT DETECTED Final   Methicillin resistance DETECTED (A) NOT DETECTED Final   Vancomycin resistance NOT DETECTED NOT DETECTED Final  MRSA PCR Screening     Status: Abnormal   Collection Time: 07/23/15  5:24 AM  Result Value Ref Range Status   MRSA by PCR POSITIVE (A) NEGATIVE Final    Comment:        The GeneXpert MRSA Assay (FDA approved for NASAL specimens only), is one component of a comprehensive MRSA colonization surveillance program. It is not intended to diagnose MRSA infection nor to guide or monitor treatment for MRSA infections. CRITICAL RESULT CALLED TO, READ BACK BY AND VERIFIED WITH: ALISHA BABB ON 07/23/15 AT 0735 BY QSD   CULTURE, BLOOD (ROUTINE X 2) w Reflex to PCR ID Panel     Status: None   Collection Time: 07/25/15  6:08 AM  Result Value Ref Range Status   Specimen Description BLOOD LEFT AC  Final   Special Requests BOTTLES DRAWN AEROBIC AND ANAEROBIC ANA2ML AER3ML  Final   Culture NO GROWTH 5 DAYS  Final   Report Status 07/30/2015 FINAL  Final  CULTURE, BLOOD (ROUTINE X 2) w Reflex to PCR ID Panel     Status: None   Collection Time: 07/25/15  6:08 AM  Result Value Ref Range Status   Specimen Description BLOOD LEFT WRIST  Final   Special Requests   Final    BOTTLES DRAWN AEROBIC AND ANAEROBIC ANA AER   Culture NO GROWTH 5 DAYS  Final   Report Status 07/30/2015 FINAL  Final  Cath Tip Culture     Status: None   Collection Time: 07/28/15  7:32 PM  Result Value Ref Range Status   Specimen Description CATH TIP  Final   Special Requests NONE  Final    Culture NO GROWTH 3 DAYS  Final   Report Status 07/31/2015 FINAL  Final  C difficile quick scan w PCR reflex     Status: Abnormal   Collection Time: 07/31/15 10:28 AM  Result Value Ref Range Status   C Diff antigen POSITIVE (A) NEGATIVE Final   C Diff toxin NEGATIVE NEGATIVE Final   C Diff interpretation   Final    Positive for toxigenic C. difficile, active toxin production not detected. Patient has toxigenic C. difficile organisms present in the bowel, but toxin was not detected. The patient may be a carrier or the level of toxin in the sample was below the limit  of detection. This information should be used in conjunction with the patient's clinical history when deciding on possible therapy.   Clostridium Difficile by PCR     Status: Abnormal   Collection Time: 07/31/15 10:28 AM  Result Value Ref Range Status   Toxigenic C Difficile by pcr POSITIVE (A) NEGATIVE Final    Comment: CRITICAL RESULT CALLED TO, READ BACK BY AND VERIFIED WITH: TAYLOR BECK 07/31/15 AT 1211 VKB      Scheduled Meds: . allopurinol  100 mg Oral Daily  . aspirin EC  81 mg Oral Daily  . atorvastatin  20 mg Oral Daily  . clopidogrel  75 mg Oral Daily  . diltiazem  240 mg Oral Daily  . epoetin (EPOGEN/PROCRIT) injection  10,000 Units Intravenous Q T,Th,Sa-HD  . ferrous sulfate  325 mg Oral Daily  . heparin  5,000 Units Subcutaneous 3 times per day  . latanoprost  1 drop Both Eyes QHS  . metoprolol tartrate  25 mg Oral BID  . metroNIDAZOLE  500 mg Oral 3 times per day  . pregabalin  75 mg Oral Daily  . vancomycin  750 mg Intravenous Q T,Th,Sa-HD   Assessment/Plan:  1. Atrial fibrillation with rapid ventricular response. Cardizem CD 240 mg daily and metoprolol 25 mg twice a day.  Blood pressure borderline low. Patient on aspirin and Plavix for anticoagulation. 2. MRSA sepsis- patient on IV vancomycin 3. Clostridium difficile colitis- oral Flagyl 4. Pneumonia, likely aspiration- completed course of  Zosyn 5. End-stage renal disease on hemodialysis as per nephrology. Vascular surgery to place catheter tomorrow. 6. Anemia of chronic disease on ferrous sulfate, Epogen with dialysis 7. Neuropathy on Lyrica 8. Glaucoma on latanoprost 9. Hyperlipidemia unspecified on atorvastatin 10. History of stroke 59. Palliative care consultation for goals of care.  Code Status:     Code Status Orders        Start     Ordered   07/22/15 2038  Full code   Continuous     07/22/15 2038    Code Status History    Date Active Date Inactive Code Status Order ID Comments User Context   06/24/2015  4:07 PM 06/27/2015 12:37 AM Full Code [id number]  Auburn Bilberry, MD Inpatient   05/30/2015  5:22 PM 06/05/2015  6:42 PM Full Code [id number]  Trudee Kuster, RN Inpatient   05/30/2015  1:59 PM 05/30/2015  5:22 PM DNR [id number]  Shaune Pollack, MD Inpatient   05/29/2015  3:57 PM 05/29/2015  7:32 PM Full Code [id number]  Annice Needy, MD Inpatient   04/08/2015  8:31  PM 04/17/2015 11:42 PM Full Code [id number]  Gracelyn Nurse, MD Inpatient   03/04/2015  4:13 PM 03/11/2015  7:00 PM DNR [id number]  Houston Siren, MD Inpatient     Disposition Plan: To be determined. Spoke with daughter yesterday  Consultants:  Nephrology  Vascular surgery  Palliative care  Procedures:  Left IJ permacath removed with sepsis  Temporary dialysis catheter placed  Antibiotics:  Vancomycin  Oral Flagyl  Time spent: 22 minutes  Alford Highland  [facility]

## 2015-08-01 NOTE — Progress Notes (Signed)
Dr. Wynelle Link aware of pt's BP and HR. Pt scheduled for dialysis today. Instructed to hold metoprolol and cardizem this morning due to BP.

## 2015-08-01 NOTE — Progress Notes (Signed)
Subjective:   Hemodialysis for later today.  C. Diff positive.   Objective:  Vital signs in last 24 hours:  Temp:  [97.8 F (36.6 C)-98.7 F (37.1 C)] 97.8 F (36.6 C) (01/24 0443) Pulse Rate:  [73-104] 83 (01/24 0953) Resp:  [16-18] 18 (01/24 0443) BP: (98-120)/(37-48) 120/48 mmHg (01/24 0953) SpO2:  [87 %-93 %] 93 % (01/24 0443)  Weight change:  Filed Weights   07/27/15 1215 07/28/15 2149 07/31/15 0023  Weight: 92.3 kg (203 lb 7.8 oz) 90.7 kg (199 lb 15.3 oz) 91.491 kg (201 lb 11.2 oz)    Intake/Output:    Intake/Output Summary (Last 24 hours) at 08/01/15 1111 Last data filed at 08/01/15 0453  Gross per 24 hour  Intake      0 ml  Output      0 ml  Net      0 ml     Physical Exam: General:  no acute distress, laying in the bed   HEENT  anicteric, moist oral mucous membranes   Neck  supple   Pulm/lungs  clear to auscultation bilateral, normal effort  CVS/Heart  S1S2 irregular  Abdomen:   soft, nontender, nondistended   Extremities:  no peripheral edema   Neurologic:  alert, oriented, follows commands  Skin:  no acute rashes   Access:  temporary right femoral dialysis catheter 07/28/15. Right AVF maturing with bruit and thrill       Basic Metabolic Panel:   Recent Labs Lab 07/26/15 0537 07/27/15 1247 07/28/15 0456 07/29/15 0418  NA  --   --  143 144  K  --   --  2.8* 4.8  CL  --   --  103 107  CO2  --   --  32 29  GLUCOSE  --   --  99 116*  BUN  --   --  11 24*  CREATININE  --   --  2.81* 4.32*  CALCIUM  --   --  8.2* 8.3*  MG  --   --  2.0  --   PHOS 5.9* 4.5  --   --      CBC:  Recent Labs Lab 07/28/15 0456 07/29/15 0418  WBC 12.6* 15.3*  HGB 8.2* 8.3*  HCT 27.6* 27.4*  MCV 87.2 86.6  PLT 282 294      Microbiology:  Recent Results (from the past 720 hour(s))  Culture, blood (routine x 2)     Status: None   Collection Time: 07/22/15  6:21 PM  Result Value Ref Range Status   Specimen Description BLOOD LEFT HAND  Final    Special Requests BOTTLES DRAWN AEROBIC AND ANAEROBIC  4CC  Final   Culture  Setup Time   Final    GRAM POSITIVE COCCI IN CLUSTERS AEROBIC BOTTLE ONLY CRITICAL RESULT CALLED TO, READ BACK BY AND VERIFIED WITH: CRYSTAL [facility] 07/23/15 1330 MLM    Culture   Final    METHICILLIN RESISTANT STAPHYLOCOCCUS AUREUS AEROBIC BOTTLE ONLY    Report Status 07/27/2015 FINAL  Final   Organism ID, Bacteria METHICILLIN RESISTANT STAPHYLOCOCCUS AUREUS  Final      Susceptibility   Methicillin resistant staphylococcus aureus - MIC*    CIPROFLOXACIN >=8 RESISTANT Resistant     ERYTHROMYCIN <=0.25 SENSITIVE Sensitive     GENTAMICIN <=0.5 SENSITIVE Sensitive     OXACILLIN >=4 RESISTANT Resistant     VANCOMYCIN 1 SENSITIVE Sensitive     TRIMETH/SULFA <=10 SENSITIVE Sensitive     CLINDAMYCIN <=0.25  SENSITIVE Sensitive     CEFOXITIN SCREEN Value in next row Resistant      POSITIVECEFOXITIN SCREEN - This test may be used to predict mecA-mediated oxacillin resistance, and it is based on the cefoxitin disk screen test.  The cefoxitin screen and oxacillin work in combination to determine the final interpretation reported for oxacillin.     Inducible Clindamycin Value in next row Sensitive      POSITIVECEFOXITIN SCREEN - This test may be used to predict mecA-mediated oxacillin resistance, and it is based on the cefoxitin disk screen test.  The cefoxitin screen and oxacillin work in combination to determine the final interpretation reported for oxacillin.     TETRACYCLINE Value in next row Sensitive      SENSITIVE<=1    * METHICILLIN RESISTANT STAPHYLOCOCCUS AUREUS  Culture, blood (routine x 2)     Status: None   Collection Time: 07/22/15  6:21 PM  Result Value Ref Range Status   Specimen Description BLOOD LEFT HAND  Final   Special Requests   Final    BOTTLES DRAWN AEROBIC AND ANAEROBIC  AER 2CC ANA 4CC   Culture  Setup Time   Final    GRAM POSITIVE COCCI IN CLUSTERS IN BOTH AEROBIC AND ANAEROBIC  BOTTLES CRITICAL RESULT CALLED TO, READ BACK BY AND VERIFIED WITH: CRYSTAL SCARPENA 07/23/15 1400 MLM    Culture METHICILLIN RESISTANT STAPHYLOCOCCUS AUREUS  Final   Report Status 07/27/2015 FINAL  Final   Organism ID, Bacteria METHICILLIN RESISTANT STAPHYLOCOCCUS AUREUS  Final      Susceptibility   Methicillin resistant staphylococcus aureus - MIC*    CIPROFLOXACIN >=8 RESISTANT Resistant     ERYTHROMYCIN 0.5 SENSITIVE Sensitive     GENTAMICIN <=0.5 SENSITIVE Sensitive     OXACILLIN >=4 RESISTANT Resistant     VANCOMYCIN 1 SENSITIVE Sensitive     TRIMETH/SULFA <=10 SENSITIVE Sensitive     CLINDAMYCIN <=0.25 SENSITIVE Sensitive     CEFOXITIN SCREEN Value in next row Resistant      POSITIVECEFOXITIN SCREEN - This test may be used to predict mecA-mediated oxacillin resistance, and it is based on the cefoxitin disk screen test.  The cefoxitin screen and oxacillin work in combination to determine the final interpretation reported for oxacillin.     Inducible Clindamycin Value in next row Sensitive      POSITIVECEFOXITIN SCREEN - This test may be used to predict mecA-mediated oxacillin resistance, and it is based on the cefoxitin disk screen test.  The cefoxitin screen and oxacillin work in combination to determine the final interpretation reported for oxacillin.     TETRACYCLINE Value in next row Sensitive      SENSITIVE<=1    * METHICILLIN RESISTANT STAPHYLOCOCCUS AUREUS  Blood Culture ID Panel (Reflexed)     Status: Abnormal   Collection Time: 07/22/15  6:21 PM  Result Value Ref Range Status   Enterococcus species NOT DETECTED NOT DETECTED Final   Listeria monocytogenes NOT DETECTED NOT DETECTED Final   Staphylococcus species DETECTED (A) NOT DETECTED Final   Staphylococcus aureus DETECTED (A) NOT DETECTED Final    Comment: CRITICAL RESULT CALLED TO, READ BACK BY AND VERIFIED WITH: CRYSTAL SCARPENA 07/23/15 1330 ABOUT STAPH SPECIES,STAPH AUREUS, AND MecA DETECTED MLM    Streptococcus  species NOT DETECTED NOT DETECTED Final   Streptococcus agalactiae NOT DETECTED NOT DETECTED Final   Streptococcus pneumoniae NOT DETECTED NOT DETECTED Final   Streptococcus pyogenes NOT DETECTED NOT DETECTED Final   Acinetobacter baumannii NOT DETECTED NOT DETECTED  Final   Enterobacteriaceae species NOT DETECTED NOT DETECTED Final   Enterobacter cloacae complex NOT DETECTED NOT DETECTED Final   Escherichia coli NOT DETECTED NOT DETECTED Final   Klebsiella oxytoca NOT DETECTED NOT DETECTED Final   Klebsiella pneumoniae NOT DETECTED NOT DETECTED Final   Proteus species NOT DETECTED NOT DETECTED Final   Serratia marcescens NOT DETECTED NOT DETECTED Final   Haemophilus influenzae NOT DETECTED NOT DETECTED Final   Neisseria meningitidis NOT DETECTED NOT DETECTED Final   Pseudomonas aeruginosa NOT DETECTED NOT DETECTED Final   Candida albicans NOT DETECTED NOT DETECTED Final   Candida glabrata NOT DETECTED NOT DETECTED Final   Candida krusei NOT DETECTED NOT DETECTED Final   Candida parapsilosis NOT DETECTED NOT DETECTED Final   Candida tropicalis NOT DETECTED NOT DETECTED Final   Carbapenem resistance NOT DETECTED NOT DETECTED Final   Methicillin resistance DETECTED (A) NOT DETECTED Final   Vancomycin resistance NOT DETECTED NOT DETECTED Final  MRSA PCR Screening     Status: Abnormal   Collection Time: 07/23/15  5:24 AM  Result Value Ref Range Status   MRSA by PCR POSITIVE (A) NEGATIVE Final    Comment:        The GeneXpert MRSA Assay (FDA approved for NASAL specimens only), is one component of a comprehensive MRSA colonization surveillance program. It is not intended to diagnose MRSA infection nor to guide or monitor treatment for MRSA infections. CRITICAL RESULT CALLED TO, READ BACK BY AND VERIFIED WITH: ALISHA BABB ON 07/23/15 AT 0735 BY QSD   CULTURE, BLOOD (ROUTINE X 2) w Reflex to PCR ID Panel     Status: None   Collection Time: 07/25/15  6:08 AM  Result Value Ref Range  Status   Specimen Description BLOOD LEFT AC  Final   Special Requests BOTTLES DRAWN AEROBIC AND ANAEROBIC ANA2ML AER3ML  Final   Culture NO GROWTH 5 DAYS  Final   Report Status 07/30/2015 FINAL  Final  CULTURE, BLOOD (ROUTINE X 2) w Reflex to PCR ID Panel     Status: None   Collection Time: 07/25/15  6:08 AM  Result Value Ref Range Status   Specimen Description BLOOD LEFT WRIST  Final   Special Requests   Final    BOTTLES DRAWN AEROBIC AND ANAEROBIC ANA AER   Culture NO GROWTH 5 DAYS  Final   Report Status 07/30/2015 FINAL  Final  Cath Tip Culture     Status: None   Collection Time: 07/28/15  7:32 PM  Result Value Ref Range Status   Specimen Description CATH TIP  Final   Special Requests NONE  Final   Culture NO GROWTH 3 DAYS  Final   Report Status 07/31/2015 FINAL  Final  C difficile quick scan w PCR reflex     Status: Abnormal   Collection Time: 07/31/15 10:28 AM  Result Value Ref Range Status   C Diff antigen POSITIVE (A) NEGATIVE Final   C Diff toxin NEGATIVE NEGATIVE Final   C Diff interpretation   Final    Positive for toxigenic C. difficile, active toxin production not detected. Patient has toxigenic C. difficile organisms present in the bowel, but toxin was not detected. The patient may be a carrier or the level of toxin in the sample was below the limit  of detection. This information should be used in conjunction with the patient's clinical history when deciding on possible therapy.   Clostridium Difficile by PCR     Status: Abnormal  Collection Time: 07/31/15 10:28 AM  Result Value Ref Range Status   Toxigenic C Difficile by pcr POSITIVE (A) NEGATIVE Final    Comment: CRITICAL RESULT CALLED TO, READ BACK BY AND VERIFIED WITH: TAYLOR BECK 07/31/15 AT 1211 VKB     Coagulation Studies: No results for input(s): LABPROT, INR in the last 72 hours.  Urinalysis: No results for input(s): COLORURINE, LABSPEC, PHURINE, GLUCOSEU, HGBUR, BILIRUBINUR, KETONESUR,  PROTEINUR, UROBILINOGEN, NITRITE, LEUKOCYTESUR in the last 72 hours.  Invalid input(s): APPERANCEUR    Imaging: No results found.   Medications:     . allopurinol  100 mg Oral Daily  . aspirin EC  81 mg Oral Daily  . atorvastatin  20 mg Oral Daily  . clopidogrel  75 mg Oral Daily  . diltiazem  240 mg Oral Daily  . epoetin (EPOGEN/PROCRIT) injection  10,000 Units Intravenous Q T,Th,Sa-HD  . ferrous sulfate  325 mg Oral Daily  . heparin  5,000 Units Subcutaneous 3 times per day  . latanoprost  1 drop Both Eyes QHS  . metoprolol tartrate  25 mg Oral BID  . metroNIDAZOLE  500 mg Oral 3 times per day  . pregabalin  75 mg Oral Daily  . vancomycin  750 mg Intravenous Q T,Th,Sa-HD   sodium chloride, sodium chloride, benzonatate, heparin, lidocaine (PF), lidocaine-prilocaine, naLOXone (NARCAN)  injection, oxyCODONE-acetaminophen, pentafluoroprop-tetrafluoroeth  Assessment/ Plan:  82 y.o. black male  with right eye blindness, diastolic congestive heart failure, hypertension, gout, glaucoma, peripheral neuropathy, atrial fibrillation, anemia, carotid stenosis status post bilateral CEA, CVA, peripheral vascular disease   [facility]/ TTS  1. End Stage Renal Disease: next hemodialysis for later today. Orders prepared. Continue TTS schedule.  Complication of hemodialysis access: tunneled catheter has been dislodged. Now temp femoarl line placed. Will need tunneled catheter before discharge. Discussed case with Dr. Gilda Crease, tentatively scheduled for catheter placement tomorrow.  - MRSA sepsis. Cath tip culture and blood cultures with no growth from 07/24/14. Currently on vanco.  2. Anemia of chronic kidney disease: Hemoglobin down to 8.3. - continue epogen 10000 units IV with HD.   3. Secondary Hyperparathyroidism: not currently on a binder or vitamin D agent.  - most recent calcium and phosphorus at goal.   4.  Hypertension: hypotensive due to sepsis.  -  metoprolol and diltiazem. Now being held for dialysis later today.    LOS: 10 Tkai Large 1/24/201711:11 AM

## 2015-08-02 ENCOUNTER — Encounter
Admission: EM | Disposition: A | Discharge status: Skilled Nursing Facility | Payer: Self-pay | Source: Home / Self Care | Attending: Internal Medicine

## 2015-08-02 HISTORY — PX: PERIPHERAL VASCULAR CATHETERIZATION: SHX172C

## 2015-08-02 LAB — PHOSPHORUS: PHOSPHORUS: 4.9 mg/dL — AB (ref 2.5–4.6)

## 2015-08-02 SURGERY — DIALYSIS/PERMA CATHETER INSERTION
Anesthesia: Moderate Sedation

## 2015-08-02 MED ORDER — HEPARIN SODIUM (PORCINE) 10000 UNIT/ML IJ SOLN
INTRAMUSCULAR | Status: AC
  Filled 2015-08-02: qty 1

## 2015-08-02 MED ORDER — HEPARIN (PORCINE) IN NACL 2-0.9 UNIT/ML-% IJ SOLN
INTRAMUSCULAR | Status: AC
  Filled 2015-08-02: qty 500

## 2015-08-02 MED ORDER — FENTANYL CITRATE (PF) 100 MCG/2ML IJ SOLN
INTRAMUSCULAR | Status: AC
  Filled 2015-08-02: qty 2

## 2015-08-02 MED ORDER — LIDOCAINE-EPINEPHRINE (PF) 1 %-1:200000 IJ SOLN
INTRAMUSCULAR | Status: AC
  Filled 2015-08-02: qty 30

## 2015-08-02 MED ORDER — MIDAZOLAM HCL 2 MG/2ML IJ SOLN
INTRAMUSCULAR | Status: DC | PRN
  Administered 2015-08-02: 2 mg via INTRAVENOUS

## 2015-08-02 MED ORDER — MUPIROCIN 2 % EX OINT
1.0000 | TOPICAL_OINTMENT | Freq: Two times a day (BID) | CUTANEOUS | Status: DC
Start: 2015-08-02 — End: 2015-08-04
  Administered 2015-08-02 – 2015-08-04 (×5): 1 via NASAL
  Filled 2015-08-02: qty 22

## 2015-08-02 MED ORDER — FENTANYL CITRATE (PF) 100 MCG/2ML IJ SOLN
INTRAMUSCULAR | Status: DC | PRN
  Administered 2015-08-02: 50 ug via INTRAVENOUS

## 2015-08-02 MED ORDER — ACETAMINOPHEN 325 MG PO TABS
650.0000 mg | ORAL_TABLET | Freq: Four times a day (QID) | ORAL | Status: DC | PRN
  Administered 2015-08-02 – 2015-08-03 (×2): 650 mg via ORAL
  Filled 2015-08-02: qty 2

## 2015-08-02 MED ORDER — CHLORHEXIDINE GLUCONATE CLOTH 2 % EX PADS
6.0000 | MEDICATED_PAD | Freq: Every day | CUTANEOUS | Status: DC
Start: 2015-08-02 — End: 2015-08-04
  Administered 2015-08-02 – 2015-08-04 (×3): 6 via TOPICAL

## 2015-08-02 MED ORDER — MIDAZOLAM HCL 5 MG/5ML IJ SOLN
INTRAMUSCULAR | Status: AC
  Filled 2015-08-02: qty 5

## 2015-08-02 MED ORDER — LIDOCAINE-EPINEPHRINE (PF) 1 %-1:200000 IJ SOLN
INTRAMUSCULAR | Status: DC | PRN
  Administered 2015-08-02: 20 mL via INTRADERMAL

## 2015-08-02 SURGICAL SUPPLY — 8 items
CATH PALINDROME RT-P 15FX19CM (CATHETERS) ×3 IMPLANT
DERMABOND ADVANCED (GAUZE/BANDAGES/DRESSINGS) ×2
DERMABOND ADVANCED .7 DNX12 (GAUZE/BANDAGES/DRESSINGS) ×1 IMPLANT
DRAPE INCISE IOBAN 66X45 STRL (DRAPES) ×3 IMPLANT
PACK ANGIOGRAPHY (CUSTOM PROCEDURE TRAY) ×3 IMPLANT
SUT MNCRL AB 4-0 PS2 18 (SUTURE) ×3 IMPLANT
SUT SILK 0 FSL (SUTURE) ×3 IMPLANT
TOWEL OR 17X26 4PK STRL BLUE (TOWEL DISPOSABLE) ×3 IMPLANT

## 2015-08-02 NOTE — Progress Notes (Signed)
Sean Delgado Date of Admission:  07/22/2015     ID: Sean Delgado. is a 82 y.o. male with  MRSA bacteremia, HD patient  Active Problems:   Acute respiratory failure (HCC)   Sepsis (HCC)   Aspiration pneumonia (HCC)   Malnutrition of moderate degree   Subjective: No fevers no complaints. Denies diarrhea  ROS  Eleven systems are reviewed and negative except per hpi  Medications:  Antibiotics Given (last 72 hours)    Date/Time Action Medication Dose Rate   07/30/15 2241 Given   piperacillin-tazobactam (ZOSYN) IVPB 3.375 g 3.375 g 12.5 mL/hr   07/31/15 1138 Given   piperacillin-tazobactam (ZOSYN) IVPB 3.375 g 3.375 g 12.5 mL/hr   07/31/15 1359 Given   vancomycin (VANCOCIN) 50 mg/mL oral solution 250 mg 250 mg    07/31/15 1638 Given   metroNIDAZOLE (FLAGYL) tablet 500 mg 500 mg    07/31/15 2251 Given   metroNIDAZOLE (FLAGYL) tablet 500 mg 500 mg    08/01/15 0500 Given   metroNIDAZOLE (FLAGYL) tablet 500 mg 500 mg    08/01/15 1310 Given   metroNIDAZOLE (FLAGYL) tablet 500 mg 500 mg    08/01/15 1527 Given  [Given last hour of hemodialysis treatment.]   vancomycin (VANCOCIN) IVPB 750 mg/150 ml premix 750 mg 150 mL/hr   08/01/15 2203 Given   metroNIDAZOLE (FLAGYL) tablet 500 mg 500 mg    08/02/15 0640 Given   metroNIDAZOLE (FLAGYL) tablet 500 mg 500 mg      . allopurinol  100 mg Oral Daily  . aspirin EC  81 mg Oral Daily  . atorvastatin  20 mg Oral Daily  .  ceFAZolin (ANCEF) IV  1 g Intravenous On Call  . Chlorhexidine Gluconate Cloth  6 each Topical Q0600  . clopidogrel  75 mg Oral Daily  . diltiazem  240 mg Oral Daily  . epoetin (EPOGEN/PROCRIT) injection  10,000 Units Intravenous Q T,Th,Sa-HD  . ferrous sulfate  325 mg Oral Daily  . heparin  5,000 Units Subcutaneous 3 times per day  . latanoprost  1 drop Both Eyes QHS  . metoprolol tartrate  25 mg Oral BID  . metroNIDAZOLE  500 mg Oral 3 times per day  . mupirocin ointment  1  application Nasal BID  . pregabalin  75 mg Oral Daily  . vancomycin  750 mg Intravenous Q T,Th,Sa-HD    Objective: Vital signs in last 24 hours: Temp:  [97.6 F (36.4 C)-99.2 F (37.3 C)] 99.2 F (37.3 C) (01/25 1312) Pulse Rate:  [27-130] 115 (01/25 1312) Resp:  [14-20] 20 (01/25 1312) BP: (93-118)/(40-63) 101/55 mmHg (01/25 1312) SpO2:  [90 %-98 %] 91 % (01/25 1312) Weight:  [90.7 kg (199 lb 15.3 oz)] 90.7 kg (199 lb 15.3 oz) (01/24 1635) Constitutional: He is oriented to person, place, and time. Appears chronically ill HENT: anicteric Mouth/Throat: Oropharynx is clear and dry. No oropharyngeal exudate.  Cardiovascular: Normal rate, regular rhythm and normal heart sounds. Distant HD Pulmonary/Chest: Effort normal and breath sounds normal. No respiratory distress. He has no wheezes.  Abdominal: Soft. Bowel sounds are normal. He exhibits no distension. There is no tenderness.  Lymphadenopathy: He has no cervical adenopathy.  Neurological: He is alert and oriented to person, place, and time.  Skin: dry scaly skin  Psychiatric: He has a normal mood and affect. His behavior is normal.  L chest wall HD cath site covered- nontender R groin temp cath in place  Lab Results  Recent Labs  08/01/15 0927  WBC 9.5  HGB 8.2*  HCT 28.1*  NA 138  K 3.7  CL 98*  CO2 30  BUN 41*  CREATININE 5.73*    Microbiology: Results for orders placed or performed during the hospital encounter of 07/22/15  Culture, blood (routine x 2)     Status: None   Collection Time: 07/22/15  6:21 PM  Result Value Ref Range Status   Specimen Description BLOOD LEFT HAND  Final   Special Requests BOTTLES DRAWN AEROBIC AND ANAEROBIC  4CC  Final   Culture  Setup Time   Final    GRAM POSITIVE COCCI IN CLUSTERS AEROBIC BOTTLE ONLY CRITICAL RESULT CALLED TO, READ BACK BY AND VERIFIED WITH: CRYSTAL SCARPENA 07/23/15 1330 MLM    Culture   Final    METHICILLIN RESISTANT STAPHYLOCOCCUS AUREUS AEROBIC BOTTLE  ONLY    Report Status 07/27/2015 FINAL  Final   Organism ID, Bacteria METHICILLIN RESISTANT STAPHYLOCOCCUS AUREUS  Final      Susceptibility   Methicillin resistant staphylococcus aureus - MIC*    CIPROFLOXACIN >=8 RESISTANT Resistant     ERYTHROMYCIN <=0.25 SENSITIVE Sensitive     GENTAMICIN <=0.5 SENSITIVE Sensitive     OXACILLIN >=4 RESISTANT Resistant     VANCOMYCIN 1 SENSITIVE Sensitive     TRIMETH/SULFA <=10 SENSITIVE Sensitive     CLINDAMYCIN <=0.25 SENSITIVE Sensitive     CEFOXITIN SCREEN Value in next row Resistant      POSITIVECEFOXITIN SCREEN - This test may be used to predict mecA-mediated oxacillin resistance, and it is based on the cefoxitin disk screen test.  The cefoxitin screen and oxacillin work in combination to determine the final interpretation reported for oxacillin.     Inducible Clindamycin Value in next row Sensitive      POSITIVECEFOXITIN SCREEN - This test may be used to predict mecA-mediated oxacillin resistance, and it is based on the cefoxitin disk screen test.  The cefoxitin screen and oxacillin work in combination to determine the final interpretation reported for oxacillin.     TETRACYCLINE Value in next row Sensitive      SENSITIVE<=1    * METHICILLIN RESISTANT STAPHYLOCOCCUS AUREUS  Culture, blood (routine x 2)     Status: None   Collection Time: 07/22/15  6:21 PM  Result Value Ref Range Status   Specimen Description BLOOD LEFT HAND  Final   Special Requests   Final    BOTTLES DRAWN AEROBIC AND ANAEROBIC  AER 2CC ANA 4CC   Culture  Setup Time   Final    GRAM POSITIVE COCCI IN CLUSTERS IN BOTH AEROBIC AND ANAEROBIC BOTTLES CRITICAL RESULT CALLED TO, READ BACK BY AND VERIFIED WITH: CRYSTAL SCARPENA 07/23/15 1400 MLM    Culture METHICILLIN RESISTANT STAPHYLOCOCCUS AUREUS  Final   Report Status 07/27/2015 FINAL  Final   Organism ID, Bacteria METHICILLIN RESISTANT STAPHYLOCOCCUS AUREUS  Final      Susceptibility   Methicillin resistant staphylococcus  aureus - MIC*    CIPROFLOXACIN >=8 RESISTANT Resistant     ERYTHROMYCIN 0.5 SENSITIVE Sensitive     GENTAMICIN <=0.5 SENSITIVE Sensitive     OXACILLIN >=4 RESISTANT Resistant     VANCOMYCIN 1 SENSITIVE Sensitive     TRIMETH/SULFA <=10 SENSITIVE Sensitive     CLINDAMYCIN <=0.25 SENSITIVE Sensitive     CEFOXITIN SCREEN Value in next row Resistant      POSITIVECEFOXITIN SCREEN - This test may be used to predict mecA-mediated oxacillin resistance, and it is based on the cefoxitin disk  screen test.  The cefoxitin screen and oxacillin work in combination to determine the final interpretation reported for oxacillin.     Inducible Clindamycin Value in next row Sensitive      POSITIVECEFOXITIN SCREEN - This test may be used to predict mecA-mediated oxacillin resistance, and it is based on the cefoxitin disk screen test.  The cefoxitin screen and oxacillin work in combination to determine the final interpretation reported for oxacillin.     TETRACYCLINE Value in next row Sensitive      SENSITIVE<=1    * METHICILLIN RESISTANT STAPHYLOCOCCUS AUREUS  Blood Culture ID Panel (Reflexed)     Status: Abnormal   Collection Time: 07/22/15  6:21 PM  Result Value Ref Range Status   Enterococcus species NOT DETECTED NOT DETECTED Final   Listeria monocytogenes NOT DETECTED NOT DETECTED Final   Staphylococcus species DETECTED (A) NOT DETECTED Final   Staphylococcus aureus DETECTED (A) NOT DETECTED Final    Comment: CRITICAL RESULT CALLED TO, READ BACK BY AND VERIFIED WITH: CRYSTAL SCARPENA 07/23/15 1330 ABOUT STAPH SPECIES,STAPH AUREUS, AND MecA DETECTED MLM    Streptococcus species NOT DETECTED NOT DETECTED Final   Streptococcus agalactiae NOT DETECTED NOT DETECTED Final   Streptococcus pneumoniae NOT DETECTED NOT DETECTED Final   Streptococcus pyogenes NOT DETECTED NOT DETECTED Final   Acinetobacter baumannii NOT DETECTED NOT DETECTED Final   Enterobacteriaceae species NOT DETECTED NOT DETECTED Final    Enterobacter cloacae complex NOT DETECTED NOT DETECTED Final   Escherichia coli NOT DETECTED NOT DETECTED Final   Klebsiella oxytoca NOT DETECTED NOT DETECTED Final   Klebsiella pneumoniae NOT DETECTED NOT DETECTED Final   Proteus species NOT DETECTED NOT DETECTED Final   Serratia marcescens NOT DETECTED NOT DETECTED Final   Haemophilus influenzae NOT DETECTED NOT DETECTED Final   Neisseria meningitidis NOT DETECTED NOT DETECTED Final   Pseudomonas aeruginosa NOT DETECTED NOT DETECTED Final   Candida albicans NOT DETECTED NOT DETECTED Final   Candida glabrata NOT DETECTED NOT DETECTED Final   Candida krusei NOT DETECTED NOT DETECTED Final   Candida parapsilosis NOT DETECTED NOT DETECTED Final   Candida tropicalis NOT DETECTED NOT DETECTED Final   Carbapenem resistance NOT DETECTED NOT DETECTED Final   Methicillin resistance DETECTED (A) NOT DETECTED Final   Vancomycin resistance NOT DETECTED NOT DETECTED Final  MRSA PCR Screening     Status: Abnormal   Collection Time: 07/23/15  5:24 AM  Result Value Ref Range Status   MRSA by PCR POSITIVE (A) NEGATIVE Final    Comment:        The GeneXpert MRSA Assay (FDA approved for NASAL specimens only), is one component of a comprehensive MRSA colonization surveillance program. It is not intended to diagnose MRSA infection nor to guide or monitor treatment for MRSA infections. CRITICAL RESULT CALLED TO, READ BACK BY AND VERIFIED WITH: ALISHA BABB ON 07/23/15 AT 0735 BY QSD   CULTURE, BLOOD (ROUTINE X 2) w Reflex to PCR ID Panel     Status: None   Collection Time: 07/25/15  6:08 AM  Result Value Ref Range Status   Specimen Description BLOOD LEFT AC  Final   Special Requests BOTTLES DRAWN AEROBIC AND ANAEROBIC ANA2ML AER3ML  Final   Culture NO GROWTH 5 DAYS  Final   Report Status 07/30/2015 FINAL  Final  CULTURE, BLOOD (ROUTINE X 2) w Reflex to PCR ID Panel     Status: None   Collection Time: 07/25/15  6:08 AM  Result Value Ref Range  Status   Specimen Description BLOOD LEFT WRIST  Final   Special Requests   Final    BOTTLES DRAWN AEROBIC AND ANAEROBIC ANA AER   Culture NO GROWTH 5 DAYS  Final   Report Status 07/30/2015 FINAL  Final  Cath Tip Culture     Status: None   Collection Time: 07/28/15  7:32 PM  Result Value Ref Range Status   Specimen Description CATH TIP  Final   Special Requests NONE  Final   Culture NO GROWTH 3 DAYS  Final   Report Status 07/31/2015 FINAL  Final  C difficile quick scan w PCR reflex     Status: Abnormal   Collection Time: 07/31/15 10:28 AM  Result Value Ref Range Status   C Diff antigen POSITIVE (A) NEGATIVE Final   C Diff toxin NEGATIVE NEGATIVE Final   C Diff interpretation   Final    Positive for toxigenic C. difficile, active toxin production not detected. Patient has toxigenic C. difficile organisms present in the bowel, but toxin was not detected. The patient may be a carrier or the level of toxin in the sample was below the limit  of detection. This information should be used in conjunction with the patient's clinical history when deciding on possible therapy.   Clostridium Difficile by PCR     Status: Abnormal   Collection Time: 07/31/15 10:28 AM  Result Value Ref Range Status   Toxigenic C Difficile by pcr POSITIVE (A) NEGATIVE Final    Comment: CRITICAL RESULT CALLED TO, READ BACK BY AND VERIFIED WITH: Hyman Hopes 07/31/15 AT 1211 VKB     Studies/Results:  Dg Chest 1 View  07/28/2015  CLINICAL DATA:  82 year old male with chest pain and shortness of breath EXAM: CHEST 1 VIEW COMPARISON:  Radiograph dated 07/28/2015 FINDINGS: Single-view of the chest demonstrates cardiomegaly. The lungs are hypovolemic. There bilateral lower lobe opacities with no significant interval change compared to prior study. There is no pneumothorax. No acute osseous pathology. IMPRESSION: No significant interval change. Bilateral lower lung field airspace opacities, right greater than left.  Electronically Signed   By: Elgie Collard M.D.   On: 07/28/2015 21:12   Dg Chest 1 View  07/28/2015  CLINICAL DATA:  Cough. EXAM: CHEST 1 VIEW COMPARISON:  07/22/2015 FINDINGS: Bilateral lower lobe airspace opacities are noted, increasing on the right since prior study, similar on the left. Small bilateral pleural effusions. Cardiomegaly with vascular congestion. IMPRESSION: Bilateral lower lobe airspace opacities, worsening on the right. Cannot exclude pneumonia. Bilateral effusions. Mild cardiomegaly. Electronically Signed   By: Charlett Nose M.D.   On: 07/28/2015 16:46   Dg Chest 1 View  07/22/2015  CLINICAL DATA:  Pt with end stage renal disease. Pt to ED via EMS from [facility] c/o dialysis catheter having been pulled out approximately 5-6 inches. Pt is verbally unresponsive except to basic commands. EXAM: CHEST  1 VIEW COMPARISON:  06/24/2015 FINDINGS: The tunneled left IJ hemodialysis catheter has its tips in the proximal right atrium. No pneumothorax. Confluent Opacities in the left mid and lower lung probably combination of effusion and adjacent parenchymal consolidation/ atelectasis. Right lung clear. Heart size difficult to assess due to adjacent opacities. No effusion. Atheromatous aorta. IMPRESSION: 1. Left IJ dialysis catheter projects in expected location. 2. Left lower lung consolidation/atelectasis with effusion. Electronically Signed   By: Corlis Leak M.D.   On: 07/22/2015 14:35   Dg Hip Port Unilat With Pelvis 1v Right  07/23/2015  CLINICAL  DATA:  Pt is in renal stage failure. Unable to transport downstairs so to be done portable. Best images obtained due to condition. Previous MR right hip on 11-25. Pt is complaining of pain and is unable to move in any way. EXAM: DG HIP (WITH OR WITHOUT PELVIS) 1V PORT RIGHT COMPARISON:  None. FINDINGS: Hips are located. No evidence of pelvic fracture or sacral fracture. Dedicated view of the RIGHT hip demonstrates no femoral neck  fracture. IMPRESSION: No fracture or dislocation. Electronically Signed   By: Genevive Bi M.D.   On: 07/23/2015 14:45   Study Conclusions  - Left ventricle: There was mild concentric hypertrophy. Systolic function was normal. The estimated ejection fraction was in the range of 55% to 65%. - Aortic valve: Valve area (Vmax): 2.61 cm^2. - Mitral valve: There was mild regurgitation.  Assessment:  Sean Delgado. is a 82 y.o. male with ESRD on HD, admitted with lethargy and found to have MRSA bacteremia likely related to his neck line which had backed out some. He has LLL consolidation/atelectasis as well. Was initially on vanco/zosyn but then zosyn dced. Restarted on zosyn on 1/20 since he had another fever and some cough.  Unable to have permacath placed due to this.  He has seen SS and is being fed by nursing nectar thick liquids.  I think very high aspiration risk C diff + now. He is lethargic and during my exam was coughing and seem to be aspirating his secretions  Recommendations Cont vanco IV for the MRSA bactermia - Will need min 2 weeks IV vanco - can be given at HD Stop date 08/09/15 For placement of permacath today  C diff-  - Cont  oral flagyl for C diff for 10 days after finishes vanco  Stop date 08/19/15  Thank you very much for the consult. I will sign off but please call with questions  Ugochukwu Chichester   08/02/2015, 3:32 PM

## 2015-08-02 NOTE — Progress Notes (Signed)
Subjective:   Hemodialysis yesterday. Tolerated treatment well.  Permcath for today or tomorrow.   Objective:  Vital signs in last 24 hours:  Temp:  [97.6 F (36.4 C)-99.2 F (37.3 C)] 99.2 F (37.3 C) (01/25 1312) Pulse Rate:  [27-130] 115 (01/25 1312) Resp:  [14-20] 20 (01/25 1312) BP: (93-118)/(40-63) 101/55 mmHg (01/25 1312) SpO2:  [90 %-98 %] 91 % (01/25 1312) Weight:  [90.7 kg (199 lb 15.3 oz)] 90.7 kg (199 lb 15.3 oz) (01/24 1635)  Weight change:  Filed Weights   07/31/15 0023 08/01/15 1315 08/01/15 1635  Weight: 91.491 kg (201 lb 11.2 oz) 92.8 kg (204 lb 9.4 oz) 90.7 kg (199 lb 15.3 oz)    Intake/Output:    Intake/Output Summary (Last 24 hours) at 08/02/15 1539 Last data filed at 08/02/15 0306  Gross per 24 hour  Intake      0 ml  Output   1000 ml  Net  -1000 ml     Physical Exam: General:  no acute distress, laying in the bed   HEENT  anicteric, moist oral mucous membranes   Neck  supple   Pulm/lungs  clear to auscultation bilateral, normal effort  CVS/Heart  S1S2 irregular  Abdomen:   soft, nontender, nondistended   Extremities:  no peripheral edema   Neurologic:  alert, oriented, follows commands  Skin:  no acute rashes   Access:  temporary right femoral dialysis catheter 07/28/15. Right AVF maturing with bruit and thrill       Basic Metabolic Panel:   Recent Labs Lab 07/27/15 1247 07/28/15 0456 07/29/15 0418 08/01/15 0927  NA  --  143 144 138  K  --  2.8* 4.8 3.7  CL  --  103 107 98*  CO2  --  32 29 30  GLUCOSE  --  99 116* 109*  BUN  --  11 24* 41*  CREATININE  --  2.81* 4.32* 5.73*  CALCIUM  --  8.2* 8.3* 8.2*  MG  --  2.0  --   --   PHOS 4.5  --   --  5.0*     CBC:  Recent Labs Lab 07/28/15 0456 07/29/15 0418 08/01/15 0927  WBC 12.6* 15.3* 9.5  HGB 8.2* 8.3* 8.2*  HCT 27.6* 27.4* 28.1*  MCV 87.2 86.6 86.0  PLT 282 294 341      Microbiology:  Recent Results (from the past 720 hour(s))  Culture, blood (routine x  2)     Status: None   Collection Time: 07/22/15  6:21 PM  Result Value Ref Range Status   Specimen Description BLOOD LEFT HAND  Final   Special Requests BOTTLES DRAWN AEROBIC AND ANAEROBIC  4CC  Final   Culture  Setup Time   Final    GRAM POSITIVE COCCI IN CLUSTERS AEROBIC BOTTLE ONLY CRITICAL RESULT CALLED TO, READ BACK BY AND VERIFIED WITH: CRYSTAL [facility] 07/23/15 1330 MLM    Culture   Final    METHICILLIN RESISTANT STAPHYLOCOCCUS AUREUS AEROBIC BOTTLE ONLY    Report Status 07/27/2015 FINAL  Final   Organism ID, Bacteria METHICILLIN RESISTANT STAPHYLOCOCCUS AUREUS  Final      Susceptibility   Methicillin resistant staphylococcus aureus - MIC*    CIPROFLOXACIN >=8 RESISTANT Resistant     ERYTHROMYCIN <=0.25 SENSITIVE Sensitive     GENTAMICIN <=0.5 SENSITIVE Sensitive     OXACILLIN >=4 RESISTANT Resistant     VANCOMYCIN 1 SENSITIVE Sensitive     TRIMETH/SULFA <=10 SENSITIVE Sensitive  CLINDAMYCIN <=0.25 SENSITIVE Sensitive     CEFOXITIN SCREEN Value in next row Resistant      POSITIVECEFOXITIN SCREEN - This test may be used to predict mecA-mediated oxacillin resistance, and it is based on the cefoxitin disk screen test.  The cefoxitin screen and oxacillin work in combination to determine the final interpretation reported for oxacillin.     Inducible Clindamycin Value in next row Sensitive      POSITIVECEFOXITIN SCREEN - This test may be used to predict mecA-mediated oxacillin resistance, and it is based on the cefoxitin disk screen test.  The cefoxitin screen and oxacillin work in combination to determine the final interpretation reported for oxacillin.     TETRACYCLINE Value in next row Sensitive      SENSITIVE<=1    * METHICILLIN RESISTANT STAPHYLOCOCCUS AUREUS  Culture, blood (routine x 2)     Status: None   Collection Time: 07/22/15  6:21 PM  Result Value Ref Range Status   Specimen Description BLOOD LEFT HAND  Final   Special Requests   Final    BOTTLES DRAWN AEROBIC AND  ANAEROBIC  AER 2CC ANA 4CC   Culture  Setup Time   Final    GRAM POSITIVE COCCI IN CLUSTERS IN BOTH AEROBIC AND ANAEROBIC BOTTLES CRITICAL RESULT CALLED TO, READ BACK BY AND VERIFIED WITH: CRYSTAL SCARPENA 07/23/15 1400 MLM    Culture METHICILLIN RESISTANT STAPHYLOCOCCUS AUREUS  Final   Report Status 07/27/2015 FINAL  Final   Organism ID, Bacteria METHICILLIN RESISTANT STAPHYLOCOCCUS AUREUS  Final      Susceptibility   Methicillin resistant staphylococcus aureus - MIC*    CIPROFLOXACIN >=8 RESISTANT Resistant     ERYTHROMYCIN 0.5 SENSITIVE Sensitive     GENTAMICIN <=0.5 SENSITIVE Sensitive     OXACILLIN >=4 RESISTANT Resistant     VANCOMYCIN 1 SENSITIVE Sensitive     TRIMETH/SULFA <=10 SENSITIVE Sensitive     CLINDAMYCIN <=0.25 SENSITIVE Sensitive     CEFOXITIN SCREEN Value in next row Resistant      POSITIVECEFOXITIN SCREEN - This test may be used to predict mecA-mediated oxacillin resistance, and it is based on the cefoxitin disk screen test.  The cefoxitin screen and oxacillin work in combination to determine the final interpretation reported for oxacillin.     Inducible Clindamycin Value in next row Sensitive      POSITIVECEFOXITIN SCREEN - This test may be used to predict mecA-mediated oxacillin resistance, and it is based on the cefoxitin disk screen test.  The cefoxitin screen and oxacillin work in combination to determine the final interpretation reported for oxacillin.     TETRACYCLINE Value in next row Sensitive      SENSITIVE<=1    * METHICILLIN RESISTANT STAPHYLOCOCCUS AUREUS  Blood Culture ID Panel (Reflexed)     Status: Abnormal   Collection Time: 07/22/15  6:21 PM  Result Value Ref Range Status   Enterococcus species NOT DETECTED NOT DETECTED Final   Listeria monocytogenes NOT DETECTED NOT DETECTED Final   Staphylococcus species DETECTED (A) NOT DETECTED Final   Staphylococcus aureus DETECTED (A) NOT DETECTED Final    Comment: CRITICAL RESULT CALLED TO, READ BACK BY AND  VERIFIED WITH: CRYSTAL SCARPENA 07/23/15 1330 ABOUT STAPH SPECIES,STAPH AUREUS, AND MecA DETECTED MLM    Streptococcus species NOT DETECTED NOT DETECTED Final   Streptococcus agalactiae NOT DETECTED NOT DETECTED Final   Streptococcus pneumoniae NOT DETECTED NOT DETECTED Final   Streptococcus pyogenes NOT DETECTED NOT DETECTED Final   Acinetobacter baumannii NOT DETECTED  NOT DETECTED Final   Enterobacteriaceae species NOT DETECTED NOT DETECTED Final   Enterobacter cloacae complex NOT DETECTED NOT DETECTED Final   Escherichia coli NOT DETECTED NOT DETECTED Final   Klebsiella oxytoca NOT DETECTED NOT DETECTED Final   Klebsiella pneumoniae NOT DETECTED NOT DETECTED Final   Proteus species NOT DETECTED NOT DETECTED Final   Serratia marcescens NOT DETECTED NOT DETECTED Final   Haemophilus influenzae NOT DETECTED NOT DETECTED Final   Neisseria meningitidis NOT DETECTED NOT DETECTED Final   Pseudomonas aeruginosa NOT DETECTED NOT DETECTED Final   Candida albicans NOT DETECTED NOT DETECTED Final   Candida glabrata NOT DETECTED NOT DETECTED Final   Candida krusei NOT DETECTED NOT DETECTED Final   Candida parapsilosis NOT DETECTED NOT DETECTED Final   Candida tropicalis NOT DETECTED NOT DETECTED Final   Carbapenem resistance NOT DETECTED NOT DETECTED Final   Methicillin resistance DETECTED (A) NOT DETECTED Final   Vancomycin resistance NOT DETECTED NOT DETECTED Final  MRSA PCR Screening     Status: Abnormal   Collection Time: 07/23/15  5:24 AM  Result Value Ref Range Status   MRSA by PCR POSITIVE (A) NEGATIVE Final    Comment:        The GeneXpert MRSA Assay (FDA approved for NASAL specimens only), is one component of a comprehensive MRSA colonization surveillance program. It is not intended to diagnose MRSA infection nor to guide or monitor treatment for MRSA infections. CRITICAL RESULT CALLED TO, READ BACK BY AND VERIFIED WITH: ALISHA BABB ON 07/23/15 AT 0735 BY QSD   CULTURE, BLOOD  (ROUTINE X 2) w Reflex to PCR ID Panel     Status: None   Collection Time: 07/25/15  6:08 AM  Result Value Ref Range Status   Specimen Description BLOOD LEFT AC  Final   Special Requests BOTTLES DRAWN AEROBIC AND ANAEROBIC ANA2ML AER3ML  Final   Culture NO GROWTH 5 DAYS  Final   Report Status 07/30/2015 FINAL  Final  CULTURE, BLOOD (ROUTINE X 2) w Reflex to PCR ID Panel     Status: None   Collection Time: 07/25/15  6:08 AM  Result Value Ref Range Status   Specimen Description BLOOD LEFT WRIST  Final   Special Requests   Final    BOTTLES DRAWN AEROBIC AND ANAEROBIC ANA AER   Culture NO GROWTH 5 DAYS  Final   Report Status 07/30/2015 FINAL  Final  Cath Tip Culture     Status: None   Collection Time: 07/28/15  7:32 PM  Result Value Ref Range Status   Specimen Description CATH TIP  Final   Special Requests NONE  Final   Culture NO GROWTH 3 DAYS  Final   Report Status 07/31/2015 FINAL  Final  C difficile quick scan w PCR reflex     Status: Abnormal   Collection Time: 07/31/15 10:28 AM  Result Value Ref Range Status   C Diff antigen POSITIVE (A) NEGATIVE Final   C Diff toxin NEGATIVE NEGATIVE Final   C Diff interpretation   Final    Positive for toxigenic C. difficile, active toxin production not detected. Patient has toxigenic C. difficile organisms present in the bowel, but toxin was not detected. The patient may be a carrier or the level of toxin in the sample was below the limit  of detection. This information should be used in conjunction with the patient's clinical history when deciding on possible therapy.   Clostridium Difficile by PCR     Status:  Abnormal   Collection Time: 07/31/15 10:28 AM  Result Value Ref Range Status   Toxigenic C Difficile by pcr POSITIVE (A) NEGATIVE Final    Comment: CRITICAL RESULT CALLED TO, READ BACK BY AND VERIFIED WITH: TAYLOR BECK 07/31/15 AT 1211 VKB     Coagulation Studies: No results for input(s): LABPROT, INR in the last 72  hours.  Urinalysis: No results for input(s): COLORURINE, LABSPEC, PHURINE, GLUCOSEU, HGBUR, BILIRUBINUR, KETONESUR, PROTEINUR, UROBILINOGEN, NITRITE, LEUKOCYTESUR in the last 72 hours.  Invalid input(s): APPERANCEUR    Imaging: No results found.   Medications:     . allopurinol  100 mg Oral Daily  . aspirin EC  81 mg Oral Daily  . atorvastatin  20 mg Oral Daily  .  ceFAZolin (ANCEF) IV  1 g Intravenous On Call  . Chlorhexidine Gluconate Cloth  6 each Topical Q0600  . clopidogrel  75 mg Oral Daily  . diltiazem  240 mg Oral Daily  . epoetin (EPOGEN/PROCRIT) injection  10,000 Units Intravenous Q T,Th,Sa-HD  . ferrous sulfate  325 mg Oral Daily  . heparin  5,000 Units Subcutaneous 3 times per day  . latanoprost  1 drop Both Eyes QHS  . metoprolol tartrate  25 mg Oral BID  . metroNIDAZOLE  500 mg Oral 3 times per day  . mupirocin ointment  1 application Nasal BID  . pregabalin  75 mg Oral Daily  . vancomycin  750 mg Intravenous Q T,Th,Sa-HD   sodium chloride, sodium chloride, acetaminophen, benzonatate, heparin, lidocaine (PF), lidocaine-prilocaine, naLOXone (NARCAN)  injection, oxyCODONE-acetaminophen, pentafluoroprop-tetrafluoroeth  Assessment/ Plan:  82 y.o. black male  with right eye blindness, diastolic congestive heart failure, hypertension, gout, glaucoma, peripheral neuropathy, atrial fibrillation, anemia, carotid stenosis status post bilateral CEA, CVA, peripheral vascular disease   [facility]/ TTS  1. End Stage Renal Disease: tolerating treatment well. Continue TTS schedule.  Complication of hemodialysis access: tunneled catheter has been dislodged. Now temp femoarl line placed. Will need tunneled catheter before discharge.  - MRSA sepsis. Cath tip culture and blood cultures with no growth from 07/24/14. Currently on vanco.  2. Anemia of chronic kidney disease: Hemoglobin down to 8.2 - continue epogen 10000 units IV with HD.   3.  Secondary Hyperparathyroidism: not currently on a binder or vitamin D agent.  - most recent calcium and phosphorus at goal.   4.  Hypertension: hypotensive due to sepsis.  - metoprolol and diltiazem. Now being held for dialysis later today.    LOS: 11 Trasean Delima 1/25/20173:39 PM

## 2015-08-02 NOTE — Care Management Note (Signed)
For perm cath today

## 2015-08-02 NOTE — Progress Notes (Signed)
Pt returned from specials s/p permacath insertion to rt upper chest wall, double lumen.  Dressing dry and intact, no bleeding noted at this time.  Denies need. CB in reach, SR up x 2, bed alarm on.

## 2015-08-02 NOTE — Progress Notes (Signed)
Report to floor nurse.by Jeani Hawking

## 2015-08-02 NOTE — Op Note (Signed)
OPERATIVE NOTE   PROCEDURE: 1. Insertion of tunneled dialysis catheter right IJ approach with ultrasound and fluoroscopic guidance.  PRE-OPERATIVE DIAGNOSIS: End stage renal disease; complication of dialysis device  POST-OPERATIVE DIAGNOSIS:Same  SURGEON: Schnier, Latina Craver.  ANESTHESIA: Conscious sedation was administered under my direct supervision. IV Versed plus fentanyl were utilized. Continuous ECG, pulse oximetry and blood pressure was monitored throughout the entire procedure. Conscious sedation was for a total of 30 minutes.  ESTIMATED BLOOD LOSS: Minimal cc  CONTRAST USED:  None  FLUOROSCOPY TIME:  2 minutes  INDICATIONS:   Sean Delgadois a 82 y.o. y.o. male who presents with catheter-related sepsis. His infection has now been cleared and he is undergoing placement of a tunneled dialysis catheter. The risks and benefits have been explained all questions of been answered patient has agreed to proceed..  DESCRIPTION: After obtaining full informed written consent, the patient was positioned supine. The right neck and chest wall was prepped and draped in a sterile fashion. Ultrasound was placed in a sterile sleeve. Ultrasound was utilized to identify the right internal jugular vein which is noted to be echolucent and compressible indicating patency. Images recorded for the permanent record. Under real-time visualization a Seldinger needle is inserted into the vein and the guidewires advanced without difficulty. Small counterincision was made at the wire insertion site. Dilators are passed over the wire and the tunneled dialysis catheter is fed into the central venous system without difficulty.  Under fluoroscopy the catheter tip positioned at the atrial caval junction. The catheter is then approximated to the chest wall and an exit site selected. 1% lidocaine is infiltrated in soft tissues at this level small incision is made and the tunneling device is then passed from the exit  site to the neck counterincision. Catheter is then connected to the tunneling device and the catheter was pulled subcutaneously. It is then transected and the hub assembly connected without difficulty. Both lumens aspirate and flush easily. After verification of smooth contour with proper tip position under fluoroscopy the catheter is packed with 5000 units of heparin per lumen.  Catheter secured to the skin of the right chest wall with 0 silk. A sterile dressing is applied with a Biopatch.  COMPLICATIONS: None  CONDITION: Good  Schnier, Latina Craver [place] [facility]. Office:  [phone]   08/02/2015,5:33 PM

## 2015-08-02 NOTE — Care Management Important Message (Signed)
Important Message  Patient Details  Name: Allah Reason. MRN: [id number] Date of Birth: [birth year]   Medicare Important Message Given:  Yes    Marily Memos, RN 08/02/2015, 9:56 AM

## 2015-08-02 NOTE — Progress Notes (Signed)
Pharmacy Antibiotic Follow-up Note  Sean Delgado. is a 82 y.o. year-old male admitted on 07/22/2015.  The patient is currently on day 11 of Vancomycin   for MRSA bacteremia and aspiration pneumonia.  Assessment/Plan: 1/23: Vancomycin dose decreased to 750 mg each dialysis. Trough level ordered to be drawn prior to the dialysis session on 1/28  Per ID MD note, planning for at least 2 weeks of vancomycin.  Vancomycin Doses:  1/21 at 2215 - 750 mg at HD 1/24 at 1527 - 750 mg at HD   Temp (24hrs), Avg:97.7 F (36.5 C), Min:97.1 F (36.2 C), Max:98.3 F (36.8 C)   Recent Labs Lab 07/28/15 0456 07/29/15 0418 08/01/15 0927  WBC 12.6* 15.3* 9.5     Recent Labs Lab 07/28/15 0456 07/29/15 0418 08/01/15 0927  CREATININE 2.81* 4.32* 5.73*   Estimated Creatinine Clearance: 11.8 mL/min (by C-G formula based on Cr of 5.73).    No Known Allergies  Antimicrobials this admission: Vanc 1/14 >> Unasyn 1/16 >>1/17 Zosyn 1/14 >> 1/16, 1/20>>1/23  Levels/dose changes this admission: 1/21 VT at 2003 = 26  Microbiology results: 1/14 BCx: Staph Aureus mecA gene positive  1/14 MRSA PCR: positive  1/21:  Vancomycin trough = 26 mcg/mL Will decrease maintenance dose to Vancomycin 750 mg IV Q TTS -HD.  Will recheck Vanc trough before next 3rd HD session  on 1/28.   Marty Heck PharmD 08/02/2015 12:20 PM

## 2015-08-02 NOTE — Progress Notes (Signed)
Patient ID: Sean Delgado., male   DOB: [birth year], 82 y.o.   MRN: [id number] [facility] PROGRESS NOTE  Juel Bellerose. [id number]:[id number] DOB: Apr 03, 1934 DOA: 07/22/2015 PCP: Bobbye Morton, MD  HPI/Subjective: Patient seen earlier. He is having the dialysis catheter placed today. He offers no complaints. Nurse states that he had one episode of diarrhea overnight  Objective: Filed Vitals:   08/02/15 1312 08/02/15 1500  BP: 101/55 108/57  Pulse: 115 106  Temp: 99.2 F (37.3 C) 97 F (36.1 C)  Resp: 20 22    Filed Weights   07/31/15 0023 08/01/15 1315 08/01/15 1635  Weight: 91.491 kg (201 lb 11.2 oz) 92.8 kg (204 lb 9.4 oz) 90.7 kg (199 lb 15.3 oz)    ROS: Review of Systems  Constitutional: Negative for fever and chills.  Eyes: Negative for blurred vision.  Respiratory: Positive for cough. Negative for shortness of breath.   Cardiovascular: Negative for chest pain.  Gastrointestinal: Positive for diarrhea. Negative for nausea, vomiting, abdominal pain and constipation.  Genitourinary: Negative for dysuria.  Musculoskeletal: Negative for joint pain.  Neurological: Negative for dizziness and headaches.   Exam: Physical Exam  HENT:  Nose: No mucosal edema.  Mouth/Throat: No oropharyngeal exudate or posterior oropharyngeal edema.  Eyes: Conjunctivae, EOM and lids are normal. Pupils are equal, round, and reactive to light.  Neck: No JVD present. Carotid bruit is not present. No edema present. No thyroid mass and no thyromegaly present.  Cardiovascular: S1 normal and S2 normal.  An irregularly irregular rhythm present. Exam reveals no gallop.   No murmur heard. Pulses:      Dorsalis pedis pulses are 2+ on the right side, and 2+ on the left side.  Respiratory: No respiratory distress. He has no wheezes. He has rhonchi in the right lower field and the left lower field. He has no rales.  GI: Soft. Bowel sounds are normal. There is no tenderness.  Musculoskeletal:      Right ankle: He exhibits swelling.       Left ankle: He exhibits swelling.  Lymphadenopathy:    He has no cervical adenopathy.  Neurological: He is alert.  Skin: Skin is warm. No rash noted. Nails show no clubbing.  Psychiatric: He has a normal mood and affect.      Data Reviewed: Basic Metabolic Panel:  Recent Labs Lab 07/27/15 1247 07/28/15 0456 07/29/15 0418 08/01/15 0927  NA  --  143 144 138  K  --  2.8* 4.8 3.7  CL  --  103 107 98*  CO2  --  32 29 30  GLUCOSE  --  99 116* 109*  BUN  --  11 24* 41*  CREATININE  --  2.81* 4.32* 5.73*  CALCIUM  --  8.2* 8.3* 8.2*  MG  --  2.0  --   --   PHOS 4.5  --   --  5.0*   CBC:  Recent Labs Lab 07/28/15 0456 07/29/15 0418 08/01/15 0927  WBC 12.6* 15.3* 9.5  HGB 8.2* 8.3* 8.2*  HCT 27.6* 27.4* 28.1*  MCV 87.2 86.6 86.0  PLT 282 294 341   BNP (last 3 results)  Recent Labs  03/04/15 1212 04/08/15 1621 05/30/15 0844  BNP 560.0* 763.0* 724.0*   CBG:  Recent Labs Lab 07/28/15 2148 07/30/15 1746  GLUCAP 118* 94    Recent Results (from the past 240 hour(s))  CULTURE, BLOOD (ROUTINE X 2) w Reflex to PCR ID Panel     Status:  None   Collection Time: 07/25/15  6:08 AM  Result Value Ref Range Status   Specimen Description BLOOD LEFT AC  Final   Special Requests BOTTLES DRAWN AEROBIC AND ANAEROBIC ANA2ML AER3ML  Final   Culture NO GROWTH 5 DAYS  Final   Report Status 07/30/2015 FINAL  Final  CULTURE, BLOOD (ROUTINE X 2) w Reflex to PCR ID Panel     Status: None   Collection Time: 07/25/15  6:08 AM  Result Value Ref Range Status   Specimen Description BLOOD LEFT WRIST  Final   Special Requests   Final    BOTTLES DRAWN AEROBIC AND ANAEROBIC ANA AER   Culture NO GROWTH 5 DAYS  Final   Report Status 07/30/2015 FINAL  Final  Cath Tip Culture     Status: None   Collection Time: 07/28/15  7:32 PM  Result Value Ref Range Status   Specimen Description CATH TIP  Final   Special Requests NONE  Final    Culture NO GROWTH 3 DAYS  Final   Report Status 07/31/2015 FINAL  Final  C difficile quick scan w PCR reflex     Status: Abnormal   Collection Time: 07/31/15 10:28 AM  Result Value Ref Range Status   C Diff antigen POSITIVE (A) NEGATIVE Final   C Diff toxin NEGATIVE NEGATIVE Final   C Diff interpretation   Final    Positive for toxigenic C. difficile, active toxin production not detected. Patient has toxigenic C. difficile organisms present in the bowel, but toxin was not detected. The patient may be a carrier or the level of toxin in the sample was below the limit  of detection. This information should be used in conjunction with the patient's clinical history when deciding on possible therapy.   Clostridium Difficile by PCR     Status: Abnormal   Collection Time: 07/31/15 10:28 AM  Result Value Ref Range Status   Toxigenic C Difficile by pcr POSITIVE (A) NEGATIVE Final    Comment: CRITICAL RESULT CALLED TO, READ BACK BY AND VERIFIED WITH: TAYLOR BECK 07/31/15 AT 1211 VKB      Scheduled Meds: . [MAR Hold] allopurinol  100 mg Oral Daily  . [MAR Hold] aspirin EC  81 mg Oral Daily  . [MAR Hold] atorvastatin  20 mg Oral Daily  .  ceFAZolin (ANCEF) IV  1 g Intravenous On Call  . [MAR Hold] Chlorhexidine Gluconate Cloth  6 each Topical Q0600  . [MAR Hold] clopidogrel  75 mg Oral Daily  . [MAR Hold] diltiazem  240 mg Oral Daily  . [MAR Hold] epoetin (EPOGEN/PROCRIT) injection  10,000 Units Intravenous Q T,Th,Sa-HD  . [MAR Hold] ferrous sulfate  325 mg Oral Daily  . [MAR Hold] heparin  5,000 Units Subcutaneous 3 times per day  . [MAR Hold] latanoprost  1 drop Both Eyes QHS  . [MAR Hold] metoprolol tartrate  25 mg Oral BID  . [MAR Hold] metroNIDAZOLE  500 mg Oral 3 times per day  . [MAR Hold] mupirocin ointment  1 application Nasal BID  . [MAR Hold] pregabalin  75 mg Oral Daily  . [MAR Hold] vancomycin  750 mg Intravenous Q T,Th,Sa-HD   Assessment/Plan:  1. Atrial fibrillation with  rapid ventricular response. Cardizem CD 240 mg daily and metoprolol 25 mg twice a day.  Blood pressure borderline low. Patient on aspirin and Plavix for anticoagulation. 2. MRSA sepsis- patient on IV vancomycin. This will be continued with dialysis for 2 weeks. Stop  date to 08/09/15 as per Dr. Sampson Goon 3. Clostridium difficile colitis- oral Flagyl to be continued 10 days after vancomycin finishes stop date 08/19/2015 4. Pneumonia, likely aspiration- completed course of Zosyn 5. End-stage renal disease on hemodialysis as per nephrology. Vascular surgery to place catheter today 6. Anemia of chronic disease on ferrous sulfate, Epogen with dialysis 7. Neuropathy on Lyrica 8. Glaucoma on latanoprost 9. Hyperlipidemia unspecified on atorvastatin 10. History of stroke 23. Palliative care consultation for goals of care.  Code Status:     Code Status Orders        Start     Ordered   07/22/15 2038  Full code   Continuous     07/22/15 2038    Code Status History    Date Active Date Inactive Code Status Order ID Comments User Context   06/24/2015  4:07 PM 06/27/2015 12:37 AM Full Code [id number]  Auburn Bilberry, MD Inpatient   05/30/2015  5:22 PM 06/05/2015  6:42 PM Full Code [id number]  Trudee Kuster, RN Inpatient   05/30/2015  1:59 PM 05/30/2015  5:22 PM DNR [id number]  Shaune Pollack, MD Inpatient   05/29/2015  3:57 PM 05/29/2015  7:32 PM Full Code [id number]  Annice Needy, MD Inpatient   04/08/2015  8:31 PM 04/17/2015 11:42 PM Full Code [id number]  Gracelyn Nurse, MD Inpatient   03/04/2015  4:13 PM 03/11/2015  7:00 PM DNR [id number]  Houston Siren, MD Inpatient     Disposition Plan: Potential back to facility on Thursday after dialysis  Consultants:  Nephrology  Vascular surgery  Palliative care  Procedures:  Left IJ permacath removed with sepsis  Temporary dialysis catheter placed  Antibiotics:  Vancomycin  Oral Flagyl  Time spent: 20 minutes  Alford Highland  [facility]  Hospitalists

## 2015-08-02 NOTE — Progress Notes (Signed)
Floor nurse notified that Mr Krenz 's daughter noticed a broken down area on his left heel back that she has never noticed before while he was in the specials recovery unit.

## 2015-08-02 NOTE — Plan of Care (Signed)
Problem: Physical Regulation: Goal: Will remain free from infection Outcome: Not Progressing On antibiotics, + MRSA/CDIFF

## 2015-08-03 ENCOUNTER — Encounter: Payer: Self-pay | Admitting: Vascular Surgery

## 2015-08-03 DIAGNOSIS — R197 Diarrhea, unspecified: Secondary | ICD-10-CM

## 2015-08-03 DIAGNOSIS — Z515 Encounter for palliative care: Secondary | ICD-10-CM

## 2015-08-03 DIAGNOSIS — I739 Peripheral vascular disease, unspecified: Secondary | ICD-10-CM

## 2015-08-03 DIAGNOSIS — Z992 Dependence on renal dialysis: Secondary | ICD-10-CM

## 2015-08-03 DIAGNOSIS — R1310 Dysphagia, unspecified: Secondary | ICD-10-CM

## 2015-08-03 DIAGNOSIS — N186 End stage renal disease: Secondary | ICD-10-CM

## 2015-08-03 DIAGNOSIS — J690 Pneumonitis due to inhalation of food and vomit: Secondary | ICD-10-CM

## 2015-08-03 DIAGNOSIS — B967 Clostridium perfringens [C. perfringens] as the cause of diseases classified elsewhere: Secondary | ICD-10-CM

## 2015-08-03 DIAGNOSIS — R6521 Severe sepsis with septic shock: Secondary | ICD-10-CM

## 2015-08-03 DIAGNOSIS — I2510 Atherosclerotic heart disease of native coronary artery without angina pectoris: Secondary | ICD-10-CM

## 2015-08-03 DIAGNOSIS — A4102 Sepsis due to Methicillin resistant Staphylococcus aureus: Secondary | ICD-10-CM

## 2015-08-03 DIAGNOSIS — I120 Hypertensive chronic kidney disease with stage 5 chronic kidney disease or end stage renal disease: Secondary | ICD-10-CM

## 2015-08-03 LAB — CBC
HEMATOCRIT: 27.1 % — AB (ref 40.0–52.0)
Hemoglobin: 8.2 g/dL — ABNORMAL LOW (ref 13.0–18.0)
MCH: 26.4 pg (ref 26.0–34.0)
MCHC: 30.4 g/dL — AB (ref 32.0–36.0)
MCV: 86.7 fL (ref 80.0–100.0)
Platelets: 339 10*3/uL (ref 150–440)
RBC: 3.13 MIL/uL — ABNORMAL LOW (ref 4.40–5.90)
RDW: 22.3 % — AB (ref 11.5–14.5)
WBC: 8.3 10*3/uL (ref 3.8–10.6)

## 2015-08-03 LAB — RENAL FUNCTION PANEL
ALBUMIN: 1.7 g/dL — AB (ref 3.5–5.0)
Anion gap: 9 (ref 5–15)
BUN: 30 mg/dL — AB (ref 6–20)
CO2: 30 mmol/L (ref 22–32)
Calcium: 8.1 mg/dL — ABNORMAL LOW (ref 8.9–10.3)
Chloride: 103 mmol/L (ref 101–111)
Creatinine, Ser: 5.61 mg/dL — ABNORMAL HIGH (ref 0.61–1.24)
GFR calc Af Amer: 10 mL/min — ABNORMAL LOW (ref >60)
GFR calc non Af Amer: 9 mL/min — ABNORMAL LOW (ref >60)
GLUCOSE: 85 mg/dL (ref 65–99)
PHOSPHORUS: 6.3 mg/dL — AB (ref 2.5–4.6)
POTASSIUM: 3.4 mmol/L — AB (ref 3.5–5.1)
SODIUM: 142 mmol/L (ref 135–145)

## 2015-08-03 LAB — TSH: TSH: 2.176 u[IU]/mL (ref 0.350–4.500)

## 2015-08-03 MED ORDER — MEGESTROL ACETATE 400 MG/10ML PO SUSP
400.0000 mg | Freq: Two times a day (BID) | ORAL | Status: DC
  Administered 2015-08-03 – 2015-08-04 (×3): 400 mg via ORAL
  Filled 2015-08-03 (×3): qty 10

## 2015-08-03 NOTE — Progress Notes (Signed)
Patient ID: Sean Dirr., male   DOB: 09/04/1933, 82 y.o.   MRN: [id number] [facility] PROGRESS NOTE  Sean Delgado. [id number] DOB: [birth year] DOA: 07/22/2015 PCP: Sean Morton, MD  HPI/Subjective: Patient had his catheter for dialysis placed yesterday. Currently is any significant complaint denies any chest pain or shortness of breath. Had one bowel movement liquidy    Objective: Filed Vitals:   08/02/15 2214 08/03/15 0603  BP: 102/50 117/61  Pulse: 92 103  Temp:  98.3 F (36.8 C)  Resp:  16    Filed Weights   07/31/15 0023 08/01/15 1315 08/01/15 1635  Weight: 91.491 kg (201 lb 11.2 oz) 92.8 kg (204 lb 9.4 oz) 90.7 kg (199 lb 15.3 oz)    ROS: Review of Systems  Constitutional: Negative for fever and chills.  Eyes: Negative for blurred vision.  Respiratory: Positive for cough. Negative for shortness of breath.   Cardiovascular: Negative for chest pain.  Gastrointestinal: Positive for diarrhea. Negative for nausea, vomiting, abdominal pain and constipation.  Genitourinary: Negative for dysuria.  Musculoskeletal: Negative for joint pain.  Neurological: Negative for dizziness and headaches.   Exam: Physical Exam  HENT:  Nose: No mucosal edema.  Mouth/Throat: No oropharyngeal exudate or posterior oropharyngeal edema.  Eyes: Conjunctivae, EOM and lids are normal. Pupils are equal, round, and reactive to light.  Neck: No JVD present. Carotid bruit is not present. No edema present. No thyroid mass and no thyromegaly present.  Cardiovascular: S1 normal and S2 normal.  An irregularly irregular rhythm present. Exam reveals no gallop.   No murmur heard. Pulses:      Dorsalis pedis pulses are 2+ on the right side, and 2+ on the left side.  Respiratory: No respiratory distress. He has no wheezes. He has rhonchi in the right lower field and the left lower field. He has no rales.  GI: Soft. Bowel sounds are normal. There is no tenderness.  Musculoskeletal:        Right ankle: He exhibits swelling.       Left ankle: He exhibits swelling.  Lymphadenopathy:    He has no cervical adenopathy.  Neurological: He is alert.  Skin: Skin is warm. No rash noted. Nails show no clubbing.  Psychiatric: He has a normal mood and affect.      Data Reviewed: Basic Metabolic Panel:  Recent Labs Lab 07/27/15 1247 07/28/15 0456 07/29/15 0418 08/01/15 0927 08/02/15 1856  NA  --  143 144 138  --   K  --  2.8* 4.8 3.7  --   CL  --  103 107 98*  --   CO2  --  32 29 30  --   GLUCOSE  --  99 116* 109*  --   BUN  --  11 24* 41*  --   CREATININE  --  2.81* 4.32* 5.73*  --   CALCIUM  --  8.2* 8.3* 8.2*  --   MG  --  2.0  --   --   --   PHOS 4.5  --   --  5.0* 4.9*   CBC:  Recent Labs Lab 07/28/15 0456 07/29/15 0418 08/01/15 0927  WBC 12.6* 15.3* 9.5  HGB 8.2* 8.3* 8.2*  HCT 27.6* 27.4* 28.1*  MCV 87.2 86.6 86.0  PLT 282 294 341   BNP (last 3 results)  Recent Labs  03/04/15 1212 04/08/15 1621 05/30/15 0844  BNP 560.0* 763.0* 724.0*   CBG:  Recent Labs Lab 07/28/15 2148 07/30/15  1746  GLUCAP 118* 94    Recent Results (from the past 240 hour(s))  CULTURE, BLOOD (ROUTINE X 2) w Reflex to PCR ID Panel     Status: None   Collection Time: 07/25/15  6:08 AM  Result Value Ref Range Status   Specimen Description BLOOD LEFT AC  Final   Special Requests BOTTLES DRAWN AEROBIC AND ANAEROBIC ANA2ML AER3ML  Final   Culture NO GROWTH 5 DAYS  Final   Report Status 07/30/2015 FINAL  Final  CULTURE, BLOOD (ROUTINE X 2) w Reflex to PCR ID Panel     Status: None   Collection Time: 07/25/15  6:08 AM  Result Value Ref Range Status   Specimen Description BLOOD LEFT WRIST  Final   Special Requests   Final    BOTTLES DRAWN AEROBIC AND ANAEROBIC ANA AER   Culture NO GROWTH 5 DAYS  Final   Report Status 07/30/2015 FINAL  Final  Cath Tip Culture     Status: None   Collection Time: 07/28/15  7:32 PM  Result Value Ref Range Status   Specimen  Description CATH TIP  Final   Special Requests NONE  Final   Culture NO GROWTH 3 DAYS  Final   Report Status 07/31/2015 FINAL  Final  C difficile quick scan w PCR reflex     Status: Abnormal   Collection Time: 07/31/15 10:28 AM  Result Value Ref Range Status   C Diff antigen POSITIVE (A) NEGATIVE Final   C Diff toxin NEGATIVE NEGATIVE Final   C Diff interpretation   Final    Positive for toxigenic C. difficile, active toxin production not detected. Patient has toxigenic C. difficile organisms present in the bowel, but toxin was not detected. The patient may be a carrier or the level of toxin in the sample was below the limit  of detection. This information should be used in conjunction with the patient's clinical history when deciding on possible therapy.   Clostridium Difficile by PCR     Status: Abnormal   Collection Time: 07/31/15 10:28 AM  Result Value Ref Range Status   Toxigenic C Difficile by pcr POSITIVE (A) NEGATIVE Final    Comment: CRITICAL RESULT CALLED TO, READ BACK BY AND VERIFIED WITH: TAYLOR BECK 07/31/15 AT 1211 VKB      Scheduled Meds: . allopurinol  100 mg Oral Daily  . aspirin EC  81 mg Oral Daily  . atorvastatin  20 mg Oral Daily  . Chlorhexidine Gluconate Cloth  6 each Topical Q0600  . clopidogrel  75 mg Oral Daily  . diltiazem  240 mg Oral Daily  . epoetin (EPOGEN/PROCRIT) injection  10,000 Units Intravenous Q T,Th,Sa-HD  . ferrous sulfate  325 mg Oral Daily  . heparin  5,000 Units Subcutaneous 3 times per day  . latanoprost  1 drop Both Eyes QHS  . metoprolol tartrate  25 mg Oral BID  . metroNIDAZOLE  500 mg Oral 3 times per day  . mupirocin ointment  1 application Nasal BID  . pregabalin  75 mg Oral Daily  . vancomycin  750 mg Intravenous Q T,Th,Sa-HD   Assessment/Plan:  1. Atrial fibrillation with rapid ventricular response. Continue Cardizem CD 240 mg daily and metoprolol 25 mg twice a day.  Patient on aspirin and Plavix for anticoagulation. Heart  rate relatively stable 2. MRSA sepsis- patient on IV vancomycin. This will be continued with dialysis for 2 weeks. Stop date to 08/09/15 as per Dr. Sampson Goon 3. Clostridium  difficile colitis- oral Flagyl to be continued 10 days after vancomycin finishes stop date 08/19/2015 4. Pneumonia, likely aspiration- completed course of Zosyn 5. End-stage renal disease on hemodialysis as per nephrology. Status post catheter placement 6. Anemia of chronic disease on ferrous sulfate, Epogen with dialysis 7. Neuropathy on Lyrica 8. Glaucoma on latanoprost 9. Hyperlipidemia unspecified on atorvastatin 10. History of stroke 65. Palliative care consultation for goals of care. Prognosis poor  Code Status:     Code Status Orders        Start     Ordered   07/22/15 2038  Full code   Continuous     07/22/15 2038    Code Status History    Date Active Date Inactive Code Status Order ID Comments User Context   06/24/2015  4:07 PM 06/27/2015 12:37 AM Full Code [id number]  Auburn Bilberry, MD Inpatient   05/30/2015  5:22 PM 06/05/2015  6:42 PM Full Code [id number]  Trudee Kuster, RN Inpatient   05/30/2015  1:59 PM 05/30/2015  5:22 PM DNR [id number]  Shaune Pollack, MD Inpatient   05/29/2015  3:57 PM 05/29/2015  7:32 PM Full Code [id number]  Annice Needy, MD Inpatient   04/08/2015  8:31 PM 04/17/2015 11:42 PM Full Code [id number]  Gracelyn Nurse, MD Inpatient   03/04/2015  4:13 PM 03/11/2015  7:00 PM DNR [id number]  Houston Siren, MD Inpatient     Disposition Plan: Discharge back to facility soon as ablated tomorrow  Consultants:  Nephrology  Vascular surgery  Palliative care  Procedures:  Left IJ permacath removed with sepsis  Temporary dialysis catheter placed  Antibiotics:  Vancomycin  Oral Flagyl  Time spent: 20 minutes  Jmarion Christiano, [facility]  [facility] Hospitalists

## 2015-08-03 NOTE — Progress Notes (Signed)
HD Tx Initiation 

## 2015-08-03 NOTE — Progress Notes (Signed)
Post HD TX Assessment 

## 2015-08-03 NOTE — Progress Notes (Signed)
Pre HD Tx Machine & Patient Checks 

## 2015-08-03 NOTE — Progress Notes (Signed)
CSW spoke to MD Allena Katz he reports that patient will possibly be ready for discharge tomorrow. Per MD Orvan Falconer she will see patient today for Palliative Care consult. CSW left message for Gavin Pound, admission coordinator for [facility] to ifnorm her of above. CSW will continue to follow and assist.   Woodroe Mode, MSW, LCSW-A Clinical Social Work Department [phone]

## 2015-08-03 NOTE — Progress Notes (Signed)
Post HD Tx Notes 

## 2015-08-03 NOTE — Progress Notes (Signed)
HD TX Termination 

## 2015-08-03 NOTE — Consult Note (Addendum)
Palliative Medicine Inpatient Consult Note   Name: Sean Delgado. Date: 08/03/2015 MRN: [id number]  DOB: 12/13/1933  Referring Physician: Auburn Bilberry, MD  Palliative Care consult requested for this 82 y.o. male for goals of medical therapy in patient with a known history of end-stage renal disease on hemodialysis, hypertension, CHF, glaucoma, neuropathy, stroke, peripheral vascular disease, coronary artery disease, A. fib, right eye blindness, who lives in a nursing home.  He was sent here due to a cough and it was noted that his permacath was partly pulled out also.  Pt has a known dysphagia and had aspiration penumonia and sepsis at admission.  He is approaching being ready for discharge back to facility.  He has been found also to have C Diff (with one liquid stool today) and Afib with RVR.  He has a very poor prognosis given his many chronic medical problems.    _______________________________________________  TODAY'S OBSERVATIONS,  DISCUSSIONS AND DECISIONS:  My Observations:  Today, it appears that  ---he has had one liquid stool last night  ---His blood pressure runs low normal at 96/56 to 110/46.  ---Resp rate is OK and sats are good on 3 LPM (unclear if he has Ox normally)  ---permacath has been replaced as of yesterday  ---he is to be on a dysphagia 1 diet with Honey thickened liquids per several evaluations by SLP and documentation about what he tolerates.    ---most recent nutrition note states he ate about 100% of breatkfast on 1/21 but then intake was 75% or so on 1/18 and then 50% at lunch on 1/20.  Documentation is lacking for other meals recently.   I learned that sometimes, dialysis interferes with pts mealtimes as it did this morning (he did not get breakfast b/c HD started early and staff could not feed him during HD).  Today, I spoke directly to the aide before he fed pt lunch and also right after. Pt just now ate half of his meat, a little bit of potato, some of  green beans and a bite of roll (dysphagia diet????). He also drank about 25 % of tea.  Total estimate for lunch is 25% and of course it was zero for breakfast due to HD taking place at that time.    I have realized that pt was to be on a Dysphagia 1 diet with HONEY thick liquids and this was changed recently following a procedure.  I have already changed his orders back to Dysphagia 1 with HONEY thick liquids.    Pts albumin is 1.7 which indicates severe malnutrition and or severe protein losses.  He is not reported to have any open wounds which might have leaked albumin, and I do not see a diagnosis of cirrhosis.  His protein losses are likely partly related to severe protein calorie malnutrition and partly due to recent/ current C Diff diarrhea with intake worsened due to this infection. His albumin was 2.1 on 06/25/15 --so this drop down to 1.7 is very significant.  Dialysis won't likely interfere with meals going forward.  But he will still have a prognosis that is quite poor for long term survival. He meets criteria for Hospice care due to severe malnutrition.  Additionally, he does have pain that needs treatment.    I am also now learning that pt is a [facility] pt.  I have not seen evidence of their involvement while he has been here, however.  But --I spoke to Child psychotherapist who tells me they have called  her and gotten several updates.    DISCUSSIONS and PLANS: 1.  Pt was able to make his own healthcare decisions on this day.  2.  He tells me he would like to remain 'Full Code' for now.    3.  He tells me he does NOT WANT A FEEDING TUBE, however.  He understands he needs to eat more (see above info about dysphagia diet texture).    IMPRESSION: MRSA Sepsis  ---to get Vancomycin at HD for two week ---ID has seen pt and manages ABX orders Sepsis due to Pneumonia at this admission ---with septic shock (hypotension) AspirationPneumonia Afib with Rapid Ventricular Response C Diff  colitis ESRD HTN Diastolic CHF in the past ---his EF is 55-65 %  Glaucoma Neuropathy History of CVA CAD AFib Dysrhythmia Right Eye Bliindness Iron Deficiency Anemia Anemia of chronic disease (renal disease) Pleural Effusions Former Smoker (0.25 packs/ day for 15 yrs) Hypokalemia Dyslipidemia Dialysis access issues ---now has new permacath insertion to right upper chest wall on 08/02/15.  Skin breakdown on left heel Dysphagia --TO BE ON DYSPHAGIA 1 WITH HONEY THICK LIQUIDS AND NEEDS FULL SUPERVISION AND ASPIRATION PRECAUTIONS WITH MEALS.    REVIEW OF SYSTEMS:  He feels OK today but needs some help adjusting his bed position. He got full after 25 % of lunch.    SPIRITUAL SUPPORT SYSTEM: Yes.  SOCIAL HISTORY:  reports that he quit smoking about 8 years ago. His smoking use included Cigarettes. He has a 3.75 pack-year smoking history. He has never used smokeless tobacco. He reports that he does not drink alcohol or use illicit drugs. Pts niece is [name] [phone] .  Pt was long term care resident at [facility] [place].  Only insurance listed is 'managed Medicare', however (Generic Medicare Advantage).  BUT his long term care is covered by the [facility] program.    LEGAL DOCUMENTS:  None  CODE STATUS: Full code  -per pts request this will continue (discussed with him on 08/02/15.  He says 'try it one time'.    PAST MEDICAL HISTORY: Past Medical History  Diagnosis Date  . ESRD (end stage renal disease) (HCC)   . Hypertension   . CHF (congestive heart failure) (HCC)   . Gout   . Glaucoma   . Neuropathy (HCC)   . Stroke (HCC)   . Peripheral vascular disease (HCC)   . Coronary artery disease   . A-fib (HCC)   . Dysrhythmia   . Blindness of right eye   . Anemia     iron deficiency    PAST SURGICAL HISTORY:  Past Surgical History  Procedure Laterality Date  . Vascular surgery Bilateral     Carotid Endarterectomy  . Eye surgery Left     Cataract Extraction  . Av  fistula placement Left 04/06/2015    Procedure: ARTERIOVENOUS (AV) FISTULA CREATION;  Surgeon: Annice Needy, MD;  Location: [facility];  Service: Vascular;  Laterality: Left;  . Peripheral vascular catheterization N/A 04/12/2015    Procedure: Dialysis/Perma Catheter Insertion;  Surgeon: Annice Needy, MD;  Location: [facility];  Service: Cardiovascular;  Laterality: N/A;  . Peripheral vascular catheterization N/A 05/18/2015    Procedure: Dialysis/Perma Catheter Insertion;  Surgeon: Annice Needy, MD;  Location: [facility];  Service: Cardiovascular;  Laterality: N/A;  . Peripheral vascular catheterization Left 05/29/2015    Procedure: A/V Shuntogram/Fistulagram;  Surgeon: Annice Needy, MD;  Location: [facility];  Service: Cardiovascular;  Laterality:  Left;  . Peripheral vascular catheterization N/A 05/29/2015    Procedure: A/V Shunt Intervention;  Surgeon: Annice Needy, MD;  Location: [facility];  Service: Cardiovascular;  Laterality: N/A;  . Peripheral vascular catheterization N/A 06/26/2015    Procedure: Dialysis/Perma Catheter Insertion;  Surgeon: Annice Needy, MD;  Location: [facility];  Service: Cardiovascular;  Laterality: N/A;  . Av fistula placement Right 06/29/2015    Procedure: BRACHIAL CEPHALIC AV FISTULA;  Surgeon: Annice Needy, MD;  Location: [facility];  Service: Vascular;  Laterality: Right;  . Peripheral vascular catheterization N/A 08/02/2015    Procedure: Dialysis/Perma Catheter Insertion;  Surgeon: Renford Dills, MD;  Location: [facility];  Service: Cardiovascular;  Laterality: N/A;    ALLERGIES:  has No Known Allergies.  MEDICATIONS:  Current Facility-Administered Medications  Medication Dose Route Frequency Provider Last Rate Last Dose  . 0.9 %  sodium chloride infusion  100 mL Intravenous PRN Munsoor Lateef, MD      . 0.9 %  sodium chloride infusion  100 mL Intravenous PRN Munsoor Lateef, MD      . acetaminophen (TYLENOL)  tablet 650 mg  650 mg Oral Q6H PRN Alford Highland, MD   650 mg at 08/03/15 0556  . allopurinol (ZYLOPRIM) tablet 100 mg  100 mg Oral Daily Altamese Dilling, MD   100 mg at 08/01/15 1010  . aspirin EC tablet 81 mg  81 mg Oral Daily Altamese Dilling, MD   81 mg at 08/01/15 1011  . atorvastatin (LIPITOR) tablet 20 mg  20 mg Oral Daily Altamese Dilling, MD   20 mg at 08/01/15 1010  . benzonatate (TESSALON) capsule 100 mg  100 mg Oral TID PRN Shaune Pollack, MD   100 mg at 07/27/15 0442  . Chlorhexidine Gluconate Cloth 2 % PADS 6 each  6 each Topical Q0600 Alford Highland, MD   6 each at 08/03/15 0600  . clopidogrel (PLAVIX) tablet 75 mg  75 mg Oral Daily Altamese Dilling, MD   75 mg at 08/01/15 1010  . diltiazem (CARDIZEM CD) 24 hr capsule 240 mg  240 mg Oral Daily Alford Highland, MD   240 mg at 07/31/15 0942  . epoetin alfa (EPOGEN,PROCRIT) injection 10,000 Units  10,000 Units Intravenous Q T,Th,Sa-HD Munsoor Lateef, MD   10,000 Units at 08/03/15 0958  . ferrous sulfate tablet 325 mg  325 mg Oral Daily Altamese Dilling, MD   325 mg at 08/01/15 1011  . heparin injection 1,000 Units  1,000 Units Dialysis PRN Munsoor Lateef, MD      . heparin injection 5,000 Units  5,000 Units Subcutaneous 3 times per day Altamese Dilling, MD   5,000 Units at 08/03/15 0631  . latanoprost (XALATAN) 0.005 % ophthalmic solution 1 drop  1 drop Both Eyes QHS Altamese Dilling, MD   1 drop at 08/02/15 2223  . lidocaine (PF) (XYLOCAINE) 1 % injection 5 mL  5 mL Intradermal PRN Munsoor Lateef, MD      . lidocaine-prilocaine (EMLA) cream 1 application  1 application Topical PRN Munsoor Lateef, MD      . metoprolol tartrate (LOPRESSOR) tablet 25 mg  25 mg Oral BID Alford Highland, MD   25 mg at 08/02/15 2223  . metroNIDAZOLE (FLAGYL) tablet 500 mg  500 mg Oral 3 times per day Clydie Braun, MD   500 mg at 08/03/15 0556  . mupirocin ointment (BACTROBAN) 2 % 1 application  1 application Nasal BID  Alford Highland, MD  1 application at 08/02/15 2223  . naloxone [facility]) injection 2 mg  2 mg Intravenous PRN Arnaldo Natal, MD   2 mg at 07/22/15 1746  . oxyCODONE-acetaminophen (PERCOCET/ROXICET) 5-325 MG per tablet 1 tablet  1 tablet Oral Q6H PRN Shaune Pollack, MD   1 tablet at 07/28/15 1222  . pentafluoroprop-tetrafluoroeth (GEBAUERS) aerosol 1 application  1 application Topical PRN Munsoor Lateef, MD      . pregabalin (LYRICA) capsule 75 mg  75 mg Oral Daily Altamese Dilling, MD   75 mg at 08/01/15 1010  . vancomycin (VANCOCIN) IVPB 750 mg/150 ml premix  750 mg Intravenous Q T,Th,Sa-HD Alford Highland, MD   750 mg at 08/03/15 1144    Vital Signs: BP 102/56 mmHg  Pulse 114  Temp(Src) 98.7 F (37.1 C) (Axillary)  Resp 18  Ht  (1.854 m)  Wt 92.7 kg (204 lb 5.9 oz)  BMI 26.97 kg/m2  SpO2 100% Filed Weights   08/01/15 1315 08/01/15 1635 08/03/15 0900  Weight: 92.8 kg (204 lb 9.4 oz) 90.7 kg (199 lb 15.3 oz) 92.7 kg (204 lb 5.9 oz)    Estimated body mass index is 26.97 kg/(m^2) as calculated from the following:   Height as of this encounter:  (1.854 m).   Weight as of this encounter: 92.7 kg (204 lb 5.9 oz).  PERFORMANCE STATUS (ECOG) : 4 - Bedbound  PHYSICAL EXAM: Alert Verbal and making sense EOMI OP clear No JVD or TM Hrt rrr no mgr Lungs cta Abd soft and NT Skin w/o mottling or cyanosis   LABS: CBC:    Component Value Date/Time   WBC 8.3 08/03/2015 0940   HGB 8.2* 08/03/2015 0940   HCT 27.1* 08/03/2015 0940   PLT 339 08/03/2015 0940   MCV 86.7 08/03/2015 0940   NEUTROABS 10.9* 07/22/2015 1506   LYMPHSABS 0.8* 07/22/2015 1506   MONOABS 0.9 07/22/2015 1506   EOSABS 0.0 07/22/2015 1506   BASOSABS 0.1 07/22/2015 1506   Comprehensive Metabolic Panel:    Component Value Date/Time   NA 142 08/03/2015 0940   K 3.4* 08/03/2015 0940   CL 103 08/03/2015 0940   CO2 30 08/03/2015 0940   BUN 30* 08/03/2015 0940   CREATININE 5.61* 08/03/2015 0940    GLUCOSE 85 08/03/2015 0940   CALCIUM 8.1* 08/03/2015 0940   AST 27 05/30/2015 0911   ALT 18 05/30/2015 0911   ALKPHOS 99 05/30/2015 0911   BILITOT 1.1 05/30/2015 0911   PROT 8.2* 05/30/2015 0911   ALBUMIN 1.7* 08/03/2015 0940    More than 50% of the visit was spent in counseling/coordination of care: Yes  Time Spent:  120 minutes  (largely related to investigative work associated with trying to ascertain how much pt eats (25% - 50%) and what texture diet he is supposed to be on.  --also counselling pt and making calls to care team.  )  I will sign off at this time.

## 2015-08-03 NOTE — Progress Notes (Signed)
Subjective:   Seen and examined on hemodialysis. Tolerating treatment well. UF of 1 litre Some diarrhea over night RIJ permcath placed yesterday 1/25 Dr. Gilda Crease  Objective:  Vital signs in last 24 hours:  Temp:  [97 F (36.1 C)-99.2 F (37.3 C)] 98.7 F (37.1 C) (01/26 0900) Pulse Rate:  [92-134] 104 (01/26 1100) Resp:  [14-31] 16 (01/26 1100) BP: (96-123)/(37-63) 100/53 mmHg (01/26 1100) SpO2:  [90 %-100 %] 96 % (01/26 1100) Weight:  [92.7 kg (204 lb 5.9 oz)] 92.7 kg (204 lb 5.9 oz) (01/26 0900)  Weight change:  Filed Weights   08/01/15 1315 08/01/15 1635 08/03/15 0900  Weight: 92.8 kg (204 lb 9.4 oz) 90.7 kg (199 lb 15.3 oz) 92.7 kg (204 lb 5.9 oz)    Intake/Output:    Intake/Output Summary (Last 24 hours) at 08/03/15 1122 Last data filed at 08/03/15 0238  Gross per 24 hour  Intake      0 ml  Output      0 ml  Net      0 ml     Physical Exam: General:  no acute distress, laying in the bed   HEENT  anicteric, moist oral mucous membranes , right eye blindness  Neck  supple   Pulm/lungs  clear to auscultation bilateral, normal effort  CVS/Heart  S1S2 irregular  Abdomen:   soft, nontender, nondistended   Extremities:  no peripheral edema   Neurologic:  alert, oriented, follows commands  Skin:  no acute rashes   Access: RIJ permcath 08/02/15 Dr. Gilda Crease. Right AVF maturing with bruit and thrill       Basic Metabolic Panel:   Recent Labs Lab 07/27/15 1247  07/28/15 0456 07/29/15 0418 08/01/15 0927 08/02/15 1856 08/03/15 0940  NA  --   --  143 144 138  --  142  K  --   --  2.8* 4.8 3.7  --  3.4*  CL  --   --  103 107 98*  --  103  CO2  --   --  32 29 30  --  30  GLUCOSE  --   --  99 116* 109*  --  85  BUN  --   --  11 24* 41*  --  30*  CREATININE  --   --  2.81* 4.32* 5.73*  --  5.61*  CALCIUM  --   < > 8.2* 8.3* 8.2*  --  8.1*  MG  --   --  2.0  --   --   --   --   PHOS 4.5  --   --   --  5.0* 4.9* 6.3*  < > = values in this interval not  displayed.   CBC:  Recent Labs Lab 07/28/15 0456 07/29/15 0418 08/01/15 0927 08/03/15 0940  WBC 12.6* 15.3* 9.5 8.3  HGB 8.2* 8.3* 8.2* 8.2*  HCT 27.6* 27.4* 28.1* 27.1*  MCV 87.2 86.6 86.0 86.7  PLT 282 294 341 339      Microbiology:  Recent Results (from the past 720 hour(s))  Culture, blood (routine x 2)     Status: None   Collection Time: 07/22/15  6:21 PM  Result Value Ref Range Status   Specimen Description BLOOD LEFT HAND  Final   Special Requests BOTTLES DRAWN AEROBIC AND ANAEROBIC  4CC  Final   Culture  Setup Time   Final    GRAM POSITIVE COCCI IN CLUSTERS AEROBIC BOTTLE ONLY CRITICAL RESULT CALLED TO, READ BACK BY  AND VERIFIED WITH: CRYSTAL SCARPENA 07/23/15 1330 MLM    Culture   Final    METHICILLIN RESISTANT STAPHYLOCOCCUS AUREUS AEROBIC BOTTLE ONLY    Report Status 07/27/2015 FINAL  Final   Organism ID, Bacteria METHICILLIN RESISTANT STAPHYLOCOCCUS AUREUS  Final      Susceptibility   Methicillin resistant staphylococcus aureus - MIC*    CIPROFLOXACIN >=8 RESISTANT Resistant     ERYTHROMYCIN <=0.25 SENSITIVE Sensitive     GENTAMICIN <=0.5 SENSITIVE Sensitive     OXACILLIN >=4 RESISTANT Resistant     VANCOMYCIN 1 SENSITIVE Sensitive     TRIMETH/SULFA <=10 SENSITIVE Sensitive     CLINDAMYCIN <=0.25 SENSITIVE Sensitive     CEFOXITIN SCREEN Value in next row Resistant      POSITIVECEFOXITIN SCREEN - This test may be used to predict mecA-mediated oxacillin resistance, and it is based on the cefoxitin disk screen test.  The cefoxitin screen and oxacillin work in combination to determine the final interpretation reported for oxacillin.     Inducible Clindamycin Value in next row Sensitive      POSITIVECEFOXITIN SCREEN - This test may be used to predict mecA-mediated oxacillin resistance, and it is based on the cefoxitin disk screen test.  The cefoxitin screen and oxacillin work in combination to determine the final interpretation reported for oxacillin.      TETRACYCLINE Value in next row Sensitive      SENSITIVE<=1    * METHICILLIN RESISTANT STAPHYLOCOCCUS AUREUS  Culture, blood (routine x 2)     Status: None   Collection Time: 07/22/15  6:21 PM  Result Value Ref Range Status   Specimen Description BLOOD LEFT HAND  Final   Special Requests   Final    BOTTLES DRAWN AEROBIC AND ANAEROBIC  AER 2CC ANA 4CC   Culture  Setup Time   Final    GRAM POSITIVE COCCI IN CLUSTERS IN BOTH AEROBIC AND ANAEROBIC BOTTLES CRITICAL RESULT CALLED TO, READ BACK BY AND VERIFIED WITH: CRYSTAL SCARPENA 07/23/15 1400 MLM    Culture METHICILLIN RESISTANT STAPHYLOCOCCUS AUREUS  Final   Report Status 07/27/2015 FINAL  Final   Organism ID, Bacteria METHICILLIN RESISTANT STAPHYLOCOCCUS AUREUS  Final      Susceptibility   Methicillin resistant staphylococcus aureus - MIC*    CIPROFLOXACIN >=8 RESISTANT Resistant     ERYTHROMYCIN 0.5 SENSITIVE Sensitive     GENTAMICIN <=0.5 SENSITIVE Sensitive     OXACILLIN >=4 RESISTANT Resistant     VANCOMYCIN 1 SENSITIVE Sensitive     TRIMETH/SULFA <=10 SENSITIVE Sensitive     CLINDAMYCIN <=0.25 SENSITIVE Sensitive     CEFOXITIN SCREEN Value in next row Resistant      POSITIVECEFOXITIN SCREEN - This test may be used to predict mecA-mediated oxacillin resistance, and it is based on the cefoxitin disk screen test.  The cefoxitin screen and oxacillin work in combination to determine the final interpretation reported for oxacillin.     Inducible Clindamycin Value in next row Sensitive      POSITIVECEFOXITIN SCREEN - This test may be used to predict mecA-mediated oxacillin resistance, and it is based on the cefoxitin disk screen test.  The cefoxitin screen and oxacillin work in combination to determine the final interpretation reported for oxacillin.     TETRACYCLINE Value in next row Sensitive      SENSITIVE<=1    * METHICILLIN RESISTANT STAPHYLOCOCCUS AUREUS  Blood Culture ID Panel (Reflexed)     Status: Abnormal   Collection Time:  07/22/15  6:21 PM  Result Value Ref Range Status   Enterococcus species NOT DETECTED NOT DETECTED Final   Listeria monocytogenes NOT DETECTED NOT DETECTED Final   Staphylococcus species DETECTED (A) NOT DETECTED Final   Staphylococcus aureus DETECTED (A) NOT DETECTED Final    Comment: CRITICAL RESULT CALLED TO, READ BACK BY AND VERIFIED WITH: CRYSTAL SCARPENA 07/23/15 1330 ABOUT STAPH SPECIES,STAPH AUREUS, AND MecA DETECTED MLM    Streptococcus species NOT DETECTED NOT DETECTED Final   Streptococcus agalactiae NOT DETECTED NOT DETECTED Final   Streptococcus pneumoniae NOT DETECTED NOT DETECTED Final   Streptococcus pyogenes NOT DETECTED NOT DETECTED Final   Acinetobacter baumannii NOT DETECTED NOT DETECTED Final   Enterobacteriaceae species NOT DETECTED NOT DETECTED Final   Enterobacter cloacae complex NOT DETECTED NOT DETECTED Final   Escherichia coli NOT DETECTED NOT DETECTED Final   Klebsiella oxytoca NOT DETECTED NOT DETECTED Final   Klebsiella pneumoniae NOT DETECTED NOT DETECTED Final   Proteus species NOT DETECTED NOT DETECTED Final   Serratia marcescens NOT DETECTED NOT DETECTED Final   Haemophilus influenzae NOT DETECTED NOT DETECTED Final   Neisseria meningitidis NOT DETECTED NOT DETECTED Final   Pseudomonas aeruginosa NOT DETECTED NOT DETECTED Final   Candida albicans NOT DETECTED NOT DETECTED Final   Candida glabrata NOT DETECTED NOT DETECTED Final   Candida krusei NOT DETECTED NOT DETECTED Final   Candida parapsilosis NOT DETECTED NOT DETECTED Final   Candida tropicalis NOT DETECTED NOT DETECTED Final   Carbapenem resistance NOT DETECTED NOT DETECTED Final   Methicillin resistance DETECTED (A) NOT DETECTED Final   Vancomycin resistance NOT DETECTED NOT DETECTED Final  MRSA PCR Screening     Status: Abnormal   Collection Time: 07/23/15  5:24 AM  Result Value Ref Range Status   MRSA by PCR POSITIVE (A) NEGATIVE Final    Comment:        The GeneXpert MRSA Assay  (FDA approved for NASAL specimens only), is one component of a comprehensive MRSA colonization surveillance program. It is not intended to diagnose MRSA infection nor to guide or monitor treatment for MRSA infections. CRITICAL RESULT CALLED TO, READ BACK BY AND VERIFIED WITH: ALISHA BABB ON 07/23/15 AT 0735 BY QSD   CULTURE, BLOOD (ROUTINE X 2) w Reflex to PCR ID Panel     Status: None   Collection Time: 07/25/15  6:08 AM  Result Value Ref Range Status   Specimen Description BLOOD LEFT AC  Final   Special Requests BOTTLES DRAWN AEROBIC AND ANAEROBIC ANA2ML AER3ML  Final   Culture NO GROWTH 5 DAYS  Final   Report Status 07/30/2015 FINAL  Final  CULTURE, BLOOD (ROUTINE X 2) w Reflex to PCR ID Panel     Status: None   Collection Time: 07/25/15  6:08 AM  Result Value Ref Range Status   Specimen Description BLOOD LEFT WRIST  Final   Special Requests   Final    BOTTLES DRAWN AEROBIC AND ANAEROBIC ANA AER   Culture NO GROWTH 5 DAYS  Final   Report Status 07/30/2015 FINAL  Final  Cath Tip Culture     Status: None   Collection Time: 07/28/15  7:32 PM  Result Value Ref Range Status   Specimen Description CATH TIP  Final   Special Requests NONE  Final   Culture NO GROWTH 3 DAYS  Final   Report Status 07/31/2015 FINAL  Final  C difficile quick scan w PCR reflex     Status: Abnormal   Collection Time: 07/31/15  10:28 AM  Result Value Ref Range Status   C Diff antigen POSITIVE (A) NEGATIVE Final   C Diff toxin NEGATIVE NEGATIVE Final   C Diff interpretation   Final    Positive for toxigenic C. difficile, active toxin production not detected. Patient has toxigenic C. difficile organisms present in the bowel, but toxin was not detected. The patient may be a carrier or the level of toxin in the sample was below the limit  of detection. This information should be used in conjunction with the patient's clinical history when deciding on possible therapy.   Clostridium Difficile by PCR      Status: Abnormal   Collection Time: 07/31/15 10:28 AM  Result Value Ref Range Status   Toxigenic C Difficile by pcr POSITIVE (A) NEGATIVE Final    Comment: CRITICAL RESULT CALLED TO, READ BACK BY AND VERIFIED WITH: TAYLOR BECK 07/31/15 AT 1211 VKB     Coagulation Studies: No results for input(s): LABPROT, INR in the last 72 hours.  Urinalysis: No results for input(s): COLORURINE, LABSPEC, PHURINE, GLUCOSEU, HGBUR, BILIRUBINUR, KETONESUR, PROTEINUR, UROBILINOGEN, NITRITE, LEUKOCYTESUR in the last 72 hours.  Invalid input(s): APPERANCEUR    Imaging: No results found.   Medications:     . allopurinol  100 mg Oral Daily  . aspirin EC  81 mg Oral Daily  . atorvastatin  20 mg Oral Daily  . Chlorhexidine Gluconate Cloth  6 each Topical Q0600  . clopidogrel  75 mg Oral Daily  . diltiazem  240 mg Oral Daily  . epoetin (EPOGEN/PROCRIT) injection  10,000 Units Intravenous Q T,Th,Sa-HD  . ferrous sulfate  325 mg Oral Daily  . heparin  5,000 Units Subcutaneous 3 times per day  . latanoprost  1 drop Both Eyes QHS  . metoprolol tartrate  25 mg Oral BID  . metroNIDAZOLE  500 mg Oral 3 times per day  . mupirocin ointment  1 application Nasal BID  . pregabalin  75 mg Oral Daily  . vancomycin  750 mg Intravenous Q T,Th,Sa-HD   sodium chloride, sodium chloride, acetaminophen, benzonatate, heparin, lidocaine (PF), lidocaine-prilocaine, naLOXone (NARCAN)  injection, oxyCODONE-acetaminophen, pentafluoroprop-tetrafluoroeth  Assessment/ Plan:  82 y.o. black male  with right eye blindness, diastolic congestive heart failure, hypertension, gout, glaucoma, peripheral neuropathy, atrial fibrillation, anemia, carotid stenosis status post bilateral CEA, CVA, peripheral vascular disease   [facility]/ [facility]/ TTS  1. End Stage Renal Disease: tolerating treatment well. Continue TTS schedule.  Complication of hemodialysis access: tunneled catheter has been dislodged. Now with  new permcath placed.  - MRSA sepsis. Cath tip culture and blood cultures with no growth from 07/24/14.  - vanco. Will need to continue this as outpatient.   2. Anemia of chronic kidney disease: Hemoglobin down to 8.2 - continue epogen 10000 units IV with HD.   3. Secondary Hyperparathyroidism: not currently on a binder or vitamin D agent.  - most recent calcium and phosphorus at goal.   4.  Hypertension: hypotensive due to sepsis.  - metoprolol and diltiazem. Now being held for dialysis later today.    LOS: 12 Sean Delgado 1/26/201711:22 AM

## 2015-08-03 NOTE — Progress Notes (Signed)
Nutrition Follow-up  DOCUMENTATION CODES:   Non-severe (moderate) malnutrition in context of chronic illness  INTERVENTION:  Meals and snacks: cater to pt preferences Medical Nutrition Supplement Therapy: continue honey thick mightyshake and magic cup Coordination of care: no further intervention warranted at this time. Please re-consult RD if further assistance needed Will leave note for nursing to document intake while on isolation   NUTRITION DIAGNOSIS:   Increased nutrient needs related to wound healing, chronic illness as evidenced by estimated needs.    GOAL:   Patient will meet greater than or equal to 90% of their needs    MONITOR:    (Energy intake, Electrolyte and renal profile)  REASON FOR ASSESSMENT:    (diet order)    ASSESSMENT:   Palliative care following.  Noted pt not interested in feeding tube   Current Nutrition: Pt ate 100% magic cup for this writer this pm as fed this to pt during visit.  Tolerated well. Noted diet order change per Palliative care   Gastrointestinal Profile: Last BM: 1/25   Scheduled Medications:  . allopurinol  100 mg Oral Daily  . aspirin EC  81 mg Oral Daily  . atorvastatin  20 mg Oral Daily  . Chlorhexidine Gluconate Cloth  6 each Topical Q0600  . clopidogrel  75 mg Oral Daily  . diltiazem  240 mg Oral Daily  . epoetin (EPOGEN/PROCRIT) injection  10,000 Units Intravenous Q T,Th,Sa-HD  . ferrous sulfate  325 mg Oral Daily  . heparin  5,000 Units Subcutaneous 3 times per day  . latanoprost  1 drop Both Eyes QHS  . megestrol  400 mg Oral BID  . metoprolol tartrate  25 mg Oral BID  . metroNIDAZOLE  500 mg Oral 3 times per day  . mupirocin ointment  1 application Nasal BID  . pregabalin  75 mg Oral Daily  . vancomycin  750 mg Intravenous Q T,Th,Sa-HD        Electrolyte/Renal Profile and Glucose Profile:   Recent Labs Lab 07/28/15 0456 07/29/15 0418 08/01/15 0927 08/02/15 1856 08/03/15 0940  NA 143 144  138  --  142  K 2.8* 4.8 3.7  --  3.4*  CL 103 107 98*  --  103  CO2 32 29 30  --  30  BUN 11 24* 41*  --  30*  CREATININE 2.81* 4.32* 5.73*  --  5.61*  CALCIUM 8.2* 8.3* 8.2*  --  8.1*  MG 2.0  --   --   --   --   PHOS  --   --  5.0* 4.9* 6.3*  GLUCOSE 99 116* 109*  --  85   Protein Profile:  Recent Labs Lab 08/01/15 0927 08/03/15 0940  ALBUMIN 1.7* 1.7*        Weight Trend since Admission: Filed Weights   08/01/15 1635 08/03/15 0900 08/03/15 1250  Weight: 199 lb 15.3 oz (90.7 kg) 204 lb 5.9 oz (92.7 kg) 202 lb 13.2 oz (92 kg)      Diet Order:  Diet - low sodium heart healthy DIET - DYS 1 Room service appropriate?: Yes; Fluid consistency:: Honey Thick  Skin:   reviewed   Height:   Ht Readings from Last 1 Encounters:  07/28/15  (1.854 m)    Weight:   Wt Readings from Last 1 Encounters:  08/03/15 202 lb 13.2 oz (92 kg)    Ideal Body Weight:     BMI:  Body mass index is 26.77 kg/(m^2).  Estimated Nutritional  Needs:   Kcal:  BEE 1688 kcals (IF 1.1-1.3, AF 1.2) 1610-9604 kcals/d.   Protein:  (1.2-1.5 g/kg) 110-138 g/d  Fluid:  (1000 + UOP)  EDUCATION NEEDS:   No education needs identified at this time  LOW Care Level  Emilea Goga B. Freida Busman, RD, LDN [phone] (pager) Weekend/On-Call pager [phone])

## 2015-08-03 NOTE — Progress Notes (Signed)
Pre HD Tx Assessment 

## 2015-08-04 ENCOUNTER — Inpatient Hospital Stay: Payer: Medicare (Managed Care)

## 2015-08-04 MED ORDER — MEGESTROL ACETATE 400 MG/10ML PO SUSP: 400.0000 mg | Freq: Two times a day (BID) | ORAL | Status: AC

## 2015-08-04 MED ORDER — BENZONATATE 100 MG PO CAPS: 100.0000 mg | ORAL_CAPSULE | Freq: Three times a day (TID) | ORAL | Status: AC | PRN

## 2015-08-04 MED ORDER — MUPIROCIN 2 % EX OINT: 1.0000 "application " | TOPICAL_OINTMENT | Freq: Two times a day (BID) | CUTANEOUS | Status: AC

## 2015-08-04 MED ORDER — METRONIDAZOLE 500 MG PO TABS: 500.0000 mg | ORAL_TABLET | Freq: Three times a day (TID) | ORAL | Status: DC

## 2015-08-04 MED ORDER — VANCOMYCIN HCL IN DEXTROSE 750-5 MG/150ML-% IV SOLN: 750.0000 mg | INTRAVENOUS | Status: DC

## 2015-08-04 NOTE — Progress Notes (Signed)
Clinical Social Worker was informed that patient will be medically ready to discharge to [facility]. Patient is in a agreement with plan. CSW called [facility] to confirm that patient's bed is ready. Provided patient's room number 206 and number to call for report [phone]. All discharge information faxed to [facility] via [facility]. [facility]charge Summary faxed to [facility]. [facility] to [facility] to [facility] at 10:30 AM via [facility] [place]. [facility] will provide a [facility] and O2 tank.   Call to patient's niece Louie Bun to inform her patient would discharge to [facility]. RN will call report and patient will discharge to [facility] via [facility] [place].  Woodroe Mode, MSW, LCSW-A Clinical Social Work Department [phone]

## 2015-08-04 NOTE — Progress Notes (Signed)
Subjective:   Hemodialysis yesterday. Tolerated treatment well. UF 0.5L  Objective:  Vital signs in last 24 hours:  Temp:  [97.9 F (36.6 C)-98 F (36.7 C)] 98 F (36.7 C) (01/27 0409) Pulse Rate:  [92-118] 108 (01/27 0409) Resp:  [16-22] 22 (01/27 0409) BP: (96-118)/(46-93) 112/52 mmHg (01/27 0753) SpO2:  [90 %-100 %] 100 % (01/27 0409) Weight:  [92 kg (202 lb 13.2 oz)] 92 kg (202 lb 13.2 oz) (01/26 1250)  Weight change:  Filed Weights   08/01/15 1635 08/03/15 0900 08/03/15 1250  Weight: 90.7 kg (199 lb 15.3 oz) 92.7 kg (204 lb 5.9 oz) 92 kg (202 lb 13.2 oz)    Intake/Output:    Intake/Output Summary (Last 24 hours) at 08/04/15 1009 Last data filed at 08/04/15 1610  Gross per 24 hour  Intake    100 ml  Output    500 ml  Net   -400 ml     Physical Exam: General:  no acute distress, laying in the bed   HEENT  anicteric, moist oral mucous membranes , right eye blindness  Neck  supple   Pulm/lungs  clear to auscultation bilateral, normal effort  CVS/Heart  S1S2 irregular  Abdomen:   soft, nontender, nondistended   Extremities:  no peripheral edema   Neurologic:  alert, oriented, follows commands  Skin:  no acute rashes   Access: RIJ permcath 08/02/15 Dr. Gilda Crease. Right AVF maturing with bruit and thrill       Basic Metabolic Panel:   Recent Labs Lab 07/29/15 0418 08/01/15 0927 08/02/15 1856 08/03/15 0940  NA 144 138  --  142  K 4.8 3.7  --  3.4*  CL 107 98*  --  103  CO2 29 30  --  30  GLUCOSE 116* 109*  --  85  BUN 24* 41*  --  30*  CREATININE 4.32* 5.73*  --  5.61*  CALCIUM 8.3* 8.2*  --  8.1*  PHOS  --  5.0* 4.9* 6.3*     CBC:  Recent Labs Lab 07/29/15 0418 08/01/15 0927 08/03/15 0940  WBC 15.3* 9.5 8.3  HGB 8.3* 8.2* 8.2*  HCT 27.4* 28.1* 27.1*  MCV 86.6 86.0 86.7  PLT 294 341 339      Microbiology:  Recent Results (from the past 720 hour(s))  Culture, blood (routine x 2)     Status: None   Collection Time: 07/22/15  6:21 PM   Result Value Ref Range Status   Specimen Description BLOOD LEFT HAND  Final   Special Requests BOTTLES DRAWN AEROBIC AND ANAEROBIC  4CC  Final   Culture  Setup Time   Final    GRAM POSITIVE COCCI IN CLUSTERS AEROBIC BOTTLE ONLY CRITICAL RESULT CALLED TO, READ BACK BY AND VERIFIED WITH: CRYSTAL SCARPENA 07/23/15 1330 MLM    Culture   Final    METHICILLIN RESISTANT STAPHYLOCOCCUS AUREUS AEROBIC BOTTLE ONLY    Report Status 07/27/2015 FINAL  Final   Organism ID, Bacteria METHICILLIN RESISTANT STAPHYLOCOCCUS AUREUS  Final      Susceptibility   Methicillin resistant staphylococcus aureus - MIC*    CIPROFLOXACIN >=8 RESISTANT Resistant     ERYTHROMYCIN <=0.25 SENSITIVE Sensitive     GENTAMICIN <=0.5 SENSITIVE Sensitive     OXACILLIN >=4 RESISTANT Resistant     VANCOMYCIN 1 SENSITIVE Sensitive     TRIMETH/SULFA <=10 SENSITIVE Sensitive     CLINDAMYCIN <=0.25 SENSITIVE Sensitive     CEFOXITIN SCREEN Value in next row Resistant  POSITIVECEFOXITIN SCREEN - This test may be used to predict mecA-mediated oxacillin resistance, and it is based on the cefoxitin disk screen test.  The cefoxitin screen and oxacillin work in combination to determine the final interpretation reported for oxacillin.     Inducible Clindamycin Value in next row Sensitive      POSITIVECEFOXITIN SCREEN - This test may be used to predict mecA-mediated oxacillin resistance, and it is based on the cefoxitin disk screen test.  The cefoxitin screen and oxacillin work in combination to determine the final interpretation reported for oxacillin.     TETRACYCLINE Value in next row Sensitive      SENSITIVE<=1    * METHICILLIN RESISTANT STAPHYLOCOCCUS AUREUS  Culture, blood (routine x 2)     Status: None   Collection Time: 07/22/15  6:21 PM  Result Value Ref Range Status   Specimen Description BLOOD LEFT HAND  Final   Special Requests   Final    BOTTLES DRAWN AEROBIC AND ANAEROBIC  AER 2CC ANA 4CC   Culture  Setup Time   Final     GRAM POSITIVE COCCI IN CLUSTERS IN BOTH AEROBIC AND ANAEROBIC BOTTLES CRITICAL RESULT CALLED TO, READ BACK BY AND VERIFIED WITH: CRYSTAL SCARPENA 07/23/15 1400 MLM    Culture METHICILLIN RESISTANT STAPHYLOCOCCUS AUREUS  Final   Report Status 07/27/2015 FINAL  Final   Organism ID, Bacteria METHICILLIN RESISTANT STAPHYLOCOCCUS AUREUS  Final      Susceptibility   Methicillin resistant staphylococcus aureus - MIC*    CIPROFLOXACIN >=8 RESISTANT Resistant     ERYTHROMYCIN 0.5 SENSITIVE Sensitive     GENTAMICIN <=0.5 SENSITIVE Sensitive     OXACILLIN >=4 RESISTANT Resistant     VANCOMYCIN 1 SENSITIVE Sensitive     TRIMETH/SULFA <=10 SENSITIVE Sensitive     CLINDAMYCIN <=0.25 SENSITIVE Sensitive     CEFOXITIN SCREEN Value in next row Resistant      POSITIVECEFOXITIN SCREEN - This test may be used to predict mecA-mediated oxacillin resistance, and it is based on the cefoxitin disk screen test.  The cefoxitin screen and oxacillin work in combination to determine the final interpretation reported for oxacillin.     Inducible Clindamycin Value in next row Sensitive      POSITIVECEFOXITIN SCREEN - This test may be used to predict mecA-mediated oxacillin resistance, and it is based on the cefoxitin disk screen test.  The cefoxitin screen and oxacillin work in combination to determine the final interpretation reported for oxacillin.     TETRACYCLINE Value in next row Sensitive      SENSITIVE<=1    * METHICILLIN RESISTANT STAPHYLOCOCCUS AUREUS  Blood Culture ID Panel (Reflexed)     Status: Abnormal   Collection Time: 07/22/15  6:21 PM  Result Value Ref Range Status   Enterococcus species NOT DETECTED NOT DETECTED Final   Listeria monocytogenes NOT DETECTED NOT DETECTED Final   Staphylococcus species DETECTED (A) NOT DETECTED Final   Staphylococcus aureus DETECTED (A) NOT DETECTED Final    Comment: CRITICAL RESULT CALLED TO, READ BACK BY AND VERIFIED WITH: CRYSTAL SCARPENA 07/23/15 1330 ABOUT STAPH  SPECIES,STAPH AUREUS, AND MecA DETECTED MLM    Streptococcus species NOT DETECTED NOT DETECTED Final   Streptococcus agalactiae NOT DETECTED NOT DETECTED Final   Streptococcus pneumoniae NOT DETECTED NOT DETECTED Final   Streptococcus pyogenes NOT DETECTED NOT DETECTED Final   Acinetobacter baumannii NOT DETECTED NOT DETECTED Final   Enterobacteriaceae species NOT DETECTED NOT DETECTED Final   Enterobacter cloacae complex NOT DETECTED NOT  DETECTED Final   Escherichia coli NOT DETECTED NOT DETECTED Final   Klebsiella oxytoca NOT DETECTED NOT DETECTED Final   Klebsiella pneumoniae NOT DETECTED NOT DETECTED Final   Proteus species NOT DETECTED NOT DETECTED Final   Serratia marcescens NOT DETECTED NOT DETECTED Final   Haemophilus influenzae NOT DETECTED NOT DETECTED Final   Neisseria meningitidis NOT DETECTED NOT DETECTED Final   Pseudomonas aeruginosa NOT DETECTED NOT DETECTED Final   Candida albicans NOT DETECTED NOT DETECTED Final   Candida glabrata NOT DETECTED NOT DETECTED Final   Candida krusei NOT DETECTED NOT DETECTED Final   Candida parapsilosis NOT DETECTED NOT DETECTED Final   Candida tropicalis NOT DETECTED NOT DETECTED Final   Carbapenem resistance NOT DETECTED NOT DETECTED Final   Methicillin resistance DETECTED (A) NOT DETECTED Final   Vancomycin resistance NOT DETECTED NOT DETECTED Final  MRSA PCR Screening     Status: Abnormal   Collection Time: 07/23/15  5:24 AM  Result Value Ref Range Status   MRSA by PCR POSITIVE (A) NEGATIVE Final    Comment:        The GeneXpert MRSA Assay (FDA approved for NASAL specimens only), is one component of a comprehensive MRSA colonization surveillance program. It is not intended to diagnose MRSA infection nor to guide or monitor treatment for MRSA infections. CRITICAL RESULT CALLED TO, READ BACK BY AND VERIFIED WITH: ALISHA BABB ON 07/23/15 AT 0735 BY QSD   CULTURE, BLOOD (ROUTINE X 2) w Reflex to PCR ID Panel     Status: None    Collection Time: 07/25/15  6:08 AM  Result Value Ref Range Status   Specimen Description BLOOD LEFT AC  Final   Special Requests BOTTLES DRAWN AEROBIC AND ANAEROBIC ANA2ML AER3ML  Final   Culture NO GROWTH 5 DAYS  Final   Report Status 07/30/2015 FINAL  Final  CULTURE, BLOOD (ROUTINE X 2) w Reflex to PCR ID Panel     Status: None   Collection Time: 07/25/15  6:08 AM  Result Value Ref Range Status   Specimen Description BLOOD LEFT WRIST  Final   Special Requests   Final    BOTTLES DRAWN AEROBIC AND ANAEROBIC ANA AER   Culture NO GROWTH 5 DAYS  Final   Report Status 07/30/2015 FINAL  Final  Cath Tip Culture     Status: None   Collection Time: 07/28/15  7:32 PM  Result Value Ref Range Status   Specimen Description CATH TIP  Final   Special Requests NONE  Final   Culture NO GROWTH 3 DAYS  Final   Report Status 07/31/2015 FINAL  Final  C difficile quick scan w PCR reflex     Status: Abnormal   Collection Time: 07/31/15 10:28 AM  Result Value Ref Range Status   C Diff antigen POSITIVE (A) NEGATIVE Final   C Diff toxin NEGATIVE NEGATIVE Final   C Diff interpretation   Final    Positive for toxigenic C. difficile, active toxin production not detected. Patient has toxigenic C. difficile organisms present in the bowel, but toxin was not detected. The patient may be a carrier or the level of toxin in the sample was below the limit  of detection. This information should be used in conjunction with the patient's clinical history when deciding on possible therapy.   Clostridium Difficile by PCR     Status: Abnormal   Collection Time: 07/31/15 10:28 AM  Result Value Ref Range Status   Toxigenic C Difficile by  pcr POSITIVE (A) NEGATIVE Final    Comment: CRITICAL RESULT CALLED TO, READ BACK BY AND VERIFIED WITH: TAYLOR BECK 07/31/15 AT 1211 VKB     Coagulation Studies: No results for input(s): LABPROT, INR in the last 72 hours.  Urinalysis: No results for input(s): COLORURINE,  LABSPEC, PHURINE, GLUCOSEU, HGBUR, BILIRUBINUR, KETONESUR, PROTEINUR, UROBILINOGEN, NITRITE, LEUKOCYTESUR in the last 72 hours.  Invalid input(s): APPERANCEUR    Imaging: Dg Chest 2 View  08/04/2015  CLINICAL DATA:  Shortness of breath.  Dialysis patient. EXAM: CHEST  2 VIEW COMPARISON:  07/28/2015 FINDINGS: There has been interval placement of of dual lumen dialysis catheter from right internal jugular approach with tip overlying the expected location of distal superior vena cava. The cardiac silhouette is enlarged. Mediastinal contours appear intact. There is no evidence of pneumothorax. There is left pleural effusion with left lower lobe airspace consolidation versus atelectasis. Osseous structures are without acute abnormality. Soft tissues are grossly normal. IMPRESSION: Dual lumen dialysis catheter in satisfactory position radiographically. Enlarged cardiac silhouette. Left lower lobe airspace consolidation with small left pleural effusion. Electronically Signed   By: Ted Mcalpine M.D.   On: 08/04/2015 08:04     Medications:     . allopurinol  100 mg Oral Daily  . aspirin EC  81 mg Oral Daily  . atorvastatin  20 mg Oral Daily  . Chlorhexidine Gluconate Cloth  6 each Topical Q0600  . clopidogrel  75 mg Oral Daily  . diltiazem  240 mg Oral Daily  . epoetin (EPOGEN/PROCRIT) injection  10,000 Units Intravenous Q T,Th,Sa-HD  . ferrous sulfate  325 mg Oral Daily  . heparin  5,000 Units Subcutaneous 3 times per day  . latanoprost  1 drop Both Eyes QHS  . megestrol  400 mg Oral BID  . metoprolol tartrate  25 mg Oral BID  . metroNIDAZOLE  500 mg Oral 3 times per day  . mupirocin ointment  1 application Nasal BID  . pregabalin  75 mg Oral Daily  . vancomycin  750 mg Intravenous Q T,Th,Sa-HD   sodium chloride, sodium chloride, acetaminophen, benzonatate, heparin, lidocaine (PF), lidocaine-prilocaine, naLOXone (NARCAN)  injection, oxyCODONE-acetaminophen,  pentafluoroprop-tetrafluoroeth  Assessment/ Plan:  82 y.o. black male  with right eye blindness, diastolic congestive heart failure, hypertension, gout, glaucoma, peripheral neuropathy, atrial fibrillation, anemia, carotid stenosis status post bilateral CEA, CVA, peripheral vascular disease   [facility]/ TTS  1. End Stage Renal Disease: tolerating treatment well. Continue TTS schedule.  Complication of hemodialysis access: tunneled catheter has been dislodged. Now with new permcath placed 1/25 Dr. Gilda Crease - MRSA sepsis. Cath tip culture and blood cultures with no growth from 07/24/14.  - vanco. Will need to continue this as outpatient.   2. Anemia of chronic kidney disease: Hemoglobin down to 8.2 - continue epogen 10000 units IV with HD.   3. Secondary Hyperparathyroidism: not currently on a binder or vitamin D agent.  - most recent calcium and phosphorus at goal.   4.  Hypertension: hypotensive due to sepsis.  - metoprolol and diltiazem. Now being held for dialysis later today.    LOS: 13 Sean Delgado 1/27/201710:09 AM

## 2015-08-04 NOTE — Progress Notes (Signed)
Report called to [facility] at [facility] [facility]. Patient status gone over with staff. Made aware patient in on 02 at 3l. Remains on contact and enteric precautions. No distress noted. Made staff aware medications need to be given with honey thick liquid due to aspiration precautions. Iv and telemetry removed. [facility] to transport patient to facility.

## 2015-08-04 NOTE — Discharge Summary (Signed)
Sean Delgado., 82 y.o., DOB 24-Apr-1934, MRN [id number]. Admission date: 07/22/2015 Discharge Date 08/04/2015 Primary MD Bobbye Morton, MD Admitting Physician Altamese Dilling, MD  Admission Diagnosis  Other specified hypotension [I95.89] Healthcare-associated pneumonia [J18.9] Complication of vascular dialysis catheter, unspecified complication, initial encounter [T82.9XXA]  Discharge Diagnosis   Active Problems:   Acute respiratory failure (HCC)   MRSA sepsis   Aspiration pneumonia (HCC)   Malnutrition of moderate degree   Aspiration pneumonia of both lungs (HCC) C. difficile colitis End-stage renal disease Atrial fibrillation with rapid ventricular rate Anemia of chronic Glaucoma Previous history of stroke Dysphagia on a modified diet        Hospital Course Sean Delgado is a 82 y.o. male with a known history of end-stage renal disease on hemodialysis, hypertension, CHF, glaucoma, neuropathy, stroke, peripheral vascular disease, coronary artery disease, A. fib, right eye blindness- lives in a nursing home. For l 2-3 days prior to admission he has increasing cough without some sputum production and he was somewhat confused and altered mental status so they decided to send him to emergency room. Patient went to dialysis Center on the day of admission as is his regular dialysis day but they did not do any dialysis because of the above-mentioned complaint and send him to emergency room. They also noted that his permacath stitches were out off and it is pulled out to half way.In ER patient was noted to have increased white cell count, tachycardia, some hypoxia, pneumonia and some pleural effusion on chest x-ray. Patient also had hypotension. He was admitted to the hospital and was further noticed to have have MRSA sepsis. Patient has been treated with vancomycin. And was also seen by infectious disease. Also during hospitalization he developed C. difficile colitis. It is recommended  by infectious disease to continue the vancomycin until February 1 and Flagyl until February 11. And has been treated with Flagyl. Patient is now afebrile WBC count is normal. In terms of his dialysis access he was seen by vascular team and had right femoral vein vascular access placed. Patient was also noticed to have aspiration pneumonia. And was been by seen by speech his diet has been modified. His prognosis is very poor and was seen by palliative care patient wanted a full code. We will also refer him to palliative care at the skilled nursing facility patient would benefit from a DO NOT RESUSCITATE status.           Consults  ID. pallative care  Significant Tests:  See full reports for all details    Dg Chest 1 View  07/28/2015  CLINICAL DATA:  82 year old male with chest pain and shortness of breath EXAM: CHEST 1 VIEW COMPARISON:  Radiograph dated 07/28/2015 FINDINGS: Single-view of the chest demonstrates cardiomegaly. The lungs are hypovolemic. There bilateral lower lobe opacities with no significant interval change compared to prior study. There is no pneumothorax. No acute osseous pathology. IMPRESSION: No significant interval change. Bilateral lower lung field airspace opacities, right greater than left. Electronically Signed   By: Elgie Collard M.D.   On: 07/28/2015 21:12   Dg Chest 1 View  07/28/2015  CLINICAL DATA:  Cough. EXAM: CHEST 1 VIEW COMPARISON:  07/22/2015 FINDINGS: Bilateral lower lobe airspace opacities are noted, increasing on the right since prior study, similar on the left. Small bilateral pleural effusions. Cardiomegaly with vascular congestion. IMPRESSION: Bilateral lower lobe airspace opacities, worsening on the right. Cannot exclude pneumonia. Bilateral effusions. Mild cardiomegaly. Electronically Signed   By: Caryn Bee  Dover M.D.   On: 07/28/2015 16:46   Dg Chest 1 View  07/22/2015  CLINICAL DATA:  Pt with end stage renal disease. Pt to ED via EMS from [facility] c/o dialysis catheter having been pulled out approximately 5-6 inches. Pt is verbally unresponsive except to basic commands. EXAM: CHEST  1 VIEW COMPARISON:  06/24/2015 FINDINGS: The tunneled left IJ hemodialysis catheter has its tips in the proximal right atrium. No pneumothorax. Confluent Opacities in the left mid and lower lung probably combination of effusion and adjacent parenchymal consolidation/ atelectasis. Right lung clear. Heart size difficult to assess due to adjacent opacities. No effusion. Atheromatous aorta. IMPRESSION: 1. Left IJ dialysis catheter projects in expected location. 2. Left lower lung consolidation/atelectasis with effusion. Electronically Signed   By: Corlis Leak M.D.   On: 07/22/2015 14:35   Dg Chest 2 View  08/04/2015  CLINICAL DATA:  Shortness of breath.  Dialysis patient. EXAM: CHEST  2 VIEW COMPARISON:  07/28/2015 FINDINGS: There has been interval placement of of dual lumen dialysis catheter from right internal jugular approach with tip overlying the expected location of distal superior vena cava. The cardiac silhouette is enlarged. Mediastinal contours appear intact. There is no evidence of pneumothorax. There is left pleural effusion with left lower lobe airspace consolidation versus atelectasis. Osseous structures are without acute abnormality. Soft tissues are grossly normal. IMPRESSION: Dual lumen dialysis catheter in satisfactory position radiographically. Enlarged cardiac silhouette. Left lower lobe airspace consolidation with small left pleural effusion. Electronically Signed   By: Ted Mcalpine M.D.   On: 08/04/2015 08:04   Dg Hip Port Unilat With Pelvis 1v Right  07/23/2015  CLINICAL DATA:  Pt is in renal stage failure. Unable to transport downstairs so to be done portable. Best images obtained due to condition. Previous MR right hip on 11-25. Pt is complaining of pain and is unable to move in any way. EXAM: DG HIP (WITH OR WITHOUT PELVIS) 1V PORT  RIGHT COMPARISON:  None. FINDINGS: Hips are located. No evidence of pelvic fracture or sacral fracture. Dedicated view of the RIGHT hip demonstrates no femoral neck fracture. IMPRESSION: No fracture or dislocation. Electronically Signed   By: Genevive Bi M.D.   On: 07/23/2015 14:45       Today   Subjective:   Sean Delgado  patient was started on Megace yesterday all his breakfast today. Otherwise he is stable plan for hemodialysis later today  Objective:   Blood pressure 112/52, pulse 108, temperature 98 F (36.7 C), temperature source Oral, resp. rate 22, height 6\' 1"  (1.854 m), weight 92 kg (202 lb 13.2 oz), SpO2 100 %.  .  Intake/Output Summary (Last 24 hours) at 08/04/15 0904 Last data filed at 08/03/15 1250  Gross per 24 hour  Intake      0 ml  Output    500 ml  Net   -500 ml    Exam VITAL SIGNS: Blood pressure 112/52, pulse 108, temperature 98 F (36.7 C), temperature source Oral, resp. rate 22, height 6\' 1"  (1.854 m), weight 92 kg (202 lb 13.2 oz), SpO2 100 %.  GENERAL:  82 y.o.-year-old patient lying in the bed with no acute distress.  EYES: Pupils equal, round, reactive to light and accommodation. No scleral icterus. Extraocular muscles intact.  HEENT: Head atraumatic, normocephalic. Oropharynx and nasopharynx clear.  NECK:  Supple, no jugular venous distention. No thyroid enlargement, no tenderness.  LUNGS: Normal breath sounds bilaterally, no wheezing, rales,rhonchi or crepitation. No use of  accessory muscles of respiration.  CARDIOVASCULAR Irregularly irregular rhythml. No murmurs, rubs, or gallops.  ABDOMEN: Soft, nontender, nondistended. Bowel sounds present. No organomegaly or mass.  EXTREMITIES: No pedal edema, cyanosis, or clubbing.  NEUROLOGIC: Cranial nerves II through XII are intact. Muscle strength 5/5 in all extremities. Sensation intact. Gait not checked.  PSYCHIATRIC: The patient is alert and oriented x 3.  SKIN: No obvious rash, lesion, or ulcer.    Data Review     CBC w Diff: Lab Results  Component Value Date   WBC 8.3 08/03/2015   HGB 8.2* 08/03/2015   HCT 27.1* 08/03/2015   PLT 339 08/03/2015   LYMPHOPCT 7 07/22/2015   MONOPCT 7 07/22/2015   EOSPCT 0 07/22/2015   BASOPCT 1 07/22/2015   CMP: Lab Results  Component Value Date   NA 142 08/03/2015   K 3.4* 08/03/2015   CL 103 08/03/2015   CO2 30 08/03/2015   BUN 30* 08/03/2015   CREATININE 5.61* 08/03/2015   PROT 8.2* 05/30/2015   ALBUMIN 1.7* 08/03/2015   BILITOT 1.1 05/30/2015   ALKPHOS 99 05/30/2015   AST 27 05/30/2015   ALT 18 05/30/2015  .  Micro Results Recent Results (from the past 240 hour(s))  Cath Tip Culture     Status: None   Collection Time: 07/28/15  7:32 PM  Result Value Ref Range Status   Specimen Description CATH TIP  Final   Special Requests NONE  Final   Culture NO GROWTH 3 DAYS  Final   Report Status 07/31/2015 FINAL  Final  C difficile quick scan w PCR reflex     Status: Abnormal   Collection Time: 07/31/15 10:28 AM  Result Value Ref Range Status   C Diff antigen POSITIVE (A) NEGATIVE Final   C Diff toxin NEGATIVE NEGATIVE Final   C Diff interpretation   Final    Positive for toxigenic C. difficile, active toxin production not detected. Patient has toxigenic C. difficile organisms present in the bowel, but toxin was not detected. The patient may be a carrier or the level of toxin in the sample was below the limit  of detection. This information should be used in conjunction with the patient's clinical history when deciding on possible therapy.   Clostridium Difficile by PCR     Status: Abnormal   Collection Time: 07/31/15 10:28 AM  Result Value Ref Range Status   Toxigenic C Difficile by pcr POSITIVE (A) NEGATIVE Final    Comment: CRITICAL RESULT CALLED TO, READ BACK BY AND VERIFIED WITH: TAYLOR BECK 07/31/15 AT 1211 VKB         Code Status Orders        Start     Ordered   07/22/15 2038  Full code   Continuous      07/22/15 2038    Code Status History    Date Active Date Inactive Code Status Order ID Comments User Context   06/24/2015  4:07 PM 06/27/2015 12:37 AM Full Code [id number]  Auburn Bilberry, MD Inpatient   05/30/2015  5:22 PM 06/05/2015  6:42 PM Full Code [id number]  Trudee Kuster, RN Inpatient   05/30/2015  1:59 PM 05/30/2015  5:22 PM DNR [id number]  Shaune Pollack, MD Inpatient   05/29/2015  3:57 PM 05/29/2015  7:32 PM Full Code [id number]  Annice Needy, MD Inpatient   04/08/2015  8:31 PM 04/17/2015 11:42 PM Full Code [id number]  Gracelyn Nurse, MD Inpatient   03/04/2015  4:13 PM 03/11/2015  7:00 PM DNR [id number]  Houston Siren, MD Inpatient          Follow-up Information    Follow up with Bobbye Morton, MD In 2 weeks.   Specialty:  Family Medicine   Why:  Office stated they would put him on the list to be seen. ([facility])   Contact information:   [facility] [address], [place] [address] [phone]       Follow up with LATEEF, MUNSOOR, MD In 1 week.   Specialty:  Internal Medicine   Contact information:   [address] [place] [address] [phone]       Discharge Medications     Medication List    STOP taking these medications        HYDROcodone-acetaminophen 5-325 MG tablet  Commonly known as:  NORCO     senna 8.6 MG Tabs tablet  Commonly known as:  SENOKOT      TAKE these medications        acetaminophen 325 MG tablet  Commonly known as:  TYLENOL  Take 650 mg by mouth every 6 (six) hours as needed for mild pain or moderate pain. *note dose*     allopurinol 100 MG tablet  Commonly known as:  ZYLOPRIM  Take 100 mg by mouth daily.     aspirin EC 81 MG tablet  Take 81 mg by mouth daily. Reported on 06/23/2015     atorvastatin 20 MG tablet  Commonly known as:  LIPITOR  Take 1 tablet (20 mg total) by mouth daily.     b complex-vitamin c-folic acid 0.8 MG Tabs tablet  Take 1 tablet by mouth daily.     benzonatate  100 MG capsule  Commonly known as:  TESSALON  Take 1 capsule (100 mg total) by mouth 3 (three) times daily as needed for cough.     brimonidine 0.2 % ophthalmic solution  Commonly known as:  ALPHAGAN  Place 1 drop into the left eye 2 (two) times daily.     clopidogrel 75 MG tablet  Commonly known as:  PLAVIX  Take 75 mg by mouth daily.     diltiazem 180 MG 24 hr capsule  Commonly known as:  CARDIZEM CD  Take 1 capsule (180 mg total) by mouth daily.     ferrous sulfate 325 (65 FE) MG EC tablet  Take 325 mg by mouth daily.     furosemide 40 MG tablet  Commonly known as:  LASIX  Take 1 tablet (40 mg total) by mouth 2 (two) times daily.     isosorbide mononitrate 60 MG 24 hr tablet  Commonly known as:  IMDUR  Take 60 mg by mouth daily.     latanoprost 0.005 % ophthalmic solution  Commonly known as:  XALATAN  Place 1 drop into both eyes at bedtime.     lidocaine 5 %  Commonly known as:  LIDODERM  Place 1 patch onto the skin daily. Apply to right knee and right buttock at 6am. Remove & Discard patch within 12 hours or as directed by MD     megestrol 400 MG/10ML suspension  Commonly known as:  MEGACE  Take 10 mLs (400 mg total) by mouth 2 (two) times daily.     metoprolol 200 MG 24 hr tablet  Commonly known as:  TOPROL-XL  Take 200 mg by mouth daily.     metroNIDAZOLE 500 MG tablet  Commonly known as:  FLAGYL  Take 1 tablet (500 mg total) by mouth every 8 (eight) hours.     mupirocin ointment 2 %  Commonly known as:  BACTROBAN  Place 1 application into the nose 2 (two) times daily.     pregabalin 75 MG capsule  Commonly known as:  LYRICA  Take 75 mg by mouth daily.     sodium bicarbonate 650 MG tablet  Take 650 mg by mouth 2 (two) times daily.     tolnaftate 1 % powder  Commonly known as:  TINACTIN  Apply 1 application topically daily. Apply on both feet.     Vancomycin 750 MG/150ML Soln  Commonly known as:  VANCOCIN  Inject 150 mLs (750 mg total) into the  vein Every Tuesday,Thursday,and Saturday with dialysis.           Total Time in preparing paper work, data evaluation and todays exam - 35 minutes  Auburn Bilberry M.D on 08/04/2015 at 9:04 AM  [facility]   Office  [phone]

## 2015-08-04 NOTE — Progress Notes (Signed)
Called patients daughter Sunny Schlein however patient not available, left message with other family to make aware patient is being discharged today to [facility]

## 2015-08-04 NOTE — Discharge Instructions (Signed)
°  DIET:  Dysphagia 1 diet with honey thick liquid diet  DISCHARGE CONDITION:  Stable  ACTIVITY:  Activity as tolerated  OXYGEN:  Home Oxygen: Yes.     Oxygen Delivery: 2 liters/min via Patient connected to nasal cannula oxygen  DISCHARGE LOCATION:  nursing home    ADDITIONAL DISCHARGE INSTRUCTION:hd as per nephrology   If you experience worsening of your admission symptoms, develop shortness of breath, life threatening emergency, suicidal or homicidal thoughts you must seek medical attention immediately by calling 911 or calling your MD immediately  if symptoms less severe.  You Must read complete instructions/literature along with all the possible adverse reactions/side effects for all the Medicines you take and that have been prescribed to you. Take any new Medicines after you have completely understood and accpet all the possible adverse reactions/side effects.   Please note  You were cared for by a hospitalist during your hospital stay. If you have any questions about your discharge medications or the care you received while you were in the hospital after you are discharged, you can call the unit and asked to speak with the hospitalist on call if the hospitalist that took care of you is not available. Once you are discharged, your primary care physician will handle any further medical issues. Please note that NO REFILLS for any discharge medications will be authorized once you are discharged, as it is imperative that you return to your primary care physician (or establish a relationship with a primary care physician if you do not have one) for your aftercare needs so that they can reassess your need for medications and monitor your lab values.

## 2015-08-04 NOTE — Progress Notes (Signed)
Patient taken off the floor via wheelchair by staff. Patient discharged on 02 at 3L packet given to [facility] staff. Patient discharged to [facility].

## 2015-08-08 ENCOUNTER — Encounter: Payer: Self-pay | Admitting: *Deleted

## 2015-08-08 ENCOUNTER — Inpatient Hospital Stay
Admission: EM | Admit: 2015-08-08 | Discharge: 2015-08-14 | DRG: 871 | Disposition: A | Discharge status: Skilled Nursing Facility | Payer: Medicare (Managed Care) | Attending: Internal Medicine | Admitting: Internal Medicine

## 2015-08-08 ENCOUNTER — Emergency Department: Payer: Medicare (Managed Care)

## 2015-08-08 DIAGNOSIS — M109 Gout, unspecified: Secondary | ICD-10-CM | POA: Diagnosis present

## 2015-08-08 DIAGNOSIS — J9601 Acute respiratory failure with hypoxia: Secondary | ICD-10-CM | POA: Diagnosis present

## 2015-08-08 DIAGNOSIS — Z992 Dependence on renal dialysis: Secondary | ICD-10-CM

## 2015-08-08 DIAGNOSIS — E785 Hyperlipidemia, unspecified: Secondary | ICD-10-CM | POA: Diagnosis present

## 2015-08-08 DIAGNOSIS — A419 Sepsis, unspecified organism: Secondary | ICD-10-CM | POA: Diagnosis not present

## 2015-08-08 DIAGNOSIS — E872 Acidosis: Secondary | ICD-10-CM | POA: Diagnosis present

## 2015-08-08 DIAGNOSIS — Z79818 Long term (current) use of other agents affecting estrogen receptors and estrogen levels: Secondary | ICD-10-CM

## 2015-08-08 DIAGNOSIS — N2581 Secondary hyperparathyroidism of renal origin: Secondary | ICD-10-CM | POA: Diagnosis present

## 2015-08-08 DIAGNOSIS — A047 Enterocolitis due to Clostridium difficile: Secondary | ICD-10-CM | POA: Diagnosis present

## 2015-08-08 DIAGNOSIS — R6521 Severe sepsis with septic shock: Secondary | ICD-10-CM | POA: Diagnosis present

## 2015-08-08 DIAGNOSIS — H540 Blindness, both eyes: Secondary | ICD-10-CM | POA: Diagnosis present

## 2015-08-08 DIAGNOSIS — Z87891 Personal history of nicotine dependence: Secondary | ICD-10-CM

## 2015-08-08 DIAGNOSIS — N186 End stage renal disease: Secondary | ICD-10-CM | POA: Diagnosis present

## 2015-08-08 DIAGNOSIS — I132 Hypertensive heart and chronic kidney disease with heart failure and with stage 5 chronic kidney disease, or end stage renal disease: Secondary | ICD-10-CM | POA: Diagnosis present

## 2015-08-08 DIAGNOSIS — Z79899 Other long term (current) drug therapy: Secondary | ICD-10-CM

## 2015-08-08 DIAGNOSIS — Y838 Other surgical procedures as the cause of abnormal reaction of the patient, or of later complication, without mention of misadventure at the time of the procedure: Secondary | ICD-10-CM | POA: Diagnosis present

## 2015-08-08 DIAGNOSIS — Z7982 Long term (current) use of aspirin: Secondary | ICD-10-CM

## 2015-08-08 DIAGNOSIS — I2510 Atherosclerotic heart disease of native coronary artery without angina pectoris: Secondary | ICD-10-CM | POA: Diagnosis present

## 2015-08-08 DIAGNOSIS — T8249XA Other complication of vascular dialysis catheter, initial encounter: Secondary | ICD-10-CM | POA: Diagnosis present

## 2015-08-08 DIAGNOSIS — G629 Polyneuropathy, unspecified: Secondary | ICD-10-CM | POA: Diagnosis present

## 2015-08-08 DIAGNOSIS — I739 Peripheral vascular disease, unspecified: Secondary | ICD-10-CM | POA: Diagnosis present

## 2015-08-08 DIAGNOSIS — J189 Pneumonia, unspecified organism: Secondary | ICD-10-CM | POA: Diagnosis not present

## 2015-08-08 DIAGNOSIS — I5020 Unspecified systolic (congestive) heart failure: Secondary | ICD-10-CM | POA: Diagnosis present

## 2015-08-08 DIAGNOSIS — Z7902 Long term (current) use of antithrombotics/antiplatelets: Secondary | ICD-10-CM

## 2015-08-08 DIAGNOSIS — D631 Anemia in chronic kidney disease: Secondary | ICD-10-CM | POA: Diagnosis present

## 2015-08-08 DIAGNOSIS — Z8673 Personal history of transient ischemic attack (TIA), and cerebral infarction without residual deficits: Secondary | ICD-10-CM

## 2015-08-08 DIAGNOSIS — R6520 Severe sepsis without septic shock: Secondary | ICD-10-CM

## 2015-08-08 DIAGNOSIS — H409 Unspecified glaucoma: Secondary | ICD-10-CM | POA: Diagnosis present

## 2015-08-08 DIAGNOSIS — R0902 Hypoxemia: Secondary | ICD-10-CM

## 2015-08-08 DIAGNOSIS — I4891 Unspecified atrial fibrillation: Secondary | ICD-10-CM | POA: Diagnosis present

## 2015-08-08 LAB — CBC WITH DIFFERENTIAL/PLATELET
BASOS ABS: 0.1 10*3/uL (ref 0–0.1)
Basophils Relative: 1 %
EOS ABS: 0.2 10*3/uL (ref 0–0.7)
Eosinophils Relative: 1 %
HCT: 30.4 % — ABNORMAL LOW (ref 40.0–52.0)
HEMOGLOBIN: 9.1 g/dL — AB (ref 13.0–18.0)
LYMPHS ABS: 1.4 10*3/uL (ref 1.0–3.6)
MCH: 25.6 pg — AB (ref 26.0–34.0)
MCHC: 29.8 g/dL — ABNORMAL LOW (ref 32.0–36.0)
MCV: 86 fL (ref 80.0–100.0)
Monocytes Absolute: 0.9 10*3/uL (ref 0.2–1.0)
Monocytes Relative: 7 %
Neutro Abs: 9.8 10*3/uL — ABNORMAL HIGH (ref 1.4–6.5)
Platelets: 293 10*3/uL (ref 150–440)
RBC: 3.54 MIL/uL — AB (ref 4.40–5.90)
RDW: 23 % — ABNORMAL HIGH (ref 11.5–14.5)
WBC: 12.3 10*3/uL — AB (ref 3.8–10.6)

## 2015-08-08 LAB — COMPREHENSIVE METABOLIC PANEL
ALT: 8 U/L — ABNORMAL LOW (ref 17–63)
ANION GAP: 10 (ref 5–15)
AST: 15 U/L (ref 15–41)
Albumin: 2.1 g/dL — ABNORMAL LOW (ref 3.5–5.0)
Alkaline Phosphatase: 68 U/L (ref 38–126)
BUN: 12 mg/dL (ref 6–20)
CHLORIDE: 97 mmol/L — AB (ref 101–111)
CO2: 33 mmol/L — AB (ref 22–32)
Calcium: 7.5 mg/dL — ABNORMAL LOW (ref 8.9–10.3)
Creatinine, Ser: 3.08 mg/dL — ABNORMAL HIGH (ref 0.61–1.24)
GFR calc non Af Amer: 18 mL/min — ABNORMAL LOW (ref >60)
GFR, EST AFRICAN AMERICAN: 21 mL/min — AB (ref >60)
Glucose, Bld: 104 mg/dL — ABNORMAL HIGH (ref 65–99)
POTASSIUM: 3.1 mmol/L — AB (ref 3.5–5.1)
SODIUM: 140 mmol/L (ref 135–145)
Total Bilirubin: 0.6 mg/dL (ref 0.3–1.2)
Total Protein: 7.2 g/dL (ref 6.5–8.1)

## 2015-08-08 LAB — BLOOD GAS, VENOUS
Acid-Base Excess: 9.1 mmol/L — ABNORMAL HIGH (ref 0.0–3.0)
Bicarbonate: 36.3 mEq/L — ABNORMAL HIGH (ref 21.0–28.0)
FIO2: 1
PATIENT TEMPERATURE: 37
PCO2 VEN: 60 mmHg (ref 44.0–60.0)
pH, Ven: 7.39 (ref 7.320–7.430)
pO2, Ven: <31 mmHg (ref 30.0–45.0)

## 2015-08-08 LAB — TROPONIN I: Troponin I: 0.05 ng/mL — ABNORMAL HIGH (ref <0.031)

## 2015-08-08 LAB — PROTIME-INR
INR: 1.26
PROTHROMBIN TIME: 15.9 s — AB (ref 11.4–15.0)

## 2015-08-08 LAB — LIPASE, BLOOD: LIPASE: 24 U/L (ref 11–51)

## 2015-08-08 LAB — LACTIC ACID, PLASMA: Lactic Acid, Venous: 1.6 mmol/L (ref 0.5–2.0)

## 2015-08-08 MED ORDER — VANCOMYCIN HCL IN DEXTROSE 1-5 GM/200ML-% IV SOLN
1000.0000 mg | Freq: Once | INTRAVENOUS | Status: AC
  Administered 2015-08-08: 1000 mg via INTRAVENOUS

## 2015-08-08 MED ORDER — DEXTROSE 5 % IV SOLN
2.0000 g | Freq: Once | INTRAVENOUS | Status: AC
  Administered 2015-08-09: 2 g via INTRAVENOUS
  Filled 2015-08-08: qty 2

## 2015-08-08 MED ORDER — VANCOMYCIN HCL IN DEXTROSE 1-5 GM/200ML-% IV SOLN
1000.0000 mg | Freq: Once | INTRAVENOUS | Status: DC
  Filled 2015-08-08: qty 200

## 2015-08-08 MED ORDER — SODIUM CHLORIDE 0.9 % IV BOLUS (SEPSIS)
1000.0000 mL | INTRAVENOUS | Status: DC
  Administered 2015-08-08: 1000 mL via INTRAVENOUS

## 2015-08-08 NOTE — ED Provider Notes (Signed)
[facility] Emergency Department Provider Note  ____________________________________________  Time seen: Approximately 9:57 PM  I have reviewed the triage vital signs and the nursing notes.   HISTORY  Chief Complaint Shortness of Breath  shortness of breath.   HPI Sean Delgado. is a 82 y.o. male presents for evaluation of dyspnea. Reportedly hypoxic without oxygen level in the 60s on room air at the nursing facility. EMS report placed on nonrebreather and satting in the mid 90s. End-tidal CO2 was noted by EMS to be in the mid 20s, concerning for sepsis.  Patient recently admitted. He states that he is not in any pain. He feels tired but otherwise okay. He denies shortness of breath at this time, but did feel short of breath before the paramedics arrived. He also reports that he has chronic blindness and weakness. He believes but is not quite sure if he may have had dialysis today.    Past Medical History  Diagnosis Date  . ESRD (end stage renal disease) (HCC)   . Hypertension   . CHF (congestive heart failure) (HCC)   . Gout   . Glaucoma   . Neuropathy (HCC)   . Stroke (HCC)   . Peripheral vascular disease (HCC)   . Coronary artery disease   . A-fib (HCC)   . Dysrhythmia   . Blindness of right eye   . Anemia     iron deficiency    Patient Active Problem List   Diagnosis Date Noted  . Aspiration pneumonia of both lungs (HCC)   . Malnutrition of moderate degree 07/25/2015  . Aspiration pneumonia (HCC) 07/22/2015  . Hemodialysis catheter dysfunction (HCC) 06/24/2015  . Pressure ulcer 05/31/2015  . Sepsis (HCC) 05/30/2015  . Hypoxia 05/30/2015  . Acute respiratory failure (HCC) 04/08/2015  . CHF (congestive heart failure) (HCC) 03/04/2015    Past Surgical History  Procedure Laterality Date  . Vascular surgery Bilateral     Carotid Endarterectomy  . Eye surgery Left     Cataract Extraction  . Av fistula placement Left 04/06/2015     Procedure: ARTERIOVENOUS (AV) FISTULA CREATION;  Surgeon: Annice Needy, MD;  Location: [facility] ORS;  Service: Vascular;  Laterality: Left;  . Peripheral vascular catheterization N/A 04/12/2015    Procedure: Dialysis/Perma Catheter Insertion;  Surgeon: Annice Needy, MD;  Location: [facility] INVASIVE CV LAB;  Service: Cardiovascular;  Laterality: N/A;  . Peripheral vascular catheterization N/A 05/18/2015    Procedure: Dialysis/Perma Catheter Insertion;  Surgeon: Annice Needy, MD;  Location: [facility] INVASIVE CV LAB;  Service: Cardiovascular;  Laterality: N/A;  . Peripheral vascular catheterization Left 05/29/2015    Procedure: A/V Shuntogram/Fistulagram;  Surgeon: Annice Needy, MD;  Location: [facility] INVASIVE CV LAB;  Service: Cardiovascular;  Laterality: Left;  . Peripheral vascular catheterization N/A 05/29/2015    Procedure: A/V Shunt Intervention;  Surgeon: Annice Needy, MD;  Location: [facility] INVAS[facility] CV LAB;  Service: Cardiovascular;  Laterality: N/A;  . Peripheral vascular catheterization N/A 06/26/2015    Procedure: Dialysis/Perma Catheter Insertion;  Surgeon: Annice Needy, MD;  Location: [facility] INVAS[facility] CV LAB;  Service: Cardiovascular;  Laterality: N/A;  . Av fistula placement Right 06/29/2015    Procedure: BRACHIAL CEPHALIC AV FISTULA;  Surgeon: Annice Needy, MD;  Location: [facility] ORS;  Service: Vascular;  Laterality: Right;  . Peripheral vascular catheterization N/A 08/02/2015    Procedure: Dialysis/Perma Catheter Insertion;  Surgeon: Renford Dills, MD;  Location: [facility] INVASIVE CV LAB;  Service: Cardiovascular;  Laterality:  N/A;    Current Outpatient Rx  Name  Route  Sig  Dispense  Refill  . acetaminophen (TYLENOL) 325 MG tablet   Oral   Take 650 mg by mouth every 6 (six) hours as needed for mild pain or moderate pain. *note dose*         . allopurinol (ZYLOPRIM) 100 MG tablet   Oral   Take 100 mg by mouth daily.         Marland Kitchen aspirin EC 81 MG tablet   Oral   Take 81 mg by mouth daily. Reported on  06/23/2015         . atorvastatin (LIPITOR) 20 MG tablet   Oral   Take 1 tablet (20 mg total) by mouth daily. Patient taking differently: Take 20 mg by mouth at bedtime.    30 tablet   0   . b complex-vitamin c-folic acid (NEPHRO-VITE) 0.8 MG TABS tablet   Oral   Take 1 tablet by mouth daily.         . benzonatate (TESSALON) 100 MG capsule   Oral   Take 1 capsule (100 mg total) by mouth 3 (three) times daily as needed for cough.   20 capsule   0   . brimonidine (ALPHAGAN) 0.2 % ophthalmic solution   Left Eye   Place 1 drop into the left eye 2 (two) times daily.         . clopidogrel (PLAVIX) 75 MG tablet   Oral   Take 75 mg by mouth daily.         Marland Kitchen diltiazem (CARDIZEM CD) 180 MG 24 hr capsule   Oral   Take 1 capsule (180 mg total) by mouth daily.   30 capsule   0   . ferrous sulfate 325 (65 FE) MG EC tablet   Oral   Take 325 mg by mouth daily.         . furosemide (LASIX) 40 MG tablet   Oral   Take 1 tablet (40 mg total) by mouth 2 (two) times daily.   30 tablet   0   . isosorbide mononitrate (IMDUR) 60 MG 24 hr tablet   Oral   Take 60 mg by mouth daily.         Marland Kitchen latanoprost (XALATAN) 0.005 % ophthalmic solution   Both Eyes   Place 1 drop into both eyes at bedtime.         . lidocaine (LIDODERM) 5 %   Transdermal   Place 1 patch onto the skin daily. Apply to right knee and right buttock at 6am. Remove & Discard patch within 12 hours or as directed by MD         . megestrol (MEGACE) 400 MG/10ML suspension   Oral   Take 10 mLs (400 mg total) by mouth 2 (two) times daily.   240 mL   0   . metoprolol (TOPROL-XL) 200 MG 24 hr tablet   Oral   Take 200 mg by mouth daily.         . metroNIDAZOLE (FLAGYL) 500 MG tablet   Oral   Take 1 tablet (500 mg total) by mouth every 8 (eight) hours.         . pregabalin (LYRICA) 75 MG capsule   Oral   Take 75 mg by mouth daily.          . sodium bicarbonate 650 MG tablet   Oral   Take 650  mg by mouth  2 (two) times daily.         Marland Kitchen tolnaftate (TINACTIN) 1 % powder   Topical   Apply 1 application topically daily. Apply on both feet.         . Vancomycin (VANCOCIN) 750 MG/150ML SOLN   Intravenous   Inject 150 mLs (750 mg total) into the vein Every Tuesday,Thursday,and Saturday with dialysis.   4000 mL        Allergies Review of patient's allergies indicates no known allergies.  Family History  Problem Relation Age of Onset  . Diabetes Mother     Social History Social History  Substance Use Topics  . Smoking status: Former Smoker -- 0.25 packs/day for 15 years    Types: Cigarettes    Quit date: 09/06/2006  . Smokeless tobacco: Never Used  . Alcohol Use: No    Review of Systems Constitutional: No fever/chills Eyes: No visual changes. Blind in left eye baseline. ENT: No sore throat. Cardiovascular: Denies chest pain. Respiratory: Denies shortness of breath presently, but was short of breath before going on oxygen. Gastrointestinal: No abdominal pain.  No nausea, no vomiting.  No diarrhea.  No constipation. Genitourinary: Negative for dysuria. Musculoskeletal: Negative for back pain. Skin: Negative for rash. Neurological: Negative for headaches, focal weakness or numbness. Reports he is always tired.  10-point ROS otherwise negative.  ____________________________________________   PHYSICAL EXAM:  VITAL SIGNS: ED Triage Vitals  Enc Vitals Group     BP --      Pulse --      Resp --      Temp --      Temp src --      SpO2 --      Weight --      Height --      Head Cir --      Peak Flow --      Pain Score --      Pain Loc --      Pain Edu? --      Excl. in GC? --    Constitutional: Alert and oriented. Somnolent, in no obvious distress does appear very weak generally. Eyes: Conjunctivae are normal. PERRL. EOMI. Head: Atraumatic. Nose: No congestion/rhinnorhea. Mouth/Throat: Mucous membranes are moist.  Oropharynx  non-erythematous. Neck: No stridor.   Cardiovascular: Normal rate, regular rhythm. Grossly normal heart sounds.  Good peripheral circulation. Respiratory: Normal respiratory effort.  No retractions. Lungs CTAB. Gastrointestinal: Soft and nontender. No distention. No abdominal bruits. No CVA tenderness. Musculoskeletal: No lower extremity tenderness nor edema.  No joint effusions. Neurologic:  Normal speech and language. No gross focal neurologic deficits are appreciated. No gait instability. Skin:  Skin is warm, dry and intact. No rash noted. Psychiatric: Mood and affect are normal. Speech and behavior are normal.  ____________________________________________   LABS (all labs ordered are listed, but only abnormal results are displayed)  Labs Reviewed  COMPREHENSIVE METABOLIC PANEL - Abnormal; Notable for the following:    Potassium 3.1 (*)    Chloride 97 (*)    CO2 33 (*)    Glucose, Bld 104 (*)    Creatinine, Ser 3.08 (*)    Calcium 7.5 (*)    Albumin 2.1 (*)    ALT 8 (*)    GFR calc non Af Amer 18 (*)    GFR calc Af Amer 21 (*)    All other components within normal limits  TROPONIN I - Abnormal; Notable for the following:    Troponin I 0.05 (*)  All other components within normal limits  CBC WITH DIFFERENTIAL/PLATELET - Abnormal; Notable for the following:    WBC 12.3 (*)    RBC 3.54 (*)    Hemoglobin 9.1 (*)    HCT 30.4 (*)    MCH 25.6 (*)    MCHC 29.8 (*)    RDW 23.0 (*)    Neutro Abs 9.8 (*)    All other components within normal limits  PROTIME-INR - Abnormal; Notable for the following:    Prothrombin Time 15.9 (*)    All other components within normal limits  BLOOD GAS, VENOUS - Abnormal; Notable for the following:    Bicarbonate 36.3 (*)    Acid-Base Excess 9.1 (*)    All other components within normal limits  CULTURE, BLOOD (ROUTINE X 2)  CULTURE, BLOOD (ROUTINE X 2)  URINE CULTURE  LIPASE, BLOOD  LACTIC ACID, PLASMA  LACTIC ACID, PLASMA  URINALYSIS  COMPLETEWITH MICROSCOPIC ([facility] ONLY)   ____________________________________________  EKG  Reviewed and interpreted by me at 2205 Atrial fibrillation, heart rate 90 QTc 420 Poor baseline, no clear obvious ischemic abnormality. Nonspecific changes including flattening seen in multiple leads. ____________________________________________  RADIOLOGY  DG Chest Port 1 View (Final result) Result time: 08/08/15 22:19:51   Final result by Rad Results In Interface (08/08/15 22:19:51)   Narrative:   CLINICAL DATA: Sepsis  EXAM: PORTABLE CHEST 1 VIEW  COMPARISON: 08/04/2015  FINDINGS: Dialysis catheter on the right tip at the upper right atrium.  There is bibasilar airspace disease, most dense in the right middle lobe. No effusion or cavitation. No pulmonary edema. Cardiomegaly.  IMPRESSION: Bibasilar airspace disease consistent with pneumonia given the history.   Electronically Signed By: Marnee Spring M.D. On: 08/08/2015 22:19    ____________________________________________   PROCEDURES  Procedure(s) performed: None  Critical Care performed: Yes, see critical care note(s)  CRITICAL CARE Performed by: Sharyn Creamer   Total critical care time: 45 minutes  Critical care time was exclusive of separately billable procedures and treating other patients.  Critical care was necessary to treat or prevent imminent or life-threatening deterioration.  Critical care was time spent personally by me on the following activities: development of treatment plan with patient and/or surrogate as well as nursing, discussions with consultants, evaluation of patient's response to treatment, examination of patient, obtaining history from patient or surrogate, ordering and performing treatments and interventions, ordering and review of laboratory studies, ordering and review of radiographic studies, pulse oximetry and re-evaluation of patient's  condition.  ----------------------------------------- 10:45 PM on 08/08/2015 -----------------------------------------  Patient more alert. Denies pain or distress. Blood pressure improved now 96 systolic. Heart rate 60s. Initial labs reviewed note for increased leukocytosis from previous draw with significant left shift. At this point, I suspect the patient is likely ongoing sepsis he is extremely high risk for bacteremia including MRSA. The patient has a dialysis access. Continue to treat for suspected sepsis. We'll await remaining labs, continue to follow closely. Chest x-ray appears consistent with pneumonia. ____________________________________________  ----------------------------------------- 11:23 PM on 08/08/2015 -----------------------------------------  ED Sepsis - Repeat Assessment   Performed at:    11:20 PM  Last Vitals:    Blood pressure 97/47, pulse 93, temperature 98.8 F (37.1 C), temperature source Rectal, resp. rate 26, SpO2 100 %.  Heart:      Irregularly irregular  Lungs:     Diminished bases bilateral  Capillary Refill:   Normal  Peripheral Pulse (include location): Normal, strong radial   Skin (include color):   Normal  tone   INITIAL IMPRESSION / ASSESSMENT AND PLAN / ED COURSE  Pertinent labs & imaging results that were available during my care of the patient were reviewed by me and considered in my medical decision making (see chart for details).   ____________________________________________   FINAL CLINICAL IMPRESSION(S) / ED DIAGNOSES  Final diagnoses:  Severe sepsis (HCC)  HCAP (healthcare-associated pneumonia)      Sharyn Creamer, MD 08/08/15 2325

## 2015-08-08 NOTE — ED Notes (Signed)
[phone] Phone number for [facility]. Dr. [name] requests updates and requests MD call with any questions

## 2015-08-08 NOTE — Progress Notes (Signed)
Pharmacy Antibiotic Note  Sean Delgado. is a 82 y.o. male admitted on 08/08/2015 with pneumonia.  Pharmacy has been consulted for cefepime and vancomycin dosing.  Plan: Cefepime 2 gm IV Q48H (after each dialysis) and vancomycin 1 gm IV Q48H (after each dialysis). Pharmacy will continue to follow and adjust dosing times based on dialysis sessions.      No data recorded.   Recent Labs Lab 08/03/15 0940  WBC 8.3  CREATININE 5.61*    Estimated Creatinine Clearance: 12.1 mL/min (by C-G formula based on Cr of 5.61).    No Known Allergies   Thank you for allowing pharmacy to be a part of this patient's care.  Carola Frost, Pharm.D., BCPS Clinical Pharmacist 08/08/2015 10:18 PM

## 2015-08-08 NOTE — ED Notes (Signed)
RT picked up venous blood gas 

## 2015-08-08 NOTE — ED Notes (Signed)
Pt arrived to ED from after reportedly stating 60% on 4L. Pt was recently discharged yesterday from [facility] for pneumonia. Pt continues to cough at this time and reports feeling SOB. Pts eyes are closed upon arrival and when asked pt reports he feels "very drained."

## 2015-08-09 DIAGNOSIS — N186 End stage renal disease: Secondary | ICD-10-CM | POA: Diagnosis present

## 2015-08-09 DIAGNOSIS — Z7982 Long term (current) use of aspirin: Secondary | ICD-10-CM | POA: Diagnosis not present

## 2015-08-09 DIAGNOSIS — I4891 Unspecified atrial fibrillation: Secondary | ICD-10-CM | POA: Diagnosis present

## 2015-08-09 DIAGNOSIS — Z7902 Long term (current) use of antithrombotics/antiplatelets: Secondary | ICD-10-CM | POA: Diagnosis not present

## 2015-08-09 DIAGNOSIS — Z87891 Personal history of nicotine dependence: Secondary | ICD-10-CM | POA: Diagnosis not present

## 2015-08-09 DIAGNOSIS — E872 Acidosis: Secondary | ICD-10-CM | POA: Diagnosis present

## 2015-08-09 DIAGNOSIS — R6521 Severe sepsis with septic shock: Secondary | ICD-10-CM | POA: Diagnosis present

## 2015-08-09 DIAGNOSIS — M109 Gout, unspecified: Secondary | ICD-10-CM | POA: Diagnosis present

## 2015-08-09 DIAGNOSIS — Z8673 Personal history of transient ischemic attack (TIA), and cerebral infarction without residual deficits: Secondary | ICD-10-CM | POA: Diagnosis not present

## 2015-08-09 DIAGNOSIS — A047 Enterocolitis due to Clostridium difficile: Secondary | ICD-10-CM | POA: Diagnosis present

## 2015-08-09 DIAGNOSIS — I132 Hypertensive heart and chronic kidney disease with heart failure and with stage 5 chronic kidney disease, or end stage renal disease: Secondary | ICD-10-CM | POA: Diagnosis present

## 2015-08-09 DIAGNOSIS — G629 Polyneuropathy, unspecified: Secondary | ICD-10-CM | POA: Diagnosis present

## 2015-08-09 DIAGNOSIS — I2510 Atherosclerotic heart disease of native coronary artery without angina pectoris: Secondary | ICD-10-CM | POA: Diagnosis present

## 2015-08-09 DIAGNOSIS — H409 Unspecified glaucoma: Secondary | ICD-10-CM | POA: Diagnosis present

## 2015-08-09 DIAGNOSIS — J9601 Acute respiratory failure with hypoxia: Secondary | ICD-10-CM | POA: Diagnosis present

## 2015-08-09 DIAGNOSIS — N2581 Secondary hyperparathyroidism of renal origin: Secondary | ICD-10-CM | POA: Diagnosis present

## 2015-08-09 DIAGNOSIS — D631 Anemia in chronic kidney disease: Secondary | ICD-10-CM | POA: Diagnosis present

## 2015-08-09 DIAGNOSIS — A419 Sepsis, unspecified organism: Secondary | ICD-10-CM | POA: Diagnosis not present

## 2015-08-09 DIAGNOSIS — Y838 Other surgical procedures as the cause of abnormal reaction of the patient, or of later complication, without mention of misadventure at the time of the procedure: Secondary | ICD-10-CM | POA: Diagnosis present

## 2015-08-09 DIAGNOSIS — J690 Pneumonitis due to inhalation of food and vomit: Secondary | ICD-10-CM | POA: Diagnosis not present

## 2015-08-09 DIAGNOSIS — I739 Peripheral vascular disease, unspecified: Secondary | ICD-10-CM | POA: Diagnosis present

## 2015-08-09 DIAGNOSIS — I5020 Unspecified systolic (congestive) heart failure: Secondary | ICD-10-CM | POA: Diagnosis present

## 2015-08-09 DIAGNOSIS — Z79899 Other long term (current) drug therapy: Secondary | ICD-10-CM | POA: Diagnosis not present

## 2015-08-09 DIAGNOSIS — J189 Pneumonia, unspecified organism: Secondary | ICD-10-CM | POA: Diagnosis not present

## 2015-08-09 DIAGNOSIS — Z79818 Long term (current) use of other agents affecting estrogen receptors and estrogen levels: Secondary | ICD-10-CM | POA: Diagnosis not present

## 2015-08-09 DIAGNOSIS — H540 Blindness, both eyes: Secondary | ICD-10-CM | POA: Diagnosis present

## 2015-08-09 DIAGNOSIS — Z992 Dependence on renal dialysis: Secondary | ICD-10-CM | POA: Diagnosis not present

## 2015-08-09 DIAGNOSIS — T8249XA Other complication of vascular dialysis catheter, initial encounter: Secondary | ICD-10-CM | POA: Diagnosis present

## 2015-08-09 DIAGNOSIS — E785 Hyperlipidemia, unspecified: Secondary | ICD-10-CM | POA: Diagnosis present

## 2015-08-09 LAB — BASIC METABOLIC PANEL
Anion gap: 10 (ref 5–15)
BUN: 15 mg/dL (ref 6–20)
CALCIUM: 7.4 mg/dL — AB (ref 8.9–10.3)
CHLORIDE: 101 mmol/L (ref 101–111)
CO2: 28 mmol/L (ref 22–32)
CREATININE: 3.45 mg/dL — AB (ref 0.61–1.24)
GFR calc non Af Amer: 16 mL/min — ABNORMAL LOW (ref >60)
GFR, EST AFRICAN AMERICAN: 18 mL/min — AB (ref >60)
Glucose, Bld: 94 mg/dL (ref 65–99)
Potassium: 3.2 mmol/L — ABNORMAL LOW (ref 3.5–5.1)
SODIUM: 139 mmol/L (ref 135–145)

## 2015-08-09 LAB — CBC
HCT: 33.5 % — ABNORMAL LOW (ref 40.0–52.0)
Hemoglobin: 9.9 g/dL — ABNORMAL LOW (ref 13.0–18.0)
MCH: 26 pg (ref 26.0–34.0)
MCHC: 29.4 g/dL — ABNORMAL LOW (ref 32.0–36.0)
MCV: 88.4 fL (ref 80.0–100.0)
PLATELETS: 260 10*3/uL (ref 150–440)
RBC: 3.79 MIL/uL — AB (ref 4.40–5.90)
RDW: 23.1 % — AB (ref 11.5–14.5)
WBC: 8.9 10*3/uL (ref 3.8–10.6)

## 2015-08-09 LAB — MAGNESIUM: MAGNESIUM: 1.7 mg/dL (ref 1.7–2.4)

## 2015-08-09 LAB — LACTIC ACID, PLASMA: Lactic Acid, Venous: 2.4 mmol/L (ref 0.5–2.0)

## 2015-08-09 LAB — HEMOGLOBIN A1C: Hgb A1c MFr Bld: 4.9 % (ref 4.0–6.0)

## 2015-08-09 LAB — GLUCOSE, CAPILLARY: Glucose-Capillary: 94 mg/dL (ref 65–99)

## 2015-08-09 LAB — PHOSPHORUS: PHOSPHORUS: 4.4 mg/dL (ref 2.5–4.6)

## 2015-08-09 LAB — TSH: TSH: 1.436 u[IU]/mL (ref 0.350–4.500)

## 2015-08-09 MED ORDER — ACETAMINOPHEN 325 MG PO TABS: 650.0000 mg | ORAL_TABLET | Freq: Four times a day (QID) | ORAL | Status: DC | PRN

## 2015-08-09 MED ORDER — LATANOPROST 0.005 % OP SOLN
1.0000 [drp] | Freq: Every day | OPHTHALMIC | Status: DC
Start: 2015-08-09 — End: 2015-08-15
  Administered 2015-08-09 – 2015-08-14 (×6): 1 [drp] via OPHTHALMIC
  Filled 2015-08-09: qty 2.5

## 2015-08-09 MED ORDER — DEXTROSE 5 % IV SOLN
2.0000 g | INTRAVENOUS | Status: DC
  Administered 2015-08-10: 2 g via INTRAVENOUS
  Filled 2015-08-09 (×2): qty 2

## 2015-08-09 MED ORDER — VANCOMYCIN HCL IN DEXTROSE 1-5 GM/200ML-% IV SOLN: 1000.0000 mg | INTRAVENOUS | Status: DC

## 2015-08-09 MED ORDER — TOLNAFTATE 1 % EX POWD
1.0000 "application " | Freq: Every day | CUTANEOUS | Status: DC
  Administered 2015-08-09 – 2015-08-13 (×5): 1 via TOPICAL
  Filled 2015-08-09: qty 45

## 2015-08-09 MED ORDER — DOCUSATE SODIUM 100 MG PO CAPS
100.0000 mg | ORAL_CAPSULE | Freq: Two times a day (BID) | ORAL | Status: DC
  Administered 2015-08-09 – 2015-08-14 (×12): 100 mg via ORAL
  Filled 2015-08-09 (×13): qty 1

## 2015-08-09 MED ORDER — CLOPIDOGREL BISULFATE 75 MG PO TABS
75.0000 mg | ORAL_TABLET | Freq: Every day | ORAL | Status: DC
  Administered 2015-08-09 – 2015-08-14 (×6): 75 mg via ORAL
  Filled 2015-08-09 (×6): qty 1

## 2015-08-09 MED ORDER — ALLOPURINOL 100 MG PO TABS
100.0000 mg | ORAL_TABLET | Freq: Every day | ORAL | Status: DC
  Administered 2015-08-09 – 2015-08-14 (×6): 100 mg via ORAL
  Filled 2015-08-09 (×6): qty 1

## 2015-08-09 MED ORDER — PNEUMOCOCCAL VAC POLYVALENT 25 MCG/0.5ML IJ INJ: 0.5000 mL | INJECTION | INTRAMUSCULAR | Status: DC

## 2015-08-09 MED ORDER — MEGESTROL ACETATE 400 MG/10ML PO SUSP
400.0000 mg | Freq: Two times a day (BID) | ORAL | Status: DC
  Administered 2015-08-09 – 2015-08-14 (×11): 400 mg via ORAL
  Filled 2015-08-09 (×12): qty 10

## 2015-08-09 MED ORDER — SODIUM CHLORIDE 0.9% FLUSH
3.0000 mL | Freq: Two times a day (BID) | INTRAVENOUS | Status: DC
  Administered 2015-08-09 – 2015-08-14 (×11): 3 mL via INTRAVENOUS

## 2015-08-09 MED ORDER — LIDOCAINE 5 % EX PTCH
1.0000 | MEDICATED_PATCH | CUTANEOUS | Status: DC
  Administered 2015-08-09 – 2015-08-14 (×5): 1 via TRANSDERMAL
  Filled 2015-08-09 (×7): qty 1

## 2015-08-09 MED ORDER — SODIUM CHLORIDE 0.9 % IV SOLN
Freq: Once | INTRAVENOUS | Status: AC
  Administered 2015-08-09: 01:00:00 via INTRAVENOUS

## 2015-08-09 MED ORDER — ISOSORBIDE MONONITRATE ER 30 MG PO TB24: 60.0000 mg | ORAL_TABLET | Freq: Every day | ORAL | Status: DC

## 2015-08-09 MED ORDER — VANCOMYCIN HCL IN DEXTROSE 1-5 GM/200ML-% IV SOLN
1000.0000 mg | Freq: Once | INTRAVENOUS | Status: AC
  Administered 2015-08-09: 1000 mg via INTRAVENOUS
  Filled 2015-08-09: qty 200

## 2015-08-09 MED ORDER — SODIUM BICARBONATE 650 MG PO TABS
650.0000 mg | ORAL_TABLET | Freq: Two times a day (BID) | ORAL | Status: DC
  Administered 2015-08-09 – 2015-08-10 (×3): 650 mg via ORAL
  Filled 2015-08-09 (×3): qty 1

## 2015-08-09 MED ORDER — SODIUM CHLORIDE 0.9 % IV BOLUS (SEPSIS)
250.0000 mL | Freq: Once | INTRAVENOUS | Status: AC
  Administered 2015-08-09: 250 mL via INTRAVENOUS

## 2015-08-09 MED ORDER — BENZONATATE 100 MG PO CAPS: 100.0000 mg | ORAL_CAPSULE | Freq: Three times a day (TID) | ORAL | Status: DC | PRN

## 2015-08-09 MED ORDER — NOREPINEPHRINE BITARTRATE 1 MG/ML IV SOLN
0.0000 ug/min | INTRAVENOUS | Status: DC
  Administered 2015-08-09: 2 ug/min via INTRAVENOUS
  Filled 2015-08-09 (×2): qty 4

## 2015-08-09 MED ORDER — PREGABALIN 75 MG PO CAPS
75.0000 mg | ORAL_CAPSULE | Freq: Every day | ORAL | Status: DC
  Administered 2015-08-09 – 2015-08-14 (×6): 75 mg via ORAL
  Filled 2015-08-09 (×6): qty 1

## 2015-08-09 MED ORDER — METOPROLOL SUCCINATE ER 50 MG PO TB24: 200.0000 mg | ORAL_TABLET | Freq: Every day | ORAL | Status: DC

## 2015-08-09 MED ORDER — ONDANSETRON HCL 4 MG/2ML IJ SOLN
4.0000 mg | Freq: Four times a day (QID) | INTRAMUSCULAR | Status: DC | PRN
  Filled 2015-08-09: qty 2

## 2015-08-09 MED ORDER — ASPIRIN EC 81 MG PO TBEC
81.0000 mg | DELAYED_RELEASE_TABLET | Freq: Every day | ORAL | Status: DC
  Administered 2015-08-09 – 2015-08-14 (×6): 81 mg via ORAL
  Filled 2015-08-09 (×6): qty 1

## 2015-08-09 MED ORDER — ONDANSETRON HCL 4 MG PO TABS: 4.0000 mg | ORAL_TABLET | Freq: Four times a day (QID) | ORAL | Status: DC | PRN

## 2015-08-09 MED ORDER — POTASSIUM CHLORIDE CRYS ER 20 MEQ PO TBCR
40.0000 meq | EXTENDED_RELEASE_TABLET | Freq: Once | ORAL | Status: AC
Start: 2015-08-09 — End: 2015-08-09
  Administered 2015-08-09: 40 meq via ORAL
  Filled 2015-08-09: qty 2

## 2015-08-09 MED ORDER — ACETAMINOPHEN 650 MG RE SUPP: 650.0000 mg | Freq: Four times a day (QID) | RECTAL | Status: DC | PRN

## 2015-08-09 MED ORDER — BRIMONIDINE TARTRATE 0.2 % OP SOLN
1.0000 [drp] | Freq: Two times a day (BID) | OPHTHALMIC | Status: DC
  Administered 2015-08-09 – 2015-08-14 (×12): 1 [drp] via OPHTHALMIC
  Filled 2015-08-09: qty 5

## 2015-08-09 MED ORDER — NOREPINEPHRINE BITARTRATE 1 MG/ML IV SOLN
0.0000 ug/min | INTRAVENOUS | Status: DC
  Administered 2015-08-09: 2 ug/min via INTRAVENOUS

## 2015-08-09 MED ORDER — SODIUM CHLORIDE 0.9 % IV SOLN
INTRAVENOUS | Status: AC
  Administered 2015-08-09 (×2): via INTRAVENOUS

## 2015-08-09 MED ORDER — MORPHINE SULFATE (PF) 2 MG/ML IV SOLN
1.0000 mg | INTRAVENOUS | Status: DC | PRN
  Administered 2015-08-10 – 2015-08-14 (×5): 1 mg via INTRAVENOUS
  Filled 2015-08-09 (×5): qty 1

## 2015-08-09 MED ORDER — CETYLPYRIDINIUM CHLORIDE 0.05 % MT LIQD
7.0000 mL | Freq: Two times a day (BID) | OROMUCOSAL | Status: DC
  Administered 2015-08-09 – 2015-08-13 (×11): 7 mL via OROMUCOSAL

## 2015-08-09 MED ORDER — DILTIAZEM HCL ER COATED BEADS 180 MG PO CP24
180.0000 mg | ORAL_CAPSULE | Freq: Every day | ORAL | Status: DC
  Administered 2015-08-09: 180 mg via ORAL
  Filled 2015-08-09: qty 1

## 2015-08-09 MED ORDER — FUROSEMIDE 40 MG PO TABS
40.0000 mg | ORAL_TABLET | Freq: Two times a day (BID) | ORAL | Status: DC
  Administered 2015-08-09: 40 mg via ORAL
  Filled 2015-08-09: qty 1

## 2015-08-09 MED ORDER — LIDOCAINE 5 % EX PTCH
1.0000 | MEDICATED_PATCH | Freq: Every day | CUTANEOUS | Status: DC
  Administered 2015-08-09 – 2015-08-14 (×7): 1 via TRANSDERMAL
  Filled 2015-08-09 (×7): qty 1

## 2015-08-09 MED ORDER — HEPARIN SODIUM (PORCINE) 5000 UNIT/ML IJ SOLN
5000.0000 [IU] | Freq: Three times a day (TID) | INTRAMUSCULAR | Status: DC
  Administered 2015-08-09 – 2015-08-14 (×17): 5000 [IU] via SUBCUTANEOUS
  Filled 2015-08-09 (×18): qty 1

## 2015-08-09 MED ORDER — ATORVASTATIN CALCIUM 20 MG PO TABS
20.0000 mg | ORAL_TABLET | Freq: Every day | ORAL | Status: DC
  Administered 2015-08-09 – 2015-08-14 (×6): 20 mg via ORAL
  Filled 2015-08-09 (×6): qty 1

## 2015-08-09 MED ORDER — VANCOMYCIN HCL IN DEXTROSE 1-5 GM/200ML-% IV SOLN
1000.0000 mg | INTRAVENOUS | Status: DC
Start: 2015-08-10 — End: 2015-08-12
  Administered 2015-08-10: 1000 mg via INTRAVENOUS
  Filled 2015-08-09 (×2): qty 200

## 2015-08-09 MED ORDER — METOPROLOL TARTRATE 25 MG PO TABS
12.5000 mg | ORAL_TABLET | Freq: Two times a day (BID) | ORAL | Status: DC
  Administered 2015-08-10: 12.5 mg via ORAL
  Filled 2015-08-09 (×2): qty 1

## 2015-08-09 MED ORDER — FERROUS SULFATE 325 (65 FE) MG PO TABS
325.0000 mg | ORAL_TABLET | Freq: Every day | ORAL | Status: DC
  Administered 2015-08-09 – 2015-08-14 (×6): 325 mg via ORAL
  Filled 2015-08-09 (×6): qty 1

## 2015-08-09 MED ORDER — METRONIDAZOLE 500 MG PO TABS
500.0000 mg | ORAL_TABLET | Freq: Three times a day (TID) | ORAL | Status: DC
  Administered 2015-08-09 – 2015-08-14 (×18): 500 mg via ORAL
  Filled 2015-08-09 (×18): qty 1

## 2015-08-09 NOTE — Progress Notes (Signed)
eLink Physician-Brief Progress Note Patient Name: Sean Delgado. DOB: 02-19-1934 MRN: [id number]   Date of Service  08/09/2015  HPI/Events of Note  New patient evaluation: PNA, sepsis, ESRD.  eICU Interventions  Nothing further to add.     Intervention Category Major Interventions: Other:  Aayana Reinertsen 08/09/2015, 2:58 AM

## 2015-08-09 NOTE — Progress Notes (Signed)
Initial Nutrition Assessment   INTERVENTION:   Meals and Snacks: Cater to patient preferences on dysphagia I, honey thick liquids Medical Food Supplement Therapy: will recommend Honey thick Mighty Shakes on meal trays TID and Magic Cup BID for added nutrition (each supplements provides approximately 300kcals and 9g protein) Coordination of Care: pt currently a partial code, not to intubate. RD notes on 1/26, palliative care following, decision made to not place any kind of feeding tubes for nutrition support. Pt continues on dysphagia I, honey thick liquids. No further recommendations at this time but will continue to cater to pt preferences. Please re-consult RD if more aggressive nutrition care is desired.   NUTRITION DIAGNOSIS:   Increased nutrient needs related to wound healing, chronic illness as evidenced by estimated needs.  GOAL:   Patient will meet greater than or equal to 90% of their needs  MONITOR:    (Energy Intake, Electrolyte and Renal Profile, Skin, Anthropometrics)  REASON FOR ASSESSMENT:    (Diet Order)    ASSESSMENT:    Pt admitted from [facility] with sepsis secondary to pna. Pt recently discharged from [facility] on 08/04/2015 and readmitted after becoming hypoxic during HD.   Past Medical History  Diagnosis Date  . ESRD (end stage renal disease) (HCC)   . Hypertension   . CHF (congestive heart failure) (HCC)   . Gout   . Glaucoma   . Neuropathy (HCC)   . Stroke (HCC)   . Peripheral vascular disease (HCC)   . Coronary artery disease   . A-fib (HCC)   . Dysrhythmia   . Blindness of right eye   . Anemia     iron deficiency     Diet Order:  DIET - DYS 1 Room service appropriate?: Yes; Fluid consistency:: Honey Thick    Current Nutrition: Pt had eaten a small amount per RN this am on rounds. Pt currently on re-breather mask this am.   On last admission, RD ntioes on 1/26 palliative care following, decision made at that time for no feeding tube  placement, and continuance of dysphagia I, honey thick liquids.   Scheduled Medications:  . allopurinol  100 mg Oral Daily  . antiseptic oral rinse  7 mL Mouth Rinse BID  . aspirin EC  81 mg Oral Daily  . atorvastatin  20 mg Oral QHS  . brimonidine  1 drop Left Eye BID  . [START ON 08/10/2015] ceFEPime (MAXIPIME) IV  2 g Intravenous Q T,Th,Sa-HD  . clopidogrel  75 mg Oral Daily  . diltiazem  180 mg Oral Daily  . docusate sodium  100 mg Oral BID  . ferrous sulfate  325 mg Oral Daily  . furosemide  40 mg Oral BID  . heparin  5,000 Units Subcutaneous 3 times per day  . isosorbide mononitrate  60 mg Oral Daily  . latanoprost  1 drop Both Eyes QHS  . lidocaine  1 patch Transdermal Daily  . lidocaine  1 patch Transdermal Q24H  . megestrol  400 mg Oral BID  . metoprolol  200 mg Oral Daily  . metroNIDAZOLE  500 mg Oral 3 times per day  . [START ON 08/10/2015] pneumococcal 23 valent vaccine  0.5 mL Intramuscular Tomorrow-1000  . pregabalin  75 mg Oral Daily  . sodium bicarbonate  650 mg Oral BID  . sodium chloride flush  3 mL Intravenous Q12H  . tolnaftate  1 application Topical Daily  . vancomycin  1,000 mg Intravenous Once  . [START ON  08/10/2015] vancomycin  1,000 mg Intravenous Q T,Th,Sa-HD    Continuous Medications:  . sodium chloride 100 mL/hr at 08/09/15 1120     Electrolyte/Renal Profile and Glucose Profile:   Recent Labs Lab 08/02/15 1856 08/03/15 0940 08/08/15 2207 08/09/15 0953  NA  --  142 140 139  K  --  3.4* 3.1* 3.2*  CL  --  103 97* 101  CO2  --  30 33* 28  BUN  --  30* 12 15  CREATININE  --  5.61* 3.08* 3.45*  CALCIUM  --  8.1* 7.5* 7.4*  PHOS 4.9* 6.3*  --   --   GLUCOSE  --  85 104* 94   Protein Profile:  Recent Labs Lab 08/03/15 0940 08/08/15 2207  ALBUMIN 1.7* 2.1*    Gastrointestinal Profile: Last BM:  08/09/2015   Nutrition-Focused Physical Exam Findings:  Unable to complete Nutrition-Focused physical exam at this time.    Weight Change: Pt  with weight loss per [facility] weight encounters.   Skin:   (Deep Tissue Injury to heel)  Height:   Ht Readings from Last 1 Encounters:  08/09/15  (1.854 m)    Weight:   Wt Readings from Last 1 Encounters:  08/09/15 204 lb 5.9 oz (92.7 kg)   Wt Readings from Last 10 Encounters:  08/09/15 204 lb 5.9 oz (92.7 kg)  08/03/15 202 lb 13.2 oz (92 kg)  06/26/15 218 lb 11.1 oz (99.2 kg)  06/23/15 225 lb (102.059 kg)  06/03/15 225 lb 8.5 oz (102.3 kg)  05/18/15 259 lb (117.482 kg)  04/17/15 240 lb 4.8 oz (109 kg)  03/30/15 257 lb (116.574 kg)  03/11/15 239 lb 12.8 oz (108.773 kg)    BMI:  Body mass index is 26.97 kg/(m^2).   EDUCATION NEEDS:   No education needs identified at this time   LOW Care Level  Leda Quail, RD, LDN Pager [phone] Weekend/On-Call Pager [phone]

## 2015-08-09 NOTE — Progress Notes (Signed)
Subjective:   Patient known to our practice from previous admission His last dialysis was yesterday He was sent from the dialysis center because of shortness of breath He reports cough No fevers or chills He says that he feels like his usual self  Objective:  Vital signs in last 24 hours:  Temp:  [97 F (36.1 C)-98.8 F (37.1 C)] 98.4 F (36.9 C) (02/01 0800) Pulse Rate:  [67-114] 67 (02/01 1400) Resp:  [16-27] 23 (02/01 1400) BP: (83-115)/(33-55) 93/41 mmHg (02/01 1300) SpO2:  [91 %-100 %] 91 % (02/01 1400) Weight:  [92.7 kg (204 lb 5.9 oz)] 92.7 kg (204 lb 5.9 oz) (02/01 0242)  Weight change:  Filed Weights   08/09/15 0242  Weight: 92.7 kg (204 lb 5.9 oz)    Intake/Output:    Intake/Output Summary (Last 24 hours) at 08/09/15 1530 Last data filed at 08/09/15 1120  Gross per 24 hour  Intake    510 ml  Output      0 ml  Net    510 ml     Physical Exam: General:  no acute distress, laying in the bed   HEENT  anicteric, moist oral mucous membranes , right eye blindness  Neck  supple   Pulm/lungs  coarse breath sounds, normal effort  CVS/Heart  S1S2 irregular  Abdomen:   soft, nontender, nondistended   Extremities:  no peripheral edema   Neurologic:  alert, oriented, follows commands  Skin:  no acute rashes   Access: RIJ permcath 08/02/15 Dr. Gilda Crease. Right AVF maturing with bruit and thrill       Basic Metabolic Panel:   Recent Labs Lab 08/02/15 1856 08/03/15 0940 08/08/15 2207 08/09/15 0953  NA  --  142 140 139  K  --  3.4* 3.1* 3.2*  CL  --  103 97* 101  CO2  --  30 33* 28  GLUCOSE  --  85 104* 94  BUN  --  30* 12 15  CREATININE  --  5.61* 3.08* 3.45*  CALCIUM  --  8.1* 7.5* 7.4*  PHOS 4.9* 6.3*  --   --      CBC:  Recent Labs Lab 08/03/15 0940 08/08/15 2207 08/09/15 0953  WBC 8.3 12.3* 8.9  NEUTROABS  --  9.8*  --   HGB 8.2* 9.1* 9.9*  HCT 27.1* 30.4* 33.5*  MCV 86.7 86.0 88.4  PLT 339 293 260      Microbiology:  Recent  Results (from the past 720 hour(s))  Culture, blood (routine x 2)     Status: None   Collection Time: 07/22/15  6:21 PM  Result Value Ref Range Status   Specimen Description BLOOD LEFT HAND  Final   Special Requests BOTTLES DRAWN AEROBIC AND ANAEROBIC  4CC  Final   Culture  Setup Time   Final    GRAM POSITIVE COCCI IN CLUSTERS AEROBIC BOTTLE ONLY CRITICAL RESULT CALLED TO, READ BACK BY AND VERIFIED WITH: CRYSTAL [facility] 07/23/15 1330 MLM    Culture   Final    METHICILLIN RESISTANT STAPHYLOCOCCUS AUREUS AEROBIC BOTTLE ONLY    Report Status 07/27/2015 FINAL  Final   Organism ID, Bacteria METHICILLIN RESISTANT STAPHYLOCOCCUS AUREUS  Final      Susceptibility   Methicillin resistant staphylococcus aureus - MIC*    CIPROFLOXACIN >=8 RESISTANT Resistant     ERYTHROMYCIN <=0.25 SENSITIVE Sensitive     GENTAMICIN <=0.5 SENSITIVE Sensitive     OXACILLIN >=4 RESISTANT Resistant     VANCOMYCIN 1  SENSITIVE Sensitive     TRIMETH/SULFA <=10 SENSITIVE Sensitive     CLINDAMYCIN <=0.25 SENSITIVE Sensitive     CEFOXITIN SCREEN Value in next row Resistant      POSITIVECEFOXITIN SCREEN - This test may be used to predict mecA-mediated oxacillin resistance, and it is based on the cefoxitin disk screen test.  The cefoxitin screen and oxacillin work in combination to determine the final interpretation reported for oxacillin.     Inducible Clindamycin Value in next row Sensitive      POSITIVECEFOXITIN SCREEN - This test may be used to predict mecA-mediated oxacillin resistance, and it is based on the cefoxitin disk screen test.  The cefoxitin screen and oxacillin work in combination to determine the final interpretation reported for oxacillin.     TETRACYCLINE Value in next row Sensitive      SENSITIVE<=1    * METHICILLIN RESISTANT STAPHYLOCOCCUS AUREUS  Culture, blood (routine x 2)     Status: None   Collection Time: 07/22/15  6:21 PM  Result Value Ref Range Status   Specimen Description BLOOD LEFT HAND   Final   Special Requests   Final    BOTTLES DRAWN AEROBIC AND ANAEROBIC  AER 2CC ANA 4CC   Culture  Setup Time   Final    GRAM POSITIVE COCCI IN CLUSTERS IN BOTH AEROBIC AND ANAEROBIC BOTTLES CRITICAL RESULT CALLED TO, READ BACK BY AND VERIFIED WITH: CRYSTAL SCARPENA 07/23/15 1400 MLM    Culture METHICILLIN RESISTANT STAPHYLOCOCCUS AUREUS  Final   Report Status 07/27/2015 FINAL  Final   Organism ID, Bacteria METHICILLIN RESISTANT STAPHYLOCOCCUS AUREUS  Final      Susceptibility   Methicillin resistant staphylococcus aureus - MIC*    CIPROFLOXACIN >=8 RESISTANT Resistant     ERYTHROMYCIN 0.5 SENSITIVE Sensitive     GENTAMICIN <=0.5 SENSITIVE Sensitive     OXACILLIN >=4 RESISTANT Resistant     VANCOMYCIN 1 SENSITIVE Sensitive     TRIMETH/SULFA <=10 SENSITIVE Sensitive     CLINDAMYCIN <=0.25 SENSITIVE Sensitive     CEFOXITIN SCREEN Value in next row Resistant      POSITIVECEFOXITIN SCREEN - This test may be used to predict mecA-mediated oxacillin resistance, and it is based on the cefoxitin disk screen test.  The cefoxitin screen and oxacillin work in combination to determine the final interpretation reported for oxacillin.     Inducible Clindamycin Value in next row Sensitive      POSITIVECEFOXITIN SCREEN - This test may be used to predict mecA-mediated oxacillin resistance, and it is based on the cefoxitin disk screen test.  The cefoxitin screen and oxacillin work in combination to determine the final interpretation reported for oxacillin.     TETRACYCLINE Value in next row Sensitive      SENSITIVE<=1    * METHICILLIN RESISTANT STAPHYLOCOCCUS AUREUS  Blood Culture ID Panel (Reflexed)     Status: Abnormal   Collection Time: 07/22/15  6:21 PM  Result Value Ref Range Status   Enterococcus species NOT DETECTED NOT DETECTED Final   Listeria monocytogenes NOT DETECTED NOT DETECTED Final   Staphylococcus species DETECTED (A) NOT DETECTED Final   Staphylococcus aureus DETECTED (A) NOT DETECTED  Final    Comment: CRITICAL RESULT CALLED TO, READ BACK BY AND VERIFIED WITH: CRYSTAL SCARPENA 07/23/15 1330 ABOUT STAPH SPECIES,STAPH AUREUS, AND MecA DETECTED MLM    Streptococcus species NOT DETECTED NOT DETECTED Final   Streptococcus agalactiae NOT DETECTED NOT DETECTED Final   Streptococcus pneumoniae NOT DETECTED NOT DETECTED Final  Streptococcus pyogenes NOT DETECTED NOT DETECTED Final   Acinetobacter baumannii NOT DETECTED NOT DETECTED Final   Enterobacteriaceae species NOT DETECTED NOT DETECTED Final   Enterobacter cloacae complex NOT DETECTED NOT DETECTED Final   Escherichia coli NOT DETECTED NOT DETECTED Final   Klebsiella oxytoca NOT DETECTED NOT DETECTED Final   Klebsiella pneumoniae NOT DETECTED NOT DETECTED Final   Proteus species NOT DETECTED NOT DETECTED Final   Serratia marcescens NOT DETECTED NOT DETECTED Final   Haemophilus influenzae NOT DETECTED NOT DETECTED Final   Neisseria meningitidis NOT DETECTED NOT DETECTED Final   Pseudomonas aeruginosa NOT DETECTED NOT DETECTED Final   Candida albicans NOT DETECTED NOT DETECTED Final   Candida glabrata NOT DETECTED NOT DETECTED Final   Candida krusei NOT DETECTED NOT DETECTED Final   Candida parapsilosis NOT DETECTED NOT DETECTED Final   Candida tropicalis NOT DETECTED NOT DETECTED Final   Carbapenem resistance NOT DETECTED NOT DETECTED Final   Methicillin resistance DETECTED (A) NOT DETECTED Final   Vancomycin resistance NOT DETECTED NOT DETECTED Final  MRSA PCR Screening     Status: Abnormal   Collection Time: 07/23/15  5:24 AM  Result Value Ref Range Status   MRSA by PCR POSITIVE (A) NEGATIVE Final    Comment:        The GeneXpert MRSA Assay (FDA approved for NASAL specimens only), is one component of a comprehensive MRSA colonization surveillance program. It is not intended to diagnose MRSA infection nor to guide or monitor treatment for MRSA infections. CRITICAL RESULT CALLED TO, READ BACK BY AND VERIFIED  WITH: ALISHA BABB ON 07/23/15 AT 0735 BY QSD   CULTURE, BLOOD (ROUTINE X 2) w Reflex to PCR ID Panel     Status: None   Collection Time: 07/25/15  6:08 AM  Result Value Ref Range Status   Specimen Description BLOOD LEFT AC  Final   Special Requests BOTTLES DRAWN AEROBIC AND ANAEROBIC ANA2ML AER3ML  Final   Culture NO GROWTH 5 DAYS  Final   Report Status 07/30/2015 FINAL  Final  CULTURE, BLOOD (ROUTINE X 2) w Reflex to PCR ID Panel     Status: None   Collection Time: 07/25/15  6:08 AM  Result Value Ref Range Status   Specimen Description BLOOD LEFT WRIST  Final   Special Requests   Final    BOTTLES DRAWN AEROBIC AND ANAEROBIC ANA AER   Culture NO GROWTH 5 DAYS  Final   Report Status 07/30/2015 FINAL  Final  Cath Tip Culture     Status: None   Collection Time: 07/28/15  7:32 PM  Result Value Ref Range Status   Specimen Description CATH TIP  Final   Special Requests NONE  Final   Culture NO GROWTH 3 DAYS  Final   Report Status 07/31/2015 FINAL  Final  C difficile quick scan w PCR reflex     Status: Abnormal   Collection Time: 07/31/15 10:28 AM  Result Value Ref Range Status   C Diff antigen POSITIVE (A) NEGATIVE Final   C Diff toxin NEGATIVE NEGATIVE Final   C Diff interpretation   Final    Positive for toxigenic C. difficile, active toxin production not detected. Patient has toxigenic C. difficile organisms present in the bowel, but toxin was not detected. The patient may be a carrier or the level of toxin in the sample was below the limit  of detection. This information should be used in conjunction with the patient's clinical history when deciding on  possible therapy.   Clostridium Difficile by PCR     Status: Abnormal   Collection Time: 07/31/15 10:28 AM  Result Value Ref Range Status   Toxigenic C Difficile by pcr POSITIVE (A) NEGATIVE Final    Comment: CRITICAL RESULT CALLED TO, READ BACK BY AND VERIFIED WITH: TAYLOR BECK 07/31/15 AT 1211 VKB     Coagulation  Studies:  Recent Labs  08/08/15 2207  LABPROT 15.9*  INR 1.26    Urinalysis: No results for input(s): COLORURINE, LABSPEC, PHURINE, GLUCOSEU, HGBUR, BILIRUBINUR, KETONESUR, PROTEINUR, UROBILINOGEN, NITRITE, LEUKOCYTESUR in the last 72 hours.  Invalid input(s): APPERANCEUR    Imaging: Dg Chest Port 1 View  08/08/2015  CLINICAL DATA:  Sepsis EXAM: PORTABLE CHEST 1 VIEW COMPARISON:  08/04/2015 FINDINGS: Dialysis catheter on the right tip at the upper right atrium. There is bibasilar airspace disease, most dense in the right middle lobe. No effusion or cavitation. No pulmonary edema. Cardiomegaly. IMPRESSION: Bibasilar airspace disease consistent with pneumonia given the history. Electronically Signed   By: Marnee Spring M.D.   On: 08/08/2015 22:19     Medications:   . sodium chloride 100 mL/hr at 08/09/15 1120   . allopurinol  100 mg Oral Daily  . antiseptic oral rinse  7 mL Mouth Rinse BID  . aspirin EC  81 mg Oral Daily  . atorvastatin  20 mg Oral QHS  . brimonidine  1 drop Left Eye BID  . [START ON 08/10/2015] ceFEPime (MAXIPIME) IV  2 g Intravenous Q T,Th,Sa-HD  . clopidogrel  75 mg Oral Daily  . docusate sodium  100 mg Oral BID  . ferrous sulfate  325 mg Oral Daily  . heparin  5,000 Units Subcutaneous 3 times per day  . latanoprost  1 drop Both Eyes QHS  . lidocaine  1 patch Transdermal Daily  . lidocaine  1 patch Transdermal Q24H  . megestrol  400 mg Oral BID  . metroNIDAZOLE  500 mg Oral 3 times per day  . [START ON 08/10/2015] pneumococcal 23 valent vaccine  0.5 mL Intramuscular Tomorrow-1000  . pregabalin  75 mg Oral Daily  . sodium bicarbonate  650 mg Oral BID  . sodium chloride flush  3 mL Intravenous Q12H  . tolnaftate  1 application Topical Daily  . vancomycin  1,000 mg Intravenous Once  . [START ON 08/10/2015] vancomycin  1,000 mg Intravenous Q T,Th,Sa-HD   acetaminophen **OR** acetaminophen, benzonatate, morphine injection, ondansetron **OR** ondansetron  (ZOFRAN) IV  Assessment/ Plan:  82 y.o. black male  with right eye blindness, diastolic congestive heart failure, hypertension, gout, glaucoma, peripheral neuropathy, atrial fibrillation, anemia, carotid stenosis status post bilateral CEA, CVA, peripheral vascular disease   [facility]/ TTS  1. End Stage Renal Disease: tolerating treatment well. Continue TTS schedule.  - will plan on dialysis tomorrow - Electrolytes and Volume status are acceptable No acute indication for Dialysis at present   2. Anemia of chronic kidney disease: Hemoglobin 9.9 - continue epogen IV with HD.   3. Secondary Hyperparathyroidism:  - Phosphorus during this hospitalization  4. HTN Antihypertensives are being held Consider continuing low-dose metoprolol to prevent abrupt withdrawal of medication   LOS: 0 Loris Seelye 2/1/20173:30 PM

## 2015-08-09 NOTE — Progress Notes (Signed)
Notified Dr. Ardyth Man that I held Imdur and metoprolol this morning due to patient's BP dropping in the 90's and 80's.  Dr. Thedore Mins made aware as well-he stated that he will look at his order and may change the dosage of the metoprolol.  Potassium is 3.2 and Lactic acid 2.4- relayed this information to Dr. Ardyth Man- He stated that pharmacy will place orders to replace potassium after rounds.

## 2015-08-09 NOTE — Care Management (Signed)
Readmission within 48 hours of discharge to a skilled nursing facility.  Hd.  Anticipate transfer out of icu today

## 2015-08-09 NOTE — Progress Notes (Signed)
Per Dr. Bard Herbert, maintain MAP of 60 bc pt is ESRD. Levo currently at , and maintianing MAP >60.

## 2015-08-09 NOTE — Progress Notes (Addendum)
[facility] [place] at [facility]   PATIENT NAME: Sean Delgado    MR#:  [id number]  DATE OF BIRTH:  [birth year]  SUBJECTIVE:  CHIEF COMPLAINT:   Chief Complaint  Patient presents with  . Shortness of Breath   SOB REVIEW OF SYSTEMS:  CONSTITUTIONAL: No fever,  Has  weakness.  EYES: No blurred or double vision.  EARS, NOSE, AND THROAT: No tinnitus or ear pain.  RESPIRATORY: No cough, has shortness of breath, wheezing or hemoptysis.  CARDIOVASCULAR: No chest pain, orthopnea, edema.  GASTROINTESTINAL: No nausea, vomiting, diarrhea or abdominal pain.  GENITOURINARY: No dysuria, hematuria.  ENDOCRINE: No polyuria, nocturia,  HEMATOLOGY: No anemia, easy bruising or bleeding SKIN: No rash or lesion. MUSCULOSKELETAL: No joint pain or arthritis.   NEUROLOGIC: No tingling, numbness, weakness.  PSYCHIATRY: No anxiety or depression.   DRUG ALLERGIES:  No Known Allergies  VITALS:  Blood pressure 93/41, pulse 67, temperature 98.4 F (36.9 C), temperature source Axillary, resp. rate 23, height  (1.854 m), weight 92.7 kg (204 lb 5.9 oz), SpO2 91 %.  PHYSICAL EXAMINATION:  GENERAL:  82 y.o.-year-old patient lying in the bed with lethargy. EYES: right blind eye.  No scleral icterus. Extraocular muscles intact.  HEENT: Head atraumatic, normocephalic. Oropharynx and nasopharynx clear.  NECK:  Supple, no jugular venous distention. No thyroid enlargement, no tenderness.  LUNGS: diminished breath sounds bilaterally, no wheezing, rales,rhonchi or crepitation. No use of accessory muscles of respiration.  CARDIOVASCULAR: S1, S2 normal. No murmurs, rubs, or gallops.  ABDOMEN: Soft, nontender, nondistended. Bowel sounds present. No organomegaly or mass.  EXTREMITIES: No pedal edema, cyanosis, or clubbing.  NEUROLOGIC: Cranial nerves II through XII are intact. Muscle strength 4/5 in all extremities. Sensation intact. Gait not checked.  PSYCHIATRIC: The patient is alert  and oriented x 3.  SKIN: No obvious rash, lesion, or ulcer.    LABORATORY PANEL:   CBC  Recent Labs Lab 08/09/15 0953  WBC 8.9  HGB 9.9*  HCT 33.5*  PLT 260   ------------------------------------------------------------------------------------------------------------------  Chemistries   Recent Labs Lab 08/08/15 2207 08/09/15 0953  NA 140 139  K 3.1* 3.2*  CL 97* 101  CO2 33* 28  GLUCOSE 104* 94  BUN 12 15  CREATININE 3.08* 3.45*  CALCIUM 7.5* 7.4*  AST 15  --   ALT 8*  --   ALKPHOS 68  --   BILITOT 0.6  --    ------------------------------------------------------------------------------------------------------------------  Cardiac Enzymes  Recent Labs Lab 08/08/15 2207  TROPONINI 0.05*   ------------------------------------------------------------------------------------------------------------------  RADIOLOGY:  Dg Chest Port 1 View  08/08/2015  CLINICAL DATA:  Sepsis EXAM: PORTABLE CHEST 1 VIEW COMPARISON:  08/04/2015 FINDINGS: Dialysis catheter on the right tip at the upper right atrium. There is bibasilar airspace disease, most dense in the right middle lobe. No effusion or cavitation. No pulmonary edema. Cardiomegaly. IMPRESSION: Bibasilar airspace disease consistent with pneumonia given the history. Electronically Signed   By: Marnee Spring M.D.   On: 08/08/2015 22:19    EKG:   Orders placed or performed during the hospital encounter of 08/08/15  . ED EKG 12-Lead  . ED EKG 12-Lead  . EKG 12-Lead  . EKG 12-Lead    ASSESSMENT AND PLAN:   1. Sepsis, septic shock. Still hypotension after NS bolus x 2, start levophed drip. f/uBlood cultures, continue cefepime, zosyn and vancomycin   2. Pneumonia: Due to end-stage renal disease on dialysis we will treat the patient for healthcare associated pneumonia.  On  NRB this am and change to O2 [place] 6L. NEB.  3. C. difficile colitis: Continue oral Flagyl  4. CAD: Stable. Continue aspirin and Plavix 5.  Essential hypertension: hold lasix, lopressor,  Imdur and diltiazem 6. CHF: Systolic; stable. Hold  Lasix. 7. Hyperlipidemia: Continue atorvastatin  Acute respiratory failure with hypoxia. Continue O2 [place], NEB. Treat PNA.  Lactic acidosis. F/u lactic acid.  ESRD, continue HD.   I discussed with Dr. Thedore Mins and pt's home health nurse. Pt wants DNI.  All the records are reviewed and case discussed with Care Management/Social Workerr. Management plans discussed with the patient, family and they are in agreement.  CODE STATUS: DNI  TOTAL CRITICAL TIME TAKING CARE OF THIS PATIENT: 45 minutes.  Greater than 50% time was spent on coordination of care and face-to-face counseling.  POSSIBLE D/C IN >3 DAYS, DEPENDING ON CLINICAL CONDITION.   Shaune Pollack M.D on 08/09/2015 at 2:33 PM  Between 7am to 6pm - Pager - [phone]  After 6pm go to [email or web address] - password [email or web address] [facility]  [facility]  Office  [phone]  CC: Primary care physician; Bobbye Morton, MD

## 2015-08-09 NOTE — Progress Notes (Signed)
Notifed [facility] RN that patient is a dialysis patient and that he received bolus times 2 and had just started bolus but stopped it bc patients lungs sounds are crackles.  Will start levophed drip- per Dr. Ardyth Man no order for a central line unless patient needs or more.

## 2015-08-09 NOTE — H&P (Addendum)
Sean Delgado. is an 82 y.o. male.   Chief Complaint: Shortness of breath HPI: The patient presents to the emergency department from dialysis after becoming hypoxic during his dialysis treatment. Oxygen saturations were reportedly in the 70s prior to placement of nonrebreather mask. Since supplement oxygen has been in place the patient's respiratory rate is decreased and he is more comfortable. Oxygen saturations have normalized as well. He denies fever, nausea, vomiting or diarrhea. The patient also denies chest pain but states that he feels generally weak. Vital signs and leukocytosis or concerning for sepsis which prompted the emergency department to obtain blood cultures and start broad-spectrum antibiotics. Initially he was hypotensive but he is fluid responsive. Once the patient's condition stabilized emergency department staff called for admission.  Past Medical History  Diagnosis Date  . ESRD (end stage renal disease) ([place])   . Hypertension   . CHF (congestive heart failure) ([place])   . Gout   . Glaucoma   . Neuropathy ([place])   . Stroke ([place])   . Peripheral vascular disease ([place])   . Coronary artery disease   . A-fib ([place])   . Dysrhythmia   . Blindness of right eye   . Anemia     iron deficiency    Past Surgical History  Procedure Laterality Date  . Vascular surgery Bilateral     Carotid Endarterectomy  . Eye surgery Left     Cataract Extraction  . Av fistula placement Left 04/06/2015    Procedure: ARTERIOVENOUS (AV) FISTULA CREATION;  Surgeon: Algernon Huxley, MD;  Location: [facility] ORS;  Service: Vascular;  Laterality: Left;  . Peripheral vascular catheterization N/A 04/12/2015    Procedure: Dialysis/Perma Catheter Insertion;  Surgeon: Algernon Huxley, MD;  Location: [place] [facility];  Service: Cardiovascular;  Laterality: N/A;  . Peripheral vascular catheterization N/A 05/18/2015    Procedure: Dialysis/Perma Catheter Insertion;  Surgeon: Algernon Huxley, MD;  Location: [place] [facility]  [facility];  Service: Cardiovascular;  Laterality: N/A;  . Peripheral vascular catheterization Left 05/29/2015    Procedure: A/V Shuntogram/Fistulagram;  Surgeon: Algernon Huxley, MD;  Location: [place] [facility];  Service: Cardiovascular;  Laterality: Left;  . Peripheral vascular catheterization N/A 05/29/2015    Procedure: A/V Shunt Intervention;  Surgeon: Algernon Huxley, MD;  Location: [place] [facility];  Service: Cardiovascular;  Laterality: N/A;  . Peripheral vascular catheterization N/A 06/26/2015    Procedure: Dialysis/Perma Catheter Insertion;  Surgeon: Algernon Huxley, MD;  Location: [place] [facility];  Service: Cardiovascular;  Laterality: N/A;  . Av fistula placement Right 06/29/2015    Procedure: BRACHIAL CEPHALIC AV FISTULA;  Surgeon: Algernon Huxley, MD;  Location: [facility] ORS;  Service: Vascular;  Laterality: Right;  . Peripheral vascular catheterization N/A 08/02/2015    Procedure: Dialysis/Perma Catheter Insertion;  Surgeon: Katha Cabal, MD;  Location: [place] [facility];  Service: Cardiovascular;  Laterality: N/A;    Family History  Problem Relation Age of Onset  . Diabetes Mother    Social History:  reports that he quit smoking about 8 years ago. His smoking use included Cigarettes. He has a 3.75 pack-year smoking history. He has never used smokeless tobacco. He reports that he does not drink alcohol or use illicit drugs.  Allergies: No Known Allergies  Medications Prior to Admission  Medication Sig Dispense Refill  . acetaminophen (TYLENOL) 325 MG tablet Take 650 mg by mouth every 6 (six) hours as needed for mild pain or moderate pain. *note  dose*    . allopurinol (ZYLOPRIM) 100 MG tablet Take 100 mg by mouth daily.    Marland Kitchen aspirin EC 81 MG tablet Take 81 mg by mouth daily. Reported on 06/23/2015    . atorvastatin (LIPITOR) 20 MG tablet Take 1 tablet (20 mg total) by mouth daily. (Patient taking differently: Take 20 mg by mouth at bedtime. ) 30 tablet 0  . b complex-vitamin c-folic  acid (NEPHRO-VITE) 0.8 MG TABS tablet Take 1 tablet by mouth daily.    . benzonatate (TESSALON) 100 MG capsule Take 1 capsule (100 mg total) by mouth 3 (three) times daily as needed for cough. 20 capsule 0  . brimonidine (ALPHAGAN) 0.2 % ophthalmic solution Place 1 drop into the left eye 2 (two) times daily.    . clopidogrel (PLAVIX) 75 MG tablet Take 75 mg by mouth daily.    Marland Kitchen diltiazem (CARDIZEM CD) 180 MG 24 hr capsule Take 1 capsule (180 mg total) by mouth daily. 30 capsule 0  . ferrous sulfate 325 (65 FE) MG EC tablet Take 325 mg by mouth daily.    . furosemide (LASIX) 40 MG tablet Take 1 tablet (40 mg total) by mouth 2 (two) times daily. 30 tablet 0  . isosorbide mononitrate (IMDUR) 60 MG 24 hr tablet Take 60 mg by mouth daily.    Marland Kitchen latanoprost (XALATAN) 0.005 % ophthalmic solution Place 1 drop into both eyes at bedtime.    . lidocaine (LIDODERM) 5 % Place 1 patch onto the skin daily. Apply to right knee and right buttock at 6am. Remove & Discard patch within 12 hours or as directed by MD    . megestrol (MEGACE) 400 MG/10ML suspension Take 10 mLs (400 mg total) by mouth 2 (two) times daily. 240 mL 0  . metoprolol (TOPROL-XL) 200 MG 24 hr tablet Take 200 mg by mouth daily.    . metroNIDAZOLE (FLAGYL) 500 MG tablet Take 1 tablet (500 mg total) by mouth every 8 (eight) hours.    . pregabalin (LYRICA) 75 MG capsule Take 75 mg by mouth daily.     . sodium bicarbonate 650 MG tablet Take 650 mg by mouth 2 (two) times daily.    Marland Kitchen tolnaftate (TINACTIN) 1 % powder Apply 1 application topically daily. Apply on both feet.    . Vancomycin (VANCOCIN) 750 MG/150ML SOLN Inject 150 mLs (750 mg total) into the vein Every Tuesday,Thursday,and Saturday with dialysis. 4000 mL     Results for orders placed or performed during the hospital encounter of 08/08/15 (from the past 48 hour(s))  TSH     Status: None   Collection Time: 08/08/15 10:01 PM  Result Value Ref Range   TSH 1.436 0.350 - 4.500 uIU/mL  Lactic  acid, plasma     Status: None   Collection Time: 08/08/15 10:07 PM  Result Value Ref Range   Lactic Acid, Venous 1.6 0.5 - 2.0 mmol/L  Comprehensive metabolic panel     Status: Abnormal   Collection Time: 08/08/15 10:07 PM  Result Value Ref Range   Sodium 140 135 - 145 mmol/L   Potassium 3.1 (L) 3.5 - 5.1 mmol/L   Chloride 97 (L) 101 - 111 mmol/L   CO2 33 (H) 22 - 32 mmol/L   Glucose, Bld 104 (H) 65 - 99 mg/dL   BUN 12 6 - 20 mg/dL   Creatinine, Ser 3.08 (H) 0.61 - 1.24 mg/dL   Calcium 7.5 (L) 8.9 - 10.3 mg/dL   Total Protein 7.2 6.5 -  8.1 g/dL   Albumin 2.1 (L) 3.5 - 5.0 g/dL   AST 15 15 - 41 U/L   ALT 8 (L) 17 - 63 U/L   Alkaline Phosphatase 68 38 - 126 U/L   Total Bilirubin 0.6 0.3 - 1.2 mg/dL   GFR calc non Af Amer 18 (L) >60 mL/min   GFR calc Af Amer 21 (L) >60 mL/min    Comment: (NOTE) The eGFR has been calculated using the CKD EPI equation. This calculation has not been validated in all clinical situations. eGFR's persistently <60 mL/min signify possible Chronic Kidney Disease.    Anion gap 10 5 - 15  Troponin I     Status: Abnormal   Collection Time: 08/08/15 10:07 PM  Result Value Ref Range   Troponin I 0.05 (H) <0.031 ng/mL    Comment: READ BACK AND VERIFIED WITH SHANNON MARTIN AT 2300 ON 08/08/15 [facility]        PERSISTENTLY INCREASED TROPONIN VALUES IN THE RANGE OF 0.04-0.49 ng/mL CAN BE SEEN IN:       -UNSTABLE ANGINA       -CONGESTIVE HEART FAILURE       -MYOCARDITIS       -CHEST TRAUMA       -ARRYHTHMIAS       -LATE PRESENTING MYOCARDIAL INFARCTION       -COPD   CLINICAL FOLLOW-UP RECOMMENDED.   CBC WITH DIFFERENTIAL     Status: Abnormal   Collection Time: 08/08/15 10:07 PM  Result Value Ref Range   WBC 12.3 (H) 3.8 - 10.6 K/uL   RBC 3.54 (L) 4.40 - 5.90 MIL/uL   Hemoglobin 9.1 (L) 13.0 - 18.0 g/dL   HCT 30.4 (L) 40.0 - 52.0 %   MCV 86.0 80.0 - 100.0 fL   MCH 25.6 (L) 26.0 - 34.0 pg   MCHC 29.8 (L) 32.0 - 36.0 g/dL   RDW 23.0 (H) 11.5 - 14.5 %    Platelets 293 150 - 440 K/uL   Neutrophils Relative % 80% %   Neutro Abs 9.8 (H) 1.4 - 6.5 K/uL   Lymphocytes Relative 11% %   Lymphs Abs 1.4 1.0 - 3.6 K/uL   Monocytes Relative 7% %   Monocytes Absolute 0.9 0.2 - 1.0 K/uL   Eosinophils Relative 1% %   Eosinophils Absolute 0.2 0 - 0.7 K/uL   Basophils Relative 1% %   Basophils Absolute 0.1 0 - 0.1 K/uL  Protime-INR     Status: Abnormal   Collection Time: 08/08/15 10:07 PM  Result Value Ref Range   Prothrombin Time 15.9 (H) 11.4 - 15.0 seconds   INR 1.26   Lipase, blood     Status: None   Collection Time: 08/08/15 10:07 PM  Result Value Ref Range   Lipase 24 11 - 51 U/L  Blood gas, venous     Status: Abnormal   Collection Time: 08/08/15 10:39 PM  Result Value Ref Range   FIO2 1.00    Delivery systems NON-REBREATHER OXYGEN MASK    pH, Ven 7.39 7.320 - 7.430   pCO2, Ven 60 44.0 - 60.0 mmHg   pO2, Ven <31.0 30.0 - 45.0 mmHg   Bicarbonate 36.3 (H) 21.0 - 28.0 mEq/L   Acid-Base Excess 9.1 (H) 0.0 - 3.0 mmol/L   Patient temperature 37.0    Collection site VEIN    Sample type VEIN    Dg Chest Port 1 View  08/08/2015  CLINICAL DATA:  Sepsis EXAM: PORTABLE CHEST 1 VIEW COMPARISON:  08/04/2015 FINDINGS: Dialysis catheter on the right tip at the upper right atrium. There is bibasilar airspace disease, most dense in the right middle lobe. No effusion or cavitation. No pulmonary edema. Cardiomegaly. IMPRESSION: Bibasilar airspace disease consistent with pneumonia given the history. Electronically Signed   By: Monte Fantasia M.D.   On: 08/08/2015 22:19    Review of Systems  Constitutional: Negative for fever and chills.  HENT: Negative for sore throat and tinnitus.   Eyes: Negative for blurred vision and redness.  Respiratory: Positive for shortness of breath. Negative for cough.   Cardiovascular: Negative for chest pain, palpitations, orthopnea and PND.  Gastrointestinal: Negative for nausea, vomiting, abdominal pain and diarrhea.   Genitourinary: Negative for dysuria, urgency and frequency.  Musculoskeletal: Negative for myalgias and joint pain.  Skin: Negative for rash.       No lesions  Neurological: Negative for speech change, focal weakness and weakness.  Endo/Heme/Allergies: Does not bruise/bleed easily.       No temperature intolerance  Psychiatric/Behavioral: Negative for depression and suicidal ideas.    Blood pressure 100/50, pulse 75, temperature 97 F (36.1 C), temperature source Axillary, resp. rate 20, height 6' 1"  (1.854 m), weight 92.7 kg (204 lb 5.9 oz), SpO2 100 %. Physical Exam  Nursing note and vitals reviewed. Constitutional: He is oriented to person, place, and time. He appears well-developed and well-nourished. No distress.  HENT:  Head: Normocephalic and atraumatic.  Mouth/Throat: Oropharynx is clear and moist.  Eyes: Conjunctivae and EOM are normal. Pupils are equal, round, and reactive to light. No scleral icterus.  Neck: Normal range of motion. Neck supple. No JVD present. No tracheal deviation present. No thyromegaly present.  Cardiovascular: Normal rate, regular rhythm and normal heart sounds.  Exam reveals no gallop and no friction rub.   No murmur heard. Respiratory: No respiratory distress. He has decreased breath sounds in the right lower field and the left lower field. He has no wheezes. He has no rales.  Nonrebreather mask in place; PermCath in place right chest, site clean and dressed  GI: Soft. Bowel sounds are normal. He exhibits no distension. There is no tenderness.  Genitourinary:  Deferred  Musculoskeletal: Normal range of motion. He exhibits no edema.  Lymphadenopathy:    He has no cervical adenopathy.  Neurological: He is alert and oriented to person, place, and time. No cranial nerve deficit.  Skin: Skin is warm and dry. No erythema.  Psychiatric: He has a normal mood and affect. His behavior is normal. Judgment and thought content normal.      Assessment/Plan This is a 82 year old African-American male admitted for sepsis secondary to pneumonia. 1. Sepsis: Patient meets criteria via tachypnea and leukocytosis. Blood cultures have been obtained and the patient was started on cefepime and vancomycin in the emergency department. We will continue broad-spectrum coverage. Follow blood cultures for growth and sensitivities. The patient is hemodynamically stable. 2. Pneumonia: Due to end-stage renal disease on dialysis we will treat the patient for healthcare associated pneumonia. Supplemental oxygen as needed. Wean as tolerated. 3. C. difficile colitis: Continue oral Flagyl 4. CAD: Stable. Continue aspirin, Imdur and Plavix 5. Essential hypertension: Continue metoprolol and diltiazem 6. CHF: Systolic; stable. Continue maintenance dose of Lasix 7. Hyperlipidemia: Continue atorvastatin 8. DVT prophylaxis: Heparin 9. GI prophylaxis: None The patient is a DO NOT INTUBATE (partial code). Time spent on admission orders and critical patient care approximately 45 minutes  Harrie Foreman 08/09/2015, 4:38 AM

## 2015-08-09 NOTE — Progress Notes (Signed)
Notified Dr. Imogene Burn of 10-beat run of [facility]- patient AFib in 70's at this time.  Orders for magnesium and phosphorus check.

## 2015-08-10 ENCOUNTER — Inpatient Hospital Stay: Payer: Medicare (Managed Care)

## 2015-08-10 DIAGNOSIS — A419 Sepsis, unspecified organism: Principal | ICD-10-CM

## 2015-08-10 DIAGNOSIS — J189 Pneumonia, unspecified organism: Secondary | ICD-10-CM

## 2015-08-10 DIAGNOSIS — J690 Pneumonitis due to inhalation of food and vomit: Secondary | ICD-10-CM

## 2015-08-10 LAB — BASIC METABOLIC PANEL
ANION GAP: 9 (ref 5–15)
BUN: 21 mg/dL — ABNORMAL HIGH (ref 6–20)
CALCIUM: 7.6 mg/dL — AB (ref 8.9–10.3)
CO2: 25 mmol/L (ref 22–32)
CREATININE: 4.24 mg/dL — AB (ref 0.61–1.24)
Chloride: 105 mmol/L (ref 101–111)
GFR, EST AFRICAN AMERICAN: 14 mL/min — AB (ref >60)
GFR, EST NON AFRICAN AMERICAN: 12 mL/min — AB (ref >60)
Glucose, Bld: 88 mg/dL (ref 65–99)
Potassium: 5 mmol/L (ref 3.5–5.1)
SODIUM: 139 mmol/L (ref 135–145)

## 2015-08-10 LAB — CBC
HCT: 29.3 % — ABNORMAL LOW (ref 40.0–52.0)
Hemoglobin: 8.6 g/dL — ABNORMAL LOW (ref 13.0–18.0)
MCH: 26.6 pg (ref 26.0–34.0)
MCHC: 29.4 g/dL — ABNORMAL LOW (ref 32.0–36.0)
MCV: 90.4 fL (ref 80.0–100.0)
Platelets: 247 10*3/uL (ref 150–440)
RBC: 3.24 MIL/uL — ABNORMAL LOW (ref 4.40–5.90)
RDW: 22.3 % — ABNORMAL HIGH (ref 11.5–14.5)
WBC: 9.2 10*3/uL (ref 3.8–10.6)

## 2015-08-10 MED ORDER — ALTEPLASE 2 MG IJ SOLR
2.0000 mg | Freq: Once | INTRAMUSCULAR | Status: AC
  Administered 2015-08-10: 2 mg
  Filled 2015-08-10: qty 2

## 2015-08-10 MED ORDER — EPOETIN ALFA 4000 UNIT/ML IJ SOLN
4000.0000 [IU] | INTRAMUSCULAR | Status: DC
  Administered 2015-08-11: 4000 [IU] via INTRAVENOUS
  Filled 2015-08-10 (×2): qty 1

## 2015-08-10 MED ORDER — ALTEPLASE 2 MG IJ SOLR
4.0000 mg | INTRAMUSCULAR | Status: DC | PRN
  Administered 2015-08-10: 4 mg
  Filled 2015-08-10 (×3): qty 4

## 2015-08-10 MED ORDER — MIDODRINE HCL 5 MG PO TABS
5.0000 mg | ORAL_TABLET | Freq: Two times a day (BID) | ORAL | Status: DC
  Administered 2015-08-10 – 2015-08-11 (×2): 5 mg via ORAL
  Filled 2015-08-10 (×2): qty 1

## 2015-08-10 MED ORDER — MIDODRINE HCL 5 MG PO TABS: 10.0000 mg | ORAL_TABLET | Freq: Every day | ORAL | Status: DC | PRN

## 2015-08-10 MED ORDER — GUAIFENESIN ER 600 MG PO TB12
600.0000 mg | ORAL_TABLET | Freq: Two times a day (BID) | ORAL | Status: DC
  Administered 2015-08-11 – 2015-08-14 (×8): 600 mg via ORAL
  Filled 2015-08-10 (×8): qty 1

## 2015-08-10 NOTE — Clinical Social Work Note (Signed)
Clinical Social Work Assessment  Patient Details  Name: Sean Delgado. MRN: [id number] Date of Birth: Apr 03, 1934  Date of referral:  08/10/15               Reason for consult:  Facility Placement                Permission sought to share information with:    Permission granted to share information::     Name::        Agency::     Relationship::     Contact Information:     Housing/Transportation Living arrangements for the past 2 months:  Skilled Nursing Facility Source of Information:  Facility Patient Interpreter Needed:  None Criminal Activity/Legal Involvement Pertinent to Current Situation/Hospitalization:  No - Comment as needed Significant Relationships:   (neiceOrson Slick: [phone]) Lives with:  Facility Resident Do you feel safe going back to the place where you live?    Need for family participation in patient care:     Care giving concerns:  Patient is a long term resident at [facility].   Social Worker assessment / plan:  Patient seen and assessed just mid last month and now is readmitted. [facility] is able to take patient back at discharge. [name] niece: [name] confirmed at that time that family wishes for patient to return when time. FL2 completed and placed in chart.  Employment status:  Retired Database administrator PT Recommendations:    Information / Referral to community resources:     Patient/Family's Response to care:  none  Patient/Family's Understanding of and Emotional Response to Diagnosis, Current Treatment, and Prognosis:  none  Emotional Assessment Appearance:  Appears stated age Attitude/Demeanor/Rapport:    Affect (typically observed):    Orientation:    Alcohol / Substance use:  Not Applicable Psych involvement (Current and /or in the community):  No (Comment)  Discharge Needs  Concerns to be addressed:  Care Coordination Readmission within the last 30 days:  No Current discharge  risk:  None Barriers to Discharge:  No Barriers Identified   York Spaniel, LCSW 08/10/2015, 3:25 PM

## 2015-08-10 NOTE — Progress Notes (Addendum)
[facility] - [place] at [facility]   PATIENT NAME: Sean Delgado    MR#:  [id number]  DATE OF BIRTH:  [birth year]  SUBJECTIVE:  CHIEF COMPLAINT:   Chief Complaint  Patient presents with  . Shortness of Breath   -Patient admitted from rehabilitation place due to hypotension and cough. -Chest x-ray with bibasilar airspace disease -Patient was hypotensive requiring pressors up until this morning. He was on nonrebreather and now weaned down to 2 L nasal cannula. Still has significant cough.  REVIEW OF SYSTEMS:  Review of Systems  Constitutional: Positive for malaise/fatigue. Negative for fever and chills.  Respiratory: Positive for cough and shortness of breath. Negative for wheezing.   Cardiovascular: Negative for chest pain and palpitations.  Gastrointestinal: Negative for nausea, vomiting, abdominal pain, diarrhea and constipation.  Genitourinary: Negative for dysuria.  Musculoskeletal: Positive for myalgias.  Neurological: Negative for dizziness, seizures and headaches.  Psychiatric/Behavioral: Negative for depression.    DRUG ALLERGIES:  No Known Allergies  VITALS:  Blood pressure 104/40, pulse 74, temperature 98.4 F (36.9 C), temperature source Axillary, resp. rate 25, height  (1.854 m), weight 96.7 kg (213 lb 3 oz), SpO2 96 %.  PHYSICAL EXAMINATION:  Physical Exam  GENERAL:  82 y.o.-year-old patient lying in the bed with no acute distress.  EYES: Pupils equal, round, reactive to light and accommodation. No scleral icterus. Extraocular muscles intact.  HEENT: Head atraumatic, normocephalic. Oropharynx and nasopharynx clear.  NECK:  Supple, no jugular venous distention. No thyroid enlargement, no tenderness.  LUNGS: Normal breath sounds bilaterally, no wheezing, rhonchi or crepitation. No use of accessory muscles of respiration. Fine bibasilar crackles. Decreased bibasilar breath sounds. CARDIOVASCULAR: S1, S2 normal. No murmurs, rubs, or  gallops.  ABDOMEN: Soft, nontender, nondistended. Bowel sounds present. No organomegaly or mass.  EXTREMITIES: No pedal edema, cyanosis, or clubbing.  NEUROLOGIC: Cranial nerves II through XII are intact. Muscle strength 5/5 in all extremities. Sensation intact. Gait not checked.  PSYCHIATRIC: The patient is alert and oriented x 3.  SKIN: No obvious rash, lesion, or ulcer.    LABORATORY PANEL:   CBC  Recent Labs Lab 08/10/15 0701  WBC 9.2  HGB 8.6*  HCT 29.3*  PLT 247   ------------------------------------------------------------------------------------------------------------------  Chemistries   Recent Labs Lab 08/08/15 2207  08/09/15 1710 08/10/15 0859  NA 140  < >  --  139  K 3.1*  < >  --  5.0  CL 97*  < >  --  105  CO2 33*  < >  --  25  GLUCOSE 104*  < >  --  88  BUN 12  < >  --  21*  CREATININE 3.08*  < >  --  4.24*  CALCIUM 7.5*  < >  --  7.6*  MG  --   --  1.7  --   AST 15  --   --   --   ALT 8*  --   --   --   ALKPHOS 68  --   --   --   BILITOT 0.6  --   --   --   < > = values in this interval not displayed. ------------------------------------------------------------------------------------------------------------------  Cardiac Enzymes  Recent Labs Lab 08/08/15 2207  TROPONINI 0.05*   ------------------------------------------------------------------------------------------------------------------  RADIOLOGY:  Dg Chest Port 1 View  08/08/2015  CLINICAL DATA:  Sepsis EXAM: PORTABLE CHEST 1 VIEW COMPARISON:  08/04/2015 FINDINGS: Dialysis catheter on the right tip at the upper right atrium.  There is bibasilar airspace disease, most dense in the right middle lobe. No effusion or cavitation. No pulmonary edema. Cardiomegaly. IMPRESSION: Bibasilar airspace disease consistent with pneumonia given the history. Electronically Signed   By: Marnee Spring M.D.   On: 08/08/2015 22:19    EKG:   Orders placed or performed during the hospital encounter of  08/08/15  . ED EKG 12-Lead  . ED EKG 12-Lead  . EKG 12-Lead  . EKG 12-Lead    ASSESSMENT AND PLAN:   82 year old male with past medical history significant for hypertension, congestive heart failure, coronary artery disease, atrial fibrillation, end-stage renal disease on hemodialysis presents to the hospital secondary to hypoxemia and also hypertension.  #1 acute hypoxic respiratory failure-secondary to bilateral pneumonia -Chest x-ray with bibasilar pneumonia. Patient was on nonrebreather mask, now down to 2 L nasal cannula. - repeat chest x-ray today. -Continue antibiotics with vancomycin and cefepime. -Blood cultures have remained negative. We'll discontinue vancomycin tomorrow.  #2 sepsis-secondary to pneumonia. -Low normal blood pressure at baseline. Required Levophed Until this afternoon. -Start Midodrine for now  #3 end-stage renal disease on hemodialysis-appreciate nephrology consult. Patient getting dialysis per schedule. Next dialysis today but had to be held and we'll do dialysis tomorrow  #4 hypertension-metoprolol and Cardizem are on hold due to hypotension. Was also taking Imdur at home which is on hold as well. -Low dose metoprolol started today to help with his A. fib  #5 recent C. difficile colitis-continue Flagyl at this time. No diarrhea noted.  #6 coronary artery disease-stable at this time. No chest pain. Continue aspirin and statin. Low-dose metoprolol will be started. Also on Plavix  #7 DVT prophylaxis-on subcutaneous heparin   All the records are reviewed and case discussed with Care Management/Social Workerr. Management plans discussed with the patient, family and they are in agreement.  CODE STATUS: Full Code  TOTAL CRITICAL CARE  TIME SPENT IN TAKING CARE OF THIS PATIENT: 38 minutes.   POSSIBLE D/C IN 2 DAYS, DEPENDING ON CLINICAL CONDITION.   Enid Baas M.D on 08/10/2015 at 5:33 PM  Between 7am to 6pm - Pager - [phone]  After 6pm  go to [email or web address] - password [email or web address] [facility]  [facility]  Office  [phone]  CC: Primary care physician; Bobbye Morton, MD

## 2015-08-10 NOTE — Care Management Note (Signed)
Patient is active at [facility] [address] on TTS schedule.  Admission records have been sent to [facility] intake and will update with additional records at discharge.  Ivor Reining Dialysis Liaison  [phone]

## 2015-08-10 NOTE — Consult Note (Addendum)
[facility] [place] Critical [facility] Medicine Consultation     ASSESSMENT/PLAN    PULMONARY Acute hypoxic respiratory failure, uncertain etiology, however, given his rapid improvement. He may have had an an episode of aspiration pneumonitis, or acute/ pulmonary edema, complicating pneumonia. Chest x-ray from provider first 2017 was reviewed, consistent with right lower lobe pneumonia -Continue dialysis per nephrology.  -Suspect recurrent aspiration, palliative care note from 1/26 noted, at that time it was noted the patient stated that he did not want a feeding tube. I discussed with the patient in the presence of his daughter the possibility of placing a feeding tube. He said he would like to have one, but he would likely want to have a feeding tube in order to stay alive.Marland Kitchen  CARDIOVASCULAR Septic shock with hypotension  -Continue antibiotics. -Wean off the Levophed.   RENAL End-stage renal disease on hemodialysis. -Continue hemodialysis per nephrology.  GASTROINTESTINAL A: Dysphagia with aspiration. -Continue dysphagia 1 diet with honey thick liquids  HEMATOLOGIC A:  --  INFECTIOUS A:  Pneumonia with septic shock.    Social: -Patient was seen by palliative care service, on previous admission. Was noted to be an dysphagia 1 with honey thick liquids. At that time it was also noted that he stated that he did not want a feeding tube, and that he was a [facility] patient. ---------------------------------------  ---------------------------------------   Name: Sean Delgado. MRN: [id number] DOB: 1934/06/02    ADMISSION DATE:  08/08/2015 CONSULTATION DATE:  08/09/15   REFERRING MD :  Dr. Sheryle Hail  CHIEF COMPLAINT:  Dyspnea.     HISTORY OF PRESENT ILLNESS:    On my initial evaluation, the patient was fairly sleepy and was not able to provide a history, therefore, all history was obtained from the chart and from staff.  Sean Delgado is a 82 y.o. male with a known history of end-stage  renal disease on hemodialysis, hypertension, CHF, glaucoma, neuropathy, stroke, peripheral vascular disease, coronary artery disease, A. fib, right eye blindness- lives in a nursing home  He was recently in the hospital approximately 2 weeks ago with aspiration pneumonia and sepsis. He was brought back to the ER due to hypoxia, he was requiring 4 L of oxygen and oxygen saturation was only a 60%, he was noted to have significant cough and dyspnea. He apparently became hypoxic during his dialysis treatment. Subsequently he was admitted to the intensive care unit where he was started on oxygen at 6 L nasal cannula. He was started on a low-dose of the Levophed at 3 mics yesterday due to mild hypotension, this has subsequently been weaned down this morning. In addition, his oxygen saturations have improved.  PAST MEDICAL HISTORY :  Past Medical History  Diagnosis Date  . ESRD (end stage renal disease) (HCC)   . Hypertension   . CHF (congestive heart failure) (HCC)   . Gout   . Glaucoma   . Neuropathy (HCC)   . Stroke (HCC)   . Peripheral vascular disease (HCC)   . Coronary artery disease   . A-fib (HCC)   . Dysrhythmia   . Blindness of right eye   . Anemia     iron deficiency   Past Surgical History  Procedure Laterality Date  . Vascular surgery Bilateral     Carotid Endarterectomy  . Eye surgery Left     Cataract Extraction  . Av fistula placement Left 04/06/2015    Procedure: ARTERIOVENOUS (AV) FISTULA CREATION;  Surgeon: Annice Needy, MD;  Location: [facility] ORS;  Service: Vascular;  Laterality: Left;  . Peripheral vascular catheterization N/A 04/12/2015    Procedure: Dialysis/Perma Catheter Insertion;  Surgeon: Annice Needy, MD;  Location: [facility] INVASIVE CV LAB;  Service: Cardiovascular;  Laterality: N/A;  . Peripheral vascular catheterization N/A 05/18/2015    Procedure: Dialysis/Perma Catheter Insertion;  Surgeon: Annice Needy, MD;  Location: [facility] INVASIVE CV LAB;  Service: Cardiovascular;   Laterality: N/A;  . Peripheral vascular catheterization Left 05/29/2015    Procedure: A/V Shuntogram/Fistulagram;  Surgeon: Annice Needy, MD;  Location: [facility] INVASIVE CV LAB;  Service: Cardiovascular;  Laterality: Left;  . Peripheral vascular catheterization N/A 05/29/2015    Procedure: A/V Shunt Intervention;  Surgeon: Annice Needy, MD;  Location: [facility] INVASIVE CV LAB;  Service: Cardiovascular;  Laterality: N/A;  . Peripheral vascular catheterization N/A 06/26/2015    Procedure: Dialysis/Perma Catheter Insertion;  Surgeon: Annice Needy, MD;  Location: [facility] INV[facility]IVE CV LAB;  Service: Cardiovascular;  Laterality: N/A;  . Av fistula placement Right 06/29/2015    Procedure: BRACHIAL CEPHALIC AV FISTULA;  Surgeon: Annice Needy, MD;  Location: [facility] ORS;  Service: Vascular;  Laterality: Right;  . Peripheral vascular catheterization N/A 08/02/2015    Procedure: Dialysis/Perma Catheter Insertion;  Surgeon: Renford Dills, MD;  Location: [facility] INVASIVE CV LAB;  Service: Cardiovascular;  Laterality: N/A;   Prior to Admission medications   Medication Sig Start Date End Date Taking? Authorizing Provider  acetaminophen (TYLENOL) 325 MG tablet Take 650 mg by mouth every 6 (six) hours as needed for mild pain or moderate pain. *note dose*   Yes Historical Provider, MD  allopurinol (ZYLOPRIM) 100 MG tablet Take 100 mg by mouth daily.   Yes Historical Provider, MD  aspirin EC 81 MG tablet Take 81 mg by mouth daily. Reported on 06/23/2015   Yes Historical Provider, MD  atorvastatin (LIPITOR) 20 MG tablet Take 1 tablet (20 mg total) by mouth daily. Patient taking differently: Take 20 mg by mouth at bedtime.  04/17/15  Yes Enid Baas, MD  b complex-vitamin c-folic acid (NEPHRO-VITE) 0.8 MG TABS tablet Take 1 tablet by mouth daily.   Yes Historical Provider, MD  benzonatate (TESSALON) 100 MG capsule Take 1 capsule (100 mg total) by mouth 3 (three) times daily as needed for cough. 08/04/15  Yes Auburn Bilberry, MD    brimonidine (ALPHAGAN) 0.2 % ophthalmic solution Place 1 drop into the left eye 2 (two) times daily.   Yes Historical Provider, MD  clopidogrel (PLAVIX) 75 MG tablet Take 75 mg by mouth daily.   Yes Historical Provider, MD  diltiazem (CARDIZEM CD) 180 MG 24 hr capsule Take 1 capsule (180 mg total) by mouth daily. 04/17/15  Yes Enid Baas, MD  ferrous sulfate 325 (65 FE) MG EC tablet Take 325 mg by mouth daily.   Yes Historical Provider, MD  furosemide (LASIX) 40 MG tablet Take 1 tablet (40 mg total) by mouth 2 (two) times daily. 04/17/15  Yes Enid Baas, MD  isosorbide mononitrate (IMDUR) 60 MG 24 hr tablet Take 60 mg by mouth daily.   Yes Historical Provider, MD  latanoprost (XALATAN) 0.005 % ophthalmic solution Place 1 drop into both eyes at bedtime.   Yes Historical Provider, MD  lidocaine (LIDODERM) 5 % Place 1 patch onto the skin daily. Apply to right knee and right buttock at 6am. Remove & Discard patch within 12 hours or as directed by MD   Yes Historical Provider, MD  megestrol (MEGACE) 400 MG/10ML suspension Take 10 mLs (  400 mg total) by mouth 2 (two) times daily. 08/04/15  Yes Auburn Bilberry, MD  metoprolol (TOPROL-XL) 200 MG 24 hr tablet Take 200 mg by mouth daily.   Yes Historical Provider, MD  metroNIDAZOLE (FLAGYL) 500 MG tablet Take 1 tablet (500 mg total) by mouth every 8 (eight) hours. 08/04/15 08/19/15 Yes Auburn Bilberry, MD  pregabalin (LYRICA) 75 MG capsule Take 75 mg by mouth daily.    Yes Historical Provider, MD  sodium bicarbonate 650 MG tablet Take 650 mg by mouth 2 (two) times daily.   Yes Historical Provider, MD  tolnaftate (TINACTIN) 1 % powder Apply 1 application topically daily. Apply on both feet.   Yes Historical Provider, MD   No Known Allergies  FAMILY HISTORY:  Family History  Problem Relation Age of Onset  . Diabetes Mother    SOCIAL HISTORY:  reports that he quit smoking about 8 years ago. His smoking use included Cigarettes. He has a 3.75  pack-year smoking history. He has never used smokeless tobacco. He reports that he does not drink alcohol or use illicit drugs.     VITAL SIGNS: Temp:  [98.2 F (36.8 C)-98.7 F (37.1 C)] 98.7 F (37.1 C) (02/02 0800) Pulse Rate:  [60-100] 80 (02/02 0600) Resp:  [16-24] 21 (02/02 0600) BP: (88-121)/(38-56) 109/56 mmHg (02/02 0600) SpO2:  [89 %-97 %] 91 % (02/02 0600) Weight:  [213 lb 3 oz (96.7 kg)] 213 lb 3 oz (96.7 kg) (02/02 0535) HEMODYNAMICS:   VENTILATOR SETTINGS:   INTAKE / OUTPUT:  Intake/Output Summary (Last 24 hours) at 08/10/15 1136 Last data filed at 08/10/15 0945  Gross per 24 hour  Intake 1571.53 ml  Output      0 ml  Net 1571.53 ml    Physical Examination:   VS: BP 109/56 mmHg  Pulse 80  Temp(Src) 98.7 F (37.1 C) (Oral)  Resp 21  Ht  (1.854 m)  Wt 213 lb 3 oz (96.7 kg)  BMI 28.13 kg/m2  SpO2 91%  General Appearance: No distress  Neuro:without focal findings, Mental status reduced  HEENT: PERRLA, EOM intact, no ptosis, no other lesions noticed;  Pulmonary:Decreased breath sounds bilaterally.  CardiovascularNormal S1,S2.  No m/r/g.    Abdomen: Benign, Soft, non-tender, No masses, hepatosplenomegaly, No lymphadenopathy Renal:  No costovertebral tenderness  GU:  Not performed at this time. Endoc: No evident thyromegaly, no signs of acromegaly. Skin:   warm, no rashes, no ecchymosis  Extremities: normal, no cyanosis, clubbing, no edema, warm with normal capillary refill.    LABS: Reviewed   LABORATORY PANEL:   CBC  Recent Labs Lab 08/10/15 0701  WBC 9.2  HGB 8.6*  HCT 29.3*  PLT 247    Chemistries   Recent Labs Lab 08/08/15 2207  08/09/15 1710 08/10/15 0859  NA 140  < >  --  139  K 3.1*  < >  --  5.0  CL 97*  < >  --  105  CO2 33*  < >  --  25  GLUCOSE 104*  < >  --  88  BUN 12  < >  --  21*  CREATININE 3.08*  < >  --  4.24*  CALCIUM 7.5*  < >  --  7.6*  MG  --   --  1.7  --   PHOS  --   --  4.4  --   AST 15  --    --   --   ALT 8*  --   --   --  ALKPHOS 68  --   --   --   BILITOT 0.6  --   --   --   < > = values in this interval not displayed.   Recent Labs Lab 08/09/15 0224  GLUCAP 94   No results for input(s): PHART, PCO2ART, PO2ART in the last 168 hours.  Recent Labs Lab 08/08/15 2207  AST 15  ALT 8*  ALKPHOS 68  BILITOT 0.6  ALBUMIN 2.1*    Cardiac Enzymes  Recent Labs Lab 08/08/15 2207  TROPONINI 0.05*    RADIOLOGY:  Dg Chest Port 1 View  08/08/2015  CLINICAL DATA:  Sepsis EXAM: PORTABLE CHEST 1 VIEW COMPARISON:  08/04/2015 FINDINGS: Dialysis catheter on the right tip at the upper right atrium. There is bibasilar airspace disease, most dense in the right middle lobe. No effusion or cavitation. No pulmonary edema. Cardiomegaly. IMPRESSION: Bibasilar airspace disease consistent with pneumonia given the history. Electronically Signed   By: Marnee Spring M.D.   On: 08/08/2015 22:19       --Wells Guiles, MD.  Board Certified in Internal Medicine, Pulmonary Medicine, Critical Care Medicine, and Sleep Medicine.  Pager [phone] [facility] Office Number: [phone]  Santiago Glad, M.D.  Stephanie Acre, M.D.  Billy Fischer, M.D   08/10/2015, 11:36 AM  Critical Care Attestation.  I have personally obtained a history, examined the patient, evaluated laboratory and imaging results, formulated the assessment and plan and placed orders. The Patient requires high complexity decision making for assessment and support, frequent evaluation and titration of therapies, application of advanced monitoring technologies and extensive interpretation of multiple databases. The patient has critical illness that could lead imminently to failure of 1 or more organ systems and requires the highest level of physician preparedness to intervene.  Critical Care Time devoted to patient care services described in this note is 35 minutes and is exclusive of time spent in  procedures.

## 2015-08-10 NOTE — Progress Notes (Signed)
Upon assessment of dialysis catheter,no blood return noted with aspiration on either dialysis lumen,but both lumens` flushed well.Alteplase (1.23ml per port) was instilled to both lumens for dwell to see if catheter function improves.After dwell catheter was attempted again,arterial port still would not aspirate and venous port aspirated very sluggish with air bubbles.Dr.Singh was called and notified of poor catheter function,order was given to cathflo patient with overnight dwell of  Alteplase per lumen and reattempt dialysis tomorrow.Patient tolerated well,no adverse effects noted.Alteplase placed for overnight dwell,primary nurse made aware.Westley Hummer [place],RN)

## 2015-08-10 NOTE — Progress Notes (Signed)
Subjective:   Patient known to our practice from previous admission His last dialysis was Thursday He was sent from the dialysis center because of shortness of breath He reports cough No fevers or chills Required IV Levophed administration overnight This morning pressors on hold. Blood pressure greater than 120 systolic    Objective:  Vital signs in last 24 hours:  Temp:  [98.2 F (36.8 C)-98.7 F (37.1 C)] 98.7 F (37.1 C) (02/02 0800) Pulse Rate:  [60-100] 80 (02/02 0600) Resp:  [16-24] 21 (02/02 0600) BP: (88-121)/(38-56) 109/56 mmHg (02/02 0600) SpO2:  [89 %-97 %] 91 % (02/02 0600) Weight:  [96.7 kg (213 lb 3 oz)] 96.7 kg (213 lb 3 oz) (02/02 0535)  Weight change: 4 kg (8 lb 13.1 oz) Filed Weights   08/09/15 0242 08/10/15 0535  Weight: 92.7 kg (204 lb 5.9 oz) 96.7 kg (213 lb 3 oz)    Intake/Output:    Intake/Output Summary (Last 24 hours) at 08/10/15 1103 Last data filed at 08/10/15 0945  Gross per 24 hour  Intake 1671.53 ml  Output      0 ml  Net 1671.53 ml     Physical Exam: General:  no acute distress, laying in the bed   HEENT  anicteric, moist oral mucous membranes , right eye blindness  Neck  supple   Pulm/lungs  coarse breath sounds, normal effort  CVS/Heart  S1S2 irregular  Abdomen:   soft, nontender, nondistended   Extremities:  no peripheral edema   Neurologic:  alert, oriented, follows commands  Skin:  no acute rashes   Access: RIJ permcath 08/02/15 Dr. Gilda Crease. Right AVF maturing with bruit and thrill       Basic Metabolic Panel:   Recent Labs Lab 08/08/15 2207 08/09/15 0953 08/09/15 1710 08/10/15 0859  NA 140 139  --  139  K 3.1* 3.2*  --  5.0  CL 97* 101  --  105  CO2 33* 28  --  25  GLUCOSE 104* 94  --  88  BUN 12 15  --  21*  CREATININE 3.08* 3.45*  --  4.24*  CALCIUM 7.5* 7.4*  --  7.6*  MG  --   --  1.7  --   PHOS  --   --  4.4  --      CBC:  Recent Labs Lab 08/08/15 2207 08/09/15 0953 08/10/15 0701  WBC 12.3*  8.9 9.2  NEUTROABS 9.8*  --   --   HGB 9.1* 9.9* 8.6*  HCT 30.4* 33.5* 29.3*  MCV 86.0 88.4 90.4  PLT 293 260 247      Microbiology:  Recent Results (from the past 720 hour(s))  Culture, blood (routine x 2)     Status: None   Collection Time: 07/22/15  6:21 PM  Result Value Ref Range Status   Specimen Description BLOOD LEFT HAND  Final   Special Requests BOTTLES DRAWN AEROBIC AND ANAEROBIC  4CC  Final   Culture  Setup Time   Final    GRAM POSITIVE COCCI IN CLUSTERS AEROBIC BOTTLE ONLY CRITICAL RESULT CALLED TO, READ BACK BY AND VERIFIED WITH: CRYSTAL SCARPENA 07/23/15 1330 MLM    Culture   Final    METHICILLIN RESISTANT STAPHYLOCOCCUS AUREUS AEROBIC BOTTLE ONLY    Report Status 07/27/2015 FINAL  Final   Organism ID, Bacteria METHICILLIN RESISTANT STAPHYLOCOCCUS AUREUS  Final      Susceptibility   Methicillin resistant staphylococcus aureus - MIC*    CIPROFLOXACIN >=8 RESISTANT Resistant  ERYTHROMYCIN <=0.25 SENSITIVE Sensitive     GENTAMICIN <=0.5 SENSITIVE Sensitive     OXACILLIN >=4 RESISTANT Resistant     VANCOMYCIN 1 SENSITIVE Sensitive     TRIMETH/SULFA <=10 SENSITIVE Sensitive     CLINDAMYCIN <=0.25 SENSITIVE Sensitive     CEFOXITIN SCREEN Value in next row Resistant      POSITIVECEFOXITIN SCREEN - This test may be used to predict mecA-mediated oxacillin resistance, and it is based on the cefoxitin disk screen test.  The cefoxitin screen and oxacillin work in combination to determine the final interpretation reported for oxacillin.     Inducible Clindamycin Value in next row Sensitive      POSITIVECEFOXITIN SCREEN - This test may be used to predict mecA-mediated oxacillin resistance, and it is based on the cefoxitin disk screen test.  The cefoxitin screen and oxacillin work in combination to determine the final interpretation reported for oxacillin.     TETRACYCLINE Value in next row Sensitive      SENSITIVE<=1    * METHICILLIN RESISTANT STAPHYLOCOCCUS AUREUS   Culture, blood (routine x 2)     Status: None   Collection Time: 07/22/15  6:21 PM  Result Value Ref Range Status   Specimen Description BLOOD LEFT HAND  Final   Special Requests   Final    BOTTLES DRAWN AEROBIC AND ANAEROBIC  AER 2CC ANA 4CC   Culture  Setup Time   Final    GRAM POSITIVE COCCI IN CLUSTERS IN BOTH AEROBIC AND ANAEROBIC BOTTLES CRITICAL RESULT CALLED TO, READ BACK BY AND VERIFIED WITH: CRYSTAL SCARPENA 07/23/15 1400 MLM    Culture METHICILLIN RESISTANT STAPHYLOCOCCUS AUREUS  Final   Report Status 07/27/2015 FINAL  Final   Organism ID, Bacteria METHICILLIN RESISTANT STAPHYLOCOCCUS AUREUS  Final      Susceptibility   Methicillin resistant staphylococcus aureus - MIC*    CIPROFLOXACIN >=8 RESISTANT Resistant     ERYTHROMYCIN 0.5 SENSITIVE Sensitive     GENTAMICIN <=0.5 SENSITIVE Sensitive     OXACILLIN >=4 RESISTANT Resistant     VANCOMYCIN 1 SENSITIVE Sensitive     TRIMETH/SULFA <=10 SENSITIVE Sensitive     CLINDAMYCIN <=0.25 SENSITIVE Sensitive     CEFOXITIN SCREEN Value in next row Resistant      POSITIVECEFOXITIN SCREEN - This test may be used to predict mecA-mediated oxacillin resistance, and it is based on the cefoxitin disk screen test.  The cefoxitin screen and oxacillin work in combination to determine the final interpretation reported for oxacillin.     Inducible Clindamycin Value in next row Sensitive      POSITIVECEFOXITIN SCREEN - This test may be used to predict mecA-mediated oxacillin resistance, and it is based on the cefoxitin disk screen test.  The cefoxitin screen and oxacillin work in combination to determine the final interpretation reported for oxacillin.     TETRACYCLINE Value in next row Sensitive      SENSITIVE<=1    * METHICILLIN RESISTANT STAPHYLOCOCCUS AUREUS  Blood Culture ID Panel (Reflexed)     Status: Abnormal   Collection Time: 07/22/15  6:21 PM  Result Value Ref Range Status   Enterococcus species NOT DETECTED NOT DETECTED Final    Listeria monocytogenes NOT DETECTED NOT DETECTED Final   Staphylococcus species DETECTED (A) NOT DETECTED Final   Staphylococcus aureus DETECTED (A) NOT DETECTED Final    Comment: CRITICAL RESULT CALLED TO, READ BACK BY AND VERIFIED WITH: CRYSTAL SCARPENA 07/23/15 1330 ABOUT STAPH SPECIES,STAPH AUREUS, AND MecA DETECTED MLM  Streptococcus species NOT DETECTED NOT DETECTED Final   Streptococcus agalactiae NOT DETECTED NOT DETECTED Final   Streptococcus pneumoniae NOT DETECTED NOT DETECTED Final   Streptococcus pyogenes NOT DETECTED NOT DETECTED Final   Acinetobacter baumannii NOT DETECTED NOT DETECTED Final   Enterobacteriaceae species NOT DETECTED NOT DETECTED Final   Enterobacter cloacae complex NOT DETECTED NOT DETECTED Final   Escherichia coli NOT DETECTED NOT DETECTED Final   Klebsiella oxytoca NOT DETECTED NOT DETECTED Final   Klebsiella pneumoniae NOT DETECTED NOT DETECTED Final   Proteus species NOT DETECTED NOT DETECTED Final   Serratia marcescens NOT DETECTED NOT DETECTED Final   Haemophilus influenzae NOT DETECTED NOT DETECTED Final   Neisseria meningitidis NOT DETECTED NOT DETECTED Final   Pseudomonas aeruginosa NOT DETECTED NOT DETECTED Final   Candida albicans NOT DETECTED NOT DETECTED Final   Candida glabrata NOT DETECTED NOT DETECTED Final   Candida krusei NOT DETECTED NOT DETECTED Final   Candida parapsilosis NOT DETECTED NOT DETECTED Final   Candida tropicalis NOT DETECTED NOT DETECTED Final   Carbapenem resistance NOT DETECTED NOT DETECTED Final   Methicillin resistance DETECTED (A) NOT DETECTED Final   Vancomycin resistance NOT DETECTED NOT DETECTED Final  MRSA PCR Screening     Status: Abnormal   Collection Time: 07/23/15  5:24 AM  Result Value Ref Range Status   MRSA by PCR POSITIVE (A) NEGATIVE Final    Comment:        The GeneXpert MRSA Assay (FDA approved for NASAL specimens only), is one component of a comprehensive MRSA colonization surveillance  program. It is not intended to diagnose MRSA infection nor to guide or monitor treatment for MRSA infections. CRITICAL RESULT CALLED TO, READ BACK BY AND VERIFIED WITH: ALISHA BABB ON 07/23/15 AT 0735 BY QSD   CULTURE, BLOOD (ROUTINE X 2) w Reflex to PCR ID Panel     Status: None   Collection Time: 07/25/15  6:08 AM  Result Value Ref Range Status   Specimen Description BLOOD LEFT AC  Final   Special Requests BOTTLES DRAWN AEROBIC AND ANAEROBIC ANA2ML AER3ML  Final   Culture NO GROWTH 5 DAYS  Final   Report Status 07/30/2015 FINAL  Final  CULTURE, BLOOD (ROUTINE X 2) w Reflex to PCR ID Panel     Status: None   Collection Time: 07/25/15  6:08 AM  Result Value Ref Range Status   Specimen Description BLOOD LEFT WRIST  Final   Special Requests   Final    BOTTLES DRAWN AEROBIC AND ANAEROBIC ANA AER   Culture NO GROWTH 5 DAYS  Final   Report Status 07/30/2015 FINAL  Final  Cath Tip Culture     Status: None   Collection Time: 07/28/15  7:32 PM  Result Value Ref Range Status   Specimen Description CATH TIP  Final   Special Requests NONE  Final   Culture NO GROWTH 3 DAYS  Final   Report Status 07/31/2015 FINAL  Final  C difficile quick scan w PCR reflex     Status: Abnormal   Collection Time: 07/31/15 10:28 AM  Result Value Ref Range Status   C Diff antigen POSITIVE (A) NEGATIVE Final   C Diff toxin NEGATIVE NEGATIVE Final   C Diff interpretation   Final    Positive for toxigenic C. difficile, active toxin production not detected. Patient has toxigenic C. difficile organisms present in the bowel, but toxin was not detected. The patient may be a carrier or the level  of toxin in the sample was below the limit  of detection. This information should be used in conjunction with the patient's clinical history when deciding on possible therapy.   Clostridium Difficile by PCR     Status: Abnormal   Collection Time: 07/31/15 10:28 AM  Result Value Ref Range Status   Toxigenic C  Difficile by pcr POSITIVE (A) NEGATIVE Final    Comment: CRITICAL RESULT CALLED TO, READ BACK BY AND VERIFIED WITH: TAYLOR BECK 07/31/15 AT 1211 VKB   Blood Culture (routine x 2)     Status: None (Preliminary result)   Collection Time: 08/08/15 10:08 PM  Result Value Ref Range Status   Specimen Description BLOOD LEFT ARM  Final   Special Requests BOTTLES DRAWN AEROBIC AND ANAEROBIC  Final   Culture NO GROWTH 2 DAYS  Final   Report Status PENDING  Incomplete  Blood Culture (routine x 2)     Status: None (Preliminary result)   Collection Time: 08/08/15 10:20 PM  Result Value Ref Range Status   Specimen Description BLOOD LEFT ANTECUBITAL  Final   Special Requests   Final    BOTTLES DRAWN AEROBIC AND ANAEROBIC ,   Culture NO GROWTH 2 DAYS  Final   Report Status PENDING  Incomplete    Coagulation Studies:  Recent Labs  08/08/15 2207  LABPROT 15.9*  INR 1.26    Urinalysis: No results for input(s): COLORURINE, LABSPEC, PHURINE, GLUCOSEU, HGBUR, BILIRUBINUR, KETONESUR, PROTEINUR, UROBILINOGEN, NITRITE, LEUKOCYTESUR in the last 72 hours.  Invalid input(s): APPERANCEUR    Imaging: Dg Chest Port 1 View  08/08/2015  CLINICAL DATA:  Sepsis EXAM: PORTABLE CHEST 1 VIEW COMPARISON:  08/04/2015 FINDINGS: Dialysis catheter on the right tip at the upper right atrium. There is bibasilar airspace disease, most dense in the right middle lobe. No effusion or cavitation. No pulmonary edema. Cardiomegaly. IMPRESSION: Bibasilar airspace disease consistent with pneumonia given the history. Electronically Signed   By: Marnee Spring M.D.   On: 08/08/2015 22:19     Medications:   . norepinephrine (LEVOPHED) Adult infusion 1 mcg/min (08/10/15 0838)   . allopurinol  100 mg Oral Daily  . antiseptic oral rinse  7 mL Mouth Rinse BID  . aspirin EC  81 mg Oral Daily  . atorvastatin  20 mg Oral QHS  . brimonidine  1 drop Left Eye BID  . ceFEPime (MAXIPIME) IV  2 g  Intravenous Q T,Th,Sa-HD  . clopidogrel  75 mg Oral Daily  . docusate sodium  100 mg Oral BID  . ferrous sulfate  325 mg Oral Daily  . heparin  5,000 Units Subcutaneous 3 times per day  . latanoprost  1 drop Both Eyes QHS  . lidocaine  1 patch Transdermal Daily  . lidocaine  1 patch Transdermal Q24H  . megestrol  400 mg Oral BID  . metoprolol tartrate  12.5 mg Oral BID  . metroNIDAZOLE  500 mg Oral 3 times per day  . pneumococcal 23 valent vaccine  0.5 mL Intramuscular Tomorrow-1000  . pregabalin  75 mg Oral Daily  . sodium bicarbonate  650 mg Oral BID  . sodium chloride flush  3 mL Intravenous Q12H  . tolnaftate  1 application Topical Daily  . vancomycin  1,000 mg Intravenous Q T,Th,Sa-HD   acetaminophen **OR** acetaminophen, benzonatate, morphine injection, ondansetron **OR** ondansetron (ZOFRAN) IV  Assessment/ Plan:  82 y.o. black male  with right eye blindness, diastolic congestive heart failure, hypertension, gout, glaucoma, peripheral neuropathy, atrial  fibrillation, anemia, carotid stenosis status post bilateral CEA, CVA, peripheral vascular disease   [facility]/ TTS  1. End Stage Renal Disease: tolerating treatment well. Continue TTS schedule.  - will plan on dialysis today - Midodrine prior to dialysis   2. Anemia of chronic kidney disease: Hemoglobin 9.9 - continue epogen IV with HD.   3. Secondary Hyperparathyroidism:  - Phosphorus during this hospitalization  4. HTN Antihypertensives are being held Consider continuing low-dose metoprolol to prevent abrupt withdrawal of medication   LOS: 1 Sean Delgado 2/2/201711:03 AM

## 2015-08-10 NOTE — Progress Notes (Signed)
Pharmacy Antibiotic Note  Sean Delgado. is a 82 y.o. male admitted on 08/08/2015 with pneumonia.  Pharmacy has been consulted for cefepime and vancomycin dosing.  Plan: Continue cefepime 2 gm IV Q48H (after each dialysis) and vancomycin 1 gm IV Q48H (after each dialysis). Vancomycin pre-HD level scheduled 02/07.    Height:  (185.4 cm) Weight: 213 lb 3 oz (96.7 kg) IBW/kg (Calculated) : 79.9  Temp (24hrs), Avg:98.4 F (36.9 C), Min:98 F (36.7 C), Max:98.7 F (37.1 C)   Recent Labs Lab 08/08/15 2207 08/09/15 0953 08/10/15 0701 08/10/15 0859  WBC 12.3* 8.9 9.2  --   CREATININE 3.08* 3.45*  --  4.24*  LATICACIDVEN 1.6 2.4*  --   --     Estimated Creatinine Clearance: 17.3 mL/min (by C-G formula based on Cr of 4.24).    No Known Allergies   Antibiotics:  Vancomycin 01/31 >> Cefepime 02/01 >> Metronidazole 02/01 >>  Microbiology:  01/31 Blood cx: NTD    Thank you for allowing pharmacy to be a part of this patient's care.  Luisa Hart D, Pharm.D., BCPS Clinical Pharmacist 08/10/2015 1:56 PM

## 2015-08-10 NOTE — Progress Notes (Signed)
Pre-hd tx 

## 2015-08-10 NOTE — Progress Notes (Signed)
pRE-HD TX 

## 2015-08-10 NOTE — NC FL2 (Signed)
[place] MEDICAID FL2 LEVEL OF CARE SCREENING TOOL     IDENTIFICATION  Patient Name: Sean Delgado. Birthdate: 03-09-1934 Sex: male Admission Date (Current Location): 08/08/2015  [place] and [place] Number:  Chiropodist and Address:  [facility], [address] [place], [place] [address]      Provider Number: [id number]  Attending Physician Name and Address:  Enid Baas, MD  Relative Name and Phone Number:       Current Level of Care: Hospital Recommended Level of Care: Skilled Nursing Facility Prior Approval Number:    Date Approved/Denied:   PASRR Number:    Discharge Plan: SNF    Current Diagnoses: Patient Active Problem List   Diagnosis Date Noted  . Aspiration pneumonia of both lungs (HCC)   . Malnutrition of moderate degree 07/25/2015  . Aspiration pneumonia (HCC) 07/22/2015  . Hemodialysis catheter dysfunction (HCC) 06/24/2015  . Pressure ulcer 05/31/2015  . Sepsis (HCC) 05/30/2015  . Hypoxia 05/30/2015  . Acute respiratory failure (HCC) 04/08/2015  . CHF (congestive heart failure) (HCC) 03/04/2015    Orientation RESPIRATION BLADDER Height & Weight          Incontinent Weight: 213 lb 3 oz (96.7 kg) Height:   (185.4 cm)  BEHAVIORAL SYMPTOMS/MOOD NEUROLOGICAL BOWEL NUTRITION STATUS   (none)  (none) Incontinent    AMBULATORY STATUS COMMUNICATION OF NEEDS Skin     Verbally                         Personal Care Assistance Level of Assistance    Bathing Assistance: Maximum assistance   Dressing Assistance: Maximum assistance     Functional Limitations Info             SPECIAL CARE FACTORS FREQUENCY                       Contractures Contractures Info: Not present    Additional Factors Info  Code Status, Isolation Precautions Code Status Info: partial code       Isolation Precautions Info: MRSA/ENTERIC     Current Medications (08/10/2015):  This is the current hospital active  medication list Current Facility-Administered Medications  Medication Dose Route Frequency Provider Last Rate Last Dose  . acetaminophen (TYLENOL) tablet 650 mg  650 mg Oral Q6H PRN Arnaldo Natal, MD       Or  . acetaminophen (TYLENOL) suppository 650 mg  650 mg Rectal Q6H PRN Arnaldo Natal, MD      . allopurinol (ZYLOPRIM) tablet 100 mg  100 mg Oral Daily Arnaldo Natal, MD   100 mg at 08/10/15 1014  . alteplase (CATHFLO ACTIVASE) injection 4 mg  4 mg Intracatheter Continuous PRN Mosetta Pigeon, MD   4 mg at 08/10/15 1328  . antiseptic oral rinse (CPC / CETYLPYRIDINIUM CHLORIDE 0.05%) solution 7 mL  7 mL Mouth Rinse BID Arnaldo Natal, MD   7 mL at 08/10/15 1204  . aspirin EC tablet 81 mg  81 mg Oral Daily Arnaldo Natal, MD   81 mg at 08/10/15 1012  . atorvastatin (LIPITOR) tablet 20 mg  20 mg Oral QHS Arnaldo Natal, MD   20 mg at 08/09/15 2216  . benzonatate (TESSALON) capsule 100 mg  100 mg Oral TID PRN Arnaldo Natal, MD      . brimonidine (ALPHAGAN) 0.2 % ophthalmic solution 1 drop  1 drop Left Eye BID Kelton Pillar  Sheryle Hail, MD   1 drop at 08/10/15 1011  . ceFEPIme (MAXIPIME) 2 g in dextrose 5 % 50 mL IVPB  2 g Intravenous Q T,Th,Sa-HD Sharyn Creamer, MD      . clopidogrel (PLAVIX) tablet 75 mg  75 mg Oral Daily Arnaldo Natal, MD   75 mg at 08/10/15 1013  . docusate sodium (COLACE) capsule 100 mg  100 mg Oral BID Arnaldo Natal, MD   100 mg at 08/10/15 1013  . epoetin alfa (EPOGEN,PROCRIT) injection 4,000 Units  4,000 Units Intravenous Q T,Th,Sa-HD Mosetta Pigeon, MD      . ferrous sulfate tablet 325 mg  325 mg Oral Daily Arnaldo Natal, MD   325 mg at 08/10/15 1013  . heparin injection 5,000 Units  5,000 Units Subcutaneous 3 times per day Arnaldo Natal, MD   5,000 Units at 08/10/15 1505  . latanoprost (XALATAN) 0.005 % ophthalmic solution 1 drop  1 drop Both Eyes QHS Arnaldo Natal, MD   1 drop at 08/09/15 2231  . lidocaine (LIDODERM) 5 % 1 patch  1 patch  Transdermal Daily Shaune Pollack, MD   1 patch at 08/10/15 1014  . lidocaine (LIDODERM) 5 % 1 patch  1 patch Transdermal Q24H Shaune Pollack, MD   1 patch at 08/10/15 1529  . megestrol (MEGACE) 400 MG/10ML suspension 400 mg  400 mg Oral BID Arnaldo Natal, MD   400 mg at 08/10/15 1013  . metoprolol tartrate (LOPRESSOR) tablet 12.5 mg  12.5 mg Oral BID Mosetta Pigeon, MD   12.5 mg at 08/09/15 1658  . metroNIDAZOLE (FLAGYL) tablet 500 mg  500 mg Oral 3 times per day Arnaldo Natal, MD   500 mg at 08/10/15 1505  . midodrine (PROAMATINE) tablet 10 mg  10 mg Oral Daily PRN Mosetta Pigeon, MD      . morphine 2 MG/ML injection 1 mg  1 mg Intravenous Q3H PRN Arnaldo Natal, MD   1 mg at 08/10/15 1213  . norepinephrine (LEVOPHED) 4 mg in dextrose 5 % 250 mL (0.016 mg/mL) infusion  0-40 mcg/min Intravenous Titrated Shaune Pollack, MD 3.8 mL/hr at 08/10/15 0838 1 mcg/min at 08/10/15 4098  . ondansetron (ZOFRAN) tablet 4 mg  4 mg Oral Q6H PRN Arnaldo Natal, MD       Or  . ondansetron [facility]) injection 4 mg  4 mg Intravenous Q6H PRN Arnaldo Natal, MD      . pneumococcal 23 valent vaccine (PNU-IMMUNE) injection 0.5 mL  0.5 mL Intramuscular Tomorrow-1000 Arnaldo Natal, MD   0.5 mL at 08/10/15 1159  . pregabalin (LYRICA) capsule 75 mg  75 mg Oral Daily Arnaldo Natal, MD   75 mg at 08/10/15 1013  . sodium chloride flush (NS) 0.9 % injection 3 mL  3 mL Intravenous Q12H Arnaldo Natal, MD   3 mL at 08/10/15 1203  . tolnaftate (TINACTIN) 1 % powder 1 application  1 application Topical Daily Arnaldo Natal, MD   1 application at 08/10/15 1203  . vancomycin (VANCOCIN) IVPB 1000 mg/200 mL premix  1,000 mg Intravenous Q T,Th,Sa-HD Arnaldo Natal, MD   1,000 mg at 08/10/15 1506     Discharge Medications: Please see discharge summary for a list of discharge medications.  Relevant Imaging Results:  Relevant Lab Results:   Additional Information    York Spaniel, LCSW

## 2015-08-11 ENCOUNTER — Inpatient Hospital Stay: Payer: Medicare (Managed Care)

## 2015-08-11 DIAGNOSIS — J189 Pneumonia, unspecified organism: Secondary | ICD-10-CM | POA: Insufficient documentation

## 2015-08-11 LAB — CBC
HEMATOCRIT: 25.7 % — AB (ref 40.0–52.0)
HEMOGLOBIN: 7.7 g/dL — AB (ref 13.0–18.0)
MCH: 26.6 pg (ref 26.0–34.0)
MCHC: 29.9 g/dL — ABNORMAL LOW (ref 32.0–36.0)
MCV: 89.1 fL (ref 80.0–100.0)
Platelets: 221 10*3/uL (ref 150–440)
RBC: 2.89 MIL/uL — AB (ref 4.40–5.90)
RDW: 21.9 % — AB (ref 11.5–14.5)
WBC: 9.2 10*3/uL (ref 3.8–10.6)

## 2015-08-11 LAB — BASIC METABOLIC PANEL
ANION GAP: 11 (ref 5–15)
BUN: 26 mg/dL — AB (ref 6–20)
CALCIUM: 7.5 mg/dL — AB (ref 8.9–10.3)
CO2: 24 mmol/L (ref 22–32)
Chloride: 104 mmol/L (ref 101–111)
Creatinine, Ser: 4.78 mg/dL — ABNORMAL HIGH (ref 0.61–1.24)
GFR calc Af Amer: 12 mL/min — ABNORMAL LOW (ref >60)
GFR calc non Af Amer: 10 mL/min — ABNORMAL LOW (ref >60)
GLUCOSE: 133 mg/dL — AB (ref 65–99)
POTASSIUM: 5 mmol/L (ref 3.5–5.1)
Sodium: 139 mmol/L (ref 135–145)

## 2015-08-11 LAB — PHOSPHORUS: Phosphorus: 5.3 mg/dL — ABNORMAL HIGH (ref 2.5–4.6)

## 2015-08-11 LAB — MAGNESIUM: Magnesium: 2 mg/dL (ref 1.7–2.4)

## 2015-08-11 MED ORDER — METOPROLOL TARTRATE 25 MG PO TABS
25.0000 mg | ORAL_TABLET | Freq: Two times a day (BID) | ORAL | Status: DC
  Administered 2015-08-12 – 2015-08-14 (×6): 25 mg via ORAL
  Filled 2015-08-11 (×8): qty 1

## 2015-08-11 NOTE — Progress Notes (Signed)
[facility] Progess Note    ASSESSMENT/PLAN   Recurrent aspiration pneumonia  PULMONARY Acute hypoxic respiratory failure, uncertain etiology, however, given his rapid improvement. He may have had an an episode of aspiration pneumonitis, or acute/ pulmonary edema, complicating pneumonia. Chest x-ray from provider first 2017 was reviewed, consistent with right lower lobe pneumonia -Continue dialysis per nephrology. -Suspect recurrent aspiration, palliative care note from 1/26 noted, at that time it was noted the patient stated that he did not want a feeding tube, though yesterday when I discussed this with him, he seemed to go back on that and indicates that he wouldn't want a feeding tube.  Given his high risk of recurrent aspiration and now readmission with aspiration pneumonia, if the patient and his family desire continued aggressive care- I would recommend placement of a jejunostomy tube or a GJ tube. -I will consult palliative care to help the family through this decision process.  CARDIOVASCULAR Septic shock with hypotension , resolving off pressors. -Continue antibiotics.    RENAL End-stage renal disease on hemodialysis. -Continue hemodialysis per nephrology.  GASTROINTESTINAL A: Dysphagia with aspiration. -Continue dysphagia 1 diet with honey thick liquids  HEMATOLOGIC A: --  INFECTIOUS A: Pneumonia with septic shock, improved. Can de-escalate antibiotics.   Social: -Patient was seen by palliative care service, on previous admission. Was noted to be an dysphagia 1 with honey thick liquids. At that time it was also noted that he stated that he did not want a feeding tube, and that he was a [facility] patient.  ---------------------------------------  Patient is stable from a care standpoint for transfer to the floor, we will sign off for now. Please call for any further questions or  concerns. ----------------------------------------   Name: Sean Delgado. MRN: [id number] DOB: 1934-01-12    ADMISSION DATE:  08/08/2015    Patient looks well and feels better today.  Review of Systems:  Constitutional: Feels well. Cardiovascular: No chest pain.  Pulmonary: Denies dyspnea.   The remainder of systems were reviewed and were found to be negative other than what is documented in the HPI.    VITAL SIGNS: Temp:  [97.1 F (36.2 C)-98.4 F (36.9 C)] 97.1 F (36.2 C) (02/03 0000) Pulse Rate:  [56-99] 81 (02/03 0700) Resp:  [16-25] 21 (02/03 0943) BP: (43-132)/(16-74) 132/69 mmHg (02/03 0900) SpO2:  [90 %-100 %] 95 % (02/03 0943) Weight:  [213 lb 3 oz (96.7 kg)] 213 lb 3 oz (96.7 kg) (02/02 1300) HEMODYNAMICS:   VENTILATOR SETTINGS:   INTAKE / OUTPUT:  Intake/Output Summary (Last 24 hours) at 08/11/15 0950 Last data filed at 08/11/15 0945  Gross per 24 hour  Intake    893 ml  Output      0 ml  Net    893 ml    PHYSICAL EXAMINATION: Physical Examination:   VS: BP 132/69 mmHg  Pulse 81  Temp(Src) 97.1 F (36.2 C) (Axillary)  Resp 21  Ht  (1.854 m)  Wt 213 lb 3 oz (96.7 kg)  BMI 28.13 kg/m2  SpO2 95%  General Appearance: No distress  Neuro:without focal findings, mental status normal. HEENT: PERRLA, EOM intact. Pulmonary: normal breath sounds   CardiovascularNormal S1,S2.  No m/r/g.   Abdomen: Benign, Soft, non-tender. Renal:  No costovertebral tenderness  GU:  Not performed at this time. Endocrine: No evident thyromegaly. Skin:   warm, no rashes, no ecchymosis  Extremities: normal, no cyanosis, clubbing.   LABS:   LABORATORY PANEL:   CBC  Recent Labs Lab 08/11/15 0449  WBC 9.2  HGB 7.7*  HCT 25.7*  PLT 221    Chemistries   Recent Labs Lab 08/08/15 2207  08/11/15 0449  NA 140  < > 139  K 3.1*  < > 5.0  CL 97*  < > 104  CO2 33*  < > 24  GLUCOSE 104*  < > 133*  BUN 12  < > 26*  CREATININE 3.08*  < > 4.78*   CALCIUM 7.5*  < > 7.5*  MG  --   < > 2.0  PHOS  --   < > 5.3*  AST 15  --   --   ALT 8*  --   --   ALKPHOS 68  --   --   BILITOT 0.6  --   --   < > = values in this interval not displayed.   Recent Labs Lab 08/09/15 0224  GLUCAP 94   No results for input(s): PHART, PCO2ART, PO2ART in the last 168 hours.  Recent Labs Lab 08/08/15 2207  AST 15  ALT 8*  ALKPHOS 68  BILITOT 0.6  ALBUMIN 2.1*    Cardiac Enzymes  Recent Labs Lab 08/08/15 2207  TROPONINI 0.05*    RADIOLOGY:  Dg Chest 2 View  08/10/2015  CLINICAL DATA:  Pain patient with pneumonia diagnosis. Follow-up exam. History of hypertension, CHF and stroke. EXAM: CHEST  2 VIEW COMPARISON:  08/08/2015. FINDINGS: There is bibasilar lung opacity, some on the left and improved on the right when compared to the prior study. There are no new areas of lung consolidation. Cardiac silhouette is mildly enlarged. No mediastinal or hilar masses or convincing adenopathy. Right internal jugular dual-lumen central venous line is stable. IMPRESSION: 1. Mild improvement in right lung base consolidation. No change in left lung base consolidation. No evidence of pulmonary edema. No new abnormalities. Electronically Signed   By: Amie Portland M.D.   On: 08/10/2015 19:30       --Wells Guiles, MD.  [facility]  Santiago Glad, M.D.  Stephanie Acre, M.D.  Billy Fischer, M.D

## 2015-08-11 NOTE — Progress Notes (Signed)
Patient left ICU by bed, alert with no distress noted with Kathlene November, orderly. Patient going to xray then room 202. Off unit tele monitor applied to patient and verified with Liborio Nixon, tele clerk prior to leaving ICU.

## 2015-08-11 NOTE — Progress Notes (Signed)
Pharmacy Antibiotic Note  Sean Delgado. is a 82 y.o. male admitted on 08/08/2015 with pneumonia.  Pharmacy has been consulted for cefepime and vancomycin dosing.  Plan: Continue cefepime 2 gm IV Q48H (after each dialysis) and vancomycin 1 gm IV Q48H (after each dialysis). Vancomycin pre-HD level scheduled 02/07.    Height:  (185.4 cm) Weight: 213 lb 3 oz (96.7 kg) IBW/kg (Calculated) : 79.9  Temp (24hrs), Avg:97.7 F (36.5 C), Min:97.1 F (36.2 C), Max:98.4 F (36.9 C)   Recent Labs Lab 08/08/15 2207 08/09/15 0953 08/10/15 0701 08/10/15 0859 08/11/15 0449  WBC 12.3* 8.9 9.2  --  9.2  CREATININE 3.08* 3.45*  --  4.24* 4.78*  LATICACIDVEN 1.6 2.4*  --   --   --     Estimated Creatinine Clearance: 15.3 mL/min (by C-G formula based on Cr of 4.78).    No Known Allergies   Antibiotics:  Vancomycin 01/31 >> Cefepime 02/01 >> Metronidazole 02/01 >>  Microbiology:  01/31 Blood cx: NTD    Thank you for allowing pharmacy to be a part of this patient's care.  Luisa Hart D, Pharm.D., BCPS Clinical Pharmacist 08/11/2015 2:11 PM

## 2015-08-11 NOTE — Progress Notes (Signed)
   08/11/15 1553  Clinical Encounter Type  Visited With Patient  Visit Type Initial  Consult/Referral To Chaplain  Spiritual Encounters  Spiritual Needs Prayer  Chaplain provided pastoral care to patient.   [employer] Niquan Charnley [phone]

## 2015-08-11 NOTE — Progress Notes (Signed)
Pt arrived on unit. VSS.  Sean Delgado   

## 2015-08-11 NOTE — Progress Notes (Signed)
Subjective:   Patient known to our practice from previous admission His last dialysis was Thursday He was sent from the dialysis center because of shortness of breath No complaints today Dialysis could not be done yesterday because of catheter malfunction It was packed with TPA    Objective:  Vital signs in last 24 hours:  Temp:  [97.1 F (36.2 C)-98.4 F (36.9 C)] 97.1 F (36.2 C) (02/03 0000) Pulse Rate:  [56-99] 81 (02/03 0700) Resp:  [16-25] 21 (02/03 0943) BP: (43-132)/(16-74) 132/69 mmHg (02/03 0900) SpO2:  [90 %-100 %] 95 % (02/03 0943) Weight:  [96.7 kg (213 lb 3 oz)] 96.7 kg (213 lb 3 oz) (02/02 1300)  Weight change: 0 kg (0 lb) Filed Weights   08/09/15 0242 08/10/15 0535 08/10/15 1300  Weight: 92.7 kg (204 lb 5.9 oz) 96.7 kg (213 lb 3 oz) 96.7 kg (213 lb 3 oz)    Intake/Output:    Intake/Output Summary (Last 24 hours) at 08/11/15 1114 Last data filed at 08/11/15 0945  Gross per 24 hour  Intake    893 ml  Output      0 ml  Net    893 ml     Physical Exam: General:  no acute distress, laying in the bed   HEENT  anicteric, moist oral mucous membranes , right eye blindness  Neck  supple   Pulm/lungs  coarse breath sounds, normal effort  CVS/Heart  S1S2 irregular  Abdomen:   soft, nontender, nondistended   Extremities:  no peripheral edema   Neurologic:  alert,  follows commands  Skin:  no acute rashes   Access: RIJ permcath 08/02/15 Dr. Gilda Crease. Right AVF maturing with bruit and thrill       Basic Metabolic Panel:   Recent Labs Lab 08/08/15 2207 08/09/15 0953 08/09/15 1710 08/10/15 0859 08/11/15 0449  NA 140 139  --  139 139  K 3.1* 3.2*  --  5.0 5.0  CL 97* 101  --  105 104  CO2 33* 28  --  25 24  GLUCOSE 104* 94  --  88 133*  BUN 12 15  --  21* 26*  CREATININE 3.08* 3.45*  --  4.24* 4.78*  CALCIUM 7.5* 7.4*  --  7.6* 7.5*  MG  --   --  1.7  --  2.0  PHOS  --   --  4.4  --  5.3*     CBC:  Recent Labs Lab 08/08/15 2207  08/09/15 0953 08/10/15 0701 08/11/15 0449  WBC 12.3* 8.9 9.2 9.2  NEUTROABS 9.8*  --   --   --   HGB 9.1* 9.9* 8.6* 7.7*  HCT 30.4* 33.5* 29.3* 25.7*  MCV 86.0 88.4 90.4 89.1  PLT 293 260 247 221      Microbiology:  Recent Results (from the past 720 hour(s))  Culture, blood (routine x 2)     Status: None   Collection Time: 07/22/15  6:21 PM  Result Value Ref Range Status   Specimen Description BLOOD LEFT HAND  Final   Special Requests BOTTLES DRAWN AEROBIC AND ANAEROBIC  4CC  Final   Culture  Setup Time   Final    GRAM POSITIVE COCCI IN CLUSTERS AEROBIC BOTTLE ONLY CRITICAL RESULT CALLED TO, READ BACK BY AND VERIFIED WITH: CRYSTAL [facility] 07/23/15 1330 MLM    Culture   Final    METHICILLIN RESISTANT STAPHYLOCOCCUS AUREUS AEROBIC BOTTLE ONLY    Report Status 07/27/2015 FINAL  Final   Organism  ID, Bacteria METHICILLIN RESISTANT STAPHYLOCOCCUS AUREUS  Final      Susceptibility   Methicillin resistant staphylococcus aureus - MIC*    CIPROFLOXACIN >=8 RESISTANT Resistant     ERYTHROMYCIN <=0.25 SENSITIVE Sensitive     GENTAMICIN <=0.5 SENSITIVE Sensitive     OXACILLIN >=4 RESISTANT Resistant     VANCOMYCIN 1 SENSITIVE Sensitive     TRIMETH/SULFA <=10 SENSITIVE Sensitive     CLINDAMYCIN <=0.25 SENSITIVE Sensitive     CEFOXITIN SCREEN Value in next row Resistant      POSITIVECEFOXITIN SCREEN - This test may be used to predict mecA-mediated oxacillin resistance, and it is based on the cefoxitin disk screen test.  The cefoxitin screen and oxacillin work in combination to determine the final interpretation reported for oxacillin.     Inducible Clindamycin Value in next row Sensitive      POSITIVECEFOXITIN SCREEN - This test may be used to predict mecA-mediated oxacillin resistance, and it is based on the cefoxitin disk screen test.  The cefoxitin screen and oxacillin work in combination to determine the final interpretation reported for oxacillin.     TETRACYCLINE Value in next row  Sensitive      SENSITIVE<=1    * METHICILLIN RESISTANT STAPHYLOCOCCUS AUREUS  Culture, blood (routine x 2)     Status: None   Collection Time: 07/22/15  6:21 PM  Result Value Ref Range Status   Specimen Description BLOOD LEFT HAND  Final   Special Requests   Final    BOTTLES DRAWN AEROBIC AND ANAEROBIC  AER 2CC ANA 4CC   Culture  Setup Time   Final    GRAM POSITIVE COCCI IN CLUSTERS IN BOTH AEROBIC AND ANAEROBIC BOTTLES CRITICAL RESULT CALLED TO, READ BACK BY AND VERIFIED WITH: CRYSTAL SCARPENA 07/23/15 1400 MLM    Culture METHICILLIN RESISTANT STAPHYLOCOCCUS AUREUS  Final   Report Status 07/27/2015 FINAL  Final   Organism ID, Bacteria METHICILLIN RESISTANT STAPHYLOCOCCUS AUREUS  Final      Susceptibility   Methicillin resistant staphylococcus aureus - MIC*    CIPROFLOXACIN >=8 RESISTANT Resistant     ERYTHROMYCIN 0.5 SENSITIVE Sensitive     GENTAMICIN <=0.5 SENSITIVE Sensitive     OXACILLIN >=4 RESISTANT Resistant     VANCOMYCIN 1 SENSITIVE Sensitive     TRIMETH/SULFA <=10 SENSITIVE Sensitive     CLINDAMYCIN <=0.25 SENSITIVE Sensitive     CEFOXITIN SCREEN Value in next row Resistant      POSITIVECEFOXITIN SCREEN - This test may be used to predict mecA-mediated oxacillin resistance, and it is based on the cefoxitin disk screen test.  The cefoxitin screen and oxacillin work in combination to determine the final interpretation reported for oxacillin.     Inducible Clindamycin Value in next row Sensitive      POSITIVECEFOXITIN SCREEN - This test may be used to predict mecA-mediated oxacillin resistance, and it is based on the cefoxitin disk screen test.  The cefoxitin screen and oxacillin work in combination to determine the final interpretation reported for oxacillin.     TETRACYCLINE Value in next row Sensitive      SENSITIVE<=1    * METHICILLIN RESISTANT STAPHYLOCOCCUS AUREUS  Blood Culture ID Panel (Reflexed)     Status: Abnormal   Collection Time: 07/22/15  6:21 PM  Result Value  Ref Range Status   Enterococcus species NOT DETECTED NOT DETECTED Final   Listeria monocytogenes NOT DETECTED NOT DETECTED Final   Staphylococcus species DETECTED (A) NOT DETECTED Final   Staphylococcus aureus DETECTED (  A) NOT DETECTED Final    Comment: CRITICAL RESULT CALLED TO, READ BACK BY AND VERIFIED WITH: CRYSTAL SCARPENA 07/23/15 1330 ABOUT STAPH SPECIES,STAPH AUREUS, AND MecA DETECTED MLM    Streptococcus species NOT DETECTED NOT DETECTED Final   Streptococcus agalactiae NOT DETECTED NOT DETECTED Final   Streptococcus pneumoniae NOT DETECTED NOT DETECTED Final   Streptococcus pyogenes NOT DETECTED NOT DETECTED Final   Acinetobacter baumannii NOT DETECTED NOT DETECTED Final   Enterobacteriaceae species NOT DETECTED NOT DETECTED Final   Enterobacter cloacae complex NOT DETECTED NOT DETECTED Final   Escherichia coli NOT DETECTED NOT DETECTED Final   Klebsiella oxytoca NOT DETECTED NOT DETECTED Final   Klebsiella pneumoniae NOT DETECTED NOT DETECTED Final   Proteus species NOT DETECTED NOT DETECTED Final   Serratia marcescens NOT DETECTED NOT DETECTED Final   Haemophilus influenzae NOT DETECTED NOT DETECTED Final   Neisseria meningitidis NOT DETECTED NOT DETECTED Final   Pseudomonas aeruginosa NOT DETECTED NOT DETECTED Final   Candida albicans NOT DETECTED NOT DETECTED Final   Candida glabrata NOT DETECTED NOT DETECTED Final   Candida krusei NOT DETECTED NOT DETECTED Final   Candida parapsilosis NOT DETECTED NOT DETECTED Final   Candida tropicalis NOT DETECTED NOT DETECTED Final   Carbapenem resistance NOT DETECTED NOT DETECTED Final   Methicillin resistance DETECTED (A) NOT DETECTED Final   Vancomycin resistance NOT DETECTED NOT DETECTED Final  MRSA PCR Screening     Status: Abnormal   Collection Time: 07/23/15  5:24 AM  Result Value Ref Range Status   MRSA by PCR POSITIVE (A) NEGATIVE Final    Comment:        The GeneXpert MRSA Assay (FDA approved for NASAL specimens only),  is one component of a comprehensive MRSA colonization surveillance program. It is not intended to diagnose MRSA infection nor to guide or monitor treatment for MRSA infections. CRITICAL RESULT CALLED TO, READ BACK BY AND VERIFIED WITH: ALISHA BABB ON 07/23/15 AT 0735 BY QSD   CULTURE, BLOOD (ROUTINE X 2) w Reflex to PCR ID Panel     Status: None   Collection Time: 07/25/15  6:08 AM  Result Value Ref Range Status   Specimen Description BLOOD LEFT AC  Final   Special Requests BOTTLES DRAWN AEROBIC AND ANAEROBIC ANA2ML AER3ML  Final   Culture NO GROWTH 5 DAYS  Final   Report Status 07/30/2015 FINAL  Final  CULTURE, BLOOD (ROUTINE X 2) w Reflex to PCR ID Panel     Status: None   Collection Time: 07/25/15  6:08 AM  Result Value Ref Range Status   Specimen Description BLOOD LEFT WRIST  Final   Special Requests   Final    BOTTLES DRAWN AEROBIC AND ANAEROBIC ANA AER   Culture NO GROWTH 5 DAYS  Final   Report Status 07/30/2015 FINAL  Final  Cath Tip Culture     Status: None   Collection Time: 07/28/15  7:32 PM  Result Value Ref Range Status   Specimen Description CATH TIP  Final   Special Requests NONE  Final   Culture NO GROWTH 3 DAYS  Final   Report Status 07/31/2015 FINAL  Final  C difficile quick scan w PCR reflex     Status: Abnormal   Collection Time: 07/31/15 10:28 AM  Result Value Ref Range Status   C Diff antigen POSITIVE (A) NEGATIVE Final   C Diff toxin NEGATIVE NEGATIVE Final   C Diff interpretation   Final    Positive  for toxigenic C. difficile, active toxin production not detected. Patient has toxigenic C. difficile organisms present in the bowel, but toxin was not detected. The patient may be a carrier or the level of toxin in the sample was below the limit  of detection. This information should be used in conjunction with the patient's clinical history when deciding on possible therapy.   Clostridium Difficile by PCR     Status: Abnormal   Collection Time:  07/31/15 10:28 AM  Result Value Ref Range Status   Toxigenic C Difficile by pcr POSITIVE (A) NEGATIVE Final    Comment: CRITICAL RESULT CALLED TO, READ BACK BY AND VERIFIED WITH: TAYLOR BECK 07/31/15 AT 1211 VKB   Blood Culture (routine x 2)     Status: None (Preliminary result)   Collection Time: 08/08/15 10:08 PM  Result Value Ref Range Status   Specimen Description BLOOD LEFT ARM  Final   Special Requests BOTTLES DRAWN AEROBIC AND ANAEROBIC  Final   Culture NO GROWTH 3 DAYS  Final   Report Status PENDING  Incomplete  Blood Culture (routine x 2)     Status: None (Preliminary result)   Collection Time: 08/08/15 10:20 PM  Result Value Ref Range Status   Specimen Description BLOOD LEFT ANTECUBITAL  Final   Special Requests   Final    BOTTLES DRAWN AEROBIC AND ANAEROBIC ,   Culture NO GROWTH 3 DAYS  Final   Report Status PENDING  Incomplete    Coagulation Studies:  Recent Labs  08/08/15 2207  LABPROT 15.9*  INR 1.26    Urinalysis: No results for input(s): COLORURINE, LABSPEC, PHURINE, GLUCOSEU, HGBUR, BILIRUBINUR, KETONESUR, PROTEINUR, UROBILINOGEN, NITRITE, LEUKOCYTESUR in the last 72 hours.  Invalid input(s): APPERANCEUR    Imaging: Dg Chest 2 View  08/10/2015  CLINICAL DATA:  Pain patient with pneumonia diagnosis. Follow-up exam. History of hypertension, CHF and stroke. EXAM: CHEST  2 VIEW COMPARISON:  08/08/2015. FINDINGS: There is bibasilar lung opacity, some on the left and improved on the right when compared to the prior study. There are no new areas of lung consolidation. Cardiac silhouette is mildly enlarged. No mediastinal or hilar masses or convincing adenopathy. Right internal jugular dual-lumen central venous line is stable. IMPRESSION: 1. Mild improvement in right lung base consolidation. No change in left lung base consolidation. No evidence of pulmonary edema. No new abnormalities. Electronically Signed   By: Amie Portland M.D.   On:  08/10/2015 19:30     Medications:   . alteplase    . norepinephrine (LEVOPHED) Adult infusion 1 mcg/min (08/10/15 0838)   . allopurinol  100 mg Oral Daily  . antiseptic oral rinse  7 mL Mouth Rinse BID  . aspirin EC  81 mg Oral Daily  . atorvastatin  20 mg Oral QHS  . brimonidine  1 drop Left Eye BID  . ceFEPime (MAXIPIME) IV  2 g Intravenous Q T,Th,Sa-HD  . clopidogrel  75 mg Oral Daily  . docusate sodium  100 mg Oral BID  . epoetin (EPOGEN/PROCRIT) injection  4,000 Units Intravenous Q T,Th,Sa-HD  . ferrous sulfate  325 mg Oral Daily  . guaiFENesin  600 mg Oral BID  . heparin  5,000 Units Subcutaneous 3 times per day  . latanoprost  1 drop Both Eyes QHS  . lidocaine  1 patch Transdermal Daily  . lidocaine  1 patch Transdermal Q24H  . megestrol  400 mg Oral BID  . metoprolol tartrate  12.5 mg Oral BID  .  metroNIDAZOLE  500 mg Oral 3 times per day  . midodrine  5 mg Oral BID WC  . pneumococcal 23 valent vaccine  0.5 mL Intramuscular Tomorrow-1000  . pregabalin  75 mg Oral Daily  . sodium chloride flush  3 mL Intravenous Q12H  . tolnaftate  1 application Topical Daily  . vancomycin  1,000 mg Intravenous Q T,Th,Sa-HD   acetaminophen **OR** acetaminophen, alteplase, benzonatate, midodrine, morphine injection, ondansetron **OR** ondansetron (ZOFRAN) IV  Assessment/ Plan:  82 y.o. black male  with right eye blindness, diastolic congestive heart failure, hypertension, gout, glaucoma, peripheral neuropathy, atrial fibrillation, anemia, carotid stenosis status post bilateral CEA, CVA, peripheral vascular disease   [facility]/ TTS  1. End Stage Renal Disease: tolerating treatment well. Continue TTS schedule.  - will plan on dialysis today - Midodrine prior to dialysis   2. Anemia of chronic kidney disease: Hemoglobin 7.7 - continue epogen IV with HD.  - Significant drop in hemoglobin is noted  3. Secondary Hyperparathyroidism:  - Phosphorus  during this hospitalization  4. HTN Antihypertensives are being held Consider continuing low-dose metoprolol to prevent abrupt withdrawal of medication   LOS: 2 Stacye Noori 2/3/201711:14 AM

## 2015-08-11 NOTE — Progress Notes (Signed)
Dr. Anne Hahn was notified concerning Lopressor administration due at this time. Pt.'s BP is 121/42 and pulse 76. Dr. Anne Hahn ordered to hold Lopressor at this time. Will continue to monitor pt.  Sean Delgado

## 2015-08-11 NOTE — Progress Notes (Signed)
Pt instructed on flutter valve.

## 2015-08-11 NOTE — Progress Notes (Signed)
Report called to Clydie Braun, RN on 2C.  Patient has just finished with dialysis treatment and tolerated well.  Alert to self and place.

## 2015-08-11 NOTE — Progress Notes (Signed)
[facility] - [place] at [facility]   PATIENT NAME: Sean Delgado    MR#:  [id number]  DATE OF BIRTH:  [birth year]  SUBJECTIVE:  CHIEF COMPLAINT:   Chief Complaint  Patient presents with  . Shortness of Breath   -Feels much better today. Increased to 4l o2 by nasal cannula - Still has the cough. -Dialysis couldn't be done yesterday because the dialysis access was clotted off. Possible dialysis today as it was packed with TPA last night  REVIEW OF SYSTEMS:  Review of Systems  Constitutional: Positive for malaise/fatigue. Negative for fever and chills.  Respiratory: Positive for cough and shortness of breath. Negative for wheezing.   Cardiovascular: Negative for chest pain and palpitations.  Gastrointestinal: Negative for nausea, vomiting, abdominal pain, diarrhea and constipation.  Genitourinary: Negative for dysuria.  Musculoskeletal: Positive for myalgias.  Neurological: Negative for dizziness, seizures and headaches.  Psychiatric/Behavioral: Negative for depression.    DRUG ALLERGIES:  No Known Allergies  VITALS:  Blood pressure 114/54, pulse 65, temperature 97.6 F (36.4 C), temperature source Oral, resp. rate 19, height  (1.854 m), weight 96.7 kg (213 lb 3 oz), SpO2 100 %.  PHYSICAL EXAMINATION:  Physical Exam  GENERAL:  82 y.o.-year-old patient lying in the bed with no acute distress.  EYES: Pupils equal, round, reactive to light and accommodation. No scleral icterus. Extraocular muscles intact.  HEENT: Head atraumatic, normocephalic. Oropharynx and nasopharynx clear.  NECK:  Supple, no jugular venous distention. No thyroid enlargement, no tenderness.  LUNGS: Normal breath sounds bilaterally, no wheezing, rhonchi or crepitation. No use of accessory muscles of respiration. Fine bibasilar crackles. Decreased bibasilar breath sounds. CARDIOVASCULAR: S1, S2 normal. No murmurs, rubs, or gallops.  ABDOMEN: Soft, nontender, nondistended. Bowel  sounds present. No organomegaly or mass.  EXTREMITIES: No pedal edema, cyanosis, or clubbing.  NEUROLOGIC: Cranial nerves II through XII are intact. Muscle strength 5/5 in all extremities. Sensation intact. Gait not checked.  PSYCHIATRIC: The patient is alert and oriented x 3.  SKIN: No obvious rash, lesion, or ulcer.    LABORATORY PANEL:   CBC  Recent Labs Lab 08/11/15 0449  WBC 9.2  HGB 7.7*  HCT 25.7*  PLT 221   ------------------------------------------------------------------------------------------------------------------  Chemistries   Recent Labs Lab 08/08/15 2207  08/11/15 0449  NA 140  < > 139  K 3.1*  < > 5.0  CL 97*  < > 104  CO2 33*  < > 24  GLUCOSE 104*  < > 133*  BUN 12  < > 26*  CREATININE 3.08*  < > 4.78*  CALCIUM 7.5*  < > 7.5*  MG  --   < > 2.0  AST 15  --   --   ALT 8*  --   --   ALKPHOS 68  --   --   BILITOT 0.6  --   --   < > = values in this interval not displayed. ------------------------------------------------------------------------------------------------------------------  Cardiac Enzymes  Recent Labs Lab 08/08/15 2207  TROPONINI 0.05*   ------------------------------------------------------------------------------------------------------------------  RADIOLOGY:  Dg Chest 2 View  08/10/2015  CLINICAL DATA:  Pain patient with pneumonia diagnosis. Follow-up exam. History of hypertension, CHF and stroke. EXAM: CHEST  2 VIEW COMPARISON:  08/08/2015. FINDINGS: There is bibasilar lung opacity, some on the left and improved on the right when compared to the prior study. There are no new areas of lung consolidation. Cardiac silhouette is mildly enlarged. No mediastinal or hilar masses or convincing adenopathy. Right internal jugular  dual-lumen central venous line is stable. IMPRESSION: 1. Mild improvement in right lung base consolidation. No change in left lung base consolidation. No evidence of pulmonary edema. No new abnormalities.  Electronically Signed   By: Amie Portland M.D.   On: 08/10/2015 19:30    EKG:   Orders placed or performed during the hospital encounter of 08/08/15  . ED EKG 12-Lead  . ED EKG 12-Lead  . EKG 12-Lead  . EKG 12-Lead    ASSESSMENT AND PLAN:   82 year old male with past medical history significant for hypertension, congestive heart failure, coronary artery disease, atrial fibrillation, end-stage renal disease on hemodialysis presents to the hospital secondary to hypoxemia and also hypertension.  #1 acute hypoxic respiratory failure-secondary to bilateral pneumonia -Chest x-ray with bibasilar pneumonia. Still needing for L nasal cannula of oxygen. - repeat chest x-ray today. Chest x-ray from yesterday still showing left lower lobe infiltrate -Continue antibiotics with vancomycin and cefepime. -Blood cultures have remained negative. We'll confirm to make sure that patient was not getting vancomycin with dialysis prior to admission for any other reason before discontinuing it.  #2 sepsis-secondary to pneumonia. -Low normal blood pressure at baseline. Off pressors -Discontinue midodrine today. Low-dose metoprolol started. -Follow up chest x-ray today. On antibiotics  #3 end-stage renal disease on hemodialysis-appreciate nephrology consult. Patient getting dialysis per schedule. Dialysis held yesterday as his access was clotted, packed with tPA and possible dialysis today.  #4 hypertension-metoprolol restarted as blood pressure is improved.  Cardizem and Imdur are on hold. -Low dose metoprolol started to help with his A. fib  #5 recent C. difficile colitis-continue Flagyl at this time. No diarrhea noted.  #6 coronary artery disease-stable at this time. No chest pain. Continue aspirin and statin. Low-dose metoprolol will be started. Also on Plavix  #7 DVT prophylaxis-on subcutaneous heparin   Patient is from [facility]. Possible discharge in the next 1-2 days   All the records  are reviewed and case discussed with Care Management/Social Workerr. Management plans discussed with the patient, family and they are in agreement.  CODE STATUS: Full Code  TOTAL  TIME SPENT IN TAKING CARE OF THIS PATIENT: 38 minutes.   POSSIBLE D/C IN 1-2 DAYS, DEPENDING ON CLINICAL CONDITION.   Bethlehem Langstaff M.D on 08/11/2015 at 2:10 PM  Between 7am to 6pm - Pager - [phone]  After 6pm go to [email or web address] - password [email or web address] [facility]  [facility]  Office  [phone]  CC: Primary care physician; Bobbye Morton, MD

## 2015-08-12 LAB — CBC
HCT: 30.5 % — ABNORMAL LOW (ref 40.0–52.0)
HEMOGLOBIN: 9.3 g/dL — AB (ref 13.0–18.0)
MCH: 27.6 pg (ref 26.0–34.0)
MCHC: 30.6 g/dL — ABNORMAL LOW (ref 32.0–36.0)
MCV: 90.2 fL (ref 80.0–100.0)
PLATELETS: 230 10*3/uL (ref 150–440)
RBC: 3.38 MIL/uL — AB (ref 4.40–5.90)
RDW: 22.9 % — ABNORMAL HIGH (ref 11.5–14.5)
WBC: 8.5 10*3/uL (ref 3.8–10.6)

## 2015-08-12 LAB — BASIC METABOLIC PANEL
ANION GAP: 5 (ref 5–15)
BUN: 18 mg/dL (ref 6–20)
CHLORIDE: 104 mmol/L (ref 101–111)
CO2: 32 mmol/L (ref 22–32)
Calcium: 7.8 mg/dL — ABNORMAL LOW (ref 8.9–10.3)
Creatinine, Ser: 3.49 mg/dL — ABNORMAL HIGH (ref 0.61–1.24)
GFR, EST AFRICAN AMERICAN: 18 mL/min — AB (ref >60)
GFR, EST NON AFRICAN AMERICAN: 15 mL/min — AB (ref >60)
Glucose, Bld: 81 mg/dL (ref 65–99)
POTASSIUM: 4 mmol/L (ref 3.5–5.1)
SODIUM: 141 mmol/L (ref 135–145)

## 2015-08-12 NOTE — Progress Notes (Signed)
Subjective:   Patient  was sent from the dialysis center because of shortness of breath No complaints today Dialysis was done on Friday successfully after TPA packing of catheter Patient tolerated the treatment well No acute complaints today + cough    Objective:  Vital signs in last 24 hours:  Temp:  [97.3 F (36.3 C)-98.2 F (36.8 C)] 97.3 F (36.3 C) (02/04 0421) Pulse Rate:  [51-124] 120 (02/04 0421) Resp:  [16-27] 18 (02/04 0421) BP: (79-146)/(37-83) 135/53 mmHg (02/04 0421) SpO2:  [91 %-100 %] 91 % (02/04 0421) Weight:  [99.701 kg (219 lb 12.8 oz)] 99.701 kg (219 lb 12.8 oz) (02/04 0500)  Weight change: 3.001 kg (6 lb 9.8 oz) Filed Weights   08/10/15 0535 08/10/15 1300 08/12/15 0500  Weight: 96.7 kg (213 lb 3 oz) 96.7 kg (213 lb 3 oz) 99.701 kg (219 lb 12.8 oz)    Intake/Output:    Intake/Output Summary (Last 24 hours) at 08/12/15 1152 Last data filed at 08/12/15 0908  Gross per 24 hour  Intake    440 ml  Output    125 ml  Net    315 ml     Physical Exam: General:  no acute distress, laying in the bed   HEENT  anicteric, moist oral mucous membranes , right eye blindness  Neck  supple   Pulm/lungs  coarse breath sounds, normal effort  CVS/Heart  S1S2 irregular  Abdomen:   soft, nontender, nondistended   Extremities:  no peripheral edema   Neurologic:  alert,  follows commands  Skin:  no acute rashes   Access: RIJ permcath 08/02/15 Dr. Gilda Crease. Right AVF maturing with bruit and thrill       Basic Metabolic Panel:   Recent Labs Lab 08/08/15 2207 08/09/15 0953 08/09/15 1710 08/10/15 0859 08/11/15 0449 08/12/15 0435  NA 140 139  --  139 139 141  K 3.1* 3.2*  --  5.0 5.0 4.0  CL 97* 101  --  105 104 104  CO2 33* 28  --  25 24 32  GLUCOSE 104* 94  --  88 133* 81  BUN 12 15  --  21* 26* 18  CREATININE 3.08* 3.45*  --  4.24* 4.78* 3.49*  CALCIUM 7.5* 7.4*  --  7.6* 7.5* 7.8*  MG  --   --  1.7  --  2.0  --   PHOS  --   --  4.4  --  5.3*  --       CBC:  Recent Labs Lab 08/08/15 2207 08/09/15 0953 08/10/15 0701 08/11/15 0449 08/12/15 0435  WBC 12.3* 8.9 9.2 9.2 8.5  NEUTROABS 9.8*  --   --   --   --   HGB 9.1* 9.9* 8.6* 7.7* 9.3*  HCT 30.4* 33.5* 29.3* 25.7* 30.5*  MCV 86.0 88.4 90.4 89.1 90.2  PLT 293 260 247 221 230      Microbiology:  Recent Results (from the past 720 hour(s))  Culture, blood (routine x 2)     Status: None   Collection Time: 07/22/15  6:21 PM  Result Value Ref Range Status   Specimen Description BLOOD LEFT HAND  Final   Special Requests BOTTLES DRAWN AEROBIC AND ANAEROBIC  4CC  Final   Culture  Setup Time   Final    GRAM POSITIVE COCCI IN CLUSTERS AEROBIC BOTTLE ONLY CRITICAL RESULT CALLED TO, READ BACK BY AND VERIFIED WITH: CRYSTAL [facility] 07/23/15 1330 MLM    Culture   Final  METHICILLIN RESISTANT STAPHYLOCOCCUS AUREUS AEROBIC BOTTLE ONLY    Report Status 07/27/2015 FINAL  Final   Organism ID, Bacteria METHICILLIN RESISTANT STAPHYLOCOCCUS AUREUS  Final      Susceptibility   Methicillin resistant staphylococcus aureus - MIC*    CIPROFLOXACIN >=8 RESISTANT Resistant     ERYTHROMYCIN <=0.25 SENSITIVE Sensitive     GENTAMICIN <=0.5 SENSITIVE Sensitive     OXACILLIN >=4 RESISTANT Resistant     VANCOMYCIN 1 SENSITIVE Sensitive     TRIMETH/SULFA <=10 SENSITIVE Sensitive     CLINDAMYCIN <=0.25 SENSITIVE Sensitive     CEFOXITIN SCREEN Value in next row Resistant      POSITIVECEFOXITIN SCREEN - This test may be used to predict mecA-mediated oxacillin resistance, and it is based on the cefoxitin disk screen test.  The cefoxitin screen and oxacillin work in combination to determine the final interpretation reported for oxacillin.     Inducible Clindamycin Value in next row Sensitive      POSITIVECEFOXITIN SCREEN - This test may be used to predict mecA-mediated oxacillin resistance, and it is based on the cefoxitin disk screen test.  The cefoxitin screen and oxacillin work in combination to  determine the final interpretation reported for oxacillin.     TETRACYCLINE Value in next row Sensitive      SENSITIVE<=1    * METHICILLIN RESISTANT STAPHYLOCOCCUS AUREUS  Culture, blood (routine x 2)     Status: None   Collection Time: 07/22/15  6:21 PM  Result Value Ref Range Status   Specimen Description BLOOD LEFT HAND  Final   Special Requests   Final    BOTTLES DRAWN AEROBIC AND ANAEROBIC  AER 2CC ANA 4CC   Culture  Setup Time   Final    GRAM POSITIVE COCCI IN CLUSTERS IN BOTH AEROBIC AND ANAEROBIC BOTTLES CRITICAL RESULT CALLED TO, READ BACK BY AND VERIFIED WITH: CRYSTAL SCARPENA 07/23/15 1400 MLM    Culture METHICILLIN RESISTANT STAPHYLOCOCCUS AUREUS  Final   Report Status 07/27/2015 FINAL  Final   Organism ID, Bacteria METHICILLIN RESISTANT STAPHYLOCOCCUS AUREUS  Final      Susceptibility   Methicillin resistant staphylococcus aureus - MIC*    CIPROFLOXACIN >=8 RESISTANT Resistant     ERYTHROMYCIN 0.5 SENSITIVE Sensitive     GENTAMICIN <=0.5 SENSITIVE Sensitive     OXACILLIN >=4 RESISTANT Resistant     VANCOMYCIN 1 SENSITIVE Sensitive     TRIMETH/SULFA <=10 SENSITIVE Sensitive     CLINDAMYCIN <=0.25 SENSITIVE Sensitive     CEFOXITIN SCREEN Value in next row Resistant      POSITIVECEFOXITIN SCREEN - This test may be used to predict mecA-mediated oxacillin resistance, and it is based on the cefoxitin disk screen test.  The cefoxitin screen and oxacillin work in combination to determine the final interpretation reported for oxacillin.     Inducible Clindamycin Value in next row Sensitive      POSITIVECEFOXITIN SCREEN - This test may be used to predict mecA-mediated oxacillin resistance, and it is based on the cefoxitin disk screen test.  The cefoxitin screen and oxacillin work in combination to determine the final interpretation reported for oxacillin.     TETRACYCLINE Value in next row Sensitive      SENSITIVE<=1    * METHICILLIN RESISTANT STAPHYLOCOCCUS AUREUS  Blood Culture  ID Panel (Reflexed)     Status: Abnormal   Collection Time: 07/22/15  6:21 PM  Result Value Ref Range Status   Enterococcus species NOT DETECTED NOT DETECTED Final   Listeria monocytogenes  NOT DETECTED NOT DETECTED Final   Staphylococcus species DETECTED (A) NOT DETECTED Final   Staphylococcus aureus DETECTED (A) NOT DETECTED Final    Comment: CRITICAL RESULT CALLED TO, READ BACK BY AND VERIFIED WITH: CRYSTAL SCARPENA 07/23/15 1330 ABOUT STAPH SPECIES,STAPH AUREUS, AND MecA DETECTED MLM    Streptococcus species NOT DETECTED NOT DETECTED Final   Streptococcus agalactiae NOT DETECTED NOT DETECTED Final   Streptococcus pneumoniae NOT DETECTED NOT DETECTED Final   Streptococcus pyogenes NOT DETECTED NOT DETECTED Final   Acinetobacter baumannii NOT DETECTED NOT DETECTED Final   Enterobacteriaceae species NOT DETECTED NOT DETECTED Final   Enterobacter cloacae complex NOT DETECTED NOT DETECTED Final   Escherichia coli NOT DETECTED NOT DETECTED Final   Klebsiella oxytoca NOT DETECTED NOT DETECTED Final   Klebsiella pneumoniae NOT DETECTED NOT DETECTED Final   Proteus species NOT DETECTED NOT DETECTED Final   Serratia marcescens NOT DETECTED NOT DETECTED Final   Haemophilus influenzae NOT DETECTED NOT DETECTED Final   Neisseria meningitidis NOT DETECTED NOT DETECTED Final   Pseudomonas aeruginosa NOT DETECTED NOT DETECTED Final   Candida albicans NOT DETECTED NOT DETECTED Final   Candida glabrata NOT DETECTED NOT DETECTED Final   Candida krusei NOT DETECTED NOT DETECTED Final   Candida parapsilosis NOT DETECTED NOT DETECTED Final   Candida tropicalis NOT DETECTED NOT DETECTED Final   Carbapenem resistance NOT DETECTED NOT DETECTED Final   Methicillin resistance DETECTED (A) NOT DETECTED Final   Vancomycin resistance NOT DETECTED NOT DETECTED Final  MRSA PCR Screening     Status: Abnormal   Collection Time: 07/23/15  5:24 AM  Result Value Ref Range Status   MRSA by PCR POSITIVE (A) NEGATIVE  Final    Comment:        The GeneXpert MRSA Assay (FDA approved for NASAL specimens only), is one component of a comprehensive MRSA colonization surveillance program. It is not intended to diagnose MRSA infection nor to guide or monitor treatment for MRSA infections. CRITICAL RESULT CALLED TO, READ BACK BY AND VERIFIED WITH: ALISHA BABB ON 07/23/15 AT 0735 BY QSD   CULTURE, BLOOD (ROUTINE X 2) w Reflex to PCR ID Panel     Status: None   Collection Time: 07/25/15  6:08 AM  Result Value Ref Range Status   Specimen Description BLOOD LEFT AC  Final   Special Requests BOTTLES DRAWN AEROBIC AND ANAEROBIC ANA2ML AER3ML  Final   Culture NO GROWTH 5 DAYS  Final   Report Status 07/30/2015 FINAL  Final  CULTURE, BLOOD (ROUTINE X 2) w Reflex to PCR ID Panel     Status: None   Collection Time: 07/25/15  6:08 AM  Result Value Ref Range Status   Specimen Description BLOOD LEFT WRIST  Final   Special Requests   Final    BOTTLES DRAWN AEROBIC AND ANAEROBIC ANA AER   Culture NO GROWTH 5 DAYS  Final   Report Status 07/30/2015 FINAL  Final  Cath Tip Culture     Status: None   Collection Time: 07/28/15  7:32 PM  Result Value Ref Range Status   Specimen Description CATH TIP  Final   Special Requests NONE  Final   Culture NO GROWTH 3 DAYS  Final   Report Status 07/31/2015 FINAL  Final  C difficile quick scan w PCR reflex     Status: Abnormal   Collection Time: 07/31/15 10:28 AM  Result Value Ref Range Status   C Diff antigen POSITIVE (A) NEGATIVE Final  C Diff toxin NEGATIVE NEGATIVE Final   C Diff interpretation   Final    Positive for toxigenic C. difficile, active toxin production not detected. Patient has toxigenic C. difficile organisms present in the bowel, but toxin was not detected. The patient may be a carrier or the level of toxin in the sample was below the limit  of detection. This information should be used in conjunction with the patient's clinical history when deciding on  possible therapy.   Clostridium Difficile by PCR     Status: Abnormal   Collection Time: 07/31/15 10:28 AM  Result Value Ref Range Status   Toxigenic C Difficile by pcr POSITIVE (A) NEGATIVE Final    Comment: CRITICAL RESULT CALLED TO, READ BACK BY AND VERIFIED WITH: TAYLOR BECK 07/31/15 AT 1211 VKB   Blood Culture (routine x 2)     Status: None (Preliminary result)   Collection Time: 08/08/15 10:08 PM  Result Value Ref Range Status   Specimen Description BLOOD LEFT ARM  Final   Special Requests BOTTLES DRAWN AEROBIC AND ANAEROBIC  Final   Culture NO GROWTH 4 DAYS  Final   Report Status PENDING  Incomplete  Blood Culture (routine x 2)     Status: None (Preliminary result)   Collection Time: 08/08/15 10:20 PM  Result Value Ref Range Status   Specimen Description BLOOD LEFT ANTECUBITAL  Final   Special Requests   Final    BOTTLES DRAWN AEROBIC AND ANAEROBIC ,   Culture NO GROWTH 4 DAYS  Final   Report Status PENDING  Incomplete    Coagulation Studies: No results for input(s): LABPROT, INR in the last 72 hours.  Urinalysis: No results for input(s): COLORURINE, LABSPEC, PHURINE, GLUCOSEU, HGBUR, BILIRUBINUR, KETONESUR, PROTEINUR, UROBILINOGEN, NITRITE, LEUKOCYTESUR in the last 72 hours.  Invalid input(s): APPERANCEUR    Imaging: Dg Chest 2 View  08/11/2015  CLINICAL DATA:  82 year old male with hypoxia, cough and shortness of breath. Patient with end-stage renal disease. EXAM: CHEST  2 VIEW COMPARISON:  08/10/2015 and prior studies FINDINGS: Cardiomegaly, left lower lung consolidation/atelectasis, right lower lung opacity and probable small effusions again noted. A right IJ central venous catheter is again identified with tips overlying the superior cavoatrial junction. There is no evidence of pneumothorax or other significant change. IMPRESSION: Little significant change with continued left lower lung consolidations/atelectasis, right lower lung opacity  and probable small effusions. Electronically Signed   By: Harmon Pier M.D.   On: 08/11/2015 19:37   Dg Chest 2 View  08/10/2015  CLINICAL DATA:  Pain patient with pneumonia diagnosis. Follow-up exam. History of hypertension, CHF and stroke. EXAM: CHEST  2 VIEW COMPARISON:  08/08/2015. FINDINGS: There is bibasilar lung opacity, some on the left and improved on the right when compared to the prior study. There are no new areas of lung consolidation. Cardiac silhouette is mildly enlarged. No mediastinal or hilar masses or convincing adenopathy. Right internal jugular dual-lumen central venous line is stable. IMPRESSION: 1. Mild improvement in right lung base consolidation. No change in left lung base consolidation. No evidence of pulmonary edema. No new abnormalities. Electronically Signed   By: Amie Portland M.D.   On: 08/10/2015 19:30     Medications:   . alteplase     . allopurinol  100 mg Oral Daily  . antiseptic oral rinse  7 mL Mouth Rinse BID  . aspirin EC  81 mg Oral Daily  . atorvastatin  20 mg Oral QHS  . brimonidine  1 drop Left Eye BID  . ceFEPime (MAXIPIME) IV  2 g Intravenous Q T,Th,Sa-HD  . clopidogrel  75 mg Oral Daily  . docusate sodium  100 mg Oral BID  . epoetin (EPOGEN/PROCRIT) injection  4,000 Units Intravenous Q T,Th,Sa-HD  . ferrous sulfate  325 mg Oral Daily  . guaiFENesin  600 mg Oral BID  . heparin  5,000 Units Subcutaneous 3 times per day  . latanoprost  1 drop Both Eyes QHS  . lidocaine  1 patch Transdermal Daily  . lidocaine  1 patch Transdermal Q24H  . megestrol  400 mg Oral BID  . metoprolol tartrate  25 mg Oral BID  . metroNIDAZOLE  500 mg Oral 3 times per day  . pneumococcal 23 valent vaccine  0.5 mL Intramuscular Tomorrow-1000  . pregabalin  75 mg Oral Daily  . sodium chloride flush  3 mL Intravenous Q12H  . tolnaftate  1 application Topical Daily   acetaminophen **OR** acetaminophen, alteplase, benzonatate, midodrine, morphine injection, ondansetron  **OR** ondansetron (ZOFRAN) IV  Assessment/ Plan:  82 y.o. black male  with right eye blindness, diastolic congestive heart failure, hypertension, gout, glaucoma, peripheral neuropathy, atrial fibrillation, anemia, carotid stenosis status post bilateral CEA, CVA, peripheral vascular disease   [facility]/ TTS  1. End Stage Renal Disease: tolerating treatment well. Continue TTS schedule.  - will plan on dialysis tomorrow - Midodrine prior to dialysis   2. Anemia of chronic kidney disease: Hemoglobin 9.3 - continue epogen IV with HD.  - Significant drop in hemoglobin is noted  3. Secondary Hyperparathyroidism:  - Phosphorus during this hospitalization  4. HTN Antihypertensives are being held    LOS: 3 Gracelee Stemmler 2/4/201711:52 AM

## 2015-08-12 NOTE — Progress Notes (Signed)
Pt demonstrated proper technique while using the flutter valve. Will continue to encourage usage of flutter valve.  Sean Delgado

## 2015-08-12 NOTE — Plan of Care (Signed)
Problem: Education: Goal: Knowledge of disease or condition will improve Outcome: Not Progressing Pt has demonstrated to RN that he is not cognitively able to understand plan of treatment at this time.

## 2015-08-12 NOTE — Progress Notes (Signed)
[facility] [place] at [facility]   PATIENT NAME: Sean Delgado    MR#:  [id number]  DATE OF BIRTH:  1934/03/07  SUBJECTIVE:  CHIEF COMPLAINT:   Chief Complaint  Patient presents with  . Shortness of Breath   -Feels much better today. Increased to 4l o2 by nasal cannula - Still has the cough. -Dialysis couldn't be done yesterday because the dialysis access was clotted off. Possible dialysis today as it was packed with TPA last night  REVIEW OF SYSTEMS:  Review of Systems  Constitutional: Positive for malaise/fatigue. Negative for fever and chills.  Respiratory: Positive for cough and shortness of breath. Negative for wheezing.   Cardiovascular: Negative for chest pain and palpitations.  Gastrointestinal: Negative for nausea, vomiting, abdominal pain, diarrhea and constipation.  Genitourinary: Negative for dysuria.  Musculoskeletal: Positive for myalgias.  Neurological: Negative for dizziness, seizures and headaches.  Psychiatric/Behavioral: Negative for depression.    DRUG ALLERGIES:  No Known Allergies  VITALS:  Blood pressure 135/53, pulse 120, temperature 97.3 F (36.3 C), temperature source Oral, resp. rate 18, height  (1.854 m), weight 99.701 kg (219 lb 12.8 oz), SpO2 91 %.  PHYSICAL EXAMINATION:  Physical Exam  GENERAL:  82 y.o.-year-old patient lying in the bed with no acute distress.  EYES: Pupils equal, round, reactive to light and accommodation. No scleral icterus. Extraocular muscles intact.  HEENT: Head atraumatic, normocephalic. Oropharynx and nasopharynx clear.  NECK:  Supple, no jugular venous distention. No thyroid enlargement, no tenderness.  LUNGS: Normal breath sounds bilaterally, no wheezing, rhonchi or crepitation. No use of accessory muscles of respiration. Fine bibasilar crackles. Decreased bibasilar breath sounds. CARDIOVASCULAR: S1, S2 normal. No murmurs, rubs, or gallops.  ABDOMEN: Soft, nontender, nondistended.  Bowel sounds present. No organomegaly or mass.  EXTREMITIES: No pedal edema, cyanosis, or clubbing.  NEUROLOGIC: Cranial nerves II through XII are intact. Muscle strength 5/5 in all extremities. Sensation intact. Gait not checked.  PSYCHIATRIC: The patient is alert and oriented x 3.  SKIN: No obvious rash, lesion, or ulcer.    LABORATORY PANEL:   CBC  Recent Labs Lab 08/12/15 0435  WBC 8.5  HGB 9.3*  HCT 30.5*  PLT 230   ------------------------------------------------------------------------------------------------------------------  Chemistries   Recent Labs Lab 08/08/15 2207  08/11/15 0449 08/12/15 0435  NA 140  < > 139 141  K 3.1*  < > 5.0 4.0  CL 97*  < > 104 104  CO2 33*  < > 24 32  GLUCOSE 104*  < > 133* 81  BUN 12  < > 26* 18  CREATININE 3.08*  < > 4.78* 3.49*  CALCIUM 7.5*  < > 7.5* 7.8*  MG  --   < > 2.0  --   AST 15  --   --   --   ALT 8*  --   --   --   ALKPHOS 68  --   --   --   BILITOT 0.6  --   --   --   < > = values in this interval not displayed. ------------------------------------------------------------------------------------------------------------------  Cardiac Enzymes  Recent Labs Lab 08/08/15 2207  TROPONINI 0.05*   ------------------------------------------------------------------------------------------------------------------  RADIOLOGY:  Dg Chest 2 View  08/11/2015  CLINICAL DATA:  82 year old male with hypoxia, cough and shortness of breath. Patient with end-stage renal disease. EXAM: CHEST  2 VIEW COMPARISON:  08/10/2015 and prior studies FINDINGS: Cardiomegaly, left lower lung consolidation/atelectasis, right lower lung opacity and probable small effusions again noted.  A right IJ central venous catheter is again identified with tips overlying the superior cavoatrial junction. There is no evidence of pneumothorax or other significant change. IMPRESSION: Little significant change with continued left lower lung  consolidations/atelectasis, right lower lung opacity and probable small effusions. Electronically Signed   By: Harmon Pier M.D.   On: 08/11/2015 19:37   Dg Chest 2 View  08/10/2015  CLINICAL DATA:  Pain patient with pneumonia diagnosis. Follow-up exam. History of hypertension, CHF and stroke. EXAM: CHEST  2 VIEW COMPARISON:  08/08/2015. FINDINGS: There is bibasilar lung opacity, some on the left and improved on the right when compared to the prior study. There are no new areas of lung consolidation. Cardiac silhouette is mildly enlarged. No mediastinal or hilar masses or convincing adenopathy. Right internal jugular dual-lumen central venous line is stable. IMPRESSION: 1. Mild improvement in right lung base consolidation. No change in left lung base consolidation. No evidence of pulmonary edema. No new abnormalities. Electronically Signed   By: Amie Portland M.D.   On: 08/10/2015 19:30    EKG:   Orders placed or performed during the hospital encounter of 08/08/15  . ED EKG 12-Lead  . ED EKG 12-Lead  . EKG 12-Lead  . EKG 12-Lead    ASSESSMENT AND PLAN:   82 year old male with past medical history significant for hypertension, congestive heart failure, coronary artery disease, atrial fibrillation, end-stage renal disease on hemodialysis presents to the hospital secondary to hypoxemia and also hypertension.  #1 Acute hypoxic respiratory failure-secondary to bilateral pneumonia -Chest x-ray with bibasilar pneumonia. Still worse on the left side. -Blood cultures have remained negative. Discontinue vancomycin. Continue cefepime for now -Still needing 3 L of oxygen. Patient usually only on 2 L at bedtime  #2 sepsis-secondary to pneumonia. -Off pressors -Discontinued midodrine. Low-dose metoprolol started. - On antibiotics  #3 end-stage renal disease on hemodialysis-appreciate nephrology consult. Patient getting dialysis per schedule. Dialysis held Thursday as his access was clotted, packed with  tPA and had dialysis yesterday. Dialysis likely tomorrow and then back on Tuesday per schedule.  #4 hypertension-metoprolol restarted as blood pressure is improved.  Cardizem and Imdur are on hold. -Low dose metoprolol started to help with his A. fib  #5 recent C. difficile colitis-continue Flagyl at this time. No diarrhea noted.  #6 coronary artery disease-stable at this time. No chest pain. Continue aspirin and statin. Low-dose metoprolol will be started. Also on Plavix  #7 DVT prophylaxis-on subcutaneous heparin   Patient is from [facility]. Possible discharge in the next 1-2 days   All the records are reviewed and case discussed with Care Management/Social Workerr. Management plans discussed with the patient, family and they are in agreement.  CODE STATUS: Full Code  TOTAL  TIME SPENT IN TAKING CARE OF THIS PATIENT: 38 minutes.   POSSIBLE D/C IN 1-2 DAYS, DEPENDING ON CLINICAL CONDITION.   Nechelle Petrizzo M.D on 08/12/2015 at 10:00 AM  Between 7am to 6pm - Pager - [phone]  After 6pm go to [email or web address] - password [email or web address] [facility]  [facility]  Office  [phone]  CC: Primary care physician; Bobbye Morton, MD

## 2015-08-13 LAB — CBC
HEMATOCRIT: 29.3 % — AB (ref 40.0–52.0)
Hemoglobin: 8.8 g/dL — ABNORMAL LOW (ref 13.0–18.0)
MCH: 27.2 pg (ref 26.0–34.0)
MCHC: 30 g/dL — ABNORMAL LOW (ref 32.0–36.0)
MCV: 90.5 fL (ref 80.0–100.0)
Platelets: 240 10*3/uL (ref 150–440)
RBC: 3.24 MIL/uL — AB (ref 4.40–5.90)
RDW: 22.2 % — AB (ref 11.5–14.5)
WBC: 8.6 10*3/uL (ref 3.8–10.6)

## 2015-08-13 LAB — BASIC METABOLIC PANEL
Anion gap: 9 (ref 5–15)
BUN: 25 mg/dL — AB (ref 6–20)
CHLORIDE: 104 mmol/L (ref 101–111)
CO2: 30 mmol/L (ref 22–32)
Calcium: 8 mg/dL — ABNORMAL LOW (ref 8.9–10.3)
Creatinine, Ser: 4.3 mg/dL — ABNORMAL HIGH (ref 0.61–1.24)
GFR calc Af Amer: 14 mL/min — ABNORMAL LOW (ref >60)
GFR calc non Af Amer: 12 mL/min — ABNORMAL LOW (ref >60)
Glucose, Bld: 91 mg/dL (ref 65–99)
POTASSIUM: 4.5 mmol/L (ref 3.5–5.1)
SODIUM: 143 mmol/L (ref 135–145)

## 2015-08-13 LAB — MAGNESIUM: Magnesium: 2 mg/dL (ref 1.7–2.4)

## 2015-08-13 MED ORDER — METOPROLOL TARTRATE 25 MG PO TABS
25.0000 mg | ORAL_TABLET | ORAL | Status: AC
  Administered 2015-08-13: 25 mg via ORAL

## 2015-08-13 NOTE — Progress Notes (Signed)
Patient is from [facility] [place] and will return at discharge.  CSW will follow patient and assist with ongoing and discharge needs.  Sammuel Hines. LCSWA Clinical Social Work Department [phone] 5:44 PM

## 2015-08-13 NOTE — Progress Notes (Signed)
Subjective:   Patient was originally sent from the outpatient dialysis center because of shortness of breath No complaints today Dialysis was done on Friday and Saturday successfully after TPA packing of catheter Patient tolerated the treatment well      Objective:  Vital signs in last 24 hours:  Temp:  [97.5 F (36.4 C)-98.3 F (36.8 C)] 97.9 F (36.6 C) (02/05 1211) Pulse Rate:  [88-123] 89 (02/05 1211) Resp:  [18-20] 20 (02/05 1211) BP: (120-134)/(55-64) 134/62 mmHg (02/05 1211) SpO2:  [93 %-97 %] 94 % (02/05 1211) Weight:  [99.973 kg (220 lb 6.4 oz)] 99.973 kg (220 lb 6.4 oz) (02/05 0500)  Weight change: 0.272 kg (9.6 oz) Filed Weights   08/10/15 1300 08/12/15 0500 08/13/15 0500  Weight: 96.7 kg (213 lb 3 oz) 99.701 kg (219 lb 12.8 oz) 99.973 kg (220 lb 6.4 oz)    Intake/Output:    Intake/Output Summary (Last 24 hours) at 08/13/15 1219 Last data filed at 08/13/15 0900  Gross per 24 hour  Intake    500 ml  Output      0 ml  Net    500 ml     Physical Exam: General:  no acute distress, laying in the bed   HEENT  anicteric, moist oral mucous membranes , right eye blindness  Neck  supple   Pulm/lungs  coarse breath sounds, normal effort  CVS/Heart  S1S2 irregular  Abdomen:   soft, nontender, nondistended   Extremities:  no peripheral edema   Neurologic:  alert,  follows commands  Skin:  no acute rashes   Access: RIJ permcath 08/02/15 Dr. Gilda Crease. Right AVF maturing with bruit and thrill       Basic Metabolic Panel:   Recent Labs Lab 08/09/15 0953 08/09/15 1710 08/10/15 0859 08/11/15 0449 08/12/15 0435 08/13/15 0520  NA 139  --  139 139 141 143  K 3.2*  --  5.0 5.0 4.0 4.5  CL 101  --  105 104 104 104  CO2 28  --  25 24 32 30  GLUCOSE 94  --  88 133* 81 91  BUN 15  --  21* 26* 18 25*  CREATININE 3.45*  --  4.24* 4.78* 3.49* 4.30*  CALCIUM 7.4*  --  7.6* 7.5* 7.8* 8.0*  MG  --  1.7  --  2.0  --  2.0  PHOS  --  4.4  --  5.3*  --   --       CBC:  Recent Labs Lab 08/08/15 2207 08/09/15 0953 08/10/15 0701 08/11/15 0449 08/12/15 0435 08/13/15 0520  WBC 12.3* 8.9 9.2 9.2 8.5 8.6  NEUTROABS 9.8*  --   --   --   --   --   HGB 9.1* 9.9* 8.6* 7.7* 9.3* 8.8*  HCT 30.4* 33.5* 29.3* 25.7* 30.5* 29.3*  MCV 86.0 88.4 90.4 89.1 90.2 90.5  PLT 293 260 247 221 230 240      Microbiology:  Recent Results (from the past 720 hour(s))  Culture, blood (routine x 2)     Status: None   Collection Time: 07/22/15  6:21 PM  Result Value Ref Range Status   Specimen Description BLOOD LEFT HAND  Final   Special Requests BOTTLES DRAWN AEROBIC AND ANAEROBIC  4CC  Final   Culture  Setup Time   Final    GRAM POSITIVE COCCI IN CLUSTERS AEROBIC BOTTLE ONLY CRITICAL RESULT CALLED TO, READ BACK BY AND VERIFIED WITH: CRYSTAL SCARPENA 07/23/15 1330 MLM  Culture   Final    METHICILLIN RESISTANT STAPHYLOCOCCUS AUREUS AEROBIC BOTTLE ONLY    Report Status 07/27/2015 FINAL  Final   Organism ID, Bacteria METHICILLIN RESISTANT STAPHYLOCOCCUS AUREUS  Final      Susceptibility   Methicillin resistant staphylococcus aureus - MIC*    CIPROFLOXACIN >=8 RESISTANT Resistant     ERYTHROMYCIN <=0.25 SENSITIVE Sensitive     GENTAMICIN <=0.5 SENSITIVE Sensitive     OXACILLIN >=4 RESISTANT Resistant     VANCOMYCIN 1 SENSITIVE Sensitive     TRIMETH/SULFA <=10 SENSITIVE Sensitive     CLINDAMYCIN <=0.25 SENSITIVE Sensitive     CEFOXITIN SCREEN Value in next row Resistant      POSITIVECEFOXITIN SCREEN - This test may be used to predict mecA-mediated oxacillin resistance, and it is based on the cefoxitin disk screen test.  The cefoxitin screen and oxacillin work in combination to determine the final interpretation reported for oxacillin.     Inducible Clindamycin Value in next row Sensitive      POSITIVECEFOXITIN SCREEN - This test may be used to predict mecA-mediated oxacillin resistance, and it is based on the cefoxitin disk screen test.  The cefoxitin  screen and oxacillin work in combination to determine the final interpretation reported for oxacillin.     TETRACYCLINE Value in next row Sensitive      SENSITIVE<=1    * METHICILLIN RESISTANT STAPHYLOCOCCUS AUREUS  Culture, blood (routine x 2)     Status: None   Collection Time: 07/22/15  6:21 PM  Result Value Ref Range Status   Specimen Description BLOOD LEFT HAND  Final   Special Requests   Final    BOTTLES DRAWN AEROBIC AND ANAEROBIC  AER 2CC ANA 4CC   Culture  Setup Time   Final    GRAM POSITIVE COCCI IN CLUSTERS IN BOTH AEROBIC AND ANAEROBIC BOTTLES CRITICAL RESULT CALLED TO, READ BACK BY AND VERIFIED WITH: CRYSTAL SCARPENA 07/23/15 1400 MLM    Culture METHICILLIN RESISTANT STAPHYLOCOCCUS AUREUS  Final   Report Status 07/27/2015 FINAL  Final   Organism ID, Bacteria METHICILLIN RESISTANT STAPHYLOCOCCUS AUREUS  Final      Susceptibility   Methicillin resistant staphylococcus aureus - MIC*    CIPROFLOXACIN >=8 RESISTANT Resistant     ERYTHROMYCIN 0.5 SENSITIVE Sensitive     GENTAMICIN <=0.5 SENSITIVE Sensitive     OXACILLIN >=4 RESISTANT Resistant     VANCOMYCIN 1 SENSITIVE Sensitive     TRIMETH/SULFA <=10 SENSITIVE Sensitive     CLINDAMYCIN <=0.25 SENSITIVE Sensitive     CEFOXITIN SCREEN Value in next row Resistant      POSITIVECEFOXITIN SCREEN - This test may be used to predict mecA-mediated oxacillin resistance, and it is based on the cefoxitin disk screen test.  The cefoxitin screen and oxacillin work in combination to determine the final interpretation reported for oxacillin.     Inducible Clindamycin Value in next row Sensitive      POSITIVECEFOXITIN SCREEN - This test may be used to predict mecA-mediated oxacillin resistance, and it is based on the cefoxitin disk screen test.  The cefoxitin screen and oxacillin work in combination to determine the final interpretation reported for oxacillin.     TETRACYCLINE Value in next row Sensitive      SENSITIVE<=1    * METHICILLIN  RESISTANT STAPHYLOCOCCUS AUREUS  Blood Culture ID Panel (Reflexed)     Status: Abnormal   Collection Time: 07/22/15  6:21 PM  Result Value Ref Range Status   Enterococcus species NOT DETECTED  NOT DETECTED Final   Listeria monocytogenes NOT DETECTED NOT DETECTED Final   Staphylococcus species DETECTED (A) NOT DETECTED Final   Staphylococcus aureus DETECTED (A) NOT DETECTED Final    Comment: CRITICAL RESULT CALLED TO, READ BACK BY AND VERIFIED WITH: CRYSTAL SCARPENA 07/23/15 1330 ABOUT STAPH SPECIES,STAPH AUREUS, AND MecA DETECTED MLM    Streptococcus species NOT DETECTED NOT DETECTED Final   Streptococcus agalactiae NOT DETECTED NOT DETECTED Final   Streptococcus pneumoniae NOT DETECTED NOT DETECTED Final   Streptococcus pyogenes NOT DETECTED NOT DETECTED Final   Acinetobacter baumannii NOT DETECTED NOT DETECTED Final   Enterobacteriaceae species NOT DETECTED NOT DETECTED Final   Enterobacter cloacae complex NOT DETECTED NOT DETECTED Final   Escherichia coli NOT DETECTED NOT DETECTED Final   Klebsiella oxytoca NOT DETECTED NOT DETECTED Final   Klebsiella pneumoniae NOT DETECTED NOT DETECTED Final   Proteus species NOT DETECTED NOT DETECTED Final   Serratia marcescens NOT DETECTED NOT DETECTED Final   Haemophilus influenzae NOT DETECTED NOT DETECTED Final   Neisseria meningitidis NOT DETECTED NOT DETECTED Final   Pseudomonas aeruginosa NOT DETECTED NOT DETECTED Final   Candida albicans NOT DETECTED NOT DETECTED Final   Candida glabrata NOT DETECTED NOT DETECTED Final   Candida krusei NOT DETECTED NOT DETECTED Final   Candida parapsilosis NOT DETECTED NOT DETECTED Final   Candida tropicalis NOT DETECTED NOT DETECTED Final   Carbapenem resistance NOT DETECTED NOT DETECTED Final   Methicillin resistance DETECTED (A) NOT DETECTED Final   Vancomycin resistance NOT DETECTED NOT DETECTED Final  MRSA PCR Screening     Status: Abnormal   Collection Time: 07/23/15  5:24 AM  Result Value Ref  Range Status   MRSA by PCR POSITIVE (A) NEGATIVE Final    Comment:        The GeneXpert MRSA Assay (FDA approved for NASAL specimens only), is one component of a comprehensive MRSA colonization surveillance program. It is not intended to diagnose MRSA infection nor to guide or monitor treatment for MRSA infections. CRITICAL RESULT CALLED TO, READ BACK BY AND VERIFIED WITH: ALISHA BABB ON 07/23/15 AT 0735 BY QSD   CULTURE, BLOOD (ROUTINE X 2) w Reflex to PCR ID Panel     Status: None   Collection Time: 07/25/15  6:08 AM  Result Value Ref Range Status   Specimen Description BLOOD LEFT AC  Final   Special Requests BOTTLES DRAWN AEROBIC AND ANAEROBIC ANA2ML AER3ML  Final   Culture NO GROWTH 5 DAYS  Final   Report Status 07/30/2015 FINAL  Final  CULTURE, BLOOD (ROUTINE X 2) w Reflex to PCR ID Panel     Status: None   Collection Time: 07/25/15  6:08 AM  Result Value Ref Range Status   Specimen Description BLOOD LEFT WRIST  Final   Special Requests   Final    BOTTLES DRAWN AEROBIC AND ANAEROBIC ANA AER   Culture NO GROWTH 5 DAYS  Final   Report Status 07/30/2015 FINAL  Final  Cath Tip Culture     Status: None   Collection Time: 07/28/15  7:32 PM  Result Value Ref Range Status   Specimen Description CATH TIP  Final   Special Requests NONE  Final   Culture NO GROWTH 3 DAYS  Final   Report Status 07/31/2015 FINAL  Final  C difficile quick scan w PCR reflex     Status: Abnormal   Collection Time: 07/31/15 10:28 AM  Result Value Ref Range Status   C  Diff antigen POSITIVE (A) NEGATIVE Final   C Diff toxin NEGATIVE NEGATIVE Final   C Diff interpretation   Final    Positive for toxigenic C. difficile, active toxin production not detected. Patient has toxigenic C. difficile organisms present in the bowel, but toxin was not detected. The patient may be a carrier or the level of toxin in the sample was below the limit  of detection. This information should be used in conjunction with  the patient's clinical history when deciding on possible therapy.   Clostridium Difficile by PCR     Status: Abnormal   Collection Time: 07/31/15 10:28 AM  Result Value Ref Range Status   Toxigenic C Difficile by pcr POSITIVE (A) NEGATIVE Final    Comment: CRITICAL RESULT CALLED TO, READ BACK BY AND VERIFIED WITH: TAYLOR BECK 07/31/15 AT 1211 VKB   Blood Culture (routine x 2)     Status: None (Preliminary result)   Collection Time: 08/08/15 10:08 PM  Result Value Ref Range Status   Specimen Description BLOOD LEFT ARM  Final   Special Requests BOTTLES DRAWN AEROBIC AND ANAEROBIC  Final   Culture NO GROWTH 4 DAYS  Final   Report Status PENDING  Incomplete  Blood Culture (routine x 2)     Status: None (Preliminary result)   Collection Time: 08/08/15 10:20 PM  Result Value Ref Range Status   Specimen Description BLOOD LEFT ANTECUBITAL  Final   Special Requests   Final    BOTTLES DRAWN AEROBIC AND ANAEROBIC ,   Culture NO GROWTH 4 DAYS  Final   Report Status PENDING  Incomplete    Coagulation Studies: No results for input(s): LABPROT, INR in the last 72 hours.  Urinalysis: No results for input(s): COLORURINE, LABSPEC, PHURINE, GLUCOSEU, HGBUR, BILIRUBINUR, KETONESUR, PROTEINUR, UROBILINOGEN, NITRITE, LEUKOCYTESUR in the last 72 hours.  Invalid input(s): APPERANCEUR    Imaging: Dg Chest 2 View  08/11/2015  CLINICAL DATA:  82 year old male with hypoxia, cough and shortness of breath. Patient with end-stage renal disease. EXAM: CHEST  2 VIEW COMPARISON:  08/10/2015 and prior studies FINDINGS: Cardiomegaly, left lower lung consolidation/atelectasis, right lower lung opacity and probable small effusions again noted. A right IJ central venous catheter is again identified with tips overlying the superior cavoatrial junction. There is no evidence of pneumothorax or other significant change. IMPRESSION: Little significant change with continued left lower lung  consolidations/atelectasis, right lower lung opacity and probable small effusions. Electronically Signed   By: Harmon Pier M.D.   On: 08/11/2015 19:37     Medications:   . alteplase     . allopurinol  100 mg Oral Daily  . antiseptic oral rinse  7 mL Mouth Rinse BID  . aspirin EC  81 mg Oral Daily  . atorvastatin  20 mg Oral QHS  . brimonidine  1 drop Left Eye BID  . ceFEPime (MAXIPIME) IV  2 g Intravenous Q T,Th,Sa-HD  . clopidogrel  75 mg Oral Daily  . docusate sodium  100 mg Oral BID  . epoetin (EPOGEN/PROCRIT) injection  4,000 Units Intravenous Q T,Th,Sa-HD  . ferrous sulfate  325 mg Oral Daily  . guaiFENesin  600 mg Oral BID  . heparin  5,000 Units Subcutaneous 3 times per day  . latanoprost  1 drop Both Eyes QHS  . lidocaine  1 patch Transdermal Daily  . lidocaine  1 patch Transdermal Q24H  . megestrol  400 mg Oral BID  . metoprolol tartrate  25 mg Oral  BID  . metroNIDAZOLE  500 mg Oral 3 times per day  . pneumococcal 23 valent vaccine  0.5 mL Intramuscular Tomorrow-1000  . pregabalin  75 mg Oral Daily  . sodium chloride flush  3 mL Intravenous Q12H  . tolnaftate  1 application Topical Daily   acetaminophen **OR** acetaminophen, alteplase, benzonatate, midodrine, morphine injection, ondansetron **OR** ondansetron (ZOFRAN) IV  Assessment/ Plan:  82 y.o. black male  with right eye blindness, diastolic congestive heart failure, hypertension, gout, glaucoma, peripheral neuropathy, atrial fibrillation, anemia, carotid stenosis status post bilateral CEA, CVA, peripheral vascular disease   [facility]/ TTS  1. End Stage Renal Disease: tolerating treatment well. Continue TTS schedule.  - will plan on routine dialysis on Tuesday - Midodrine prior to dialysis as needed   2. Anemia of chronic kidney disease: Hemoglobin 8.8 - continue epogen IV with HD.  - Significant drop in hemoglobin is noted  3. Secondary Hyperparathyroidism:  - Phosphorus  during this hospitalization  4. HTN Antihypertensives are being held    LOS: 4 Meher Kucinski 2/5/201712:19 PM

## 2015-08-13 NOTE — Progress Notes (Signed)
[facility] Physicians - [place] at [facility]   PATIENT NAME: Sean Delgado    MR#:  [id number]  DATE OF BIRTH:  1934/01/11  SUBJECTIVE:  CHIEF COMPLAINT:   Chief Complaint  Patient presents with  . Shortness of Breath   -Feels much better. On 3l o2 by nasal cannula - Improving cough. -Dialysis yesterday and tolerated well.   REVIEW OF SYSTEMS:  Review of Systems  Constitutional: Positive for malaise/fatigue. Negative for fever and chills.  Respiratory: Positive for cough and shortness of breath. Negative for wheezing.   Cardiovascular: Negative for chest pain and palpitations.  Gastrointestinal: Negative for nausea, vomiting, abdominal pain, diarrhea and constipation.  Genitourinary: Negative for dysuria.  Musculoskeletal: Positive for myalgias.  Neurological: Negative for dizziness, seizures and headaches.  Psychiatric/Behavioral: Negative for depression.    DRUG ALLERGIES:  No Known Allergies  VITALS:  Blood pressure 134/62, pulse 89, temperature 97.9 F (36.6 C), temperature source Oral, resp. rate 20, height  (1.854 m), weight 99.973 kg (220 lb 6.4 oz), SpO2 94 %.  PHYSICAL EXAMINATION:  Physical Exam  GENERAL:  82 y.o.-year-old patient lying in the bed with no acute distress.  EYES: Pupils equal, round, reactive to light and accommodation. No scleral icterus. Extraocular muscles intact.  HEENT: Head atraumatic, normocephalic. Oropharynx and nasopharynx clear.  NECK:  Supple, no jugular venous distention. No thyroid enlargement, no tenderness.  LUNGS: Normal breath sounds bilaterally, no wheezing, rhonchi or crepitation. No use of accessory muscles of respiration. Fine bibasilar crackles. Decreased bibasilar breath sounds. CARDIOVASCULAR: S1, S2 normal. No murmurs, rubs, or gallops.  ABDOMEN: Soft, nontender, nondistended. Bowel sounds present. No organomegaly or mass.  EXTREMITIES: No pedal edema, cyanosis, or clubbing.  NEUROLOGIC: Cranial  nerves II through XII are intact. Muscle strength 5/5 in all extremities. Sensation intact. Gait not checked.  PSYCHIATRIC: The patient is alert and oriented x 3.  SKIN: No obvious rash, lesion, or ulcer.    LABORATORY PANEL:   CBC  Recent Labs Lab 08/13/15 0520  WBC 8.6  HGB 8.8*  HCT 29.3*  PLT 240   ------------------------------------------------------------------------------------------------------------------  Chemistries   Recent Labs Lab 08/08/15 2207  08/13/15 0520  NA 140  < > 143  K 3.1*  < > 4.5  CL 97*  < > 104  CO2 33*  < > 30  GLUCOSE 104*  < > 91  BUN 12  < > 25*  CREATININE 3.08*  < > 4.30*  CALCIUM 7.5*  < > 8.0*  MG  --   < > 2.0  AST 15  --   --   ALT 8*  --   --   ALKPHOS 68  --   --   BILITOT 0.6  --   --   < > = values in this interval not displayed. ------------------------------------------------------------------------------------------------------------------  Cardiac Enzymes  Recent Labs Lab 08/08/15 2207  TROPONINI 0.05*   ------------------------------------------------------------------------------------------------------------------  RADIOLOGY:  Dg Chest 2 View  08/11/2015  CLINICAL DATA:  82 year old male with hypoxia, cough and shortness of breath. Patient with end-stage renal disease. EXAM: CHEST  2 VIEW COMPARISON:  08/10/2015 and prior studies FINDINGS: Cardiomegaly, left lower lung consolidation/atelectasis, right lower lung opacity and probable small effusions again noted. A right IJ central venous catheter is again identified with tips overlying the superior cavoatrial junction. There is no evidence of pneumothorax or other significant change. IMPRESSION: Little significant change with continued left lower lung consolidations/atelectasis, right lower lung opacity and probable small effusions. Electronically Signed  By: Harmon Pier M.D.   On: 08/11/2015 19:37    EKG:   Orders placed or performed during the hospital  encounter of 08/08/15  . ED EKG 12-Lead  . ED EKG 12-Lead  . EKG 12-Lead  . EKG 12-Lead    ASSESSMENT AND PLAN:   82 year old male with past medical history significant for hypertension, congestive heart failure, coronary artery disease, atrial fibrillation, end-stage renal disease on hemodialysis presents to the hospital secondary to hypoxemia and also hypertension.  #1 Acute hypoxic respiratory failure-secondary to bilateral pneumonia -Chest x-ray with bibasilar pneumonia. Still on the left side. -Blood cultures have remained negative. Discontinue vancomycin. Continue cefepime for now -Still needing 3 L of oxygen. Patient usually only on 2 L at bedtime - change to oral augmentin tomorrow and discharge  #2 sepsis-secondary to pneumonia. -Off pressors -Discontinued midodrine. Low-dose metoprolol started. - On antibiotics  #3 End-stage renal disease on hemodialysis-appreciate nephrology consult. Patient getting dialysis per schedule. Dialysis held Thursday as his access was clotted, packed with tPA - Had dialysis Friday and saturday - next dialysis on Tuesday per schedule.  #4 hypertension-metoprolol restarted as blood pressure is improved.  Cardizem and Imdur are on hold. - metoprolol started to help with his A. fib  #5 recent C. difficile colitis-continue Flagyl at this time. No diarrhea noted. Continue for 10 days after the other ABX are finished.  #6 coronary artery disease-stable at this time. No chest pain. Continue aspirin and statin. Low-dose metoprolol will be started. Also on Plavix  #7 DVT prophylaxis-on subcutaneous heparin   Patient is from [facility]. Possible discharge tomorrow if stable.  All the records are reviewed and case discussed with Care Management/Social Workerr. Management plans discussed with the patient, family and they are in agreement.  CODE STATUS: Full Code  TOTAL  TIME SPENT IN TAKING CARE OF THIS PATIENT: 38 minutes.   POSSIBLE D/C  TOMORROW, DEPENDING ON CLINICAL CONDITION.   Adedamola Seto M.D on 08/13/2015 at 12:22 PM  Between 7am to 6pm - Pager - [phone]  After 6pm go to [email or web address] - password [email or web address] [facility]  [facility] [place] [facility]  Office  [phone]  CC: Primary care physician; Bobbye Morton, MD

## 2015-08-13 NOTE — Progress Notes (Signed)
Dr. Jacqlyn Larsen was notified of onset of afib heart rate of 113-130. Order received metoprolol  PO once now.

## 2015-08-14 LAB — BASIC METABOLIC PANEL
ANION GAP: 10 (ref 5–15)
BUN: 31 mg/dL — ABNORMAL HIGH (ref 6–20)
CALCIUM: 8 mg/dL — AB (ref 8.9–10.3)
CHLORIDE: 104 mmol/L (ref 101–111)
CO2: 28 mmol/L (ref 22–32)
CREATININE: 4.76 mg/dL — AB (ref 0.61–1.24)
GFR calc Af Amer: 12 mL/min — ABNORMAL LOW (ref >60)
GFR calc non Af Amer: 11 mL/min — ABNORMAL LOW (ref >60)
GLUCOSE: 124 mg/dL — AB (ref 65–99)
Potassium: 5.2 mmol/L — ABNORMAL HIGH (ref 3.5–5.1)
Sodium: 142 mmol/L (ref 135–145)

## 2015-08-14 LAB — CBC
HEMATOCRIT: 28 % — AB (ref 40.0–52.0)
HEMOGLOBIN: 8.5 g/dL — AB (ref 13.0–18.0)
MCH: 27.3 pg (ref 26.0–34.0)
MCHC: 30.3 g/dL — AB (ref 32.0–36.0)
MCV: 90 fL (ref 80.0–100.0)
Platelets: 236 10*3/uL (ref 150–440)
RBC: 3.11 MIL/uL — ABNORMAL LOW (ref 4.40–5.90)
RDW: 22.7 % — AB (ref 11.5–14.5)
WBC: 8.9 10*3/uL (ref 3.8–10.6)

## 2015-08-14 MED ORDER — DILTIAZEM HCL 30 MG PO TABS
60.0000 mg | ORAL_TABLET | Freq: Three times a day (TID) | ORAL | Status: DC
  Administered 2015-08-14 (×4): 60 mg via ORAL
  Filled 2015-08-14 (×4): qty 2

## 2015-08-14 MED ORDER — AMOXICILLIN-POT CLAVULANATE 500-125 MG PO TABS: 1.0000 | ORAL_TABLET | Freq: Every day | ORAL | Status: AC

## 2015-08-14 MED ORDER — AMOXICILLIN-POT CLAVULANATE 500-125 MG PO TABS
1.0000 | ORAL_TABLET | Freq: Once | ORAL | Status: AC
  Administered 2015-08-14: 500 mg via ORAL
  Filled 2015-08-14: qty 1

## 2015-08-14 MED ORDER — DIPHENHYDRAMINE HCL 50 MG/ML IJ SOLN
12.5000 mg | Freq: Once | INTRAMUSCULAR | Status: AC
  Administered 2015-08-14: 12.5 mg via INTRAVENOUS
  Filled 2015-08-14: qty 1

## 2015-08-14 MED ORDER — DILTIAZEM HCL ER COATED BEADS 180 MG PO CP24: 180.0000 mg | ORAL_CAPSULE | Freq: Every day | ORAL | Status: DC

## 2015-08-14 MED ORDER — DEXTROSE 5 % IV SOLN
2.0000 g | Freq: Once | INTRAVENOUS | Status: DC
  Filled 2015-08-14: qty 2

## 2015-08-14 MED ORDER — AMOXICILLIN-POT CLAVULANATE 500-125 MG PO TABS: 1.0000 | ORAL_TABLET | ORAL | Status: DC

## 2015-08-14 MED ORDER — GUAIFENESIN ER 600 MG PO TB12: 600.0000 mg | ORAL_TABLET | Freq: Two times a day (BID) | ORAL | Status: AC

## 2015-08-14 MED ORDER — METRONIDAZOLE 500 MG PO TABS: 500.0000 mg | ORAL_TABLET | Freq: Three times a day (TID) | ORAL | Status: AC

## 2015-08-14 MED ORDER — IPRATROPIUM-ALBUTEROL 0.5-2.5 (3) MG/3ML IN SOLN: 3.0000 mL | Freq: Four times a day (QID) | RESPIRATORY_TRACT | Status: AC | PRN

## 2015-08-14 MED ORDER — FUROSEMIDE 10 MG/ML IJ SOLN
40.0000 mg | Freq: Once | INTRAMUSCULAR | Status: AC
  Administered 2015-08-14: 40 mg via INTRAVENOUS
  Filled 2015-08-14: qty 4

## 2015-08-14 MED ORDER — FUROSEMIDE 40 MG PO TABS: 40.0000 mg | ORAL_TABLET | Freq: Every day | ORAL | Status: AC

## 2015-08-14 MED ORDER — DOCUSATE SODIUM 100 MG PO CAPS: 100.0000 mg | ORAL_CAPSULE | Freq: Two times a day (BID) | ORAL | Status: AC

## 2015-08-14 MED ORDER — METOPROLOL TARTRATE 25 MG PO TABS: 25.0000 mg | ORAL_TABLET | Freq: Two times a day (BID) | ORAL | Status: AC

## 2015-08-14 MED ORDER — AMOXICILLIN-POT CLAVULANATE 500-125 MG PO TABS
1.0000 | ORAL_TABLET | Freq: Every day | ORAL | Status: DC
  Filled 2015-08-14: qty 1

## 2015-08-14 NOTE — Care Management Important Message (Signed)
Important Message  Patient Details  Name: Sean Delgado. MRN: [id number] Date of Birth: Nov 28, 1933   Medicare Important Message Given:  Yes    Chapman Fitch, RN 08/14/2015, 10:33 AM

## 2015-08-14 NOTE — Discharge Summary (Signed)
[facility] Physicians - [place] at [facility]   PATIENT NAME: Sean Delgado    MR#:  [id number]  DATE OF BIRTH:  18-Apr-1934  DATE OF ADMISSION:  08/08/2015 ADMITTING PHYSICIAN: Arnaldo Natal, MD  DATE OF DISCHARGE: 08/14/2015  PRIMARY CARE PHYSICIAN: Bobbye Morton, MD    ADMISSION DIAGNOSIS:  HCAP (healthcare-associated pneumonia) [J18.9] Severe sepsis (HCC) [A41.9, R65.20]  DISCHARGE DIAGNOSIS:  Active Problems:   Sepsis (HCC)   HCAP (healthcare-associated pneumonia)   SECONDARY DIAGNOSIS:   Past Medical History  Diagnosis Date  . ESRD (end stage renal disease) (HCC)   . Hypertension   . CHF (congestive heart failure) (HCC)   . Gout   . Glaucoma   . Neuropathy (HCC)   . Stroke (HCC)   . Peripheral vascular disease (HCC)   . Coronary artery disease   . A-fib (HCC)   . Dysrhythmia   . Blindness of right eye   . Anemia     iron deficiency    HOSPITAL COURSE:   82 year old male with past medical history significant for hypertension, congestive heart failure, coronary artery disease, atrial fibrillation, end-stage renal disease on hemodialysis presents to the hospital secondary to hypoxemia and also hypertension.  #1 Acute hypoxic respiratory failure-secondary to bilateral pneumonia -Chest x-ray with bibasilar pneumonia. Still on the left side. -Blood cultures have remained negative. On oral Augmentin now. Renally dosed. -Still needing 3 L of oxygen. Patient usually only on 2 L at bedtime  #2 sepsis-secondary to pneumonia. -Off pressors -Discontinued midodrine.  - On antibiotics  #3 End-stage renal disease on hemodialysis-appreciate nephrology consult. Patient getting dialysis per schedule. Dialysis held Thursday as his access was clotted, packed with tPA - Had dialysis Friday and saturday - next dialysis on Tuesday per schedule. -Will be on Lasix as well  #4 hypertension-metoprolol and Cardizem restarted as blood pressure is improved.   - They are started to help with his A. fib  #5 recent C. difficile colitis-continue Flagyl at this time. No diarrhea noted. Continue for 10 days after Augmentin is finished.  #6 coronary artery disease-stable at this time. No chest pain. Continue aspirin and statin. Low-dose metoprolol will be started. Also on Plavix  Patient is actually from [facility] and will be discharged today.  DISCHARGE CONDITIONS:   Stable  CONSULTS OBTAINED:    nephrology consultation  DRUG ALLERGIES:  No Known Allergies  DISCHARGE MEDICATIONS:   Current Discharge Medication List    START taking these medications   Details  amoxicillin-clavulanate (AUGMENTIN) 500-125 MG tablet Take 1 tablet (500 mg total) by mouth daily. X 7 more days Qty: 7 tablet, Refills: 0    docusate sodium (COLACE) 100 MG capsule Take 1 capsule (100 mg total) by mouth 2 (two) times daily. Qty: 10 capsule, Refills: 0    guaiFENesin (MUCINEX) 600 MG 12 hr tablet Take 1 tablet (600 mg total) by mouth 2 (two) times daily. Qty: 10 tablet, Refills: 0    ipratropium-albuterol (DUONEB) 0.5-2.5 (3) MG/3ML SOLN Take 3 mLs by nebulization every 6 (six) hours as needed (wheezing, shortness of breath). Qty: 360 mL, Refills: 0    metoprolol tartrate (LOPRESSOR) 25 MG tablet Take 1 tablet (25 mg total) by mouth 2 (two) times daily. Qty: 60 tablet, Refills: 0      CONTINUE these medications which have CHANGED   Details  furosemide (LASIX) 40 MG tablet Take 1 tablet (40 mg total) by mouth daily. Qty: 30 tablet, Refills: 0  metroNIDAZOLE (FLAGYL) 500 MG tablet Take 1 tablet (500 mg total) by mouth every 8 (eight) hours. CONTINUE FOR 10 DAYS AFTER FINISHING AUGMENTIN      CONTINUE these medications which have NOT CHANGED   Details  acetaminophen (TYLENOL) 325 MG tablet Take 650 mg by mouth every 6 (six) hours as needed for mild pain or moderate pain. *note dose*    allopurinol (ZYLOPRIM) 100 MG tablet Take 100 mg by mouth  daily.    aspirin EC 81 MG tablet Take 81 mg by mouth daily. Reported on 06/23/2015    atorvastatin (LIPITOR) 20 MG tablet Take 1 tablet (20 mg total) by mouth daily. Qty: 30 tablet, Refills: 0    b complex-vitamin c-folic acid (NEPHRO-VITE) 0.8 MG TABS tablet Take 1 tablet by mouth daily.    benzonatate (TESSALON) 100 MG capsule Take 1 capsule (100 mg total) by mouth 3 (three) times daily as needed for cough. Qty: 20 capsule, Refills: 0    brimonidine (ALPHAGAN) 0.2 % ophthalmic solution Place 1 drop into the left eye 2 (two) times daily.    clopidogrel (PLAVIX) 75 MG tablet Take 75 mg by mouth daily.    diltiazem (CARDIZEM CD) 180 MG 24 hr capsule Take 1 capsule (180 mg total) by mouth daily. Qty: 30 capsule, Refills: 0    ferrous sulfate 325 (65 FE) MG EC tablet Take 325 mg by mouth daily.    latanoprost (XALATAN) 0.005 % ophthalmic solution Place 1 drop into both eyes at bedtime.    lidocaine (LIDODERM) 5 % Place 1 patch onto the skin daily. Apply to right knee and right buttock at 6am. Remove & Discard patch within 12 hours or as directed by MD    megestrol (MEGACE) 400 MG/10ML suspension Take 10 mLs (400 mg total) by mouth 2 (two) times daily. Qty: 240 mL, Refills: 0    pregabalin (LYRICA) 75 MG capsule Take 75 mg by mouth daily.     sodium bicarbonate 650 MG tablet Take 650 mg by mouth 2 (two) times daily.    tolnaftate (TINACTIN) 1 % powder Apply 1 application topically daily. Apply on both feet.      STOP taking these medications     isosorbide mononitrate (IMDUR) 60 MG 24 hr tablet      metoprolol (TOPROL-XL) 200 MG 24 hr tablet      Vancomycin (VANCOCIN) 750 MG/150ML SOLN          DISCHARGE INSTRUCTIONS:   1. For dialysis tomorrow 2. PCP follow-up in 1-2 weeks 3. Continue incentive spirometry 4. Continue continuous 2-3 L oxygen  If you experience worsening of your admission symptoms, develop shortness of breath, life threatening emergency, suicidal or  homicidal thoughts you must seek medical attention immediately by calling 911 or calling your MD immediately  if symptoms less severe.  You Must read complete instructions/literature along with all the possible adverse reactions/side effects for all the Medicines you take and that have been prescribed to you. Take any new Medicines after you have completely understood and accept all the possible adverse reactions/side effects.   Please note  You were cared for by a hospitalist during your hospital stay. If you have any questions about your discharge medications or the care you received while you were in the hospital after you are discharged, you can call the unit and asked to speak with the hospitalist on call if the hospitalist that took care of you is not available. Once you are discharged, your primary care physician  will handle any further medical issues. Please note that NO REFILLS for any discharge medications will be authorized once you are discharged, as it is imperative that you return to your primary care physician (or establish a relationship with a primary care physician if you do not have one) for your aftercare needs so that they can reassess your need for medications and monitor your lab values.    Today   CHIEF COMPLAINT:   Chief Complaint  Patient presents with  . Shortness of Breath     VITAL SIGNS:  Blood pressure 114/52, pulse 88, temperature 99 F (37.2 C), temperature source Oral, resp. rate 20, height  (1.854 m), weight 99.701 kg (219 lb 12.8 oz), SpO2 96 %.  I/O:   Intake/Output Summary (Last 24 hours) at 08/14/15 1045 Last data filed at 08/14/15 0548  Gross per 24 hour  Intake    875 ml  Output      0 ml  Net    875 ml    PHYSICAL EXAMINATION:   Physical Exam  GENERAL: 82 y.o.-year-old patient lying in the bed with no acute distress.  EYES: Pupils equal, round, reactive to light and accommodation. No scleral icterus. Extraocular muscles intact.   HEENT: Head atraumatic, normocephalic. Oropharynx and nasopharynx clear.  NECK: Supple, no jugular venous distention. No thyroid enlargement, no tenderness.  LUNGS: Normal breath sounds bilaterally, no wheezing, rhonchi or crepitation. No use of accessory muscles of respiration. Fine bibasilar crackles. Decreased bibasilar breath sounds. CARDIOVASCULAR: S1, S2 normal. No murmurs, rubs, or gallops.  ABDOMEN: Soft, nontender, nondistended. Bowel sounds present. No organomegaly or mass.  EXTREMITIES: No pedal edema, cyanosis, or clubbing.  NEUROLOGIC: Cranial nerves II through XII are intact. Muscle strength 5/5 in all extremities. Sensation intact. Gait not checked.  PSYCHIATRIC: The patient is alert and oriented x 2-3.  SKIN: No obvious rash, lesion, or ulcer.   DATA REVIEW:   CBC  Recent Labs Lab 08/14/15 0620  WBC 8.9  HGB 8.5*  HCT 28.0*  PLT 236    Chemistries   Recent Labs Lab 08/08/15 2207  08/13/15 0520 08/14/15 0620  NA 140  < > 143 142  K 3.1*  < > 4.5 5.2*  CL 97*  < > 104 104  CO2 33*  < > 30 28  GLUCOSE 104*  < > 91 124*  BUN 12  < > 25* 31*  CREATININE 3.08*  < > 4.30* 4.76*  CALCIUM 7.5*  < > 8.0* 8.0*  MG  --   < > 2.0  --   AST 15  --   --   --   ALT 8*  --   --   --   ALKPHOS 68  --   --   --   BILITOT 0.6  --   --   --   < > = values in this interval not displayed.  Cardiac Enzymes  Recent Labs Lab 08/08/15 2207  TROPONINI 0.05*    Microbiology Results  Results for orders placed or performed during the hospital encounter of 08/08/15  Blood Culture (routine x 2)     Status: None (Preliminary result)   Collection Time: 08/08/15 10:08 PM  Result Value Ref Range Status   Specimen Description BLOOD LEFT ARM  Final   Special Requests BOTTLES DRAWN AEROBIC AND ANAEROBIC  Final   Culture NO GROWTH 4 DAYS  Final   Report Status PENDING  Incomplete  Blood Culture (routine x 2)  Status: None (Preliminary result)   Collection Time:  08/08/15 10:20 PM  Result Value Ref Range Status   Specimen Description BLOOD LEFT ANTECUBITAL  Final   Special Requests   Final    BOTTLES DRAWN AEROBIC AND ANAEROBIC ,   Culture NO GROWTH 4 DAYS  Final   Report Status PENDING  Incomplete    RADIOLOGY:  No results found.  EKG:   Orders placed or performed during the hospital encounter of 08/08/15  . ED EKG 12-Lead  . ED EKG 12-Lead  . EKG 12-Lead  . EKG 12-Lead      Management plans discussed with the patient, family and they are in agreement.  CODE STATUS:     Code Status Orders        Start     Ordered   08/09/15 0847  Limited resuscitation (code)   Continuous    Question Answer Comment  In the event of cardiac or respiratory ARREST: Initiate Code Blue, Call Rapid Response Yes   In the event of cardiac or respiratory ARREST: Perform CPR Yes   In the event of cardiac or respiratory ARREST: Perform Intubation/Mechanical Ventilation No   In the event of cardiac or respiratory ARREST: Use NIPPV/BiPAp only if indicated Yes   In the event of cardiac or respiratory ARREST: Administer ACLS medications if indicated Yes   In the event of cardiac or respiratory ARREST: Perform Defibrillation or Cardioversion if indicated Yes      08/09/15 0847    Code Status History    Date Active Date Inactive Code Status Order ID Comments User Context   08/09/2015  2:42 AM 08/09/2015  8:47 AM Full Code [id number]  Arnaldo Natal, MD Inpatient   07/22/2015  8:38 PM 08/04/2015  2:06 PM Full Code [id number]  Altamese Dilling, MD Inpatient   06/24/2015  4:07 PM 06/27/2015 12:37 AM Full Code [id number]  Auburn Bilberry, MD Inpatient   05/30/2015  5:22 PM 06/05/2015  6:42 PM Full Code [id number]  Trudee Kuster, RN Inpatient   05/30/2015  1:59 PM 05/30/2015  5:22 PM DNR [id number]  Shaune Pollack, MD Inpatient   05/29/2015  3:57 PM 05/29/2015  7:32 PM Full Code [id number]  Annice Needy, MD Inpatient   04/08/2015  8:31 PM  04/17/2015 11:42 PM Full Code [id number]  Gracelyn Nurse, MD Inpatient   03/04/2015  4:13 PM 03/11/2015  7:00 PM DNR [id number]  Houston Siren, MD Inpatient    Advance Directive Documentation        Most Recent Value   Type of Advance Directive  Living will   Pre-existing out of facility DNR order (yellow form or pink MOST form)     "MOST" Form in Place?        TOTAL TIME TAKING CARE OF THIS PATIENT: 37 minutes.    Andres Vest M.D on 08/14/2015 at 10:45 AM  Between 7am to 6pm - Pager - [phone]  After 6pm go to [email or web address] - password [email or web address] [facility]  [place] [facility]  Office  [phone]  CC: Primary care physician; Bobbye Morton, MD

## 2015-08-14 NOTE — Progress Notes (Signed)
Pt dressed and ready to d/c. HR elevated this afternoon. Cardizem dose given and HR presently sustaining in low 100s with his hx of afib. Pt resting comfortable. Report called to facility. EMS called for transport.

## 2015-08-14 NOTE — Care Management (Signed)
Patient to discharge back to [facility] today.  CSW facilitating.  Patient is followed by [facility]. Patient to be transported. No RNCM needs identified.

## 2015-08-14 NOTE — Progress Notes (Signed)
Clinical Social Worker informed by Enid Baas, MD that patient is medically ready to discharge back to SNF, Patient and  Niece Ms Rosezetta Schlatter [phone] are in a agreement with plan.  Call to [facility] to inform them patient will discharge back to [facility].  They are unable to transport as patient is unable to sit up in wheel chair. Call to [facility] to confirm that patient's bed is ready. Provided patient's room number 304 and number to call for report [phone] . All discharge information faxed to  Facility. No Rx's.   RN will call report and patient will discharge to [facility] via EMS.  Sammuel Hines. LCSWA Clinical Social Work Department [phone] 11:22 AM

## 2015-08-14 NOTE — Progress Notes (Signed)
Subjective:  Patient resting comfortably in bed. He is due for hemodialysis again tomorrow.      Objective:  Vital signs in last 24 hours:  Temp:  [98 F (36.7 C)-99 F (37.2 C)] 98.9 F (37.2 C) (02/06 1318) Pulse Rate:  [77-138] 124 (02/06 1318) Resp:  [18-20] 20 (02/06 1318) BP: (106-126)/(52-73) 122/60 mmHg (02/06 1318) SpO2:  [88 %-99 %] 95 % (02/06 1318) Weight:  [99.701 kg (219 lb 12.8 oz)] 99.701 kg (219 lb 12.8 oz) (02/06 0231)  Weight change: -0.272 kg (-9.6 oz) Filed Weights   08/12/15 0500 08/13/15 0500 08/14/15 0231  Weight: 99.701 kg (219 lb 12.8 oz) 99.973 kg (220 lb 6.4 oz) 99.701 kg (219 lb 12.8 oz)    Intake/Output:    Intake/Output Summary (Last 24 hours) at 08/14/15 1624 Last data filed at 08/14/15 0548  Gross per 24 hour  Intake    318 ml  Output      0 ml  Net    318 ml     Physical Exam: General:  no acute distress, laying in the bed   HEENT  anicteric, moist oral mucous membranes , right eye blindness  Neck  supple   Pulm/lungs  coarse breath sounds, normal effort  CVS/Heart  S1S2 irregular  Abdomen:   soft, nontender, nondistended   Extremities:  no peripheral edema   Neurologic:  alert,  follows commands  Skin:  no acute rashes   Access: RIJ permcath 08/02/15 Dr. Gilda Crease. Right AVF maturing with bruit and thrill       Basic Metabolic Panel:   Recent Labs Lab 08/09/15 1710 08/10/15 0859 08/11/15 0449 08/12/15 0435 08/13/15 0520 08/14/15 0620  NA  --  139 139 141 143 142  K  --  5.0 5.0 4.0 4.5 5.2*  CL  --  105 104 104 104 104  CO2  --  25 24 32 30 28  GLUCOSE  --  88 133* 81 91 124*  BUN  --  21* 26* 18 25* 31*  CREATININE  --  4.24* 4.78* 3.49* 4.30* 4.76*  CALCIUM  --  7.6* 7.5* 7.8* 8.0* 8.0*  MG 1.7  --  2.0  --  2.0  --   PHOS 4.4  --  5.3*  --   --   --      CBC:  Recent Labs Lab 08/08/15 2207  08/10/15 0701 08/11/15 0449 08/12/15 0435 08/13/15 0520 08/14/15 0620  WBC 12.3*  < > 9.2 9.2 8.5 8.6 8.9   NEUTROABS 9.8*  --   --   --   --   --   --   HGB 9.1*  < > 8.6* 7.7* 9.3* 8.8* 8.5*  HCT 30.4*  < > 29.3* 25.7* 30.5* 29.3* 28.0*  MCV 86.0  < > 90.4 89.1 90.2 90.5 90.0  PLT 293  < > 247 221 230 240 236  < > = values in this interval not displayed.    Microbiology:  Recent Results (from the past 720 hour(s))  Culture, blood (routine x 2)     Status: None   Collection Time: 07/22/15  6:21 PM  Result Value Ref Range Status   Specimen Description BLOOD LEFT HAND  Final   Special Requests BOTTLES DRAWN AEROBIC AND ANAEROBIC  4CC  Final   Culture  Setup Time   Final    GRAM POSITIVE COCCI IN CLUSTERS AEROBIC BOTTLE ONLY CRITICAL RESULT CALLED TO, READ BACK BY AND VERIFIED WITH: CRYSTAL [facility] 07/23/15  1330 MLM    Culture   Final    METHICILLIN RESISTANT STAPHYLOCOCCUS AUREUS AEROBIC BOTTLE ONLY    Report Status 07/27/2015 FINAL  Final   Organism ID, Bacteria METHICILLIN RESISTANT STAPHYLOCOCCUS AUREUS  Final      Susceptibility   Methicillin resistant staphylococcus aureus - MIC*    CIPROFLOXACIN >=8 RESISTANT Resistant     ERYTHROMYCIN <=0.25 SENSITIVE Sensitive     GENTAMICIN <=0.5 SENSITIVE Sensitive     OXACILLIN >=4 RESISTANT Resistant     VANCOMYCIN 1 SENSITIVE Sensitive     TRIMETH/SULFA <=10 SENSITIVE Sensitive     CLINDAMYCIN <=0.25 SENSITIVE Sensitive     CEFOXITIN SCREEN Value in next row Resistant      POSITIVECEFOXITIN SCREEN - This test may be used to predict mecA-mediated oxacillin resistance, and it is based on the cefoxitin disk screen test.  The cefoxitin screen and oxacillin work in combination to determine the final interpretation reported for oxacillin.     Inducible Clindamycin Value in next row Sensitive      POSITIVECEFOXITIN SCREEN - This test may be used to predict mecA-mediated oxacillin resistance, and it is based on the cefoxitin disk screen test.  The cefoxitin screen and oxacillin work in combination to determine the final interpretation reported  for oxacillin.     TETRACYCLINE Value in next row Sensitive      SENSITIVE<=1    * METHICILLIN RESISTANT STAPHYLOCOCCUS AUREUS  Culture, blood (routine x 2)     Status: None   Collection Time: 07/22/15  6:21 PM  Result Value Ref Range Status   Specimen Description BLOOD LEFT HAND  Final   Special Requests   Final    BOTTLES DRAWN AEROBIC AND ANAEROBIC  AER 2CC ANA 4CC   Culture  Setup Time   Final    GRAM POSITIVE COCCI IN CLUSTERS IN BOTH AEROBIC AND ANAEROBIC BOTTLES CRITICAL RESULT CALLED TO, READ BACK BY AND VERIFIED WITH: CRYSTAL SCARPENA 07/23/15 1400 MLM    Culture METHICILLIN RESISTANT STAPHYLOCOCCUS AUREUS  Final   Report Status 07/27/2015 FINAL  Final   Organism ID, Bacteria METHICILLIN RESISTANT STAPHYLOCOCCUS AUREUS  Final      Susceptibility   Methicillin resistant staphylococcus aureus - MIC*    CIPROFLOXACIN >=8 RESISTANT Resistant     ERYTHROMYCIN 0.5 SENSITIVE Sensitive     GENTAMICIN <=0.5 SENSITIVE Sensitive     OXACILLIN >=4 RESISTANT Resistant     VANCOMYCIN 1 SENSITIVE Sensitive     TRIMETH/SULFA <=10 SENSITIVE Sensitive     CLINDAMYCIN <=0.25 SENSITIVE Sensitive     CEFOXITIN SCREEN Value in next row Resistant      POSITIVECEFOXITIN SCREEN - This test may be used to predict mecA-mediated oxacillin resistance, and it is based on the cefoxitin disk screen test.  The cefoxitin screen and oxacillin work in combination to determine the final interpretation reported for oxacillin.     Inducible Clindamycin Value in next row Sensitive      POSITIVECEFOXITIN SCREEN - This test may be used to predict mecA-mediated oxacillin resistance, and it is based on the cefoxitin disk screen test.  The cefoxitin screen and oxacillin work in combination to determine the final interpretation reported for oxacillin.     TETRACYCLINE Value in next row Sensitive      SENSITIVE<=1    * METHICILLIN RESISTANT STAPHYLOCOCCUS AUREUS  Blood Culture ID Panel (Reflexed)     Status: Abnormal    Collection Time: 07/22/15  6:21 PM  Result Value Ref Range Status  Enterococcus species NOT DETECTED NOT DETECTED Final   Listeria monocytogenes NOT DETECTED NOT DETECTED Final   Staphylococcus species DETECTED (A) NOT DETECTED Final   Staphylococcus aureus DETECTED (A) NOT DETECTED Final    Comment: CRITICAL RESULT CALLED TO, READ BACK BY AND VERIFIED WITH: CRYSTAL SCARPENA 07/23/15 1330 ABOUT STAPH SPECIES,STAPH AUREUS, AND MecA DETECTED MLM    Streptococcus species NOT DETECTED NOT DETECTED Final   Streptococcus agalactiae NOT DETECTED NOT DETECTED Final   Streptococcus pneumoniae NOT DETECTED NOT DETECTED Final   Streptococcus pyogenes NOT DETECTED NOT DETECTED Final   Acinetobacter baumannii NOT DETECTED NOT DETECTED Final   Enterobacteriaceae species NOT DETECTED NOT DETECTED Final   Enterobacter cloacae complex NOT DETECTED NOT DETECTED Final   Escherichia coli NOT DETECTED NOT DETECTED Final   Klebsiella oxytoca NOT DETECTED NOT DETECTED Final   Klebsiella pneumoniae NOT DETECTED NOT DETECTED Final   Proteus species NOT DETECTED NOT DETECTED Final   Serratia marcescens NOT DETECTED NOT DETECTED Final   Haemophilus influenzae NOT DETECTED NOT DETECTED Final   Neisseria meningitidis NOT DETECTED NOT DETECTED Final   Pseudomonas aeruginosa NOT DETECTED NOT DETECTED Final   Candida albicans NOT DETECTED NOT DETECTED Final   Candida glabrata NOT DETECTED NOT DETECTED Final   Candida krusei NOT DETECTED NOT DETECTED Final   Candida parapsilosis NOT DETECTED NOT DETECTED Final   Candida tropicalis NOT DETECTED NOT DETECTED Final   Carbapenem resistance NOT DETECTED NOT DETECTED Final   Methicillin resistance DETECTED (A) NOT DETECTED Final   Vancomycin resistance NOT DETECTED NOT DETECTED Final  MRSA PCR Screening     Status: Abnormal   Collection Time: 07/23/15  5:24 AM  Result Value Ref Range Status   MRSA by PCR POSITIVE (A) NEGATIVE Final    Comment:        The GeneXpert  MRSA Assay (FDA approved for NASAL specimens only), is one component of a comprehensive MRSA colonization surveillance program. It is not intended to diagnose MRSA infection nor to guide or monitor treatment for MRSA infections. CRITICAL RESULT CALLED TO, READ BACK BY AND VERIFIED WITH: ALISHA BABB ON 07/23/15 AT 0735 BY QSD   CULTURE, BLOOD (ROUTINE X 2) w Reflex to PCR ID Panel     Status: None   Collection Time: 07/25/15  6:08 AM  Result Value Ref Range Status   Specimen Description BLOOD LEFT AC  Final   Special Requests BOTTLES DRAWN AEROBIC AND ANAEROBIC ANA2ML AER3ML  Final   Culture NO GROWTH 5 DAYS  Final   Report Status 07/30/2015 FINAL  Final  CULTURE, BLOOD (ROUTINE X 2) w Reflex to PCR ID Panel     Status: None   Collection Time: 07/25/15  6:08 AM  Result Value Ref Range Status   Specimen Description BLOOD LEFT WRIST  Final   Special Requests   Final    BOTTLES DRAWN AEROBIC AND ANAEROBIC ANA AER   Culture NO GROWTH 5 DAYS  Final   Report Status 07/30/2015 FINAL  Final  Cath Tip Culture     Status: None   Collection Time: 07/28/15  7:32 PM  Result Value Ref Range Status   Specimen Description CATH TIP  Final   Special Requests NONE  Final   Culture NO GROWTH 3 DAYS  Final   Report Status 07/31/2015 FINAL  Final  C difficile quick scan w PCR reflex     Status: Abnormal   Collection Time: 07/31/15 10:28 AM  Result Value Ref Range  Status   C Diff antigen POSITIVE (A) NEGATIVE Final   C Diff toxin NEGATIVE NEGATIVE Final   C Diff interpretation   Final    Positive for toxigenic C. difficile, active toxin production not detected. Patient has toxigenic C. difficile organisms present in the bowel, but toxin was not detected. The patient may be a carrier or the level of toxin in the sample was below the limit  of detection. This information should be used in conjunction with the patient's clinical history when deciding on possible therapy.   Clostridium  Difficile by PCR     Status: Abnormal   Collection Time: 07/31/15 10:28 AM  Result Value Ref Range Status   Toxigenic C Difficile by pcr POSITIVE (A) NEGATIVE Final    Comment: CRITICAL RESULT CALLED TO, READ BACK BY AND VERIFIED WITH: TAYLOR BECK 07/31/15 AT 1211 VKB   Blood Culture (routine x 2)     Status: None (Preliminary result)   Collection Time: 08/08/15 10:08 PM  Result Value Ref Range Status   Specimen Description BLOOD LEFT ARM  Final   Special Requests BOTTLES DRAWN AEROBIC AND ANAEROBIC  Final   Culture NO GROWTH 4 DAYS  Final   Report Status PENDING  Incomplete  Blood Culture (routine x 2)     Status: None (Preliminary result)   Collection Time: 08/08/15 10:20 PM  Result Value Ref Range Status   Specimen Description BLOOD LEFT ANTECUBITAL  Final   Special Requests   Final    BOTTLES DRAWN AEROBIC AND ANAEROBIC ,   Culture NO GROWTH 4 DAYS  Final   Report Status PENDING  Incomplete    Coagulation Studies: No results for input(s): LABPROT, INR in the last 72 hours.  Urinalysis: No results for input(s): COLORURINE, LABSPEC, PHURINE, GLUCOSEU, HGBUR, BILIRUBINUR, KETONESUR, PROTEINUR, UROBILINOGEN, NITRITE, LEUKOCYTESUR in the last 72 hours.  Invalid input(s): APPERANCEUR    Imaging: No results found.   Medications:   . alteplase     . allopurinol  100 mg Oral Daily  . amoxicillin-clavulanate  1 tablet Oral QHS  . antiseptic oral rinse  7 mL Mouth Rinse BID  . aspirin EC  81 mg Oral Daily  . atorvastatin  20 mg Oral QHS  . brimonidine  1 drop Left Eye BID  . clopidogrel  75 mg Oral Daily  . diltiazem  60 mg Oral 3 times per day  . docusate sodium  100 mg Oral BID  . epoetin (EPOGEN/PROCRIT) injection  4,000 Units Intravenous Q T,Th,Sa-HD  . ferrous sulfate  325 mg Oral Daily  . guaiFENesin  600 mg Oral BID  . heparin  5,000 Units Subcutaneous 3 times per day  . latanoprost  1 drop Both Eyes QHS  . lidocaine  1 patch  Transdermal Daily  . lidocaine  1 patch Transdermal Q24H  . megestrol  400 mg Oral BID  . metoprolol tartrate  25 mg Oral BID  . metroNIDAZOLE  500 mg Oral 3 times per day  . pneumococcal 23 valent vaccine  0.5 mL Intramuscular Tomorrow-1000  . pregabalin  75 mg Oral Daily  . sodium chloride flush  3 mL Intravenous Q12H  . tolnaftate  1 application Topical Daily   acetaminophen **OR** acetaminophen, alteplase, benzonatate, midodrine, morphine injection, ondansetron **OR** ondansetron (ZOFRAN) IV  Assessment/ Plan:  82 y.o. black male  with right eye blindness, diastolic congestive heart failure, hypertension, gout, glaucoma, peripheral neuropathy, atrial fibrillation, anemia, carotid stenosis status post bilateral CEA, CVA,  peripheral vascular disease   [facility]/ [facility]/ TTS  1. End Stage Renal Disease: tolerating treatment well. Continue TTS schedule.  - No urgent indication for dialysis today.  We will plan for hemodialysis again tomorrow.   2. Anemia of chronic kidney disease:  Hemoglobin down to 8.5.  Continue Epogen with dialysis.  3. Secondary Hyperparathyroidism:  - continue to monitor bone mineral metabolism parameters during his hospitalization.  4. HTN Blood pressure currently 114/52.  Antihypertensives have been placed on hold with the exception of diltiazem.    LOS: 5 Leily Capek 2/6/20174:24 PM

## 2015-08-15 ENCOUNTER — Encounter: Payer: Self-pay | Admitting: Emergency Medicine

## 2015-08-15 ENCOUNTER — Encounter
Admission: EM | Disposition: A | Discharge status: Home / Self Care | Payer: Self-pay | Source: Home / Self Care | Attending: Emergency Medicine

## 2015-08-15 ENCOUNTER — Emergency Department
Admission: EM | Admit: 2015-08-15 | Discharge: 2015-08-15 | Disposition: A | Discharge status: Home / Self Care | Payer: Medicare (Managed Care) | Attending: Emergency Medicine | Admitting: Emergency Medicine

## 2015-08-15 DIAGNOSIS — T8241XA Breakdown (mechanical) of vascular dialysis catheter, initial encounter: Secondary | ICD-10-CM

## 2015-08-15 DIAGNOSIS — Y658 Other specified misadventures during surgical and medical care: Secondary | ICD-10-CM | POA: Insufficient documentation

## 2015-08-15 DIAGNOSIS — I120 Hypertensive chronic kidney disease with stage 5 chronic kidney disease or end stage renal disease: Secondary | ICD-10-CM | POA: Insufficient documentation

## 2015-08-15 DIAGNOSIS — T8249XA Other complication of vascular dialysis catheter, initial encounter: Secondary | ICD-10-CM | POA: Insufficient documentation

## 2015-08-15 DIAGNOSIS — Z87891 Personal history of nicotine dependence: Secondary | ICD-10-CM | POA: Diagnosis not present

## 2015-08-15 DIAGNOSIS — N186 End stage renal disease: Secondary | ICD-10-CM | POA: Diagnosis not present

## 2015-08-15 DIAGNOSIS — T82518A Breakdown (mechanical) of other cardiac and vascular devices and implants, initial encounter: Secondary | ICD-10-CM

## 2015-08-15 HISTORY — PX: PERIPHERAL VASCULAR CATHETERIZATION: SHX172C

## 2015-08-15 LAB — CBC WITH DIFFERENTIAL/PLATELET
Basophils Absolute: 0.1 10*3/uL (ref 0–0.1)
Basophils Relative: 1 %
EOS ABS: 0.4 10*3/uL (ref 0–0.7)
EOS PCT: 3 %
HCT: 30.3 % — ABNORMAL LOW (ref 40.0–52.0)
HEMOGLOBIN: 9.2 g/dL — AB (ref 13.0–18.0)
LYMPHS PCT: 12 %
Lymphs Abs: 1.3 10*3/uL (ref 1.0–3.6)
MCH: 27.5 pg (ref 26.0–34.0)
MCHC: 30.2 g/dL — AB (ref 32.0–36.0)
MCV: 90.9 fL (ref 80.0–100.0)
MONOS PCT: 8 %
Monocytes Absolute: 0.8 10*3/uL (ref 0.2–1.0)
Neutro Abs: 8.2 10*3/uL — ABNORMAL HIGH (ref 1.4–6.5)
Neutrophils Relative %: 76 %
Platelets: 265 10*3/uL (ref 150–440)
RBC: 3.33 MIL/uL — AB (ref 4.40–5.90)
RDW: 23 % — ABNORMAL HIGH (ref 11.5–14.5)
WBC: 10.9 10*3/uL — AB (ref 3.8–10.6)

## 2015-08-15 LAB — COMPREHENSIVE METABOLIC PANEL
ALK PHOS: 57 U/L (ref 38–126)
ALT: 8 U/L — AB (ref 17–63)
ANION GAP: 7 (ref 5–15)
AST: 14 U/L — ABNORMAL LOW (ref 15–41)
Albumin: 2.2 g/dL — ABNORMAL LOW (ref 3.5–5.0)
BUN: 41 mg/dL — ABNORMAL HIGH (ref 6–20)
CALCIUM: 8.5 mg/dL — AB (ref 8.9–10.3)
CO2: 32 mmol/L (ref 22–32)
CREATININE: 4.97 mg/dL — AB (ref 0.61–1.24)
Chloride: 103 mmol/L (ref 101–111)
GFR, EST AFRICAN AMERICAN: 12 mL/min — AB (ref >60)
GFR, EST NON AFRICAN AMERICAN: 10 mL/min — AB (ref >60)
Glucose, Bld: 129 mg/dL — ABNORMAL HIGH (ref 65–99)
Potassium: 5 mmol/L (ref 3.5–5.1)
SODIUM: 142 mmol/L (ref 135–145)
TOTAL PROTEIN: 7 g/dL (ref 6.5–8.1)
Total Bilirubin: 0.8 mg/dL (ref 0.3–1.2)

## 2015-08-15 LAB — CULTURE, BLOOD (ROUTINE X 2)
CULTURE: NO GROWTH
Culture: NO GROWTH

## 2015-08-15 LAB — TROPONIN I: TROPONIN I: 0.05 ng/mL — AB (ref <0.031)

## 2015-08-15 SURGERY — DIALYSIS/PERMA CATHETER INSERTION
Anesthesia: Moderate Sedation

## 2015-08-15 MED ORDER — SODIUM CHLORIDE 0.9 % IV SOLN
INTRAVENOUS | Status: DC
  Administered 2015-08-15: 15:00:00 via INTRAVENOUS

## 2015-08-15 MED ORDER — FENTANYL CITRATE (PF) 100 MCG/2ML IJ SOLN
INTRAMUSCULAR | Status: AC
  Filled 2015-08-15: qty 2

## 2015-08-15 MED ORDER — HEPARIN SODIUM (PORCINE) 10000 UNIT/ML IJ SOLN
INTRAMUSCULAR | Status: AC
  Filled 2015-08-15: qty 1

## 2015-08-15 MED ORDER — FENTANYL CITRATE (PF) 100 MCG/2ML IJ SOLN
INTRAMUSCULAR | Status: DC | PRN
  Administered 2015-08-15: 50 ug via INTRAVENOUS

## 2015-08-15 MED ORDER — MIDAZOLAM HCL 2 MG/2ML IJ SOLN
INTRAMUSCULAR | Status: AC
  Filled 2015-08-15: qty 2

## 2015-08-15 MED ORDER — MIDAZOLAM HCL 2 MG/2ML IJ SOLN
INTRAMUSCULAR | Status: DC | PRN
  Administered 2015-08-15: 1 mg via INTRAVENOUS

## 2015-08-15 MED ORDER — HEPARIN (PORCINE) IN NACL 2-0.9 UNIT/ML-% IJ SOLN
INTRAMUSCULAR | Status: AC
  Filled 2015-08-15: qty 500

## 2015-08-15 MED ORDER — LIDOCAINE-EPINEPHRINE (PF) 1 %-1:200000 IJ SOLN
INTRAMUSCULAR | Status: AC
  Filled 2015-08-15: qty 30

## 2015-08-15 MED ORDER — CEFAZOLIN SODIUM-DEXTROSE 2-3 GM-% IV SOLR
2.0000 g | INTRAVENOUS | Status: AC
  Administered 2015-08-15: 2 g via INTRAVENOUS
  Filled 2015-08-15: qty 50

## 2015-08-15 SURGICAL SUPPLY — 5 items
CATH PALLINDROM ENE HC 19CM (CATHETERS) ×3 IMPLANT
GUIDEWIRE SUPER STIFF .035X180 (WIRE) ×3 IMPLANT
PACK ANGIOGRAPHY (CUSTOM PROCEDURE TRAY) ×3 IMPLANT
SUT SILK 0 FSL (SUTURE) ×3 IMPLANT
TOWEL OR 17X26 4PK STRL BLUE (TOWEL DISPOSABLE) ×3 IMPLANT

## 2015-08-15 NOTE — ED Notes (Signed)
Consent filled out to be sent upstairs with patient.  Not signed yet.

## 2015-08-15 NOTE — Discharge Instructions (Signed)
, Care After °Refer to this sheet in the next few weeks. These instructions provide you with information on caring for yourself after your procedure. Your caregiver may also give you more specific instructions. Your treatment has been planned according to current medical practices, but problems sometimes occur. Call your caregiver if you have any problems or questions after your procedure.  °HOME CARE INSTRUCTIONS °· Rest at home the day of the procedure. You will likely be able to return to normal activities the following day. °· Follow your caregiver's specific instructions for the type of device that you have. °· Only take over-the-counter or prescription medicines as directed by your caregiver. °· Keep the insertion site of the catheter clean and dry at all times. °¨ Change the bandages (dressings) over the catheter site as directed by your caregiver. °¨ Wash the area around the catheter site during each dressing change. Sponge bathe the area using a germ-killing (antiseptic) solution as directed by your caregiver. °¨ Look for redness or swelling at the insertion site during each dressing change. °· Apply an antibiotic ointment as directed by your caregiver. °· Flush your catheter as directed to keep it from becoming clogged. °· Always wash your hands thoroughly before changing dressings or flushing the catheter. °· Do not let air enter the catheter. °¨ Never open the cap at the catheter tip. °¨ Always make sure there is no air in the syringe or in the tubing for infusions.    °· Do not lift anything heavy. °· Do not drive until your caregiver approves. °· Do not shower or bathe until your caregiver approves. When you shower or bathe, place a piece of plastic wrap over the catheter site. Do not allow the catheter site or the dressing to get wet. If taking a bath, do not allow the catheter to get submerged in the water. °If the catheter was inserted through an arm vein:  °· Avoid wearing tight clothes or jewelry  on the arm that has the catheter.   °· Do not sleep with your head on the arm that has the catheter.   °· Do not allow use of a blood pressure cuff on the arm that has the catheter.   °· Do not let anyone draw blood from the arm that has the catheter, except through the catheter itself. °SEEK MEDICAL CARE IF: °· You have bleeding at the insertion site of the catheter.   °· You feel weak or nauseous.   °· Your catheter is not working properly.   °· You have redness, pain, swelling, and warmth at the insertion site.   °· You notice fluid draining from the insertion site.   °SEEK IMMEDIATE MEDICAL CARE IF: °· Your catheter breaks or has a hole in it.   °· Your catheter comes loose or gets pulled completely out. If this happens, hold firm pressure over the area with your hand or a clean cloth.   °· You have a fever. °· You have chills.   °· Your catheter becomes totally blocked.   °· You have swelling in your arm, shoulder, neck, or face.   °· You have bleeding from the insertion site that does not stop.   °· You develop chest pain or have trouble breathing.   °· You feel dizzy or faint.   °MAKE SURE YOU: °· Understand these instructions. °· Will watch your condition. °· Will get help right away if you are not doing well or get worse. °  °This information is not intended to replace advice given to you by your health care provider. Make sure you discuss any questions you   have with your health care provider. °  °Document Released: 06/10/2012 Document Revised: 02/24/2013 Document Reviewed: 06/10/2012 °Elsevier Interactive Patient Education ©2016 [employer] ° °

## 2015-08-15 NOTE — ED Provider Notes (Signed)
[facility] Emergency Department Provider Note     Time seen: ----------------------------------------- 2:29 PM on 08/15/2015 -----------------------------------------    I have reviewed the triage vital signs and the nursing notes.   HISTORY  Chief Complaint Vascular Access Problem    HPI Sean Delgado. is a 82 y.o. male who presents to ER for dialysis catheter problem. According to the history patient has Tuesday, Thursday, Saturday dialysis. Last dialysis was Saturday and it was normal. Patient went for dialysis today and they could not access his port. Patient denies any complaints.   Past Medical History  Diagnosis Date  . ESRD (end stage renal disease) (HCC)   . Hypertension   . CHF (congestive heart failure) (HCC)   . Gout   . Glaucoma   . Neuropathy (HCC)   . Stroke (HCC)   . Peripheral vascular disease (HCC)   . Coronary artery disease   . A-fib (HCC)   . Dysrhythmia   . Blindness of right eye   . Anemia     iron deficiency    Patient Active Problem List   Diagnosis Date Noted  . HCAP (healthcare-associated pneumonia)   . Aspiration pneumonia of both lungs (HCC)   . Malnutrition of moderate degree 07/25/2015  . Aspiration pneumonia (HCC) 07/22/2015  . Hemodialysis catheter dysfunction (HCC) 06/24/2015  . Pressure ulcer 05/31/2015  . Sepsis (HCC) 05/30/2015  . Hypoxia 05/30/2015  . Acute respiratory failure (HCC) 04/08/2015  . CHF (congestive heart failure) (HCC) 03/04/2015    Past Surgical History  Procedure Laterality Date  . Vascular surgery Bilateral     Carotid Endarterectomy  . Eye surgery Left     Cataract Extraction  . Av fistula placement Left 04/06/2015    Procedure: ARTERIOVENOUS (AV) FISTULA CREATION;  Surgeon: Annice Needy, MD;  Location: [facility] ORS;  Service: Vascular;  Laterality: Left;  . Peripheral vascular catheterization N/A 04/12/2015    Procedure: Dialysis/Perma Catheter Insertion;  Surgeon: Annice Needy,  MD;  Location: [facility] INVASIVE CV LAB;  Service: Cardiovascular;  Laterality: N/A;  . Peripheral vascular catheterization N/A 05/18/2015    Procedure: Dialysis/Perma Catheter Insertion;  Surgeon: Annice Needy, MD;  Location: [facility] INVASIVE CV LAB;  Service: Cardiovascular;  Laterality: N/A;  . Peripheral vascular catheterization Left 05/29/2015    Procedure: A/V Shuntogram/Fistulagram;  Surgeon: Annice Needy, MD;  Location: [facility] INVASIVE CV LAB;  Service: Cardiovascular;  Laterality: Left;  . Peripheral vascular catheterization N/A 05/29/2015    Procedure: A/V Shunt Intervention;  Surgeon: Annice Needy, MD;  Location: [facility] INVASIVE CV LAB;  Service: Cardiovascular;  Laterality: N/A;  . Peripheral vascular catheterization N/A 06/26/2015    Procedure: Dialysis/Perma Catheter Insertion;  Surgeon: Annice Needy, MD;  Location: [facility] INVASIVE CV LAB;  Service: Cardiovascular;  Laterality: N/A;  . Av fistula placement Right 06/29/2015    Procedure: BRACHIAL CEPHALIC AV FISTULA;  Surgeon: Annice Needy, MD;  Location: [facility] ORS;  Service: Vascular;  Laterality: Right;  . Peripheral vascular catheterization N/A 08/02/2015    Procedure: Dialysis/Perma Catheter Insertion;  Surgeon: Renford Dills, MD;  Location: [facility] INVASIVE CV LAB;  Service: Cardiovascular;  Laterality: N/A;    Allergies Review of patient's allergies indicates no known allergies.  Social History Social History  Substance Use Topics  . Smoking status: Former Smoker -- 0.25 packs/day for 15 years    Types: Cigarettes    Quit date: 09/06/2006  . Smokeless tobacco: Never Used  . Alcohol Use: No  Review of Systems Constitutional: Negative for fever. Eyes: Negative for visual changes. ENT: Negative for sore throat. Cardiovascular: Negative for chest pain. Respiratory: Negative for shortness of breath. Gastrointestinal: Negative for abdominal pain, vomiting and diarrhea. Genitourinary: Negative for dysuria. Musculoskeletal: Negative for  back pain. Skin: Negative for rash. Neurological: Negative for headaches, focal weakness or numbness.  10-point ROS otherwise negative.  ____________________________________________   PHYSICAL EXAM:  VITAL SIGNS: ED Triage Vitals  Enc Vitals Group     BP --      Pulse --      Resp --      Temp --      Temp src --      SpO2 --      Weight --      Height --      Head Cir --      Peak Flow --      Pain Score --      Pain Loc --      Pain Edu? --      Excl. in GC? --     Constitutional: Alert and oriented. Well appearing and in no distress. Eyes: Conjunctivae are normal. PERRL. Normal extraocular movements. ENT   Head: Normocephalic and atraumatic.   Nose: No congestion/rhinnorhea.   Mouth/Throat: Mucous membranes are moist.   Neck: No stridor. Cardiovascular: Rapid rate, irregular rhythm. Normal and symmetric distal pulses are present in all extremities. No murmurs, rubs, or gallops. Right chest wall tunneled catheter Respiratory: Normal respiratory effort without tachypnea nor retractions. Breath sounds are clear and equal bilaterally. No wheezes/rales/rhonchi. Gastrointestinal: Soft and nontender. No distention. No abdominal bruits.  Musculoskeletal: Nontender with normal range of motion in all extremities. No joint effusions.  No lower extremity tenderness nor edema. Neurologic:  Normal speech and language. No gross focal neurologic deficits are appreciated.  Skin:  Skin is warm, dry and intact. No rash noted. Psychiatric: Mood and affect are normal.  ___________________________________________  ED COURSE:  Pertinent labs & imaging results that were available during my care of the patient were reviewed by me and considered in my medical decision making (see chart for details). Patient is in no acute distress and without complaint. He has a dialysis access issue. I will discuss with nephrology. ____________________________________________  FINAL  ASSESSMENT AND PLAN  Dialysis catheter dysfunction  Plan: Patient with labs and imaging as dictated above. Patient likely will need admission and to have a new dialysis catheter placed if this catheter continues to be nonfunctioning. I have discussed with vascular surgery on call. The patient is going to special procedures to have a new Vas-Cath placed.   Emily Filbert, MD   Emily Filbert, MD 08/15/15 [phone]

## 2015-08-15 NOTE — ED Notes (Signed)
Consent signed by daughter.   

## 2015-08-15 NOTE — Op Note (Signed)
OPERATIVE NOTE   PROCEDURE: 1. Insertion of tunneled dialysis catheter right IJ approach same venous access.  PRE-OPERATIVE DIAGNOSIS: Complication dialysis device with poor function of the right IJ tunneled catheter  POST-OPERATIVE DIAGNOSIS: Same  SURGEON: Schnier, Latina Craver  ANESTHESIA: Conscious sedation was administered under my direct supervision. IV Versed plus fentanyl were utilized. Continuous ECG, pulse oximetry and blood pressure was monitored throughout the entire procedure.   Conscious sedation for a total of 35 minutes.  ESTIMATED BLOOD LOSS: Minimal cc  CONTRAST USED:  None  FLUOROSCOPY TIME: 1 minutes  INDICATIONS:   Sean Delgado. is a 82 y.o.y.o. male who presents with poor flow and nonfunction of the tunneled dialysis catheter.  Adequate dialysis has not been possible.  DESCRIPTION: After obtaining full informed written consent, the patient was positioned supine. The right neck and chest wall was prepped and draped in a sterile fashion. The cuff is localized and using blunt and sharp dissection it is freed from the surrounding adhesions.  The existing catheter is then transected proximal to the cuff.  The guidewire is advanced without difficulty under fluoroscopy.  Dilators are passed over the wire as needed and the tunneled dialysis catheter is fed into the central venous system without difficulty.  Under fluoroscopy the catheter tip positioned at the atrial caval junction.  Both lumens aspirate and flush easily. After verification of smooth contour with proper tip position under fluoroscopy the catheter is packed with 5000 units of heparin per lumen.  Catheter secured to the skin of the right chest wall with 0 silk. A sterile dressing is applied with a Biopatch.  COMPLICATIONS: None  CONDITION: Sean Delgado, [name] Office:  [phone]   08/15/2015,4:44 PM

## 2015-08-15 NOTE — ED Notes (Signed)
Per EMS:  Clogged dialysis port.  VSS, T, Th, Sat dialysis schedule, last dialysis was Saturday.  VSS.  Airway breathing, circulation intact.

## 2015-08-15 NOTE — Progress Notes (Signed)
Pt transported via EMS. VSS, pt resting comfortably at time of discharge.

## 2015-08-15 NOTE — ED Notes (Addendum)
Talked to Dr. Mayford Knife, some labs still pending.  Specials is ready for patient.   Dr. Mayford Knife states go ahead and transport patient there.

## 2015-08-15 NOTE — ED Notes (Signed)
Lab called for add-on, trop added.

## 2015-08-23 ENCOUNTER — Encounter: Payer: Self-pay | Admitting: Vascular Surgery

## 2015-09-03 ENCOUNTER — Other Ambulatory Visit
Admission: RE | Admit: 2015-09-03 | Discharge: 2015-09-03 | Disposition: A | Discharge status: Home / Self Care | Payer: Medicare (Managed Care) | Source: Ambulatory Visit | Attending: Family Medicine | Admitting: Family Medicine

## 2015-09-03 DIAGNOSIS — N186 End stage renal disease: Secondary | ICD-10-CM | POA: Insufficient documentation

## 2015-09-03 DIAGNOSIS — R05 Cough: Secondary | ICD-10-CM | POA: Insufficient documentation

## 2015-09-03 DIAGNOSIS — A047 Enterocolitis due to Clostridium difficile: Secondary | ICD-10-CM | POA: Diagnosis present

## 2015-09-03 DIAGNOSIS — A419 Sepsis, unspecified organism: Secondary | ICD-10-CM | POA: Insufficient documentation

## 2015-09-03 DIAGNOSIS — M109 Gout, unspecified: Secondary | ICD-10-CM | POA: Insufficient documentation

## 2015-09-03 DIAGNOSIS — G9009 Other idiopathic peripheral autonomic neuropathy: Secondary | ICD-10-CM | POA: Diagnosis present

## 2015-09-03 DIAGNOSIS — I209 Angina pectoris, unspecified: Secondary | ICD-10-CM | POA: Insufficient documentation

## 2015-09-03 DIAGNOSIS — D509 Iron deficiency anemia, unspecified: Secondary | ICD-10-CM | POA: Diagnosis present

## 2015-09-03 DIAGNOSIS — I480 Paroxysmal atrial fibrillation: Secondary | ICD-10-CM | POA: Insufficient documentation

## 2015-09-03 DIAGNOSIS — I5021 Acute systolic (congestive) heart failure: Secondary | ICD-10-CM | POA: Diagnosis present

## 2015-09-03 DIAGNOSIS — Z9981 Dependence on supplemental oxygen: Secondary | ICD-10-CM | POA: Diagnosis present

## 2015-09-03 DIAGNOSIS — D631 Anemia in chronic kidney disease: Secondary | ICD-10-CM | POA: Diagnosis present

## 2015-09-03 DIAGNOSIS — R52 Pain, unspecified: Secondary | ICD-10-CM | POA: Insufficient documentation

## 2015-09-03 DIAGNOSIS — B9689 Other specified bacterial agents as the cause of diseases classified elsewhere: Secondary | ICD-10-CM | POA: Insufficient documentation

## 2015-09-03 DIAGNOSIS — Z7982 Long term (current) use of aspirin: Secondary | ICD-10-CM | POA: Diagnosis present

## 2015-09-03 DIAGNOSIS — K5900 Constipation, unspecified: Secondary | ICD-10-CM | POA: Diagnosis present

## 2015-09-03 DIAGNOSIS — H401120 Primary open-angle glaucoma, left eye, stage unspecified: Secondary | ICD-10-CM | POA: Diagnosis present

## 2015-09-03 DIAGNOSIS — J9600 Acute respiratory failure, unspecified whether with hypoxia or hypercapnia: Secondary | ICD-10-CM | POA: Diagnosis present

## 2015-09-03 DIAGNOSIS — I69959 Hemiplegia and hemiparesis following unspecified cerebrovascular disease affecting unspecified side: Secondary | ICD-10-CM | POA: Diagnosis present

## 2015-09-03 DIAGNOSIS — I129 Hypertensive chronic kidney disease with stage 1 through stage 4 chronic kidney disease, or unspecified chronic kidney disease: Secondary | ICD-10-CM | POA: Insufficient documentation

## 2015-09-03 DIAGNOSIS — Z7902 Long term (current) use of antithrombotics/antiplatelets: Secondary | ICD-10-CM | POA: Diagnosis present

## 2015-09-03 DIAGNOSIS — Z992 Dependence on renal dialysis: Secondary | ICD-10-CM | POA: Insufficient documentation

## 2015-09-03 DIAGNOSIS — R32 Unspecified urinary incontinence: Secondary | ICD-10-CM | POA: Insufficient documentation

## 2015-09-03 DIAGNOSIS — H5441 Blindness, right eye, normal vision left eye: Secondary | ICD-10-CM | POA: Diagnosis present

## 2015-09-03 DIAGNOSIS — I739 Peripheral vascular disease, unspecified: Secondary | ICD-10-CM | POA: Insufficient documentation

## 2015-09-03 DIAGNOSIS — J690 Pneumonitis due to inhalation of food and vomit: Secondary | ICD-10-CM | POA: Insufficient documentation

## 2015-09-03 DIAGNOSIS — F0150 Vascular dementia without behavioral disturbance: Secondary | ICD-10-CM | POA: Diagnosis present

## 2015-09-03 DIAGNOSIS — E785 Hyperlipidemia, unspecified: Secondary | ICD-10-CM | POA: Diagnosis present

## 2015-09-03 LAB — C DIFFICILE QUICK SCREEN W PCR REFLEX
C DIFFICILE (CDIFF) INTERP: POSITIVE
C DIFFICILE (CDIFF) INTERP: POSITIVE
C Diff antigen: POSITIVE — AB
C Diff antigen: POSITIVE — AB
C Diff toxin: POSITIVE — AB
C Diff toxin: POSITIVE — AB

## 2015-09-05 LAB — STOOL CULTURE

## 2015-09-06 LAB — STOOL CULTURE

## 2015-09-07 LAB — WBCS, STOOL
WBCS, STOOL: NONE SEEN
WBCs, Stool: NONE SEEN

## 2015-11-06 DEATH — deceased

## 2016-10-04 IMAGING — NM NM PULMONARY VENT & PERF
2 series · 16 of 16 positions shown · non-contrast
Comparison: Chest radiography same day.

CLINICAL DATA: Shortness of breath acute onset.

EXAM:
NUCLEAR MEDICINE VENTILATION - PERFUSION LUNG SCAN
TECHNIQUE: Ventilation images were obtained in multiple projections using
inhaled aerosol 6c-UUm DTPA. Perfusion images were obtained in
multiple projections after intravenous injection of 6c-UUm MAA.
RADIOPHARMACEUTICALS:  42.855 mCi 7echnetium-33m DTPA aerosol
inhalation and 4.2 7echnetium-33m MAA IV

[Series 1000: ventilation · 3.90mm/px · 4 acquisitions, 8 frames shown]
[im 1/4]
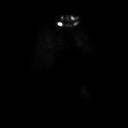
[im 1/4]
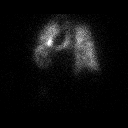
[im 2/4]
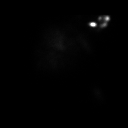
[im 2/4]
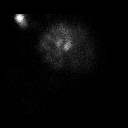
[im 3/4]
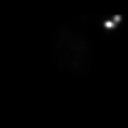
[im 3/4]
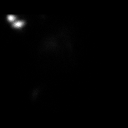
[im 4/4]
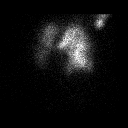
[im 4/4]
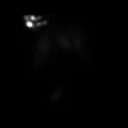

[Series 1000: perfusion · 1.95mm/px · 4 acquisitions, 8 frames shown]
[im 1/4]
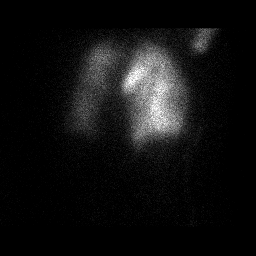
[im 1/4]
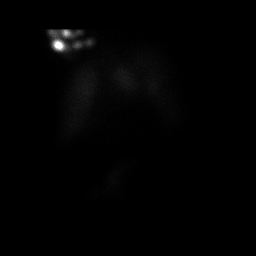
[im 2/4]
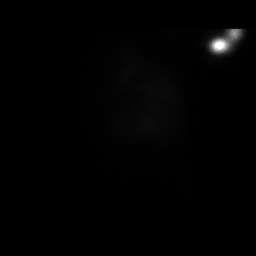
[im 2/4]
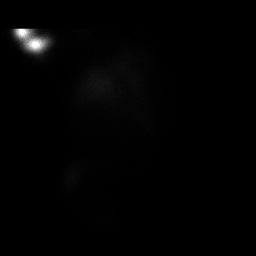
[im 3/4]
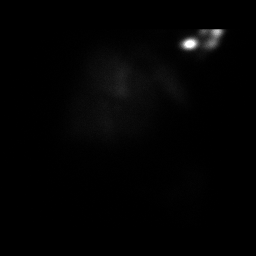
[im 3/4]
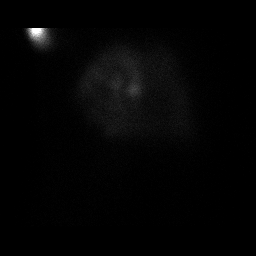
[im 4/4]
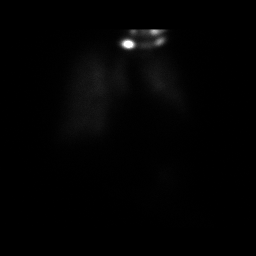
[im 4/4]
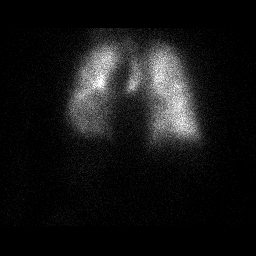

[16 of 16 positions shown; findings below may reference images not displayed]

FINDINGS: Ventilation: Patchy ventilation defects at both lung bases.

Perfusion: Slightly diminished perfusion of the bases consistent
with basilar infiltrates/ atelectasis. No clear cut perfusion
defects to suggest emboli.
IMPRESSION: Low probability of pulmonary emboli. Matched ventilation-perfusion
abnormalities at the lung bases most consistent with atelectasis/
pneumonia.

## 2016-10-04 IMAGING — MR MR HEAD W/O CM
9 of 10 series · 38 of 48 positions shown · non-contrast
Comparison: Head CT without contrast 04/12/2015. Brain MRI
01/03/2009.

CLINICAL DATA: 80-year-old male with respiratory failure,
increasing weakness, left side weakness. Initial encounter.

EXAM:
MRI HEAD WITHOUT CONTRAST
TECHNIQUE: Multiplanar, multiecho pulse sequences of the brain and surrounding
structures were obtained without intravenous contrast.

[Series 2: T1 · sagittal · 5.0mm · 0.36mm/px · 1 of 28 slices shown]
[im 1/28]
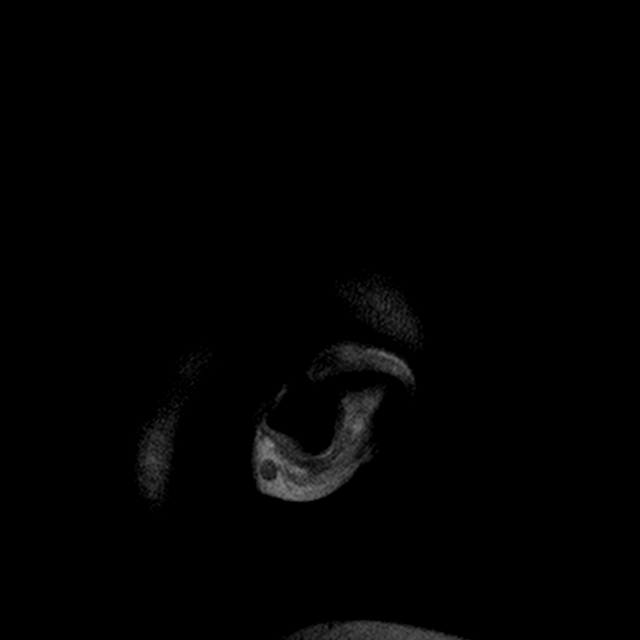

[Series 4: DWI · axial · 4.0mm · 0.94mm/px · z∈[-35,+122]mm · 5 of 45 slices shown (1 of 4)]
[im 1/45]
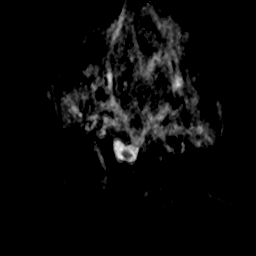
[im 12/45]
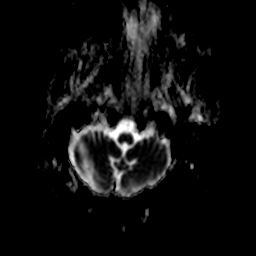
[im 23/45]
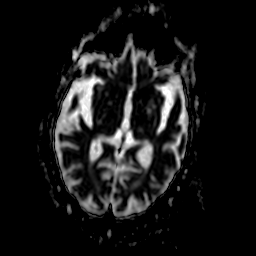
[im 34/45]
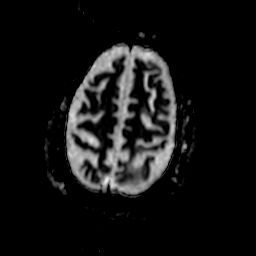
[im 45/45]
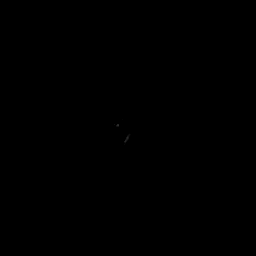

[Series 5: DWI · axial · 4.0mm · 0.94mm/px · z∈[-35,+118]mm · 6 of 44 slices shown (2 of 4)]
[im 1/44]
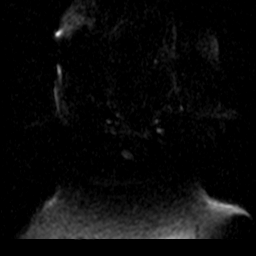
[im 9/44]
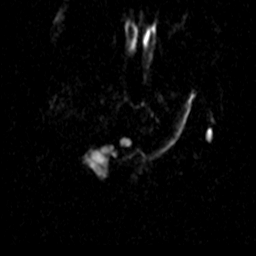
[im 18/44]
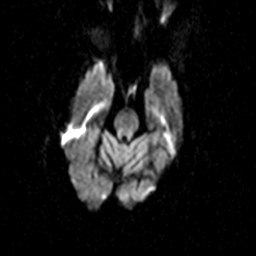
[im 26/44]
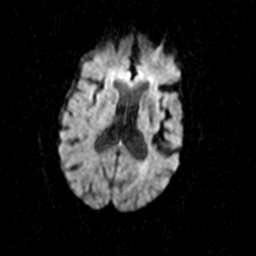
[im 35/44]
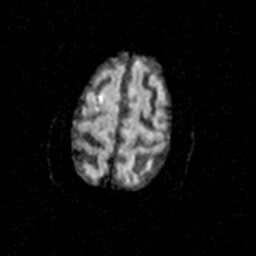
[im 44/44]
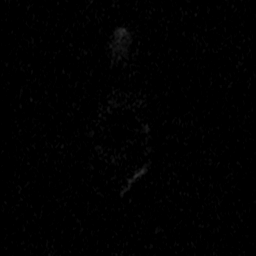

[Series 7: DWI · coronal · 5.0mm · 0.94mm/px · 5 of 41 slices shown (3 of 4)]
[im 1/41]
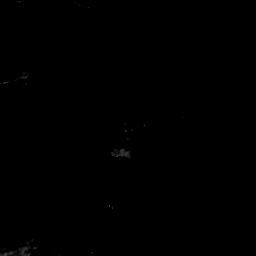
[im 11/41]
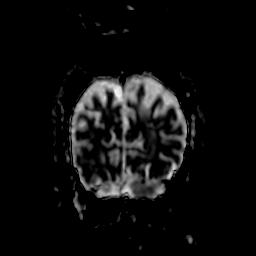
[im 21/41]
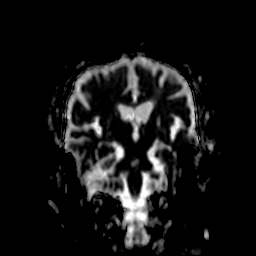
[im 31/41]
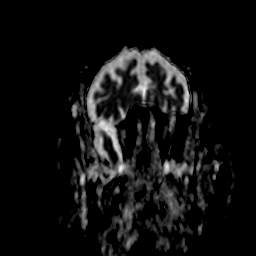
[im 41/41]
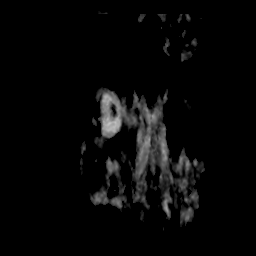

[Series 8: DWI · coronal · 5.0mm · 0.94mm/px · 5 of 40 slices shown (4 of 4)]
[im 1/40]
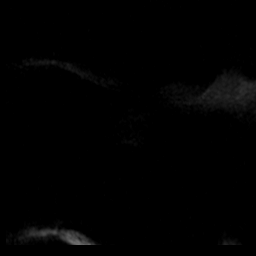
[im 10/40]
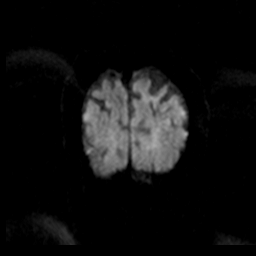
[im 20/40]
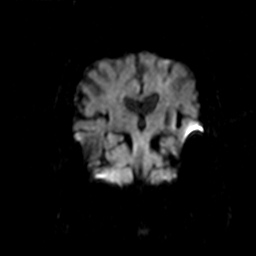
[im 30/40]
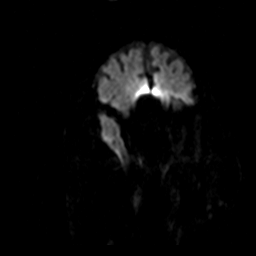
[im 40/40]
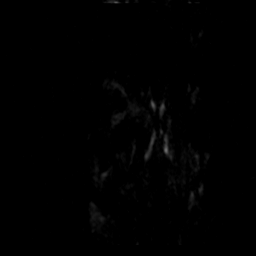

[Series 9: T2 · axial · 5.0mm · 0.68mm/px · z∈[-39,+117]mm · 4 of 28 slices shown (1 of 3)]
[im 1/28]
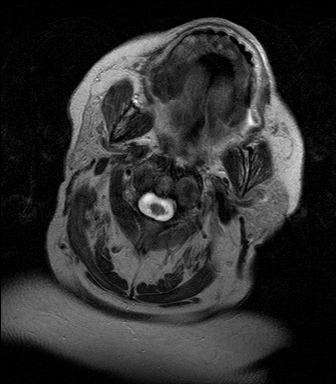
[im 10/28]
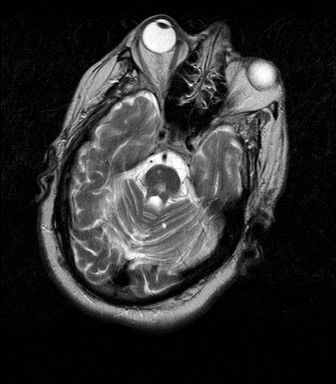
[im 19/28]
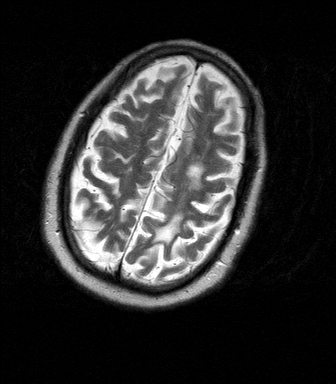
[im 28/28]
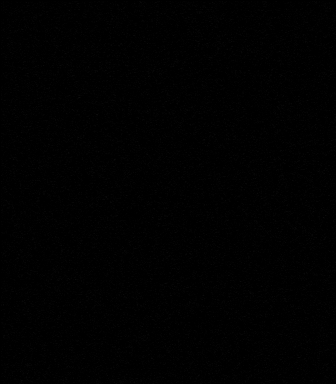

[Series 11: T2 · axial · 5.0mm · 0.45mm/px · z∈[-47,+109]mm · 4 of 28 slices shown (2 of 3)]
[im 1/28]
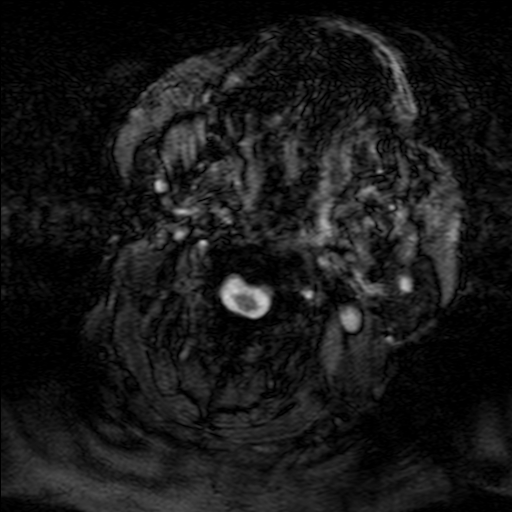
[im 10/28]
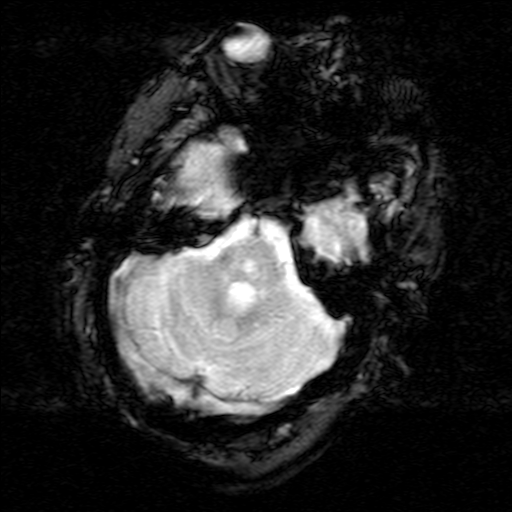
[im 19/28]
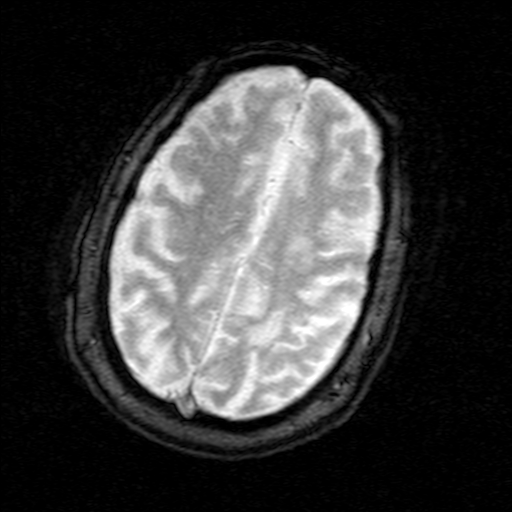
[im 28/28]
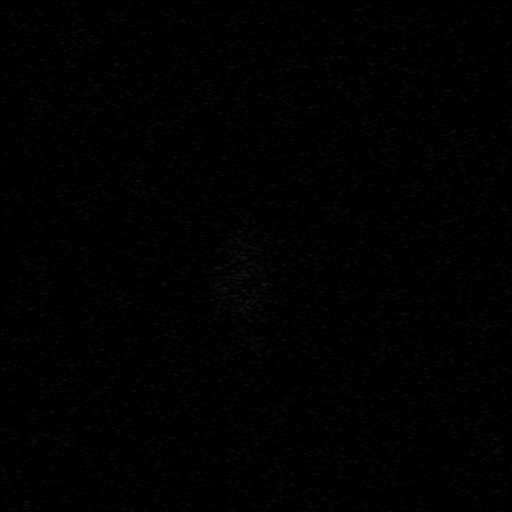

[Series 12: FLAIR · axial · 5.0mm · 0.98mm/px · z∈[-37,+119]mm · 4 of 28 slices shown]
[im 1/28]
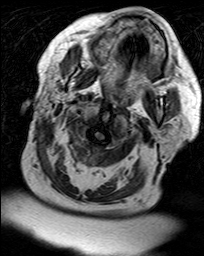
[im 10/28]
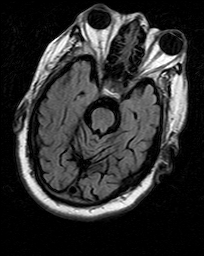
[im 19/28]
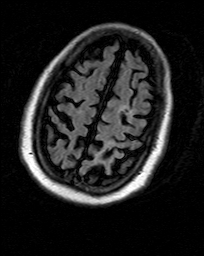
[im 28/28]
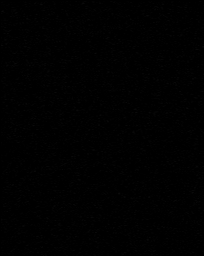

[Series 13: T2 · coronal · 5.0mm · 0.51mm/px · 4 of 32 slices shown (3 of 3)]
[im 1/32]
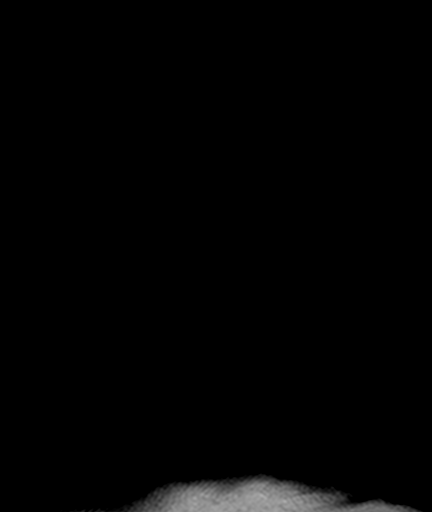
[im 11/32]
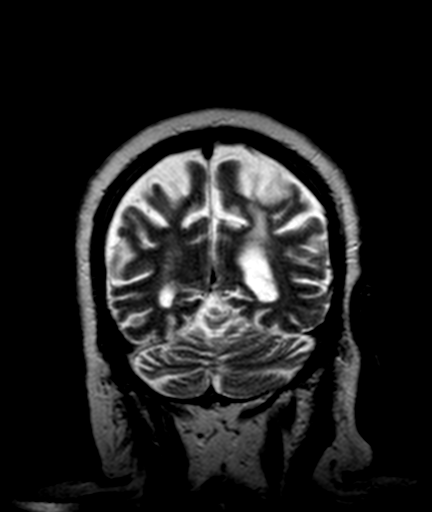
[im 21/32]
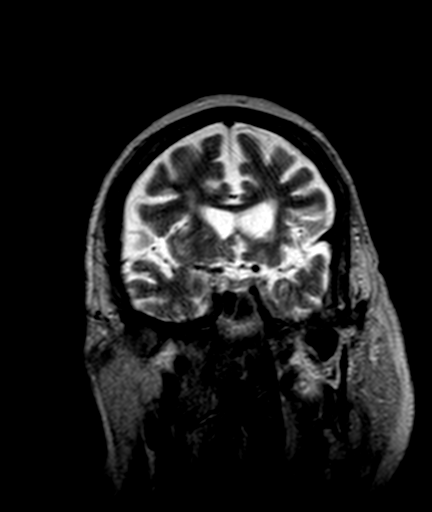
[im 32/32]
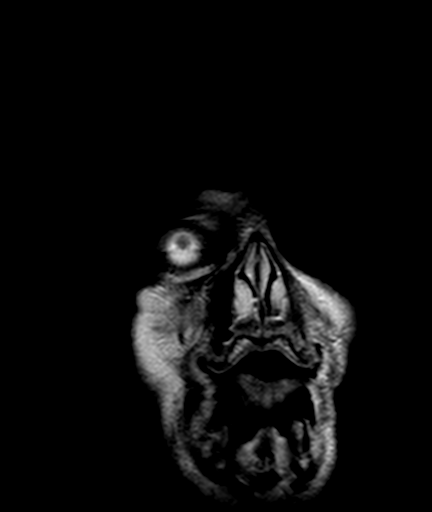

[38 of 48 positions shown; findings below may reference images not displayed]

FINDINGS: There are several subtle areas of cortical restricted diffusion in
the right MCA territory (series 5, image 34) which appear I so to
slightly restricted on ADC. On T2 * imaging there is associated
chronic micro hemorrhage or petechial hemorrhage (series 11, image
21). Mild if any associated cortical T2 and FLAIR hyperintensity in
the right MCA areas implicated on diffusion. No restricted diffusion
elsewhere. Major intracranial vascular flow voids are preserved.

Contralateral chronic cortically based infarct in the posterior left
MCA territory mostly affecting the left parietal lobe as well as
parietal and frontal subcortical white matter. Chronic lacunar
infarcts in both thalami, the cerebellar hemispheres, and moderately
to severely affecting the brainstem at the pons. These chronic
ischemic changes have not significantly progressed since 3595. No
other chronic cerebral hemorrhage identified.

No midline shift, mass effect, evidence of mass lesion,
ventriculomegaly, extra-axial collection or acute intracranial
hemorrhage. Cervicomedullary junction and pituitary are within
normal limits. Visible internal auditory structures appear normal.
Chronic cervical disc and endplate degeneration. Mastoids and
paranasal sinuses are clear. No acute orbit or scalp soft tissue
findings. Normal bone marrow signal.
IMPRESSION: 1. Evidence of small subacute infarcts in the right MCA territory
with petechial hemorrhage. No mass effect. No definite acute
infarct.
2. Chronically advanced underlying left MCA, thalamic, and posterior
fossa ischemic disease otherwise appears stable since [DATE].

## 2016-12-14 IMAGING — CR DG CHEST 1V PORT
1 series · 1 of 1 positions shown · non-contrast
Comparison: 06/04/2015

CLINICAL DATA: 81-year-old male with retracted dialysis catheter.

EXAM:
PORTABLE CHEST 1 VIEW

[ap]
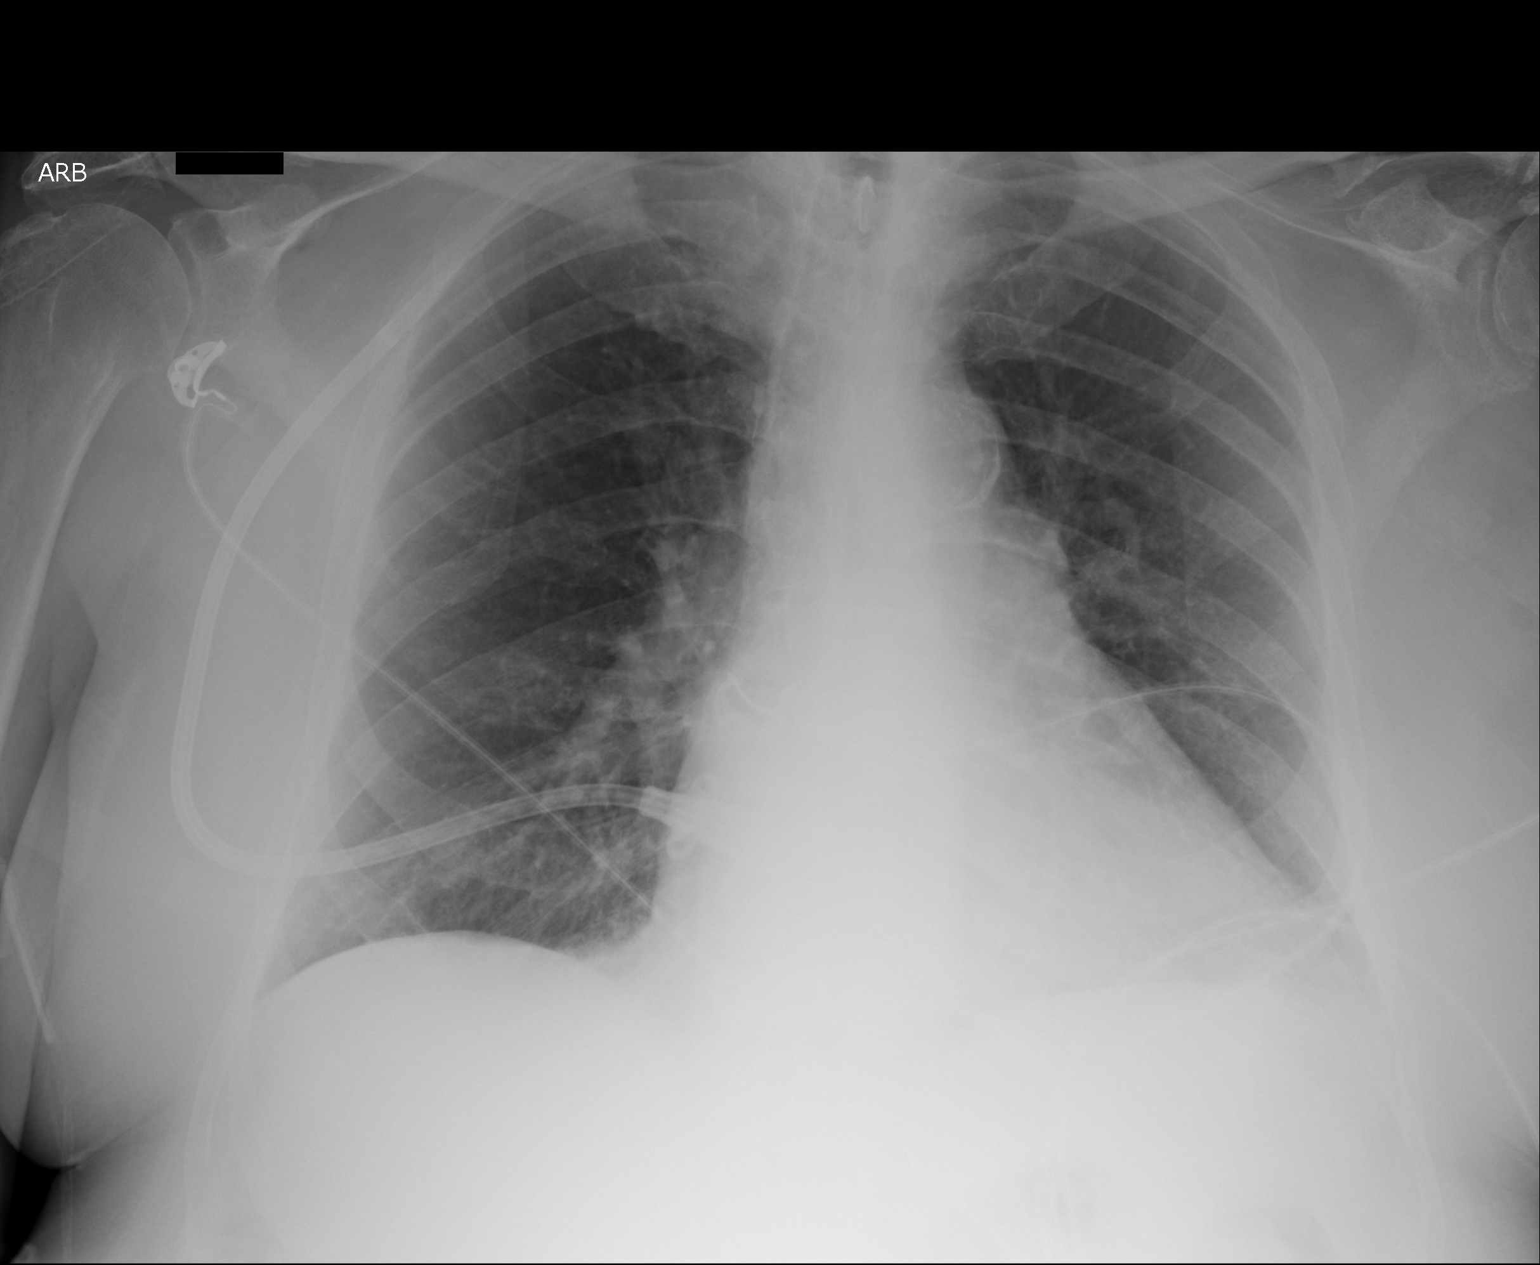

[1 of 1 positions shown; findings below may reference images not displayed]

FINDINGS: The right-sided dialysis catheter has been retracted with tip in the
region of the right subclavian vein.

Cardiomegaly is again noted.

There is no evidence of focal airspace disease, pulmonary edema,
suspicious pulmonary nodule/mass, pleural effusion, or pneumothorax.

No acute bony abnormalities are identified.
IMPRESSION: Retracted right dialysis catheter with tip in the region of the
right subclavian vein.
